# Patient Record
Sex: Male | Born: 1950 | Race: White | Hispanic: No | Marital: Married | Smoking: Current every day smoker
Health system: Southern US, Community
[De-identification: ages and names within clinical notes are randomized; demographics above are authoritative.]

## PROBLEM LIST (undated history)

## (undated) DIAGNOSIS — I509 Heart failure, unspecified: Secondary | ICD-10-CM

## (undated) DIAGNOSIS — I10 Essential (primary) hypertension: Secondary | ICD-10-CM

## (undated) DIAGNOSIS — E7800 Pure hypercholesterolemia, unspecified: Secondary | ICD-10-CM

## (undated) DIAGNOSIS — K219 Gastro-esophageal reflux disease without esophagitis: Secondary | ICD-10-CM

## (undated) DIAGNOSIS — M1990 Unspecified osteoarthritis, unspecified site: Secondary | ICD-10-CM

## (undated) DIAGNOSIS — J45909 Unspecified asthma, uncomplicated: Secondary | ICD-10-CM

## (undated) DIAGNOSIS — E119 Type 2 diabetes mellitus without complications: Secondary | ICD-10-CM

---

## 2014-02-18 DIAGNOSIS — IMO0001 Reserved for inherently not codable concepts without codable children: Secondary | ICD-10-CM | POA: Diagnosis not present

## 2014-02-18 DIAGNOSIS — I10 Essential (primary) hypertension: Secondary | ICD-10-CM | POA: Diagnosis not present

## 2016-04-08 DIAGNOSIS — E1121 Type 2 diabetes mellitus with diabetic nephropathy: Secondary | ICD-10-CM | POA: Diagnosis not present

## 2016-04-08 DIAGNOSIS — I10 Essential (primary) hypertension: Secondary | ICD-10-CM | POA: Diagnosis not present

## 2016-04-08 DIAGNOSIS — J440 Chronic obstructive pulmonary disease with acute lower respiratory infection: Secondary | ICD-10-CM | POA: Diagnosis not present

## 2016-05-10 DIAGNOSIS — R972 Elevated prostate specific antigen [PSA]: Secondary | ICD-10-CM | POA: Diagnosis not present

## 2016-05-13 DIAGNOSIS — M4802 Spinal stenosis, cervical region: Secondary | ICD-10-CM | POA: Diagnosis not present

## 2016-05-13 DIAGNOSIS — M5030 Other cervical disc degeneration, unspecified cervical region: Secondary | ICD-10-CM | POA: Diagnosis not present

## 2016-05-13 DIAGNOSIS — Z79899 Other long term (current) drug therapy: Secondary | ICD-10-CM | POA: Diagnosis not present

## 2016-05-13 DIAGNOSIS — M47812 Spondylosis without myelopathy or radiculopathy, cervical region: Secondary | ICD-10-CM | POA: Diagnosis not present

## 2016-05-13 DIAGNOSIS — J449 Chronic obstructive pulmonary disease, unspecified: Secondary | ICD-10-CM | POA: Diagnosis not present

## 2016-05-20 DIAGNOSIS — E11319 Type 2 diabetes mellitus with unspecified diabetic retinopathy without macular edema: Secondary | ICD-10-CM | POA: Diagnosis not present

## 2016-05-20 DIAGNOSIS — R3915 Urgency of urination: Secondary | ICD-10-CM | POA: Diagnosis not present

## 2016-05-20 DIAGNOSIS — R972 Elevated prostate specific antigen [PSA]: Secondary | ICD-10-CM | POA: Diagnosis not present

## 2016-06-10 DIAGNOSIS — H2513 Age-related nuclear cataract, bilateral: Secondary | ICD-10-CM | POA: Diagnosis not present

## 2016-06-11 DIAGNOSIS — M47812 Spondylosis without myelopathy or radiculopathy, cervical region: Secondary | ICD-10-CM | POA: Diagnosis not present

## 2016-06-11 DIAGNOSIS — M4802 Spinal stenosis, cervical region: Secondary | ICD-10-CM | POA: Diagnosis not present

## 2016-06-11 DIAGNOSIS — Z79899 Other long term (current) drug therapy: Secondary | ICD-10-CM | POA: Diagnosis not present

## 2016-06-11 DIAGNOSIS — J449 Chronic obstructive pulmonary disease, unspecified: Secondary | ICD-10-CM | POA: Diagnosis not present

## 2016-06-11 DIAGNOSIS — E119 Type 2 diabetes mellitus without complications: Secondary | ICD-10-CM | POA: Diagnosis not present

## 2016-06-27 DIAGNOSIS — R972 Elevated prostate specific antigen [PSA]: Secondary | ICD-10-CM | POA: Diagnosis not present

## 2016-07-05 DIAGNOSIS — R972 Elevated prostate specific antigen [PSA]: Secondary | ICD-10-CM | POA: Diagnosis not present

## 2016-07-05 DIAGNOSIS — R3915 Urgency of urination: Secondary | ICD-10-CM | POA: Diagnosis not present

## 2016-07-08 DIAGNOSIS — J45909 Unspecified asthma, uncomplicated: Secondary | ICD-10-CM | POA: Diagnosis not present

## 2016-07-08 DIAGNOSIS — M1990 Unspecified osteoarthritis, unspecified site: Secondary | ICD-10-CM | POA: Diagnosis not present

## 2016-07-08 DIAGNOSIS — Z794 Long term (current) use of insulin: Secondary | ICD-10-CM | POA: Diagnosis not present

## 2016-07-08 DIAGNOSIS — I10 Essential (primary) hypertension: Secondary | ICD-10-CM | POA: Diagnosis not present

## 2016-07-08 DIAGNOSIS — E119 Type 2 diabetes mellitus without complications: Secondary | ICD-10-CM | POA: Diagnosis not present

## 2016-07-08 DIAGNOSIS — Z79899 Other long term (current) drug therapy: Secondary | ICD-10-CM | POA: Diagnosis not present

## 2016-07-08 DIAGNOSIS — M810 Age-related osteoporosis without current pathological fracture: Secondary | ICD-10-CM | POA: Diagnosis not present

## 2016-07-09 ENCOUNTER — Encounter (HOSPITAL_COMMUNITY): Payer: Medicare Other

## 2016-07-12 DIAGNOSIS — I10 Essential (primary) hypertension: Secondary | ICD-10-CM | POA: Diagnosis not present

## 2016-07-12 DIAGNOSIS — E1121 Type 2 diabetes mellitus with diabetic nephropathy: Secondary | ICD-10-CM | POA: Diagnosis not present

## 2016-07-12 DIAGNOSIS — J440 Chronic obstructive pulmonary disease with acute lower respiratory infection: Secondary | ICD-10-CM | POA: Diagnosis not present

## 2016-08-07 NOTE — Patient Instructions (Signed)
Your procedure is scheduled on: 08/16/2016  Report to [facility] at  76  AM.  Call this number if you have problems the morning of surgery: [phone]   Do not eat food or drink liquids :After Midnight.      Take these medicines the morning of surgery with A SIP OF WATER: none   Do not wear jewelry, make-up or nail polish.  Do not wear lotions, powders, or perfumes. You may wear deodorant.  Do not shave 48 hours prior to surgery.  Do not bring valuables to the hospital.  Contacts, dentures or bridgework may not be worn into surgery.  Leave suitcase in the car. After surgery it may be brought to your room.  For patients admitted to the hospital, checkout time is 11:00 AM the day of discharge.   Patients discharged the day of surgery will not be allowed to drive home.  :     Please read over the following fact sheets that you were given: Coughing and Deep Breathing, Surgical Site Infection Prevention, Anesthesia Post-op Instructions and Care and Recovery After Surgery    Cataract A cataract is a clouding of the lens of the eye. When a lens becomes cloudy, vision is reduced based on the degree and nature of the clouding. Many cataracts reduce vision to some degree. Some cataracts make people more near-sighted as they develop. Other cataracts increase glare. Cataracts that are ignored and become worse can sometimes look white. The white color can be seen through the pupil. CAUSES   Aging. However, cataracts may occur at any age, even in newborns.   Certain drugs.   Trauma to the eye.   Certain diseases such as diabetes.   Specific eye diseases such as chronic inflammation inside the eye or a sudden attack of a rare form of glaucoma.   Inherited or acquired medical problems.  SYMPTOMS   Gradual, progressive drop in vision in the affected eye.   Severe, rapid visual loss. This most often happens when trauma is the cause.  DIAGNOSIS  To detect a cataract, an eye doctor examines  the lens. Cataracts are best diagnosed with an exam of the eyes with the pupils enlarged (dilated) by drops.  TREATMENT  For an early cataract, vision may improve by using different eyeglasses or stronger lighting. If that does not help your vision, surgery is the only effective treatment. A cataract needs to be surgically removed when vision loss interferes with your everyday activities, such as driving, reading, or watching TV. A cataract may also have to be removed if it prevents examination or treatment of another eye problem. Surgery removes the cloudy lens and usually replaces it with a substitute lens (intraocular lens, IOL).  At a time when both you and your doctor agree, the cataract will be surgically removed. If you have cataracts in both eyes, only one is usually removed at a time. This allows the operated eye to heal and be out of danger from any possible problems after surgery (such as infection or poor wound healing). In rare cases, a cataract may be doing damage to your eye. In these cases, your caregiver may advise surgical removal right away. The vast majority of people who have cataract surgery have better vision afterward. HOME CARE INSTRUCTIONS  If you are not planning surgery, you may be asked to do the following:  Use different eyeglasses.   Use stronger or brighter lighting.   Ask your eye doctor about reducing your medicine dose or  changing medicines if it is thought that a medicine caused your cataract. Changing medicines does not make the cataract go away on its own.   Become familiar with your surroundings. Poor vision can lead to injury. Avoid bumping into things on the affected side. You are at a higher risk for tripping or falling.   Exercise extreme care when driving or operating machinery.   Wear sunglasses if you are sensitive to bright light or experiencing problems with glare.  SEEK IMMEDIATE MEDICAL CARE IF:   You have a worsening or sudden vision loss.    You notice redness, swelling, or increasing pain in the eye.   You have a fever.  Document Released: 07/15/2005 Document Revised: 07/04/2011 Document Reviewed: 03/08/2011 [facility] Patient Information 2012 [place].PATIENT INSTRUCTIONS POST-ANESTHESIA  IMMEDIATELY FOLLOWING SURGERY:  Do not drive or operate machinery for the first twenty four hours after surgery.  Do not make any important decisions for twenty four hours after surgery or while taking narcotic pain medications or sedatives.  If you develop intractable nausea and vomiting or a severe headache please notify your doctor immediately.  FOLLOW-UP:  Please make an appointment with your surgeon as instructed. You do not need to follow up with anesthesia unless specifically instructed to do so.  WOUND CARE INSTRUCTIONS (if applicable):  Keep a dry clean dressing on the anesthesia/puncture wound site if there is drainage.  Once the wound has quit draining you may leave it open to air.  Generally you should leave the bandage intact for twenty four hours unless there is drainage.  If the epidural site drains for more than 36-48 hours please call the anesthesia department.  QUESTIONS?:  Please feel free to call your physician or the hospital operator if you have any questions, and they will be happy to assist you.

## 2016-08-12 ENCOUNTER — Encounter (HOSPITAL_COMMUNITY): Payer: Self-pay

## 2016-08-12 ENCOUNTER — Encounter (HOSPITAL_COMMUNITY)
Admission: RE | Admit: 2016-08-12 | Discharge: 2016-08-12 | Disposition: A | Discharge status: Home / Self Care | Payer: Medicare Other | Source: Ambulatory Visit | Attending: Ophthalmology | Admitting: Ophthalmology

## 2016-08-12 ENCOUNTER — Other Ambulatory Visit: Payer: Self-pay

## 2016-08-12 DIAGNOSIS — E1121 Type 2 diabetes mellitus with diabetic nephropathy: Secondary | ICD-10-CM | POA: Diagnosis not present

## 2016-08-12 DIAGNOSIS — Z01812 Encounter for preprocedural laboratory examination: Secondary | ICD-10-CM | POA: Diagnosis not present

## 2016-08-12 DIAGNOSIS — Z01810 Encounter for preprocedural cardiovascular examination: Secondary | ICD-10-CM | POA: Diagnosis not present

## 2016-08-12 DIAGNOSIS — J440 Chronic obstructive pulmonary disease with acute lower respiratory infection: Secondary | ICD-10-CM | POA: Diagnosis not present

## 2016-08-12 DIAGNOSIS — I10 Essential (primary) hypertension: Secondary | ICD-10-CM | POA: Diagnosis not present

## 2016-08-12 DIAGNOSIS — I480 Paroxysmal atrial fibrillation: Secondary | ICD-10-CM | POA: Diagnosis not present

## 2016-08-12 HISTORY — DX: Unspecified osteoarthritis, unspecified site: M19.90

## 2016-08-12 HISTORY — DX: Essential (primary) hypertension: I10

## 2016-08-12 HISTORY — DX: Unspecified asthma, uncomplicated: J45.909

## 2016-08-12 HISTORY — DX: Pure hypercholesterolemia, unspecified: E78.00

## 2016-08-12 HISTORY — DX: Gastro-esophageal reflux disease without esophagitis: K21.9

## 2016-08-12 HISTORY — DX: Type 2 diabetes mellitus without complications: E11.9

## 2016-08-12 LAB — BASIC METABOLIC PANEL
ANION GAP: 7 (ref 5–15)
BUN: 21 mg/dL — ABNORMAL HIGH (ref 6–20)
CALCIUM: 9.3 mg/dL (ref 8.9–10.3)
CHLORIDE: 100 mmol/L — AB (ref 101–111)
CO2: 27 mmol/L (ref 22–32)
CREATININE: 1.15 mg/dL (ref 0.61–1.24)
GFR calc non Af Amer: >60 mL/min (ref >60)
GLUCOSE: 141 mg/dL — AB (ref 65–99)
Potassium: 4.1 mmol/L (ref 3.5–5.1)
Sodium: 134 mmol/L — ABNORMAL LOW (ref 135–145)

## 2016-08-12 LAB — CBC
HCT: 39.3 % (ref 39.0–52.0)
HEMOGLOBIN: 12.5 g/dL — AB (ref 13.0–17.0)
MCH: 20.5 pg — ABNORMAL LOW (ref 26.0–34.0)
MCHC: 31.8 g/dL (ref 30.0–36.0)
MCV: 64.3 fL — AB (ref 78.0–100.0)
Platelets: 253 10*3/uL (ref 150–400)
RBC: 6.11 MIL/uL — ABNORMAL HIGH (ref 4.22–5.81)
RDW: 16 % — AB (ref 11.5–15.5)
WBC: 9.9 10*3/uL (ref 4.0–10.5)

## 2016-08-12 NOTE — Pre-Procedure Instructions (Signed)
Patient in for PAT. 98.5-113-118 HR, 20 RR, BP 123/80. Sat 97%. Patient denies SOB or feeling bad or different at all. EKG shows AFib with rapid ventricular response rate of 114. Patient is not aware of this from past visits with PCP and is currently on no meds to treat this. EKG show to Dr Patsey Berthold who states patient needs to be seen and treated prior to surgery. Called PCP, Dr Sherrie Sport who wants patient to come to office right now. Patient  verbalizes understanding of AFib and reasons for needing treatment as well as cancelling surgery for now. Copies of today's EKG given to patient t to take to PCP. He understands to call Dr Bernarda Caffey office back once he has been treated for this and PCP clears him to have surgery. Marylin Crosby at Dr Bernarda Caffey office left message on voicemail about these events and left my name and number for him to call us.

## 2016-08-16 ENCOUNTER — Encounter (HOSPITAL_COMMUNITY): Admission: RE | Payer: Self-pay | Source: Ambulatory Visit

## 2016-08-16 ENCOUNTER — Ambulatory Visit (HOSPITAL_COMMUNITY): Admission: RE | Admit: 2016-08-16 | Payer: Medicare Other | Source: Ambulatory Visit | Admitting: Ophthalmology

## 2016-08-16 SURGERY — PHACOEMULSIFICATION, CATARACT, WITH IOL INSERTION
Anesthesia: Monitor Anesthesia Care | Laterality: Right

## 2016-08-19 DIAGNOSIS — I517 Cardiomegaly: Secondary | ICD-10-CM | POA: Diagnosis not present

## 2016-08-19 DIAGNOSIS — I480 Paroxysmal atrial fibrillation: Secondary | ICD-10-CM | POA: Diagnosis not present

## 2016-08-19 DIAGNOSIS — I340 Nonrheumatic mitral (valve) insufficiency: Secondary | ICD-10-CM | POA: Diagnosis not present

## 2016-08-26 DIAGNOSIS — I480 Paroxysmal atrial fibrillation: Secondary | ICD-10-CM | POA: Diagnosis not present

## 2016-08-27 ENCOUNTER — Other Ambulatory Visit (HOSPITAL_COMMUNITY): Payer: Medicare Other

## 2016-08-30 ENCOUNTER — Ambulatory Visit (HOSPITAL_COMMUNITY): Admission: RE | Admit: 2016-08-30 | Payer: Medicare Other | Source: Ambulatory Visit | Admitting: Ophthalmology

## 2016-08-30 ENCOUNTER — Encounter (HOSPITAL_COMMUNITY): Admission: RE | Payer: Self-pay | Source: Ambulatory Visit

## 2016-08-30 SURGERY — PHACOEMULSIFICATION, CATARACT, WITH IOL INSERTION
Anesthesia: Monitor Anesthesia Care | Laterality: Left

## 2016-09-09 DIAGNOSIS — M4802 Spinal stenosis, cervical region: Secondary | ICD-10-CM | POA: Diagnosis not present

## 2016-09-09 DIAGNOSIS — M47812 Spondylosis without myelopathy or radiculopathy, cervical region: Secondary | ICD-10-CM | POA: Diagnosis not present

## 2016-09-09 DIAGNOSIS — Z79899 Other long term (current) drug therapy: Secondary | ICD-10-CM | POA: Diagnosis not present

## 2016-09-09 DIAGNOSIS — J449 Chronic obstructive pulmonary disease, unspecified: Secondary | ICD-10-CM | POA: Diagnosis not present

## 2016-11-25 DIAGNOSIS — I872 Venous insufficiency (chronic) (peripheral): Secondary | ICD-10-CM | POA: Diagnosis not present

## 2016-11-25 DIAGNOSIS — Z0000 Encounter for general adult medical examination without abnormal findings: Secondary | ICD-10-CM | POA: Diagnosis not present

## 2016-11-25 DIAGNOSIS — Z1389 Encounter for screening for other disorder: Secondary | ICD-10-CM | POA: Diagnosis not present

## 2016-11-25 DIAGNOSIS — I10 Essential (primary) hypertension: Secondary | ICD-10-CM | POA: Diagnosis not present

## 2016-11-25 DIAGNOSIS — I480 Paroxysmal atrial fibrillation: Secondary | ICD-10-CM | POA: Diagnosis not present

## 2016-11-25 DIAGNOSIS — E1121 Type 2 diabetes mellitus with diabetic nephropathy: Secondary | ICD-10-CM | POA: Diagnosis not present

## 2016-11-25 DIAGNOSIS — J440 Chronic obstructive pulmonary disease with acute lower respiratory infection: Secondary | ICD-10-CM | POA: Diagnosis not present

## 2016-12-09 DIAGNOSIS — J449 Chronic obstructive pulmonary disease, unspecified: Secondary | ICD-10-CM | POA: Diagnosis not present

## 2016-12-09 DIAGNOSIS — M4802 Spinal stenosis, cervical region: Secondary | ICD-10-CM | POA: Diagnosis not present

## 2016-12-09 DIAGNOSIS — M47812 Spondylosis without myelopathy or radiculopathy, cervical region: Secondary | ICD-10-CM | POA: Diagnosis not present

## 2016-12-09 DIAGNOSIS — E119 Type 2 diabetes mellitus without complications: Secondary | ICD-10-CM | POA: Diagnosis not present

## 2016-12-09 DIAGNOSIS — Z79899 Other long term (current) drug therapy: Secondary | ICD-10-CM | POA: Diagnosis not present

## 2017-02-25 DIAGNOSIS — I480 Paroxysmal atrial fibrillation: Secondary | ICD-10-CM | POA: Diagnosis not present

## 2017-02-25 DIAGNOSIS — Z0000 Encounter for general adult medical examination without abnormal findings: Secondary | ICD-10-CM | POA: Diagnosis not present

## 2017-02-25 DIAGNOSIS — Z125 Encounter for screening for malignant neoplasm of prostate: Secondary | ICD-10-CM | POA: Diagnosis not present

## 2017-02-25 DIAGNOSIS — E1121 Type 2 diabetes mellitus with diabetic nephropathy: Secondary | ICD-10-CM | POA: Diagnosis not present

## 2017-02-25 DIAGNOSIS — Z79899 Other long term (current) drug therapy: Secondary | ICD-10-CM | POA: Diagnosis not present

## 2017-02-25 DIAGNOSIS — J440 Chronic obstructive pulmonary disease with acute lower respiratory infection: Secondary | ICD-10-CM | POA: Diagnosis not present

## 2017-02-25 DIAGNOSIS — I10 Essential (primary) hypertension: Secondary | ICD-10-CM | POA: Diagnosis not present

## 2017-03-06 DIAGNOSIS — Z79899 Other long term (current) drug therapy: Secondary | ICD-10-CM | POA: Diagnosis not present

## 2017-03-06 DIAGNOSIS — M4802 Spinal stenosis, cervical region: Secondary | ICD-10-CM | POA: Diagnosis not present

## 2017-03-06 DIAGNOSIS — M47812 Spondylosis without myelopathy or radiculopathy, cervical region: Secondary | ICD-10-CM | POA: Diagnosis not present

## 2017-05-27 DIAGNOSIS — J440 Chronic obstructive pulmonary disease with acute lower respiratory infection: Secondary | ICD-10-CM | POA: Diagnosis not present

## 2017-05-27 DIAGNOSIS — I480 Paroxysmal atrial fibrillation: Secondary | ICD-10-CM | POA: Diagnosis not present

## 2017-05-27 DIAGNOSIS — I10 Essential (primary) hypertension: Secondary | ICD-10-CM | POA: Diagnosis not present

## 2017-05-27 DIAGNOSIS — E1121 Type 2 diabetes mellitus with diabetic nephropathy: Secondary | ICD-10-CM | POA: Diagnosis not present

## 2017-06-05 DIAGNOSIS — M47812 Spondylosis without myelopathy or radiculopathy, cervical region: Secondary | ICD-10-CM | POA: Diagnosis not present

## 2017-06-05 DIAGNOSIS — Z79899 Other long term (current) drug therapy: Secondary | ICD-10-CM | POA: Diagnosis not present

## 2017-08-27 DIAGNOSIS — E782 Mixed hyperlipidemia: Secondary | ICD-10-CM | POA: Diagnosis not present

## 2017-08-27 DIAGNOSIS — I480 Paroxysmal atrial fibrillation: Secondary | ICD-10-CM | POA: Diagnosis not present

## 2017-08-27 DIAGNOSIS — J440 Chronic obstructive pulmonary disease with acute lower respiratory infection: Secondary | ICD-10-CM | POA: Diagnosis not present

## 2017-08-27 DIAGNOSIS — E1165 Type 2 diabetes mellitus with hyperglycemia: Secondary | ICD-10-CM | POA: Diagnosis not present

## 2017-08-27 DIAGNOSIS — I10 Essential (primary) hypertension: Secondary | ICD-10-CM | POA: Diagnosis not present

## 2017-08-27 DIAGNOSIS — E1121 Type 2 diabetes mellitus with diabetic nephropathy: Secondary | ICD-10-CM | POA: Diagnosis not present

## 2017-08-27 DIAGNOSIS — J441 Chronic obstructive pulmonary disease with (acute) exacerbation: Secondary | ICD-10-CM | POA: Diagnosis not present

## 2017-08-27 DIAGNOSIS — Z125 Encounter for screening for malignant neoplasm of prostate: Secondary | ICD-10-CM | POA: Diagnosis not present

## 2017-09-01 DIAGNOSIS — M4802 Spinal stenosis, cervical region: Secondary | ICD-10-CM | POA: Diagnosis not present

## 2017-09-01 DIAGNOSIS — Z79899 Other long term (current) drug therapy: Secondary | ICD-10-CM | POA: Diagnosis not present

## 2017-09-01 DIAGNOSIS — M47812 Spondylosis without myelopathy or radiculopathy, cervical region: Secondary | ICD-10-CM | POA: Diagnosis not present

## 2017-09-04 DIAGNOSIS — H2513 Age-related nuclear cataract, bilateral: Secondary | ICD-10-CM | POA: Diagnosis not present

## 2017-10-01 DIAGNOSIS — H25013 Cortical age-related cataract, bilateral: Secondary | ICD-10-CM | POA: Diagnosis not present

## 2017-10-01 DIAGNOSIS — H40033 Anatomical narrow angle, bilateral: Secondary | ICD-10-CM | POA: Diagnosis not present

## 2017-10-01 DIAGNOSIS — H40013 Open angle with borderline findings, low risk, bilateral: Secondary | ICD-10-CM | POA: Diagnosis not present

## 2017-10-01 DIAGNOSIS — H2512 Age-related nuclear cataract, left eye: Secondary | ICD-10-CM | POA: Diagnosis not present

## 2017-10-01 DIAGNOSIS — H2513 Age-related nuclear cataract, bilateral: Secondary | ICD-10-CM | POA: Diagnosis not present

## 2017-10-21 DIAGNOSIS — H2512 Age-related nuclear cataract, left eye: Secondary | ICD-10-CM | POA: Diagnosis not present

## 2017-10-21 DIAGNOSIS — H21562 Pupillary abnormality, left eye: Secondary | ICD-10-CM | POA: Diagnosis not present

## 2017-10-21 DIAGNOSIS — H5703 Miosis: Secondary | ICD-10-CM | POA: Diagnosis not present

## 2017-10-21 DIAGNOSIS — H25812 Combined forms of age-related cataract, left eye: Secondary | ICD-10-CM | POA: Diagnosis not present

## 2017-11-07 DIAGNOSIS — I700 Atherosclerosis of aorta: Secondary | ICD-10-CM | POA: Diagnosis not present

## 2017-11-07 DIAGNOSIS — J441 Chronic obstructive pulmonary disease with (acute) exacerbation: Secondary | ICD-10-CM | POA: Diagnosis not present

## 2017-11-07 DIAGNOSIS — I517 Cardiomegaly: Secondary | ICD-10-CM | POA: Diagnosis not present

## 2017-11-07 DIAGNOSIS — I2510 Atherosclerotic heart disease of native coronary artery without angina pectoris: Secondary | ICD-10-CM | POA: Diagnosis not present

## 2017-11-07 DIAGNOSIS — R0602 Shortness of breath: Secondary | ICD-10-CM | POA: Diagnosis not present

## 2017-11-10 DIAGNOSIS — H2511 Age-related nuclear cataract, right eye: Secondary | ICD-10-CM | POA: Diagnosis not present

## 2017-11-10 DIAGNOSIS — H25011 Cortical age-related cataract, right eye: Secondary | ICD-10-CM | POA: Diagnosis not present

## 2017-11-13 DIAGNOSIS — R0602 Shortness of breath: Secondary | ICD-10-CM | POA: Diagnosis not present

## 2017-11-18 DIAGNOSIS — H25811 Combined forms of age-related cataract, right eye: Secondary | ICD-10-CM | POA: Diagnosis not present

## 2017-11-18 DIAGNOSIS — H2511 Age-related nuclear cataract, right eye: Secondary | ICD-10-CM | POA: Diagnosis not present

## 2017-11-18 DIAGNOSIS — H21561 Pupillary abnormality, right eye: Secondary | ICD-10-CM | POA: Diagnosis not present

## 2017-11-18 DIAGNOSIS — H5703 Miosis: Secondary | ICD-10-CM | POA: Diagnosis not present

## 2017-12-04 DIAGNOSIS — M47812 Spondylosis without myelopathy or radiculopathy, cervical region: Secondary | ICD-10-CM | POA: Diagnosis not present

## 2017-12-04 DIAGNOSIS — Z79899 Other long term (current) drug therapy: Secondary | ICD-10-CM | POA: Diagnosis not present

## 2017-12-04 DIAGNOSIS — M4802 Spinal stenosis, cervical region: Secondary | ICD-10-CM | POA: Diagnosis not present

## 2017-12-04 DIAGNOSIS — M5030 Other cervical disc degeneration, unspecified cervical region: Secondary | ICD-10-CM | POA: Diagnosis not present

## 2017-12-09 DIAGNOSIS — J441 Chronic obstructive pulmonary disease with (acute) exacerbation: Secondary | ICD-10-CM | POA: Diagnosis not present

## 2017-12-09 DIAGNOSIS — Z1389 Encounter for screening for other disorder: Secondary | ICD-10-CM | POA: Diagnosis not present

## 2017-12-09 DIAGNOSIS — I10 Essential (primary) hypertension: Secondary | ICD-10-CM | POA: Diagnosis not present

## 2017-12-09 DIAGNOSIS — Z0000 Encounter for general adult medical examination without abnormal findings: Secondary | ICD-10-CM | POA: Diagnosis not present

## 2017-12-09 DIAGNOSIS — E1121 Type 2 diabetes mellitus with diabetic nephropathy: Secondary | ICD-10-CM | POA: Diagnosis not present

## 2018-01-11 DIAGNOSIS — Z794 Long term (current) use of insulin: Secondary | ICD-10-CM | POA: Diagnosis not present

## 2018-01-11 DIAGNOSIS — J45909 Unspecified asthma, uncomplicated: Secondary | ICD-10-CM | POA: Diagnosis not present

## 2018-01-11 DIAGNOSIS — W57XXXA Bitten or stung by nonvenomous insect and other nonvenomous arthropods, initial encounter: Secondary | ICD-10-CM | POA: Diagnosis not present

## 2018-01-11 DIAGNOSIS — I10 Essential (primary) hypertension: Secondary | ICD-10-CM | POA: Diagnosis not present

## 2018-01-11 DIAGNOSIS — K219 Gastro-esophageal reflux disease without esophagitis: Secondary | ICD-10-CM | POA: Diagnosis not present

## 2018-01-11 DIAGNOSIS — Z79899 Other long term (current) drug therapy: Secondary | ICD-10-CM | POA: Diagnosis not present

## 2018-01-11 DIAGNOSIS — E119 Type 2 diabetes mellitus without complications: Secondary | ICD-10-CM | POA: Diagnosis not present

## 2018-04-02 DIAGNOSIS — M5030 Other cervical disc degeneration, unspecified cervical region: Secondary | ICD-10-CM | POA: Diagnosis not present

## 2018-04-02 DIAGNOSIS — M4802 Spinal stenosis, cervical region: Secondary | ICD-10-CM | POA: Diagnosis not present

## 2018-04-02 DIAGNOSIS — M47812 Spondylosis without myelopathy or radiculopathy, cervical region: Secondary | ICD-10-CM | POA: Diagnosis not present

## 2018-04-08 DIAGNOSIS — E1121 Type 2 diabetes mellitus with diabetic nephropathy: Secondary | ICD-10-CM | POA: Diagnosis not present

## 2018-04-08 DIAGNOSIS — I10 Essential (primary) hypertension: Secondary | ICD-10-CM | POA: Diagnosis not present

## 2018-04-08 DIAGNOSIS — Z0000 Encounter for general adult medical examination without abnormal findings: Secondary | ICD-10-CM | POA: Diagnosis not present

## 2018-04-08 DIAGNOSIS — J449 Chronic obstructive pulmonary disease, unspecified: Secondary | ICD-10-CM | POA: Diagnosis not present

## 2018-06-30 DIAGNOSIS — M4802 Spinal stenosis, cervical region: Secondary | ICD-10-CM | POA: Diagnosis not present

## 2018-06-30 DIAGNOSIS — Z79899 Other long term (current) drug therapy: Secondary | ICD-10-CM | POA: Diagnosis not present

## 2018-06-30 DIAGNOSIS — M47812 Spondylosis without myelopathy or radiculopathy, cervical region: Secondary | ICD-10-CM | POA: Diagnosis not present

## 2018-07-20 DIAGNOSIS — N182 Chronic kidney disease, stage 2 (mild): Secondary | ICD-10-CM | POA: Diagnosis not present

## 2018-07-20 DIAGNOSIS — E1121 Type 2 diabetes mellitus with diabetic nephropathy: Secondary | ICD-10-CM | POA: Diagnosis not present

## 2018-07-20 DIAGNOSIS — I10 Essential (primary) hypertension: Secondary | ICD-10-CM | POA: Diagnosis not present

## 2018-07-20 DIAGNOSIS — J449 Chronic obstructive pulmonary disease, unspecified: Secondary | ICD-10-CM | POA: Diagnosis not present

## 2018-10-12 DIAGNOSIS — M5030 Other cervical disc degeneration, unspecified cervical region: Secondary | ICD-10-CM | POA: Diagnosis not present

## 2018-10-12 DIAGNOSIS — Z79899 Other long term (current) drug therapy: Secondary | ICD-10-CM | POA: Diagnosis not present

## 2018-10-12 DIAGNOSIS — M47812 Spondylosis without myelopathy or radiculopathy, cervical region: Secondary | ICD-10-CM | POA: Diagnosis not present

## 2018-10-12 DIAGNOSIS — M4802 Spinal stenosis, cervical region: Secondary | ICD-10-CM | POA: Diagnosis not present

## 2018-11-16 DIAGNOSIS — E1121 Type 2 diabetes mellitus with diabetic nephropathy: Secondary | ICD-10-CM | POA: Diagnosis not present

## 2018-11-16 DIAGNOSIS — N182 Chronic kidney disease, stage 2 (mild): Secondary | ICD-10-CM | POA: Diagnosis not present

## 2018-11-16 DIAGNOSIS — I10 Essential (primary) hypertension: Secondary | ICD-10-CM | POA: Diagnosis not present

## 2018-11-16 DIAGNOSIS — Z1389 Encounter for screening for other disorder: Secondary | ICD-10-CM | POA: Diagnosis not present

## 2018-11-16 DIAGNOSIS — Z0000 Encounter for general adult medical examination without abnormal findings: Secondary | ICD-10-CM | POA: Diagnosis not present

## 2018-11-16 DIAGNOSIS — J449 Chronic obstructive pulmonary disease, unspecified: Secondary | ICD-10-CM | POA: Diagnosis not present

## 2019-01-07 DIAGNOSIS — J449 Chronic obstructive pulmonary disease, unspecified: Secondary | ICD-10-CM | POA: Diagnosis not present

## 2019-01-07 DIAGNOSIS — M47812 Spondylosis without myelopathy or radiculopathy, cervical region: Secondary | ICD-10-CM | POA: Diagnosis not present

## 2019-01-07 DIAGNOSIS — M4802 Spinal stenosis, cervical region: Secondary | ICD-10-CM | POA: Diagnosis not present

## 2019-01-07 DIAGNOSIS — M5030 Other cervical disc degeneration, unspecified cervical region: Secondary | ICD-10-CM | POA: Diagnosis not present

## 2019-03-09 DIAGNOSIS — I10 Essential (primary) hypertension: Secondary | ICD-10-CM | POA: Diagnosis not present

## 2019-03-09 DIAGNOSIS — N182 Chronic kidney disease, stage 2 (mild): Secondary | ICD-10-CM | POA: Diagnosis not present

## 2019-03-09 DIAGNOSIS — Z0000 Encounter for general adult medical examination without abnormal findings: Secondary | ICD-10-CM | POA: Diagnosis not present

## 2019-03-09 DIAGNOSIS — E1121 Type 2 diabetes mellitus with diabetic nephropathy: Secondary | ICD-10-CM | POA: Diagnosis not present

## 2019-03-09 DIAGNOSIS — J449 Chronic obstructive pulmonary disease, unspecified: Secondary | ICD-10-CM | POA: Diagnosis not present

## 2019-04-06 ENCOUNTER — Other Ambulatory Visit: Payer: Self-pay

## 2019-04-06 NOTE — Patient Outreach (Signed)
[facility]) Care Management  04/06/2019  Perry Hunter 1950/10/05 [id number]   Medication Adherence call to [name] Hippa Identifiers Verify spoke with patient he is past due on Benazepril 40 mg patient said he does not have any left but did not know if he is sopposed to be on this medication call Doctors office they said patient is to continue taking this medication that they will call a refill to [facility] left a massage for patient to pick up the medication from [place]. Mr. Ferrando is showing past due under [place].   [facility] Direct Dial [phone]  Fax [phone] [email or web address]

## 2019-04-08 DIAGNOSIS — M47812 Spondylosis without myelopathy or radiculopathy, cervical region: Secondary | ICD-10-CM | POA: Diagnosis not present

## 2019-04-08 DIAGNOSIS — M4802 Spinal stenosis, cervical region: Secondary | ICD-10-CM | POA: Diagnosis not present

## 2019-04-08 DIAGNOSIS — M5030 Other cervical disc degeneration, unspecified cervical region: Secondary | ICD-10-CM | POA: Diagnosis not present

## 2019-06-09 DIAGNOSIS — I10 Essential (primary) hypertension: Secondary | ICD-10-CM | POA: Diagnosis not present

## 2019-06-09 DIAGNOSIS — J449 Chronic obstructive pulmonary disease, unspecified: Secondary | ICD-10-CM | POA: Diagnosis not present

## 2019-06-09 DIAGNOSIS — N182 Chronic kidney disease, stage 2 (mild): Secondary | ICD-10-CM | POA: Diagnosis not present

## 2019-06-09 DIAGNOSIS — E1121 Type 2 diabetes mellitus with diabetic nephropathy: Secondary | ICD-10-CM | POA: Diagnosis not present

## 2019-06-09 DIAGNOSIS — L0390 Cellulitis, unspecified: Secondary | ICD-10-CM | POA: Diagnosis not present

## 2019-07-05 DIAGNOSIS — M47812 Spondylosis without myelopathy or radiculopathy, cervical region: Secondary | ICD-10-CM | POA: Diagnosis not present

## 2019-07-05 DIAGNOSIS — M4802 Spinal stenosis, cervical region: Secondary | ICD-10-CM | POA: Diagnosis not present

## 2019-07-05 DIAGNOSIS — M5030 Other cervical disc degeneration, unspecified cervical region: Secondary | ICD-10-CM | POA: Diagnosis not present

## 2019-07-05 DIAGNOSIS — Z79899 Other long term (current) drug therapy: Secondary | ICD-10-CM | POA: Diagnosis not present

## 2019-09-13 DIAGNOSIS — N182 Chronic kidney disease, stage 2 (mild): Secondary | ICD-10-CM | POA: Diagnosis not present

## 2019-09-13 DIAGNOSIS — E1121 Type 2 diabetes mellitus with diabetic nephropathy: Secondary | ICD-10-CM | POA: Diagnosis not present

## 2019-09-13 DIAGNOSIS — I10 Essential (primary) hypertension: Secondary | ICD-10-CM | POA: Diagnosis not present

## 2019-09-13 DIAGNOSIS — J449 Chronic obstructive pulmonary disease, unspecified: Secondary | ICD-10-CM | POA: Diagnosis not present

## 2019-09-13 DIAGNOSIS — Z7689 Persons encountering health services in other specified circumstances: Secondary | ICD-10-CM | POA: Diagnosis not present

## 2019-09-13 DIAGNOSIS — Z0000 Encounter for general adult medical examination without abnormal findings: Secondary | ICD-10-CM | POA: Diagnosis not present

## 2019-10-04 DIAGNOSIS — M5030 Other cervical disc degeneration, unspecified cervical region: Secondary | ICD-10-CM | POA: Diagnosis not present

## 2019-10-04 DIAGNOSIS — Z79899 Other long term (current) drug therapy: Secondary | ICD-10-CM | POA: Diagnosis not present

## 2019-10-04 DIAGNOSIS — M47812 Spondylosis without myelopathy or radiculopathy, cervical region: Secondary | ICD-10-CM | POA: Diagnosis not present

## 2019-10-04 DIAGNOSIS — M4802 Spinal stenosis, cervical region: Secondary | ICD-10-CM | POA: Diagnosis not present

## 2019-12-23 DIAGNOSIS — Z0000 Encounter for general adult medical examination without abnormal findings: Secondary | ICD-10-CM | POA: Diagnosis not present

## 2019-12-23 DIAGNOSIS — I10 Essential (primary) hypertension: Secondary | ICD-10-CM | POA: Diagnosis not present

## 2019-12-23 DIAGNOSIS — E1121 Type 2 diabetes mellitus with diabetic nephropathy: Secondary | ICD-10-CM | POA: Diagnosis not present

## 2019-12-23 DIAGNOSIS — I480 Paroxysmal atrial fibrillation: Secondary | ICD-10-CM | POA: Diagnosis not present

## 2019-12-23 DIAGNOSIS — N182 Chronic kidney disease, stage 2 (mild): Secondary | ICD-10-CM | POA: Diagnosis not present

## 2019-12-23 DIAGNOSIS — J449 Chronic obstructive pulmonary disease, unspecified: Secondary | ICD-10-CM | POA: Diagnosis not present

## 2019-12-30 DIAGNOSIS — M5030 Other cervical disc degeneration, unspecified cervical region: Secondary | ICD-10-CM | POA: Diagnosis not present

## 2019-12-30 DIAGNOSIS — M4802 Spinal stenosis, cervical region: Secondary | ICD-10-CM | POA: Diagnosis not present

## 2019-12-30 DIAGNOSIS — M47812 Spondylosis without myelopathy or radiculopathy, cervical region: Secondary | ICD-10-CM | POA: Diagnosis not present

## 2020-03-23 DIAGNOSIS — Z0000 Encounter for general adult medical examination without abnormal findings: Secondary | ICD-10-CM | POA: Diagnosis not present

## 2020-03-23 DIAGNOSIS — I10 Essential (primary) hypertension: Secondary | ICD-10-CM | POA: Diagnosis not present

## 2020-03-23 DIAGNOSIS — I480 Paroxysmal atrial fibrillation: Secondary | ICD-10-CM | POA: Diagnosis not present

## 2020-03-23 DIAGNOSIS — J449 Chronic obstructive pulmonary disease, unspecified: Secondary | ICD-10-CM | POA: Diagnosis not present

## 2020-03-23 DIAGNOSIS — N182 Chronic kidney disease, stage 2 (mild): Secondary | ICD-10-CM | POA: Diagnosis not present

## 2020-03-23 DIAGNOSIS — E1121 Type 2 diabetes mellitus with diabetic nephropathy: Secondary | ICD-10-CM | POA: Diagnosis not present

## 2020-04-06 DIAGNOSIS — Z79899 Other long term (current) drug therapy: Secondary | ICD-10-CM | POA: Diagnosis not present

## 2020-04-06 DIAGNOSIS — M47812 Spondylosis without myelopathy or radiculopathy, cervical region: Secondary | ICD-10-CM | POA: Diagnosis not present

## 2020-04-06 DIAGNOSIS — M5030 Other cervical disc degeneration, unspecified cervical region: Secondary | ICD-10-CM | POA: Diagnosis not present

## 2020-04-06 DIAGNOSIS — M4802 Spinal stenosis, cervical region: Secondary | ICD-10-CM | POA: Diagnosis not present

## 2020-06-20 DIAGNOSIS — M4802 Spinal stenosis, cervical region: Secondary | ICD-10-CM | POA: Diagnosis not present

## 2020-06-20 DIAGNOSIS — Z79899 Other long term (current) drug therapy: Secondary | ICD-10-CM | POA: Diagnosis not present

## 2020-06-20 DIAGNOSIS — M47812 Spondylosis without myelopathy or radiculopathy, cervical region: Secondary | ICD-10-CM | POA: Diagnosis not present

## 2020-06-20 DIAGNOSIS — M5030 Other cervical disc degeneration, unspecified cervical region: Secondary | ICD-10-CM | POA: Diagnosis not present

## 2020-06-28 DIAGNOSIS — E1121 Type 2 diabetes mellitus with diabetic nephropathy: Secondary | ICD-10-CM | POA: Diagnosis not present

## 2020-06-28 DIAGNOSIS — N182 Chronic kidney disease, stage 2 (mild): Secondary | ICD-10-CM | POA: Diagnosis not present

## 2020-06-28 DIAGNOSIS — I10 Essential (primary) hypertension: Secondary | ICD-10-CM | POA: Diagnosis not present

## 2020-06-28 DIAGNOSIS — I480 Paroxysmal atrial fibrillation: Secondary | ICD-10-CM | POA: Diagnosis not present

## 2020-06-28 DIAGNOSIS — J449 Chronic obstructive pulmonary disease, unspecified: Secondary | ICD-10-CM | POA: Diagnosis not present

## 2020-08-03 DIAGNOSIS — R0602 Shortness of breath: Secondary | ICD-10-CM | POA: Diagnosis not present

## 2020-08-03 DIAGNOSIS — J440 Chronic obstructive pulmonary disease with acute lower respiratory infection: Secondary | ICD-10-CM | POA: Diagnosis not present

## 2020-08-03 DIAGNOSIS — I480 Paroxysmal atrial fibrillation: Secondary | ICD-10-CM | POA: Diagnosis not present

## 2020-08-03 DIAGNOSIS — I5031 Acute diastolic (congestive) heart failure: Secondary | ICD-10-CM | POA: Diagnosis not present

## 2020-08-03 DIAGNOSIS — Z0000 Encounter for general adult medical examination without abnormal findings: Secondary | ICD-10-CM | POA: Diagnosis not present

## 2020-08-25 ENCOUNTER — Emergency Department (HOSPITAL_COMMUNITY): Payer: Medicare Other

## 2020-08-25 ENCOUNTER — Other Ambulatory Visit: Payer: Self-pay

## 2020-08-25 ENCOUNTER — Encounter (HOSPITAL_COMMUNITY): Payer: Self-pay | Admitting: *Deleted

## 2020-08-25 ENCOUNTER — Inpatient Hospital Stay (HOSPITAL_COMMUNITY)
Admission: EM | Admit: 2020-08-25 | Discharge: 2020-09-09 | DRG: 177 | Disposition: A | Discharge status: Home Health | Payer: Medicare Other | Attending: Internal Medicine | Admitting: Internal Medicine

## 2020-08-25 DIAGNOSIS — I700 Atherosclerosis of aorta: Secondary | ICD-10-CM | POA: Diagnosis not present

## 2020-08-25 DIAGNOSIS — Z794 Long term (current) use of insulin: Secondary | ICD-10-CM

## 2020-08-25 DIAGNOSIS — I517 Cardiomegaly: Secondary | ICD-10-CM | POA: Diagnosis not present

## 2020-08-25 DIAGNOSIS — I5020 Unspecified systolic (congestive) heart failure: Secondary | ICD-10-CM | POA: Diagnosis not present

## 2020-08-25 DIAGNOSIS — U071 COVID-19: Principal | ICD-10-CM | POA: Diagnosis present

## 2020-08-25 DIAGNOSIS — Z79899 Other long term (current) drug therapy: Secondary | ICD-10-CM

## 2020-08-25 DIAGNOSIS — I2510 Atherosclerotic heart disease of native coronary artery without angina pectoris: Secondary | ICD-10-CM | POA: Diagnosis present

## 2020-08-25 DIAGNOSIS — E119 Type 2 diabetes mellitus without complications: Secondary | ICD-10-CM

## 2020-08-25 DIAGNOSIS — K449 Diaphragmatic hernia without obstruction or gangrene: Secondary | ICD-10-CM | POA: Diagnosis not present

## 2020-08-25 DIAGNOSIS — I959 Hypotension, unspecified: Secondary | ICD-10-CM

## 2020-08-25 DIAGNOSIS — A419 Sepsis, unspecified organism: Secondary | ICD-10-CM | POA: Diagnosis not present

## 2020-08-25 DIAGNOSIS — J9811 Atelectasis: Secondary | ICD-10-CM | POA: Diagnosis not present

## 2020-08-25 DIAGNOSIS — I4819 Other persistent atrial fibrillation: Secondary | ICD-10-CM | POA: Diagnosis not present

## 2020-08-25 DIAGNOSIS — D649 Anemia, unspecified: Secondary | ICD-10-CM | POA: Diagnosis not present

## 2020-08-25 DIAGNOSIS — D500 Iron deficiency anemia secondary to blood loss (chronic): Secondary | ICD-10-CM | POA: Diagnosis not present

## 2020-08-25 DIAGNOSIS — I5043 Acute on chronic combined systolic (congestive) and diastolic (congestive) heart failure: Secondary | ICD-10-CM | POA: Diagnosis not present

## 2020-08-25 DIAGNOSIS — K828 Other specified diseases of gallbladder: Secondary | ICD-10-CM | POA: Diagnosis not present

## 2020-08-25 DIAGNOSIS — T502X5A Adverse effect of carbonic-anhydrase inhibitors, benzothiadiazides and other diuretics, initial encounter: Secondary | ICD-10-CM | POA: Diagnosis not present

## 2020-08-25 DIAGNOSIS — I10 Essential (primary) hypertension: Secondary | ICD-10-CM | POA: Diagnosis not present

## 2020-08-25 DIAGNOSIS — R531 Weakness: Secondary | ICD-10-CM | POA: Diagnosis present

## 2020-08-25 DIAGNOSIS — E785 Hyperlipidemia, unspecified: Secondary | ICD-10-CM

## 2020-08-25 DIAGNOSIS — E1122 Type 2 diabetes mellitus with diabetic chronic kidney disease: Secondary | ICD-10-CM | POA: Diagnosis present

## 2020-08-25 DIAGNOSIS — E669 Obesity, unspecified: Secondary | ICD-10-CM

## 2020-08-25 DIAGNOSIS — K2211 Ulcer of esophagus with bleeding: Secondary | ICD-10-CM | POA: Diagnosis not present

## 2020-08-25 DIAGNOSIS — R570 Cardiogenic shock: Secondary | ICD-10-CM | POA: Diagnosis not present

## 2020-08-25 DIAGNOSIS — E871 Hypo-osmolality and hyponatremia: Secondary | ICD-10-CM

## 2020-08-25 DIAGNOSIS — E7800 Pure hypercholesterolemia, unspecified: Secondary | ICD-10-CM | POA: Diagnosis present

## 2020-08-25 DIAGNOSIS — M7989 Other specified soft tissue disorders: Secondary | ICD-10-CM | POA: Diagnosis not present

## 2020-08-25 DIAGNOSIS — G9341 Metabolic encephalopathy: Secondary | ICD-10-CM | POA: Diagnosis not present

## 2020-08-25 DIAGNOSIS — M1990 Unspecified osteoarthritis, unspecified site: Secondary | ICD-10-CM | POA: Diagnosis not present

## 2020-08-25 DIAGNOSIS — K2951 Unspecified chronic gastritis with bleeding: Secondary | ICD-10-CM | POA: Diagnosis not present

## 2020-08-25 DIAGNOSIS — R140 Abdominal distension (gaseous): Secondary | ICD-10-CM

## 2020-08-25 DIAGNOSIS — J449 Chronic obstructive pulmonary disease, unspecified: Secondary | ICD-10-CM | POA: Diagnosis not present

## 2020-08-25 DIAGNOSIS — R309 Painful micturition, unspecified: Secondary | ICD-10-CM | POA: Diagnosis not present

## 2020-08-25 DIAGNOSIS — J811 Chronic pulmonary edema: Secondary | ICD-10-CM | POA: Diagnosis not present

## 2020-08-25 DIAGNOSIS — K2960 Other gastritis without bleeding: Secondary | ICD-10-CM | POA: Diagnosis present

## 2020-08-25 DIAGNOSIS — K2970 Gastritis, unspecified, without bleeding: Secondary | ICD-10-CM | POA: Diagnosis not present

## 2020-08-25 DIAGNOSIS — I480 Paroxysmal atrial fibrillation: Secondary | ICD-10-CM

## 2020-08-25 DIAGNOSIS — E861 Hypovolemia: Secondary | ICD-10-CM | POA: Diagnosis not present

## 2020-08-25 DIAGNOSIS — J9601 Acute respiratory failure with hypoxia: Secondary | ICD-10-CM | POA: Diagnosis not present

## 2020-08-25 DIAGNOSIS — J440 Chronic obstructive pulmonary disease with acute lower respiratory infection: Secondary | ICD-10-CM | POA: Diagnosis not present

## 2020-08-25 DIAGNOSIS — K922 Gastrointestinal hemorrhage, unspecified: Secondary | ICD-10-CM | POA: Diagnosis not present

## 2020-08-25 DIAGNOSIS — K5730 Diverticulosis of large intestine without perforation or abscess without bleeding: Secondary | ICD-10-CM | POA: Diagnosis not present

## 2020-08-25 DIAGNOSIS — Z6838 Body mass index (BMI) 38.0-38.9, adult: Secondary | ICD-10-CM | POA: Diagnosis not present

## 2020-08-25 DIAGNOSIS — R6520 Severe sepsis without septic shock: Secondary | ICD-10-CM | POA: Diagnosis not present

## 2020-08-25 DIAGNOSIS — N179 Acute kidney failure, unspecified: Secondary | ICD-10-CM | POA: Diagnosis not present

## 2020-08-25 DIAGNOSIS — J90 Pleural effusion, not elsewhere classified: Secondary | ICD-10-CM | POA: Diagnosis not present

## 2020-08-25 DIAGNOSIS — I950 Idiopathic hypotension: Secondary | ICD-10-CM | POA: Diagnosis not present

## 2020-08-25 DIAGNOSIS — K921 Melena: Secondary | ICD-10-CM | POA: Diagnosis not present

## 2020-08-25 DIAGNOSIS — E118 Type 2 diabetes mellitus with unspecified complications: Secondary | ICD-10-CM | POA: Diagnosis not present

## 2020-08-25 DIAGNOSIS — J1282 Pneumonia due to coronavirus disease 2019: Secondary | ICD-10-CM | POA: Diagnosis present

## 2020-08-25 DIAGNOSIS — R5381 Other malaise: Secondary | ICD-10-CM | POA: Diagnosis not present

## 2020-08-25 DIAGNOSIS — I5021 Acute systolic (congestive) heart failure: Secondary | ICD-10-CM | POA: Diagnosis not present

## 2020-08-25 DIAGNOSIS — K219 Gastro-esophageal reflux disease without esophagitis: Secondary | ICD-10-CM | POA: Diagnosis not present

## 2020-08-25 DIAGNOSIS — R579 Shock, unspecified: Secondary | ICD-10-CM

## 2020-08-25 DIAGNOSIS — J441 Chronic obstructive pulmonary disease with (acute) exacerbation: Secondary | ICD-10-CM | POA: Diagnosis present

## 2020-08-25 DIAGNOSIS — I509 Heart failure, unspecified: Secondary | ICD-10-CM | POA: Diagnosis not present

## 2020-08-25 DIAGNOSIS — E1169 Type 2 diabetes mellitus with other specified complication: Secondary | ICD-10-CM | POA: Diagnosis not present

## 2020-08-25 DIAGNOSIS — E782 Mixed hyperlipidemia: Secondary | ICD-10-CM | POA: Diagnosis not present

## 2020-08-25 DIAGNOSIS — N189 Chronic kidney disease, unspecified: Secondary | ICD-10-CM | POA: Diagnosis not present

## 2020-08-25 DIAGNOSIS — R195 Other fecal abnormalities: Secondary | ICD-10-CM | POA: Diagnosis not present

## 2020-08-25 DIAGNOSIS — I5082 Biventricular heart failure: Secondary | ICD-10-CM | POA: Diagnosis present

## 2020-08-25 DIAGNOSIS — K2101 Gastro-esophageal reflux disease with esophagitis, with bleeding: Secondary | ICD-10-CM | POA: Diagnosis not present

## 2020-08-25 DIAGNOSIS — R609 Edema, unspecified: Secondary | ICD-10-CM

## 2020-08-25 DIAGNOSIS — I5023 Acute on chronic systolic (congestive) heart failure: Secondary | ICD-10-CM

## 2020-08-25 DIAGNOSIS — G9340 Encephalopathy, unspecified: Secondary | ICD-10-CM | POA: Diagnosis not present

## 2020-08-25 DIAGNOSIS — F17210 Nicotine dependence, cigarettes, uncomplicated: Secondary | ICD-10-CM | POA: Diagnosis present

## 2020-08-25 DIAGNOSIS — I130 Hypertensive heart and chronic kidney disease with heart failure and stage 1 through stage 4 chronic kidney disease, or unspecified chronic kidney disease: Secondary | ICD-10-CM | POA: Diagnosis present

## 2020-08-25 DIAGNOSIS — R0600 Dyspnea, unspecified: Secondary | ICD-10-CM | POA: Diagnosis not present

## 2020-08-25 DIAGNOSIS — R338 Other retention of urine: Secondary | ICD-10-CM | POA: Diagnosis not present

## 2020-08-25 DIAGNOSIS — J45909 Unspecified asthma, uncomplicated: Secondary | ICD-10-CM | POA: Diagnosis not present

## 2020-08-25 DIAGNOSIS — N184 Chronic kidney disease, stage 4 (severe): Secondary | ICD-10-CM | POA: Diagnosis not present

## 2020-08-25 DIAGNOSIS — I110 Hypertensive heart disease with heart failure: Secondary | ICD-10-CM | POA: Diagnosis not present

## 2020-08-25 DIAGNOSIS — D509 Iron deficiency anemia, unspecified: Secondary | ICD-10-CM

## 2020-08-25 DIAGNOSIS — J42 Unspecified chronic bronchitis: Secondary | ICD-10-CM | POA: Diagnosis not present

## 2020-08-25 DIAGNOSIS — I4891 Unspecified atrial fibrillation: Secondary | ICD-10-CM | POA: Diagnosis not present

## 2020-08-25 DIAGNOSIS — R6521 Severe sepsis with septic shock: Secondary | ICD-10-CM | POA: Diagnosis not present

## 2020-08-25 DIAGNOSIS — R0602 Shortness of breath: Secondary | ICD-10-CM | POA: Diagnosis not present

## 2020-08-25 DIAGNOSIS — N170 Acute kidney failure with tubular necrosis: Secondary | ICD-10-CM | POA: Diagnosis not present

## 2020-08-25 DIAGNOSIS — K2210 Ulcer of esophagus without bleeding: Secondary | ICD-10-CM | POA: Diagnosis not present

## 2020-08-25 DIAGNOSIS — D62 Acute posthemorrhagic anemia: Secondary | ICD-10-CM | POA: Diagnosis present

## 2020-08-25 DIAGNOSIS — I313 Pericardial effusion (noninflammatory): Secondary | ICD-10-CM | POA: Diagnosis not present

## 2020-08-25 DIAGNOSIS — R7989 Other specified abnormal findings of blood chemistry: Secondary | ICD-10-CM

## 2020-08-25 DIAGNOSIS — K3189 Other diseases of stomach and duodenum: Secondary | ICD-10-CM | POA: Diagnosis not present

## 2020-08-25 DIAGNOSIS — K254 Chronic or unspecified gastric ulcer with hemorrhage: Secondary | ICD-10-CM | POA: Diagnosis not present

## 2020-08-25 DIAGNOSIS — R198 Other specified symptoms and signs involving the digestive system and abdomen: Secondary | ICD-10-CM | POA: Diagnosis not present

## 2020-08-25 DIAGNOSIS — K2901 Acute gastritis with bleeding: Secondary | ICD-10-CM | POA: Diagnosis not present

## 2020-08-25 DIAGNOSIS — N401 Enlarged prostate with lower urinary tract symptoms: Secondary | ICD-10-CM | POA: Diagnosis present

## 2020-08-25 HISTORY — DX: Heart failure, unspecified: I50.9

## 2020-08-25 LAB — BASIC METABOLIC PANEL
Anion gap: 7 (ref 5–15)
BUN: 41 mg/dL — ABNORMAL HIGH (ref 8–23)
CO2: 26 mmol/L (ref 22–32)
Calcium: 8 mg/dL — ABNORMAL LOW (ref 8.9–10.3)
Chloride: 98 mmol/L (ref 98–111)
Creatinine, Ser: 2.75 mg/dL — ABNORMAL HIGH (ref 0.61–1.24)
GFR, Estimated: 24 mL/min — ABNORMAL LOW (ref >60)
Glucose, Bld: 94 mg/dL (ref 70–99)
Potassium: 5 mmol/L (ref 3.5–5.1)
Sodium: 131 mmol/L — ABNORMAL LOW (ref 135–145)

## 2020-08-25 LAB — CBC WITH DIFFERENTIAL/PLATELET
Abs Immature Granulocytes: 0.03 10*3/uL (ref 0.00–0.07)
Basophils Absolute: 0 10*3/uL (ref 0.0–0.1)
Basophils Relative: 1 %
Eosinophils Absolute: 0 10*3/uL (ref 0.0–0.5)
Eosinophils Relative: 0 %
HCT: 22.7 % — ABNORMAL LOW (ref 39.0–52.0)
Hemoglobin: 6.1 g/dL — CL (ref 13.0–17.0)
Immature Granulocytes: 1 %
Lymphocytes Relative: 22 %
Lymphs Abs: 1 10*3/uL (ref 0.7–4.0)
MCH: 16.2 pg — ABNORMAL LOW (ref 26.0–34.0)
MCHC: 26.9 g/dL — ABNORMAL LOW (ref 30.0–36.0)
MCV: 60.2 fL — ABNORMAL LOW (ref 80.0–100.0)
Monocytes Absolute: 0.7 10*3/uL (ref 0.1–1.0)
Monocytes Relative: 17 %
Neutro Abs: 2.6 10*3/uL (ref 1.7–7.7)
Neutrophils Relative %: 59 %
Platelets: 206 10*3/uL (ref 150–400)
RBC: 3.77 MIL/uL — ABNORMAL LOW (ref 4.22–5.81)
RDW: 19.4 % — ABNORMAL HIGH (ref 11.5–15.5)
WBC: 4.4 10*3/uL (ref 4.0–10.5)
nRBC: 2.7 % — ABNORMAL HIGH (ref 0.0–0.2)

## 2020-08-25 LAB — FERRITIN: Ferritin: 29 ng/mL (ref 24–336)

## 2020-08-25 LAB — HEPATIC FUNCTION PANEL
ALT: 76 U/L — ABNORMAL HIGH (ref 0–44)
AST: 110 U/L — ABNORMAL HIGH (ref 15–41)
Albumin: 3.3 g/dL — ABNORMAL LOW (ref 3.5–5.0)
Alkaline Phosphatase: 68 U/L (ref 38–126)
Bilirubin, Direct: 0.3 mg/dL — ABNORMAL HIGH (ref 0.0–0.2)
Indirect Bilirubin: 0.4 mg/dL (ref 0.3–0.9)
Total Bilirubin: 0.7 mg/dL (ref 0.3–1.2)
Total Protein: 6.8 g/dL (ref 6.5–8.1)

## 2020-08-25 LAB — PREPARE RBC (CROSSMATCH)

## 2020-08-25 LAB — PROTIME-INR
INR: 1.9 — ABNORMAL HIGH (ref 0.8–1.2)
Prothrombin Time: 20.9 seconds — ABNORMAL HIGH (ref 11.4–15.2)

## 2020-08-25 LAB — POC OCCULT BLOOD, ED: Fecal Occult Bld: POSITIVE — AB

## 2020-08-25 LAB — CBG MONITORING, ED: Glucose-Capillary: 76 mg/dL (ref 70–99)

## 2020-08-25 LAB — IRON AND TIBC
Iron: 13 ug/dL — ABNORMAL LOW (ref 45–182)
Saturation Ratios: 3 % — ABNORMAL LOW (ref 17.9–39.5)
TIBC: 432 ug/dL (ref 250–450)
UIBC: 419 ug/dL

## 2020-08-25 LAB — ABO/RH: ABO/RH(D): O POS

## 2020-08-25 LAB — RETICULOCYTES
Immature Retic Fract: 21.7 % — ABNORMAL HIGH (ref 2.3–15.9)
RBC.: 4 MIL/uL — ABNORMAL LOW (ref 4.22–5.81)
Retic Count, Absolute: 68.4 10*3/uL (ref 19.0–186.0)
Retic Ct Pct: 1.7 % (ref 0.4–3.1)

## 2020-08-25 LAB — TYPE AND SCREEN: Unit division: 0

## 2020-08-25 LAB — VITAMIN B12: Vitamin B-12: 378 pg/mL (ref 180–914)

## 2020-08-25 LAB — FOLATE: Folate: 10.9 ng/mL (ref >5.9)

## 2020-08-25 LAB — BRAIN NATRIURETIC PEPTIDE: B Natriuretic Peptide: 527 pg/mL — ABNORMAL HIGH (ref 0.0–100.0)

## 2020-08-25 LAB — SARS CORONAVIRUS 2 BY RT PCR (HOSPITAL ORDER, PERFORMED IN ~~LOC~~ HOSPITAL LAB): SARS Coronavirus 2: POSITIVE — AB

## 2020-08-25 MED ORDER — SODIUM CHLORIDE 0.9 % IV SOLN
250.0000 mL | INTRAVENOUS | Status: DC
  Administered 2020-08-29 – 2020-09-02 (×2): 250 mL via INTRAVENOUS

## 2020-08-25 MED ORDER — METHYLPREDNISOLONE SODIUM SUCC 125 MG IJ SOLR
0.5000 mg/kg | Freq: Two times a day (BID) | INTRAMUSCULAR | Status: DC
  Administered 2020-08-25 – 2020-08-26 (×2): 56.875 mg via INTRAVENOUS
  Filled 2020-08-25 (×2): qty 2

## 2020-08-25 MED ORDER — ZINC SULFATE 220 (50 ZN) MG PO CAPS
220.0000 mg | ORAL_CAPSULE | Freq: Every day | ORAL | Status: DC
  Administered 2020-08-26 – 2020-09-09 (×15): 220 mg via ORAL
  Filled 2020-08-25 (×15): qty 1

## 2020-08-25 MED ORDER — LINAGLIPTIN 5 MG PO TABS: 5.0000 mg | ORAL_TABLET | Freq: Every day | ORAL | Status: DC

## 2020-08-25 MED ORDER — FUROSEMIDE 10 MG/ML IJ SOLN: 40.0000 mg | Freq: Once | INTRAMUSCULAR | Status: DC

## 2020-08-25 MED ORDER — DM-GUAIFENESIN ER 30-600 MG PO TB12
1.0000 | ORAL_TABLET | Freq: Two times a day (BID) | ORAL | Status: DC
  Administered 2020-08-25 – 2020-09-05 (×22): 1 via ORAL
  Filled 2020-08-25 (×22): qty 1

## 2020-08-25 MED ORDER — FUROSEMIDE 10 MG/ML IJ SOLN
20.0000 mg | Freq: Once | INTRAMUSCULAR | Status: AC
  Administered 2020-08-26: 20 mg via INTRAVENOUS
  Filled 2020-08-25: qty 2

## 2020-08-25 MED ORDER — INSULIN DETEMIR 100 UNIT/ML ~~LOC~~ SOLN
0.0750 [IU]/kg | Freq: Two times a day (BID) | SUBCUTANEOUS | Status: DC
  Administered 2020-08-25 – 2020-08-28 (×5): 9 [IU] via SUBCUTANEOUS
  Filled 2020-08-25 (×12): qty 0.09

## 2020-08-25 MED ORDER — GUAIFENESIN-DM 100-10 MG/5ML PO SYRP
10.0000 mL | ORAL_SOLUTION | ORAL | Status: DC | PRN
  Administered 2020-09-03: 10 mL via ORAL
  Filled 2020-08-25: qty 10

## 2020-08-25 MED ORDER — PANTOPRAZOLE SODIUM 40 MG IV SOLR
40.0000 mg | Freq: Two times a day (BID) | INTRAVENOUS | Status: DC
  Administered 2020-08-26 – 2020-08-28 (×5): 40 mg via INTRAVENOUS
  Filled 2020-08-25 (×5): qty 40

## 2020-08-25 MED ORDER — ASCORBIC ACID 500 MG PO TABS
500.0000 mg | ORAL_TABLET | Freq: Every day | ORAL | Status: DC
  Administered 2020-08-26 – 2020-09-09 (×15): 500 mg via ORAL
  Filled 2020-08-25 (×17): qty 1

## 2020-08-25 MED ORDER — PREDNISONE 20 MG PO TABS: 50.0000 mg | ORAL_TABLET | Freq: Every day | ORAL | Status: DC

## 2020-08-25 MED ORDER — SODIUM CHLORIDE 0.9 % IV SOLN
10.0000 mL/h | Freq: Once | INTRAVENOUS | Status: AC
  Administered 2020-08-25: 10 mL/h via INTRAVENOUS

## 2020-08-25 MED ORDER — ACETAMINOPHEN 325 MG PO TABS: 650.0000 mg | ORAL_TABLET | Freq: Four times a day (QID) | ORAL | Status: DC | PRN

## 2020-08-25 MED ORDER — INSULIN ASPART 100 UNIT/ML ~~LOC~~ SOLN
3.0000 [IU] | Freq: Three times a day (TID) | SUBCUTANEOUS | Status: DC
  Administered 2020-08-26 – 2020-08-28 (×3): 3 [IU] via SUBCUTANEOUS
  Filled 2020-08-25: qty 1

## 2020-08-25 MED ORDER — HYDROCOD POLST-CPM POLST ER 10-8 MG/5ML PO SUER: 5.0000 mL | Freq: Two times a day (BID) | ORAL | Status: DC | PRN

## 2020-08-25 MED ORDER — PANTOPRAZOLE SODIUM 40 MG IV SOLR
40.0000 mg | Freq: Once | INTRAVENOUS | Status: AC
  Administered 2020-08-25: 40 mg via INTRAVENOUS
  Filled 2020-08-25: qty 40

## 2020-08-25 MED ORDER — INSULIN ASPART 100 UNIT/ML ~~LOC~~ SOLN
0.0000 [IU] | Freq: Three times a day (TID) | SUBCUTANEOUS | Status: DC
  Administered 2020-08-26: 2 [IU] via SUBCUTANEOUS
  Administered 2020-08-26 (×2): 1 [IU] via SUBCUTANEOUS
  Administered 2020-08-27: 2 [IU] via SUBCUTANEOUS
  Administered 2020-08-27: 08:00:00 1 [IU] via SUBCUTANEOUS
  Administered 2020-08-28 – 2020-08-29 (×3): 2 [IU] via SUBCUTANEOUS
  Administered 2020-08-31 – 2020-09-01 (×2): 1 [IU] via SUBCUTANEOUS
  Administered 2020-09-01: 12:00:00 2 [IU] via SUBCUTANEOUS
  Administered 2020-09-01: 1 [IU] via SUBCUTANEOUS
  Administered 2020-09-02 (×2): 2 [IU] via SUBCUTANEOUS
  Administered 2020-09-02 – 2020-09-03 (×3): 1 [IU] via SUBCUTANEOUS
  Administered 2020-09-04: 18:00:00 2 [IU] via SUBCUTANEOUS
  Administered 2020-09-04 – 2020-09-06 (×5): 1 [IU] via SUBCUTANEOUS
  Administered 2020-09-07: 3 [IU] via SUBCUTANEOUS
  Administered 2020-09-07: 2 [IU] via SUBCUTANEOUS
  Administered 2020-09-08: 1 [IU] via SUBCUTANEOUS
  Filled 2020-08-25 (×3): qty 1

## 2020-08-25 MED ORDER — INSULIN ASPART 100 UNIT/ML ~~LOC~~ SOLN: 0.0000 [IU] | Freq: Every day | SUBCUTANEOUS | Status: DC

## 2020-08-25 MED ORDER — ALBUTEROL SULFATE HFA 108 (90 BASE) MCG/ACT IN AERS
2.0000 | INHALATION_SPRAY | Freq: Four times a day (QID) | RESPIRATORY_TRACT | Status: DC
  Administered 2020-08-26 – 2020-09-08 (×49): 2 via RESPIRATORY_TRACT
  Filled 2020-08-25 (×2): qty 6.7

## 2020-08-25 NOTE — H&P (Signed)
History and Physical  Perry Hunter [id number] DOB: 24-Sep-1950 DOA: 08/25/2020  Referring physician: Milton Ferguson, MD PCP: Perry Burly, MD  Patient coming from: Home  Chief Complaint: Leg swelling  HPI: Perry Hunter is a 70 y.o. male with medical history significant for hypertension, hyperlipidemia, T2DM, GERD, COPD (not on home oxygen) and obesity who presents to the emergency department due to 2-3 month onset of increasing bilateral leg swelling.  Patient complained of progressive leg swelling including abdominal girth associated with increasing shortness of breath that worsens on exertion as well as cough with occasional duction of greenish phlegm.  He endorsed about 1 month of black stools.  Patient denies chest pain, fever, chills, nausea or vomiting.  Patient states that his daughter, granddaughter and PCP insist that he should go to the ED for evaluation regarding his condition.  ED Course:  In the emergency department, he was hypotensive with BP of 70/51, O2 sat was 92% on room air.  Work-up in the ED showed H/H 6.1/22.7 (most recent labs for comparison was 4 years ago with hemoglobin of 12.5), hyponatremia, BUN to creatinine 41/2.75 (most recent labs for comparison was 4 years ago with creatinine of 1.15).  BNP 527.  Occult blood was positive per ED physician.  SARS coronavirus 2 was positive. Chest x-ray showed mild opacity at the base of the right lower lobe, most likely atelectasis. Possible small right pleural effusion. Pneumonia is not excluded. No other evidence of acute cardiopulmonary disease.  Mild cardiomegaly with no evidence of congestive heart failure. Type and screen was done with plan to transfuse 2 units of PRBC, IV Protonix 40 mg x 1 was indicated.  Hospitalist was asked to admit patient for further evaluation and management.  Review of Systems: Constitutional: Negative for chills and fever.  HENT: Negative for ear pain and sore throat.   Eyes: Negative for  pain and visual disturbance.  Respiratory: Positive for cough and shortness of breath.   Cardiovascular: Negative for chest pain and palpitations.  Gastrointestinal: Positive for increased abdominal girth.  Negative for vomiting.  Endocrine: Negative for polyphagia and polyuria.  Genitourinary: Negative for decreased urine volume, dysuria, enuresis Musculoskeletal: Negative for arthralgias and back pain.  Skin: Negative for color change and rash.  Allergic/Immunologic: Negative for immunocompromised state.  Neurological: Positive for weakness.  Negative for tremors, syncope, speech difficulty Hematological: Does not bruise/bleed easily.  All other systems reviewed and are negative   Past Medical History:  Diagnosis Date  . Arthritis   . Asthma   . CHF (congestive heart failure) ([place])   . Diabetes mellitus without complication ([place])   . GERD (gastroesophageal reflux disease)   . Hypercholesteremia   . Hypertension    History reviewed. No pertinent surgical history.  Social History:  reports that he has been smoking cigarettes. He has never used smokeless tobacco. He reports previous alcohol use. He reports that he does not use drugs.   No Known Allergies  History reviewed. No pertinent family history.   Prior to Admission medications   Medication Sig Start Date End Date Taking? Authorizing Provider  acetaminophen (TYLENOL) 650 MG CR tablet Take 650 mg by mouth every 8 (eight) hours as needed for pain.    [provider]  benazepril (LOTENSIN) 40 MG tablet Take 40 mg by mouth daily.    [provider]  Cholecalciferol (VITAMIN D3) 1000 units CAPS Take 1 capsule by mouth daily.    [provider]  fluticasone (FLOVENT HFA) 220 MCG/ACT  inhaler Inhale 1 puff into the lungs 2 (two) times daily.    [provider]  hydrochlorothiazide (HYDRODIURIL) 25 MG tablet Take 25 mg by mouth daily.    [provider]  HYDROcodone-acetaminophen  (NORCO/VICODIN) 5-325 MG tablet Take 1 tablet by mouth 3 (three) times daily as needed for pain. 06/12/16   [provider]  insulin detemir (LEVEMIR) 100 UNIT/ML injection Inject 40 Units into the skin at bedtime.    [provider]  metFORMIN (GLUCOPHAGE) 500 MG tablet Take 500 mg by mouth 2 (two) times daily with a meal.    [provider]  Omega-3 Fatty Acids (FISH OIL) 1000 MG CAPS Take 1 capsule by mouth daily.    [provider]  omeprazole (PRILOSEC) 40 MG capsule Take 40 mg by mouth daily as needed (acid reflux).    [provider]  PROAIR HFA [phone] Base) MCG/ACT inhaler Inhale 2 puffs into the lungs 4 (four) times daily as needed for shortness of breath. 06/28/16   [provider]  simvastatin (ZOCOR) 10 MG tablet Take 10 mg by mouth daily at 6 PM.    [provider]  sitaGLIPtin (JANUVIA) 100 MG tablet Take 100 mg by mouth daily.    [provider]    Physical Exam: BP (!) 80/54   Pulse 99   Temp 98.1 F (36.7 C) (Oral)   Resp 20   Ht '5\' 8"'$  (1.727 m)   Wt 113.4 kg   SpO2 97%   BMI 38.01 kg/m   . General: 70 y.o. year-old male well developed well nourished in no acute distress.  Alert and oriented x3. Marland Kitchen HEENT: NCAT, EOMI . Neck: Supple, trachea medial . Cardiovascular: Regular rate and rhythm with no rubs or gallops.  No thyromegaly or JVD noted.  2/4 pulses in all 4 extremities. Marland Kitchen Respiratory: Diffuse expiratory wheezing.  No rales. . Abdomen: Soft, nontender but distended with normal bowel sounds x4 quadrants. . Muskuloskeletal: No cyanosis, +2 edema from feet to thighs bilaterally. . Neuro: CN II-XII intact, strength 5/5 x 4, sensation, reflexes intact . Skin: No ulcerative lesions noted or rashes . Psychiatry: Mood is appropriate for condition and setting          Labs on Admission:  Basic Metabolic Panel: Recent Labs  Lab 08/25/20 1452  NA 131*  K 5.0  CL 98  CO2 26  GLUCOSE 94  BUN  41*  CREATININE 2.75*  CALCIUM 8.0*   Liver Function Tests: Recent Labs  Lab 08/25/20 2050  AST 110*  ALT 76*  ALKPHOS 68  BILITOT 0.7  PROT 6.8  ALBUMIN 3.3*   No results for input(s): LIPASE, AMYLASE in the last 168 hours. No results for input(s): AMMONIA in the last 168 hours. CBC: Recent Labs  Lab 08/25/20 1452  WBC 4.4  NEUTROABS 2.6  HGB 6.1*  HCT 22.7*  MCV 60.2*  PLT 206   Cardiac Enzymes: No results for input(s): CKTOTAL, CKMB, CKMBINDEX, TROPONINI in the last 168 hours.  BNP (last 3 results) Recent Labs    08/25/20 1452  BNP 527.0*    ProBNP (last 3 results) No results for input(s): PROBNP in the last 8760 hours.  CBG: No results for input(s): GLUCAP in the last 168 hours.  Radiological Exams on Admission: DG Chest 2 View  Result Date: 08/25/2020 CLINICAL DATA:  Bilateral leg swelling and abdominal swelling for 3 months. History of asthma and CHF. EXAM: CHEST - 2 VIEW COMPARISON:  None.  FINDINGS: Cardiac silhouette is mildly enlarged. No mediastinal or hilar masses or evidence of adenopathy. Dense opacity with volume loss lies in the right middle lobe consistent with atelectasis. Additional opacity noted at the right lung base consistent with atelectasis possibly with a small effusion. Mild linear atelectasis at the lateral left lung base. Mild pleuroparenchymal scarring at the apices. Remainder of the lungs is clear. Elevated right hemidiaphragm. No pneumothorax. Skeletal structures are intact. IMPRESSION: 1. Opacity with volume loss the right middle lobe most consistent with atelectasis. No visualized mass although a central bronchial obstruction is not excluded radiographically. 2. Mild opacity at the base of the right lower lobe, most likely atelectasis. Possible small right pleural effusion. Pneumonia is not excluded. No other evidence of acute cardiopulmonary disease. 3. Mild cardiomegaly.  No evidence of congestive heart failure. Electronically Signed    By: Lajean Manes M.D.   On: 08/25/2020 15:19    EKG: I independently viewed the EKG done and my findings are as followed: Normal sinus rhythm at rate of 97 bpm with prolonged QTc (511 ms)  Assessment/Plan Present on Admission: . GI bleed  Principal Problem:   GI bleed Active Problems:   Symptomatic anemia   Microcytic anemia   Elevated brain natriuretic peptide (BNP) level   Hypotension   Hyponatremia   Chronic kidney disease   Essential hypertension   Hyperlipidemia   GERD (gastroesophageal reflux disease)   COPD (chronic obstructive pulmonary disease) (HCC)   Diabetes mellitus (HCC)   Obesity  Symptomatic anemia possible secondary to GI bleed H/H 6.1/22.7 (most recent labs for comparison was 4 years ago with hemoglobin of 12.5) Patient endorsed 1 month history of black stool Fecal occult blood was positive Type and crossmatch was done in the ED with plan to transfuse 2 units of PRBC   IV Protonix was ordered in the ED, we shall continue with Protonix Gastroenterologist will be consulted in the morning  Hypotension in the setting of above/presumed CHF 2 units of PRBC will be transfused as described above Temporary peripheral Neo-Synephrine was started to maintain a MAP of 65 and above  Acute respiratory failure with hypoxia due to COVID-19 virus infection Continue albuterol q.6h Continue IV Solu-Medrol per pharmacy dosing Patient is out of window for benefit from remdesivir Continue vitamin-C 500 mg p.o. Daily Continue zinc 220 mg p.o. Daily Continue Mucinex, Robitussin and Tussionex Continue Tylenol p.r.n. for fever Continue supplemental oxygen to maintain O2 sat > or = 93% with plan to wean patient off supplemental oxygen as tolerated (of note, patient does not use oxygen at baseline) Continue incentive spirometry and flutter valve q44mn as tolerated Encourage proning, early ambulation, and side laying as tolerated Continue airborne isolation precaution Continue  monitoring daily inflammatory markers Physician PPE:  Surgical mask with face shield, N-95, nonsterile gloves, disposable gown, head and shoe cover s Patient PPE:  Face mask   Elevated BNP R/O CHF BNP 527, this may be unreliable considering elevated creatinine, but due to clinical presentation, including shortness of breath on exertion, bilateral lower extremity edema and increased abdominal girth.  It is reasonable to rule out CHF Continue total input/output, daily weights and fluid restriction Continue Cardiac diet  Echocardiogram in the morning   Hyponatremia possibly secondary to fluid overload Na 131, IV Lasix held at this time due to hypotension  Prolonged QTc (511 ms) Avoid QT prolonging drugs Magnesium level will be checked  COPD Continue  Mucinex, albuterol, Solu-Medrol. Continue Protonix to prevent steroid-induced ulcer Continue incentive  spirometry and flutter valve Continue supplemental oxygen to maintain O2 sat > 93% with plan to wean patient off oxygen as tolerated  ??  Chronic kidney disease stage IV BUN to creatinine 41/2.75 (most recent labs for comparison was 4 years ago with creatinine of 1.15) Renally adjust medications, avoid nephrotoxic agents/dehydration/hypotension  Essential hypertension BP meds will be held at this time due to hypotension  Hyperlipidemia Continue Zocor  GERD Continue Protonix  T2DM Continue Tradjenta, ISS and basal insulin  Obesity (BMI 38.01) Patient will be counseled on diet and lifestyle modification when more stable   DVT prophylaxis: SCDs  Code Status: Full code  Family Communication: None at bedside  Disposition Plan:  Patient is from:                        home Anticipated DC to:                   SNF or family members home Anticipated DC date:               2-3 days Anticipated DC barriers:           Patient is unstable to be discharged at this time due to hypoxia secondary to COVID-19 virus infection, GI bleed,  possible CHF requiring inpatient management and further evaluation  Consults called: Gastroenterology  Admission status: Inpatient   Bernadette Hoit MD [facility]  08/25/2020, 9:55 PM

## 2020-08-25 NOTE — ED Triage Notes (Signed)
Pt with swelling bilateral legs and abd for 3 months, pt states PCP sent here for the swelling.

## 2020-08-25 NOTE — ED Provider Notes (Signed)
[facility] EMERGENCY DEPARTMENT Provider Note   CSN: [id number] Arrival date & time: 08/25/20  1333     History Chief Complaint  Patient presents with  . Edema    Perry Hunter is a 70 y.o. male.  Patient complains of swelling in his legs and weakness for a number of weeks.  No fevers no chills no cough.  Patient history of congestive heart failure  The history is provided by the patient and medical records. No language interpreter was used.  Weakness Severity:  Moderate Onset quality:  Sudden Timing:  Constant Progression:  Worsening Chronicity:  Recurrent Context: not alcohol use   Relieved by:  Nothing Worsened by:  Nothing Ineffective treatments:  None tried Associated symptoms: no abdominal pain, no chest pain, no cough, no diarrhea, no frequency, no headaches and no seizures        Past Medical History:  Diagnosis Date  . Arthritis   . Asthma   . CHF (congestive heart failure) ([place])   . Diabetes mellitus without complication ([place])   . GERD (gastroesophageal reflux disease)   . Hypercholesteremia   . Hypertension     Patient Active Problem List   Diagnosis Date Noted  . GI bleed 08/25/2020    History reviewed. No pertinent surgical history.     History reviewed. No pertinent family history.  Social History   Tobacco Use  . Smoking status: Current Every Day Smoker    Types: Cigarettes  . Smokeless tobacco: Never Used  Substance Use Topics  . Alcohol use: Not Currently  . Drug use: Never    Home Medications Prior to Admission medications   Medication Sig Start Date End Date Taking? Authorizing Provider  acetaminophen (TYLENOL) 650 MG CR tablet Take 650 mg by mouth every 8 (eight) hours as needed for pain.    [provider]  benazepril (LOTENSIN) 40 MG tablet Take 40 mg by mouth daily.    [provider]  Cholecalciferol (VITAMIN D3) 1000 units CAPS Take 1 capsule by mouth daily.    [provider]  fluticasone  (FLOVENT HFA) 220 MCG/ACT inhaler Inhale 1 puff into the lungs 2 (two) times daily.    [provider]  hydrochlorothiazide (HYDRODIURIL) 25 MG tablet Take 25 mg by mouth daily.    [provider]  HYDROcodone-acetaminophen (NORCO/VICODIN) 5-325 MG tablet Take 1 tablet by mouth 3 (three) times daily as needed for pain. 06/12/16   [provider]  insulin detemir (LEVEMIR) 100 UNIT/ML injection Inject 40 Units into the skin at bedtime.    [provider]  metFORMIN (GLUCOPHAGE) 500 MG tablet Take 500 mg by mouth 2 (two) times daily with a meal.    [provider]  Omega-3 Fatty Acids (FISH OIL) 1000 MG CAPS Take 1 capsule by mouth daily.    [provider]  omeprazole (PRILOSEC) 40 MG capsule Take 40 mg by mouth daily as needed (acid reflux).    [provider]  PROAIR HFA 108 [phone] Base) MCG/ACT inhaler Inhale 2 puffs into the lungs 4 (four) times daily as needed for shortness of breath. 06/28/16   [provider]  simvastatin (ZOCOR) 10 MG tablet Take 10 mg by mouth daily at 6 PM.    [provider]  sitaGLIPtin (JANUVIA) 100 MG tablet Take 100 mg by mouth daily.    [provider]    Allergies    Patient has no known allergies.  Review of Systems   Review of Systems  Constitutional: Negative for appetite change and fatigue.  HENT: Negative for congestion, ear discharge and sinus pressure.   Eyes: Negative for discharge.  Respiratory: Negative for cough.   Cardiovascular: Negative for chest pain.  Gastrointestinal: Negative for abdominal pain and diarrhea.  Genitourinary: Negative for frequency and hematuria.  Musculoskeletal: Negative for back pain.       Edema in legs  Skin: Negative for rash.  Neurological: Positive for weakness. Negative for seizures and headaches.  Psychiatric/Behavioral: Negative for hallucinations.    Physical Exam Updated Vital Signs BP (!) 70/51   Pulse 99   Temp 98.1  F (36.7 C)   Resp 20   Ht '5\' 8"'$  (1.727 m)   Wt 113.4 kg   SpO2 100%   BMI 38.01 kg/m   Physical Exam Vitals and nursing note reviewed.  Constitutional:      Appearance: He is well-developed.  HENT:     Head: Normocephalic.     Nose: Nose normal.  Eyes:     General: No scleral icterus.    Extraocular Movements: EOM normal.     Conjunctiva/sclera: Conjunctivae normal.  Neck:     Thyroid: No thyromegaly.  Cardiovascular:     Rate and Rhythm: Normal rate and regular rhythm.     Heart sounds: No murmur heard. No friction rub. No gallop.   Pulmonary:     Breath sounds: No stridor. No wheezing or rales.  Chest:     Chest wall: No tenderness.  Abdominal:     General: There is no distension.     Tenderness: There is no abdominal tenderness. There is no rebound.  Genitourinary:    Comments: Rectal exam brown stool heme positive Musculoskeletal:        General: No edema. Normal range of motion.     Cervical back: Neck supple.     Comments: 2+ edema in legs  Lymphadenopathy:     Cervical: No cervical adenopathy.  Skin:    Findings: No erythema or rash.  Neurological:     Mental Status: He is alert and oriented to person, place, and time.     Motor: No abnormal muscle tone.     Coordination: Coordination normal.  Psychiatric:        Mood and Affect: Mood and affect normal.        Behavior: Behavior normal.     ED Results / Procedures / Treatments   Labs (all labs ordered are listed, but only abnormal results are displayed) Labs Reviewed  BRAIN NATRIURETIC PEPTIDE - Abnormal; Notable for the following components:      Result Value   B Natriuretic Peptide 527.0 (*)    All other components within normal limits  BASIC METABOLIC PANEL - Abnormal; Notable for the following components:   Sodium 131 (*)    BUN 41 (*)    Creatinine, Ser 2.75 (*)    Calcium 8.0 (*)    GFR, Estimated 24 (*)    All other components within normal limits  CBC WITH DIFFERENTIAL/PLATELET -  Abnormal; Notable for the following components:   RBC 3.77 (*)    Hemoglobin 6.1 (*)    HCT 22.7 (*)    MCV 60.2 (*)    MCH 16.2 (*)    MCHC 26.9 (*)    RDW 19.4 (*)    nRBC 2.7 (*)    All other components within normal limits  RETICULOCYTES - Abnormal; Notable for the following components:   RBC. 4.00 (*)    Immature  Retic Fract 21.7 (*)    All other components within normal limits  SARS CORONAVIRUS 2 BY RT PCR (HOSPITAL ORDER, [place] [facility])  PROTIME-INR  VITAMIN B12  FOLATE  IRON AND TIBC  FERRITIN  HEPATIC FUNCTION PANEL  TYPE AND SCREEN  PREPARE RBC (CROSSMATCH)  ABO/RH    EKG None  Radiology DG Chest 2 View  Result Date: 08/25/2020 CLINICAL DATA:  Bilateral leg swelling and abdominal swelling for 3 months. History of asthma and CHF. EXAM: CHEST - 2 VIEW COMPARISON:  None. FINDINGS: Cardiac silhouette is mildly enlarged. No mediastinal or hilar masses or evidence of adenopathy. Dense opacity with volume loss lies in the right middle lobe consistent with atelectasis. Additional opacity noted at the right lung base consistent with atelectasis possibly with a small effusion. Mild linear atelectasis at the lateral left lung base. Mild pleuroparenchymal scarring at the apices. Remainder of the lungs is clear. Elevated right hemidiaphragm. No pneumothorax. Skeletal structures are intact. IMPRESSION: 1. Opacity with volume loss the right middle lobe most consistent with atelectasis. No visualized mass although a central bronchial obstruction is not excluded radiographically. 2. Mild opacity at the base of the right lower lobe, most likely atelectasis. Possible small right pleural effusion. Pneumonia is not excluded. No other evidence of acute cardiopulmonary disease. 3. Mild cardiomegaly.  No evidence of congestive heart failure. Electronically Signed   By: Lajean Manes M.D.   On: 08/25/2020 15:19    Procedures Procedures   Medications Ordered in  ED Medications  pantoprazole (PROTONIX) injection 40 mg (has no administration in time range)  furosemide (LASIX) injection 40 mg (has no administration in time range)  0.9 %  sodium chloride infusion (10 mL/hr Intravenous New Bag/Given 08/25/20 2101)    ED Course  I have reviewed the triage vital signs and the nursing notes.  Pertinent labs & imaging results that were available during my care of the patient were reviewed by me and considered in my medical decision making (see chart for details).    CRITICAL CARE Performed by: Milton Ferguson Total critical care time: 45 minutes Critical care time was exclusive of separately billable procedures and treating other patients. Critical care was necessary to treat or prevent imminent or life-threatening deterioration. Critical care was time spent personally by me on the following activities: development of treatment plan with patient and/or surrogate as well as nursing, discussions with consultants, evaluation of patient's response to treatment, examination of patient, obtaining history from patient or surrogate, ordering and performing treatments and interventions, ordering and review of laboratory studies, ordering and review of radiographic studies, pulse oximetry and re-evaluation of patient's condition.  MDM Rules/Calculators/A&P                         Patient with GI bleed and anemia.  And also congestive heart failure.  He will be transfused and admitted to medicine Final Clinical Impression(s) / ED Diagnoses Final diagnoses:  Systolic congestive heart failure, unspecified HF chronicity ([place])  Iron deficiency anemia due to chronic blood loss    Rx / DC Orders ED Discharge Orders    None       Milton Ferguson, MD 08/25/20 2118

## 2020-08-25 NOTE — ED Notes (Signed)
CRITICAL VALUE ALERT  Critical Value:  Hgb 6.1  Date & Time Notied:  08/25/2020 1539  Provider Notified: D. Roderic Palau.  Orders Received/Actions taken: see chart

## 2020-08-25 NOTE — ED Notes (Signed)
Date and time results received: 08/25/20 2200  Test: COVID Critical Value: POSITIVE  Name of Provider Notified: Josephine Cables, DO

## 2020-08-25 NOTE — ED Notes (Signed)
Pts family updated on Pt status.

## 2020-08-26 ENCOUNTER — Inpatient Hospital Stay: Payer: Self-pay

## 2020-08-26 ENCOUNTER — Inpatient Hospital Stay (HOSPITAL_COMMUNITY): Payer: Medicare Other

## 2020-08-26 DIAGNOSIS — I5021 Acute systolic (congestive) heart failure: Secondary | ICD-10-CM

## 2020-08-26 DIAGNOSIS — K922 Gastrointestinal hemorrhage, unspecified: Secondary | ICD-10-CM

## 2020-08-26 DIAGNOSIS — D500 Iron deficiency anemia secondary to blood loss (chronic): Secondary | ICD-10-CM

## 2020-08-26 DIAGNOSIS — R140 Abdominal distension (gaseous): Secondary | ICD-10-CM

## 2020-08-26 DIAGNOSIS — I5020 Unspecified systolic (congestive) heart failure: Secondary | ICD-10-CM

## 2020-08-26 DIAGNOSIS — R570 Cardiogenic shock: Secondary | ICD-10-CM | POA: Diagnosis present

## 2020-08-26 DIAGNOSIS — R579 Shock, unspecified: Secondary | ICD-10-CM

## 2020-08-26 DIAGNOSIS — R0602 Shortness of breath: Secondary | ICD-10-CM

## 2020-08-26 DIAGNOSIS — R309 Painful micturition, unspecified: Secondary | ICD-10-CM

## 2020-08-26 LAB — MAGNESIUM: Magnesium: 2.1 mg/dL (ref 1.7–2.4)

## 2020-08-26 LAB — COMPREHENSIVE METABOLIC PANEL
ALT: 95 U/L — ABNORMAL HIGH (ref 0–44)
AST: 128 U/L — ABNORMAL HIGH (ref 15–41)
Albumin: 3.3 g/dL — ABNORMAL LOW (ref 3.5–5.0)
Alkaline Phosphatase: 67 U/L (ref 38–126)
Anion gap: 9 (ref 5–15)
BUN: 44 mg/dL — ABNORMAL HIGH (ref 8–23)
CO2: 25 mmol/L (ref 22–32)
Calcium: 8 mg/dL — ABNORMAL LOW (ref 8.9–10.3)
Chloride: 97 mmol/L — ABNORMAL LOW (ref 98–111)
Creatinine, Ser: 2.59 mg/dL — ABNORMAL HIGH (ref 0.61–1.24)
GFR, Estimated: 26 mL/min — ABNORMAL LOW (ref >60)
Glucose, Bld: 131 mg/dL — ABNORMAL HIGH (ref 70–99)
Potassium: 5.5 mmol/L — ABNORMAL HIGH (ref 3.5–5.1)
Sodium: 131 mmol/L — ABNORMAL LOW (ref 135–145)
Total Bilirubin: 1.4 mg/dL — ABNORMAL HIGH (ref 0.3–1.2)
Total Protein: 6.8 g/dL (ref 6.5–8.1)

## 2020-08-26 LAB — CBG MONITORING, ED
Glucose-Capillary: 126 mg/dL — ABNORMAL HIGH (ref 70–99)
Glucose-Capillary: 125 mg/dL — ABNORMAL HIGH (ref 70–99)
Glucose-Capillary: 197 mg/dL — ABNORMAL HIGH (ref 70–99)

## 2020-08-26 LAB — CBC WITH DIFFERENTIAL/PLATELET
Abs Immature Granulocytes: 0.02 10*3/uL (ref 0.00–0.07)
Basophils Absolute: 0 10*3/uL (ref 0.0–0.1)
Basophils Relative: 0 %
Eosinophils Absolute: 0 10*3/uL (ref 0.0–0.5)
Eosinophils Relative: 0 %
HCT: 24.7 % — ABNORMAL LOW (ref 39.0–52.0)
Hemoglobin: 6.9 g/dL — CL (ref 13.0–17.0)
Immature Granulocytes: 1 %
Lymphocytes Relative: 10 %
Lymphs Abs: 0.4 10*3/uL — ABNORMAL LOW (ref 0.7–4.0)
MCH: 18 pg — ABNORMAL LOW (ref 26.0–34.0)
MCHC: 27.9 g/dL — ABNORMAL LOW (ref 30.0–36.0)
MCV: 64.3 fL — ABNORMAL LOW (ref 80.0–100.0)
Monocytes Absolute: 0.1 10*3/uL (ref 0.1–1.0)
Monocytes Relative: 3 %
Neutro Abs: 3.6 10*3/uL (ref 1.7–7.7)
Neutrophils Relative %: 86 %
Platelets: 185 10*3/uL (ref 150–400)
RBC: 3.84 MIL/uL — ABNORMAL LOW (ref 4.22–5.81)
RDW: 23.5 % — ABNORMAL HIGH (ref 11.5–15.5)
WBC: 4.1 10*3/uL (ref 4.0–10.5)
nRBC: 0.7 % — ABNORMAL HIGH (ref 0.0–0.2)

## 2020-08-26 LAB — ECHOCARDIOGRAM LIMITED
Area-P 1/2: 6.37 cm2
Height: 68 in
S' Lateral: 4.72 cm
Weight: 4000 oz

## 2020-08-26 LAB — COOXEMETRY PANEL
Carboxyhemoglobin: 1.5 % (ref 0.5–1.5)
Methemoglobin: 1 % (ref 0.0–1.5)
O2 Saturation: 57 %
Total hemoglobin: 8.6 g/dL — ABNORMAL LOW (ref 12.0–16.0)

## 2020-08-26 LAB — C-REACTIVE PROTEIN: CRP: 0.9 mg/dL (ref <1.0)

## 2020-08-26 LAB — D-DIMER, QUANTITATIVE: D-Dimer, Quant: 1.34 ug/mL-FEU — ABNORMAL HIGH (ref 0.00–0.50)

## 2020-08-26 LAB — PROTIME-INR
INR: 1.8 — ABNORMAL HIGH (ref 0.8–1.2)
Prothrombin Time: 20.3 seconds — ABNORMAL HIGH (ref 11.4–15.2)

## 2020-08-26 LAB — PREPARE RBC (CROSSMATCH)

## 2020-08-26 LAB — TYPE AND SCREEN

## 2020-08-26 LAB — APTT: aPTT: 39 seconds — ABNORMAL HIGH (ref 24–36)

## 2020-08-26 LAB — PHOSPHORUS: Phosphorus: 5.6 mg/dL — ABNORMAL HIGH (ref 2.5–4.6)

## 2020-08-26 LAB — FERRITIN: Ferritin: 27 ng/mL (ref 24–336)

## 2020-08-26 MED ORDER — FENTANYL CITRATE (PF) 100 MCG/2ML IJ SOLN: 25.0000 ug | Freq: Once | INTRAMUSCULAR | Status: AC

## 2020-08-26 MED ORDER — DOPAMINE-DEXTROSE 3.2-5 MG/ML-% IV SOLN
0.0000 ug/kg/min | INTRAVENOUS | Status: DC
  Administered 2020-08-26: 5 ug/kg/min via INTRAVENOUS
  Filled 2020-08-26: qty 250

## 2020-08-26 MED ORDER — SODIUM ZIRCONIUM CYCLOSILICATE 5 G PO PACK: 10.0000 g | PACK | Freq: Once | ORAL | Status: DC

## 2020-08-26 MED ORDER — MIDAZOLAM HCL 2 MG/2ML IJ SOLN
INTRAMUSCULAR | Status: AC
  Administered 2020-08-26: 1 mg via INTRAVENOUS
  Filled 2020-08-26: qty 2

## 2020-08-26 MED ORDER — DOBUTAMINE IN D5W 4-5 MG/ML-% IV SOLN
5.0000 ug/kg/min | INTRAVENOUS | Status: DC
  Filled 2020-08-26: qty 250

## 2020-08-26 MED ORDER — FUROSEMIDE 10 MG/ML IJ SOLN
120.0000 mg | Freq: Once | INTRAVENOUS | Status: AC
  Administered 2020-08-27: 120 mg via INTRAVENOUS
  Filled 2020-08-26: qty 12

## 2020-08-26 MED ORDER — FENTANYL CITRATE (PF) 100 MCG/2ML IJ SOLN
INTRAMUSCULAR | Status: AC
  Administered 2020-08-26: 25 ug via INTRAVENOUS
  Filled 2020-08-26: qty 2

## 2020-08-26 MED ORDER — SODIUM CHLORIDE 0.9 % IV SOLN
200.0000 mg | Freq: Once | INTRAVENOUS | Status: AC
  Administered 2020-08-27: 200 mg via INTRAVENOUS
  Filled 2020-08-26: qty 200

## 2020-08-26 MED ORDER — SODIUM CHLORIDE 0.9 % IV SOLN: 250.0000 mL | INTRAVENOUS | Status: DC

## 2020-08-26 MED ORDER — SODIUM CHLORIDE 0.9 % IV SOLN
100.0000 mg | Freq: Every day | INTRAVENOUS | Status: DC
  Filled 2020-08-26: qty 20

## 2020-08-26 MED ORDER — SIMVASTATIN 20 MG PO TABS
10.0000 mg | ORAL_TABLET | Freq: Every day | ORAL | Status: DC
  Administered 2020-08-26 – 2020-08-30 (×5): 10 mg via ORAL
  Filled 2020-08-26 (×7): qty 1

## 2020-08-26 MED ORDER — SODIUM CHLORIDE 0.9% IV SOLUTION: Freq: Once | INTRAVENOUS | Status: AC

## 2020-08-26 MED ORDER — MIDAZOLAM HCL 2 MG/2ML IJ SOLN: 1.0000 mg | Freq: Once | INTRAMUSCULAR | Status: AC

## 2020-08-26 MED ORDER — CHLORHEXIDINE GLUCONATE CLOTH 2 % EX PADS
6.0000 | MEDICATED_PAD | Freq: Every day | CUTANEOUS | Status: DC
  Administered 2020-08-27 – 2020-09-07 (×12): 6 via TOPICAL

## 2020-08-26 MED ORDER — IPRATROPIUM-ALBUTEROL 0.5-2.5 (3) MG/3ML IN SOLN: 3.0000 mL | RESPIRATORY_TRACT | Status: DC | PRN

## 2020-08-26 MED ORDER — ORAL CARE MOUTH RINSE
15.0000 mL | Freq: Two times a day (BID) | OROMUCOSAL | Status: DC
  Administered 2020-08-27 – 2020-09-07 (×22): 15 mL via OROMUCOSAL

## 2020-08-26 MED ORDER — PHENYLEPHRINE HCL-NACL 10-0.9 MG/250ML-% IV SOLN
25.0000 ug/min | INTRAVENOUS | Status: DC
  Administered 2020-08-26 (×2): 75 ug/min via INTRAVENOUS
  Administered 2020-08-26: 25 ug/min via INTRAVENOUS
  Filled 2020-08-26 (×5): qty 250

## 2020-08-26 MED ORDER — DEXAMETHASONE SODIUM PHOSPHATE 10 MG/ML IJ SOLN
6.0000 mg | INTRAMUSCULAR | Status: DC
  Administered 2020-08-27 – 2020-08-28 (×3): 6 mg via INTRAVENOUS
  Filled 2020-08-26 (×3): qty 0.6

## 2020-08-26 MED ORDER — NOREPINEPHRINE 4 MG/250ML-% IV SOLN
0.0000 ug/min | INTRAVENOUS | Status: DC
  Filled 2020-08-26: qty 250

## 2020-08-26 MED ORDER — FUROSEMIDE 10 MG/ML IJ SOLN
20.0000 mg/h | INTRAVENOUS | Status: DC
  Administered 2020-08-27 – 2020-08-28 (×5): 20 mg/h via INTRAVENOUS
  Filled 2020-08-26 (×5): qty 20

## 2020-08-26 MED ORDER — SODIUM ZIRCONIUM CYCLOSILICATE 5 G PO PACK
10.0000 g | PACK | Freq: Once | ORAL | Status: AC
  Administered 2020-08-26: 10 g via ORAL
  Filled 2020-08-26: qty 2

## 2020-08-26 NOTE — Procedures (Signed)
Arterial Catheter Insertion Procedure Note  Perry Hunter  [id number]  February 11, 1951  Date:08/26/20  Time:11:57 PM    Provider Performing: Tyna Jaksch    Procedure: Insertion of Arterial Line [id number]) with US guidance [id number]   Indication(s) Blood pressure monitoring and/or need for frequent ABGs  Consent Risks of the procedure as well as the alternatives and risks of each were explained to the patient and/or caregiver.  Consent for the procedure was obtained and is signed in the bedside chart  Anesthesia None   Time Out Verified patient identification, verified procedure, site/side was marked, verified correct patient position, special equipment/implants available, medications/allergies/relevant history reviewed, required imaging and test results available.   Sterile Technique Maximal sterile technique including full sterile barrier drape, hand hygiene, sterile gown, sterile gloves, mask, hair covering, sterile ultrasound probe cover (if used).   Procedure Description Area of catheter insertion was cleaned with chlorhexidine and draped in sterile fashion. With real-time ultrasound guidance an arterial catheter was placed into the right radial artery.  Appropriate arterial tracings confirmed on monitor.     Complications/Tolerance None; patient tolerated the procedure well.   EBL Minimal   Specimen(s) None

## 2020-08-26 NOTE — Progress Notes (Signed)
Peripherally Inserted Central Catheter Consent  The IV Nurse has discussed with the patient and/or persons authorized to consent for the patient, the purpose of this procedure and the potential benefits and risks involved with this procedure.  The benefits include less needle sticks, lab draws from the catheter, and the patient may be discharged home with the catheter. Risks include, but not limited to, infection, bleeding, blood clot (thrombus formation), and puncture of an artery; nerve damage and irregular heartbeat and possibility to perform a PICC exchange if needed/ordered by physician.  Alternatives to this procedure were also discussed. Patient consents to PICC line placement which will be tomorrow 08/27/20. Patient signed consent form and form handed to bedside RN to give to [facility] to get to patient's chart on new unit.     Jeanice Lim M 08/26/2020, 7:09 PM

## 2020-08-26 NOTE — Consult Note (Signed)
Referring [place] Physician:  Neale Burly, MD Primary Gastroenterologist:  Dr. Laural Golden  Reason for Consultation:   Melena and anemia  HPI:   Patient is 70 year old Caucasian male with multiple medical problems which include diabetes mellitus GERD hyperlipidemia COPD history of CHF maintained on Eliquis who presented to emergency room with progressive lower extremity edema.  On work-up he was found to have hemoglobin of 6.9 g.  Stool was guaiac positive.  He was also felt to be in CHF and therefore hospitalized.  He is still waiting for bed placement.  He had echocardiogram today which reveals ejection fraction of 20 to 25%.  Prior studies are not available for comparison.  Patient is receiving a unit of PRBCs. In emergency room he was noted to be Covid positive. He has not been vaccinated for Covid  Patient states he began to experience lower extremity edema about 3 months ago and has been getting worse.  He also has noted progressive abdominal distention.  He has noted difficulty breathing on walking few feet.  He has not experienced chest pain or diaphoresis.  He does give history of intermittent tarry stool for the last 1 month.  He says stool is always been formed.  He denies abdominal pain or rectal bleeding.  He feels heartburn is well controlled with omeprazole.  He denies dysphagia.  No history of peptic ulcer disease.  He does not take OTC NSAIDs.  He has gained few pounds since he has been retaining fluid but he is not sure how much.  He has noted cough but has not experience chest pain or shortness of breath.  Patient is a retired Curator.  He is married.  He has been smoking for the last 50 years.  He states he smoke 2 packs a day for several years but now he is down to 5 cigarettes/day.  He used to drink alcohol but not anymore.   Past Medical History:  Diagnosis Date  . Arthritis   . Asthma   . CHF (congestive heart failure) ([place])   . Diabetes  mellitus without complication ([place])   . GERD (gastroesophageal reflux disease)   . Hypercholesteremia   . Hypertension        Normal screening colonoscopy few years ago according to the patient    Prior to Admission medications   Medication Sig Start Date End Date Taking? Authorizing Provider  acetaminophen (TYLENOL) 650 MG CR tablet Take 1,300 mg by mouth daily.   Yes [provider]  amiodarone (PACERONE) 200 MG tablet Take 200 mg by mouth daily. 08/16/20  Yes [provider]  benazepril (LOTENSIN) 40 MG tablet Take 40 mg by mouth daily.   Yes [provider]  Cholecalciferol (VITAMIN D3) 1000 units CAPS Take 1 capsule by mouth daily.   Yes [provider]  ELIQUIS 5 MG TABS tablet Take 5 mg by mouth 2 (two) times daily. 08/16/20  Yes [provider]  fluticasone (FLOVENT HFA) 220 MCG/ACT inhaler Inhale 1 puff into the lungs 2 (two) times daily.   Yes [provider]  furosemide (LASIX) 20 MG tablet Take 20 mg by mouth daily. 08/03/20  Yes [provider]  hydrochlorothiazide (HYDRODIURIL) 25 MG tablet Take 25 mg by mouth daily.   Yes [provider]  HYDROcodone-acetaminophen (NORCO/VICODIN) 5-325 MG tablet Take 1 tablet by mouth 3 (three) times daily as needed for pain. 06/12/16  Yes [provider]  insulin detemir (LEVEMIR) 100 UNIT/ML injection Inject 40 Units  into the skin at bedtime.   Yes [provider]  metFORMIN (GLUCOPHAGE) 500 MG tablet Take 1,000 mg by mouth daily with breakfast.   Yes [provider]  metoprolol succinate (TOPROL-XL) 25 MG 24 hr tablet Take 25 mg by mouth daily. 08/16/20  Yes [provider]  Omega-3 Fatty Acids (FISH OIL) 1000 MG CAPS Take 1 capsule by mouth daily.   Yes [provider]  omeprazole (PRILOSEC) 40 MG capsule Take 40 mg by mouth daily as needed (acid reflux).   Yes [provider]  PROAIR HFA 108 (90 Base) MCG/ACT inhaler  Inhale 2 puffs into the lungs 4 (four) times daily as needed for shortness of breath. 06/28/16  Yes [provider]  simvastatin (ZOCOR) 10 MG tablet Take 10 mg by mouth daily at 6 PM.   Yes [provider]  sitaGLIPtin (JANUVIA) 100 MG tablet Take 100 mg by mouth daily.   Yes [provider]  tamsulosin (FLOMAX) 0.4 MG CAPS capsule Take 0.8 mg by mouth every evening. 08/03/20  Yes [provider]    Current Facility-Administered Medications  Medication Dose Route Frequency Provider Last Rate Last Admin  . 0.9 %  sodium chloride infusion  250 mL Intravenous Continuous Adefeso, Oladapo, DO   Held at 08/25/20 2210  . 0.9 %  sodium chloride infusion  250 mL Intravenous Continuous Adefeso, Oladapo, DO   Held at 08/26/20 0326  . acetaminophen (TYLENOL) tablet 650 mg  650 mg Oral Q6H PRN Adefeso, Oladapo, DO      . albuterol (VENTOLIN HFA) 108 (90 Base) MCG/ACT inhaler 2 puff  2 puff Inhalation Q6H Adefeso, Oladapo, DO   2 puff at 08/26/20 1329  . ascorbic acid (VITAMIN C) tablet 500 mg  500 mg Oral Daily Adefeso, Oladapo, DO   500 mg at 08/26/20 0945  . chlorpheniramine-HYDROcodone (TUSSIONEX) 10-8 MG/5ML suspension 5 mL  5 mL Oral Q12H PRN Adefeso, Oladapo, DO      . dextromethorphan-guaiFENesin (MUCINEX DM) 30-600 MG per 12 hr tablet 1 tablet  1 tablet Oral BID Adefeso, Oladapo, DO   1 tablet at 08/26/20 0945  . furosemide (LASIX) injection 40 mg  40 mg Intravenous Once Adefeso, Oladapo, DO      . guaiFENesin-dextromethorphan (ROBITUSSIN DM) 100-10 MG/5ML syrup 10 mL  10 mL Oral Q4H PRN Adefeso, Oladapo, DO      . insulin aspart (novoLOG) injection 0-5 Units  0-5 Units Subcutaneous QHS Adefeso, Oladapo, DO      . insulin aspart (novoLOG) injection 0-9 Units  0-9 Units Subcutaneous TID WC Adefeso, Oladapo, DO   1 Units at 08/26/20 1259  . insulin aspart (novoLOG) injection 3 Units  3 Units Subcutaneous TID WC Adefeso, Oladapo, DO      . insulin detemir (LEVEMIR)  injection 9 Units  0.075 Units/kg Subcutaneous BID Adefeso, Oladapo, DO   9 Units at 08/25/20 2345  . linagliptin (TRADJENTA) tablet 5 mg  5 mg Oral Daily Adefeso, Oladapo, DO      . methylPREDNISolone sodium succinate (SOLU-MEDROL) 125 mg/2 mL injection 56.875 mg  0.5 mg/kg Intravenous Q12H Adefeso, Oladapo, DO   56.875 mg at 08/26/20 0945   Followed by  . [START ON 08/29/2020] predniSONE (DELTASONE) tablet 50 mg  50 mg Oral Daily Adefeso, Oladapo, DO      . pantoprazole (PROTONIX) injection 40 mg  40 mg Intravenous Q12H Adefeso, Oladapo, DO   40 mg at 08/26/20 0940  . phenylephrine (NEOSYNEPHRINE) 10-0.9 MG/250ML-% infusion  25-200  mcg/min Intravenous Titrated Adefeso, Oladapo, DO 112.5 mL/hr at 08/26/20 1037 75 mcg/min at 08/26/20 1037  . simvastatin (ZOCOR) tablet 10 mg  10 mg Oral q1800 Adefeso, Oladapo, DO      . zinc sulfate capsule 220 mg  220 mg Oral Daily Adefeso, Oladapo, DO   220 mg at 08/26/20 0945   Current Outpatient Medications  Medication Sig Dispense Refill  . acetaminophen (TYLENOL) 650 MG CR tablet Take 1,300 mg by mouth daily.    Marland Kitchen amiodarone (PACERONE) 200 MG tablet Take 200 mg by mouth daily.    . benazepril (LOTENSIN) 40 MG tablet Take 40 mg by mouth daily.    . Cholecalciferol (VITAMIN D3) 1000 units CAPS Take 1 capsule by mouth daily.    Marland Kitchen ELIQUIS 5 MG TABS tablet Take 5 mg by mouth 2 (two) times daily.    . fluticasone (FLOVENT HFA) 220 MCG/ACT inhaler Inhale 1 puff into the lungs 2 (two) times daily.    . furosemide (LASIX) 20 MG tablet Take 20 mg by mouth daily.    . hydrochlorothiazide (HYDRODIURIL) 25 MG tablet Take 25 mg by mouth daily.    Marland Kitchen HYDROcodone-acetaminophen (NORCO/VICODIN) 5-325 MG tablet Take 1 tablet by mouth 3 (three) times daily as needed for pain.    Marland Kitchen insulin detemir (LEVEMIR) 100 UNIT/ML injection Inject 40 Units into the skin at bedtime.    . metFORMIN (GLUCOPHAGE) 500 MG tablet Take 1,000 mg by mouth daily with breakfast.    . metoprolol  succinate (TOPROL-XL) 25 MG 24 hr tablet Take 25 mg by mouth daily.    . Omega-3 Fatty Acids (FISH OIL) 1000 MG CAPS Take 1 capsule by mouth daily.    Marland Kitchen omeprazole (PRILOSEC) 40 MG capsule Take 40 mg by mouth daily as needed (acid reflux).    Marland Kitchen PROAIR HFA 108 (90 Base) MCG/ACT inhaler Inhale 2 puffs into the lungs 4 (four) times daily as needed for shortness of breath.    . simvastatin (ZOCOR) 10 MG tablet Take 10 mg by mouth daily at 6 PM.    . sitaGLIPtin (JANUVIA) 100 MG tablet Take 100 mg by mouth daily.    . tamsulosin (FLOMAX) 0.4 MG CAPS capsule Take 0.8 mg by mouth every evening.      Allergies as of 08/25/2020  . (No Known Allergies)    History reviewed. No pertinent family history.  Social History   Socioeconomic History  . Marital status: Married    Spouse name: Not on file  . Number of children: Not on file  . Years of education: Not on file  . Highest education level: Not on file  Occupational History  . Not on file  Tobacco Use  . Smoking status: Current Every Day Smoker    Types: Cigarettes  . Smokeless tobacco: Never Used  Substance and Sexual Activity  . Alcohol use: Not Currently  . Drug use: Never  . Sexual activity: Not on file  Other Topics Concern  . Not on file  Social History Narrative  . Not on file   Social Determinants of Health   Financial Resource Strain: Not on file  Food Insecurity: Not on file  Transportation Needs: Not on file  Physical Activity: Not on file  Stress: Not on file  Social Connections: Not on file  Intimate Partner Violence: Not on file    Review of Systems: See HPI, otherwise normal ROS  Physical Exam: Temp:  [97.6 F (36.4 C)-98.1 F (36.7 C)] 97.7 F (36.5 C) (  01/29 1330) Pulse Rate:  [92-101] 99 (01/29 1330) Resp:  [13-26] 16 (01/29 1330) BP: (62-133)/(41-96) 104/68 (01/29 1330) SpO2:  [89 %-100 %] 100 % (01/29 1330) Weight:  [113.4 kg] 113.4 kg (01/28 1403)   Patient was seen in emergency room. He is  in isolation because he tested positive for Covid He is on nasal O2. Conjunctiva is pale.  Sclerae nonicteric Oropharyngeal mucosa not examined Neck without masses or thyromegaly. Cardiac exam with regular rhythm normal S1 and S2.  No murmur gallop noted.  Heart sounds are somewhat distant. Auscultation lungs reveal diffuse bilateral expiratory rhonchi.  No rales noted Abdomen is distended and somewhat tense on palpation but no tenderness organomegaly or masses noted. He has massive pitting edema involving both legs up to the level of knees.  He has 1+ edema above the level of knees.   Lab Results: Recent Labs    08/25/20 1452 08/26/20 0417  WBC 4.4 4.1  HGB 6.1* 6.9*  HCT 22.7* 24.7*  PLT 206 185   BMET Recent Labs    08/25/20 1452 08/26/20 0934  NA 131* 131*  K 5.0 5.5*  CL 98 97*  CO2 26 25  GLUCOSE 94 131*  BUN 41* 44*  CREATININE 2.75* 2.59*  CALCIUM 8.0* 8.0*   LFT Recent Labs    08/25/20 2050 08/26/20 0934  PROT 6.8 6.8  ALBUMIN 3.3* 3.3*  AST 110* 128*  ALT 76* 95*  ALKPHOS 68 67  BILITOT 0.7 1.4*  BILIDIR 0.3*  --   IBILI 0.4  --    PT/INR Recent Labs    08/25/20 2050 08/26/20 0417  LABPROT 20.9* 20.3*  INR 1.9* 1.8*   Hepatitis Panel No results for input(s): HEPBSAG, HCVAB, HEPAIGM, HEPBIGM in the last 72 hours.  Studies/Results: DG Chest 2 View  Result Date: 08/25/2020 CLINICAL DATA:  Bilateral leg swelling and abdominal swelling for 3 months. History of asthma and CHF. EXAM: CHEST - 2 VIEW COMPARISON:  None. FINDINGS: Cardiac silhouette is mildly enlarged. No mediastinal or hilar masses or evidence of adenopathy. Dense opacity with volume loss lies in the right middle lobe consistent with atelectasis. Additional opacity noted at the right lung base consistent with atelectasis possibly with a small effusion. Mild linear atelectasis at the lateral left lung base. Mild pleuroparenchymal scarring at the apices. Remainder of the lungs is clear.  Elevated right hemidiaphragm. No pneumothorax. Skeletal structures are intact. IMPRESSION: 1. Opacity with volume loss the right middle lobe most consistent with atelectasis. No visualized mass although a central bronchial obstruction is not excluded radiographically. 2. Mild opacity at the base of the right lower lobe, most likely atelectasis. Possible small right pleural effusion. Pneumonia is not excluded. No other evidence of acute cardiopulmonary disease. 3. Mild cardiomegaly.  No evidence of congestive heart failure. Electronically Signed   By: Lajean Manes M.D.   On: 08/25/2020 15:19   ECHOCARDIOGRAM LIMITED  Result Date: 08/26/2020    ECHOCARDIOGRAM LIMITED REPORT   Patient Name:   Perry Hunter Date of Exam: 08/26/2020 Medical Rec #:  [id number]     Height:       68.0 in Accession #:    [id number]    Weight:       250.0 lb Date of Birth:  11-04-1950     BSA:          2.247 m Patient Age:    8 years      BP:  112/80 mmHg Patient Gender: M             HR:           92 bpm. Exam Location:  [facility] Procedure: Limited Echo, Cardiac Doppler and Color Doppler Indications:    CHF  History:        Patient has no prior history of Echocardiogram examinations.                 CHF, COPD, Signs/Symptoms:LE edema; Risk Factors:Hypertension,                 Diabetes, Dyslipidemia, Former Smoker and Obesity.  Sonographer:    Dustin Flock RDCS Referring Phys: [id number] OLADAPO ADEFESO  Sonographer Comments: Patient is morbidly obese. Image acquisition challenging due to COPD and Image acquisition challenging due to patient body habitus. COVID+ IMPRESSIONS  1. Left ventricular ejection fraction, by estimation, is 20 to 25%. The left ventricle has severely decreased function. The left ventricle demonstrates global hypokinesis. The left ventricular internal cavity size was mildly dilated. Left ventricular diastolic parameters are consistent with Grade III diastolic dysfunction (restrictive). Elevated left  atrial pressure.  2. Right ventricular systolic function is severely reduced. The right ventricular size is normal. There is mildly elevated pulmonary artery systolic pressure. The estimated right ventricular systolic pressure is [id number] mmHg.  3. Left atrial size was moderately dilated.  4. Right atrial size was moderately dilated.  5. A small pericardial effusion is present. The pericardial effusion is circumferential.  6. The mitral valve is normal in structure. No evidence of mitral valve regurgitation.  7. The aortic valve is normal in structure. Aortic valve regurgitation is not visualized. Mild aortic valve sclerosis is present, with no evidence of aortic valve stenosis.  8. The inferior vena cava is dilated in size with <50% respiratory variability, suggesting right atrial pressure of 15 mmHg. FINDINGS  Left Ventricle: Left ventricular ejection fraction, by estimation, is 20 to 25%. The left ventricle has severely decreased function. The left ventricle demonstrates global hypokinesis. The left ventricular internal cavity size was mildly dilated. There is no left ventricular hypertrophy. Left ventricular diastolic parameters are consistent with Grade III diastolic dysfunction (restrictive). Elevated left atrial pressure. Right Ventricle: The right ventricular size is normal. No increase in right ventricular wall thickness. Right ventricular systolic function is severely reduced. There is mildly elevated pulmonary artery systolic pressure. The tricuspid regurgitant velocity is 2.71 m/s, and with an assumed right atrial pressure of 15 mmHg, the estimated right ventricular systolic pressure is [id number] mmHg. Left Atrium: Left atrial size was moderately dilated. Right Atrium: Right atrial size was moderately dilated. Pericardium: A small pericardial effusion is present. The pericardial effusion is circumferential. Mitral Valve: The mitral valve is normal in structure. Tricuspid Valve: The tricuspid valve is normal in  structure. Tricuspid valve regurgitation is trivial. Aortic Valve: The aortic valve is normal in structure. Aortic valve regurgitation is not visualized. Mild aortic valve sclerosis is present, with no evidence of aortic valve stenosis. Pulmonic Valve: The pulmonic valve was not well visualized. Aorta: The aortic root is normal in size and structure. Venous: The inferior vena cava is dilated in size with less than 50% respiratory variability, suggesting right atrial pressure of 15 mmHg. IAS/Shunts: No atrial level shunt detected by color flow Doppler. LEFT VENTRICLE PLAX 2D LVIDd:         5.61 cm  Diastology LVIDs:         4.72 cm  LV e' medial:  4.79 cm/s LV PW:         1.19 cm  LV E/e' medial:  25.3 LV IVS:        1.11 cm  LV e' lateral:   12.70 cm/s LVOT diam:     2.30 cm  LV E/e' lateral: 9.5 LVOT Area:     4.15 cm  LEFT ATRIUM         Index LA diam:    4.80 cm 2.14 cm/m   AORTA Ao Root diam: 3.20 cm MITRAL VALVE                TRICUSPID VALVE MV Area (PHT): 6.37 cm     TR Peak grad:   29.4 mmHg MV Decel Time: 119 msec     TR Vmax:        271.00 cm/s MV E velocity: 121.00 cm/s MV A velocity: 63.70 cm/s   SHUNTS MV E/A ratio:  1.90         Systemic Diam: 2.30 cm Dani Gobble Croitoru MD Electronically signed by Sanda Klein MD Signature Date/Time: 08/26/2020/1:56:00 PM    Final    [place] EKG SITE RITE  Result Date: 08/26/2020 If Site Rite image not attached, placement could not be confirmed due to current cardiac rhythm.  Serum iron 13, TIBC 432 and saturation 3% Serum ferritin 29 B12 378 and folate 1.9  Assessment;  Patient is 70 year old Caucasian male with history of hypertension, diabetes mellitus, chronic GERD, COPD, chronic kidney disease and obesity on an anticoagulant who presents with progressive lower extremity edema and shortness of breath.  Patient was hypotensive on admission.  He was noted to have hemoglobin of 6.1 on admission.  Patient gives 1 month history of tarry stools off and on.  His  stools have always been formed.  Patient noted to be Covid positive.  Chest film reveals cardiomegaly and atelectasis but no definite pneumonia.  ECHO done this afternoon reviewed LV ejection fraction of 20 to 25% long with other abnormalities as above. His problems can be summarized as follows.  #1.  Melena.  Patient has experienced melena for 1 month.  Patient does not take NSAIDs but he is at risk for peptic ulcer disease given his age and multiple comorbidities.  He appears to be up-to-date on screening for CRC.  His last exam reportedly was 3 to 4 years ago.  Patient will benefit from diagnostic esophagogastroduodenoscopy when he is deemed to be medically stable.  #2.  Anemia.  He has normal B12 and folate levels.  Serum ferritin is low normal but serum iron and saturation are very low confirming iron deficiency anemia.  It remains to be seen if he also has an element of chronic disease anemia.  #3.  COPD.  Patient has diffuse bilateral rhonchi.  #4.  CHF.  I suspect this is chronic problem.  We do not have access to prior records.  Current echo reveals severely diminished LV and RV function and elevated right-sided pressures.  He has 4+ edema involving his legs.  #5.  Abdominal distention.  Clinical exam not convincing for ascites but ultrasound will be obtained to rule this out.  #6.  Patient is Covid positive.  He does not have unequivocal infiltrate on his chest film.  He has cough which may be due to CHF and COPD.  Recommendations;  Abdominal ultrasound in a.m. PRBC transfusion as it is being done. We will consider esophagogastroduodenoscopy when he is medically deemed to be stable.  LOS: 1 day   Marty Sadlowski  08/26/2020, 1:59 PM

## 2020-08-26 NOTE — Consult Note (Signed)
NAME:  Perry Hunter, MRN:  [id number], DOB:  05/13/1951, LOS: 1 ADMISSION DATE:  08/25/2020, CONSULTATION DATE:  08/26/20 REFERRING MD:  Dr Manuella Ghazi, CHIEF COMPLAINT:  Chf, gib  Brief History   70 yo male with pmh dm2, GERD, tobacco abuse, copd presented to [facility] with worsening sob/doe and edema as well as black stool x1 month.   History of present illness   70 yo male with pmh HTN, Hyperlipidemia, T2DM, GERD, tobacco abuse, copd, paf on eliquis who presented to osh at the insistence of his family for weeks of increasing abdominal girth/le edema, sob/doe and black tarry stools for 1 month.   Upon presentation pt was found to have hgb of 6, tachycardic, infiltrate on cxr (noted to be covid + as well). He was grossly volume up with hypotension and hemoccult positive.   Pt was started on neo and uptitrated to >175mg, subsequently transitioned to dopamine after conversation with cardiology by Dr SManuella Ghazi Echo was done showing LVEF 20-25% with grade 3 diastolic dysfunction and severe RH dysfunction with RVSP 44.468mg  GI was consulted for his +hemoccult and recommended endoscopy when pt stabilized. At this time he has received 3u prbc.   Pt arrived to our ICU was alert and oriented x2.  On 5 mcg of dopamine.  Satting in 100s on 4 L.  Denied any overt shortness of breath.  Denied any GI symptoms did endorse melena previously consent was obtained from patient and wife for central line and A-line.    Past Medical History   Past Medical History:  Diagnosis Date  . Arthritis   . Asthma   . [place]) ([place]  . [place] mellitus without complication ([place]  . GERD (gastroesophageal reflux disease)   . Hypercholesteremia   . Hypertension    Also with history [facility] A. fib.  [facility]   [facility]   Procedures:  Central line A-line  Significant Diagnostic Tests:  1/29 echo: LVEF 20-25%  grade III diastolic dysfunction, rh severely dysfunctional.   Micro Data:  1/28 sars2: positive  Antimicrobials:  N/A  Interim history/subjective:  [facility]   Objective   Blood pressure (!) 129/99, pulse (!) 124, temperature 97.8 F (36.6 C), resp. rate 18, height '5\' 8"'$  (1.727 m), weight 113.4 kg, SpO2 (!) 88 %.        Intake/Output Summary (Last 24 hours) at 08/26/2020 1738 Last data filed at 08/26/2020 1554 Gross per 24 hour  Intake 1817.99 ml  Output 300 ml  Net 1517.99 ml   Filed Weights   08/25/20 1403  Weight: 113.4 kg    Examination: General: no acute distress HEENT: NCAT, EOMI, PERRLA, MMMP Lungs: CTA, no wheezes, rhonchi, rales Cardiovascular: RRR, no m/g/r.  Bedside echo with dilated RV and LV.  Dilated bilateral atria as well.  IVC approximately 2.5 cm and noncollapsible Abdomen: soft, NT,ND, BS+ Extremities: + edema Skin: no rashes, warm and dry Neuro: moves all 4 extremities.  Knows person and place not oriented to time GU: Foley in place  Resolved Hospital Problem list   N/A  Assessment & Plan:  Acute HFrEF:  Cardiogenic shock:  -Central line placed and A-line placed -Dobutamins and norepi for now, however will defer to cardiology if any further concerns -Agree with Lasix and Lasix drip per cardiology  GIB:  -GI consult in am.  -ppi bid -  lft c/w ongoing etoh use but pt denies -? Varices -trend H&H  covid +:  -Decadron '6mg'$  and remdesivir  Best practice:  Diet: NPO Pain/Anxiety/Delirium protocol (if indicated): not indicated VAP protocol (if indicated): Head of bed at 30 degrees DVT prophylaxis: with GIB, SCD GI prophylaxis: PPI BID Glucose control: TBD Mobility: Bed rest Code Status: Full Family Communication: Spoke with patient's wife she wishes him to remain full code and patient agrees with this Disposition: ICU  Labs   CBC: Recent Labs  Lab 08/25/20 1452 08/26/20 0417  WBC 4.4  4.1  NEUTROABS 2.6 3.6  HGB 6.1* 6.9*  HCT 22.7* 24.7*  MCV 60.2* 64.3*  PLT 206 [id number]    Basic Metabolic Panel: Recent Labs  Lab 08/25/20 1452 08/26/20 0934  NA 131* 131*  K 5.0 5.5*  CL 98 97*  CO2 26 25  GLUCOSE 94 131*  BUN 41* 44*  CREATININE 2.75* 2.59*  CALCIUM 8.0* 8.0*  MG  --  2.1  PHOS  --  5.6*   GFR: Estimated Creatinine Clearance: 32 mL/min (A) (by C-G formula based on SCr of 2.59 mg/dL (H)). Recent Labs  Lab 08/25/20 1452 08/26/20 0417  WBC 4.4 4.1    Liver Function Tests: Recent Labs  Lab 08/25/20 2050 08/26/20 0934  AST 110* 128*  ALT 76* 95*  ALKPHOS 68 67  BILITOT 0.7 1.4*  PROT 6.8 6.8  ALBUMIN 3.3* 3.3*   No results for input(s): LIPASE, AMYLASE in the last 168 hours. No results for input(s): AMMONIA in the last 168 hours.  ABG No results found for: PHART, PCO2ART, PO2ART, HCO3, TCO2, ACIDBASEDEF, O2SAT   Coagulation Profile: Recent Labs  Lab 08/25/20 2050 08/26/20 0417  INR 1.9* 1.8*    Cardiac Enzymes: No results for input(s): CKTOTAL, CKMB, CKMBINDEX, TROPONINI in the last 168 hours.  HbA1C: No results found for: HGBA1C  CBG: Recent Labs  Lab 08/25/20 2243 08/26/20 0832 08/26/20 1203 08/26/20 1700  GLUCAP 76 126* 125* 197*    Review of Systems:   14 point review of systems performed pertinent positives and negatives per HPI  Past Medical History  He,  has a past medical history of Arthritis, Asthma, CHF (congestive heart failure) ([place]), Diabetes mellitus without complication ([place]), GERD (gastroesophageal reflux disease), Hypercholesteremia, and Hypertension.   Surgical History   History reviewed. No pertinent surgical history.   Social History   reports that he has been smoking cigarettes. He has never used smokeless tobacco. He reports previous alcohol use. He reports that he does not use drugs.   Family History   His family history is not on file.   Allergies No Known Allergies   Home Medications   Prior to Admission medications   Medication Sig Start Date End Date Taking? Authorizing Provider  acetaminophen (TYLENOL) 650 MG CR tablet Take 1,300 mg by mouth daily.   Yes [provider]  amiodarone (PACERONE) 200 MG tablet Take 200 mg by mouth daily. 08/16/20  Yes [provider]  benazepril (LOTENSIN) 40 MG tablet Take 40 mg by mouth daily.   Yes [provider]  Cholecalciferol (VITAMIN D3) 1000 units CAPS Take 1 capsule by mouth daily.   Yes [provider]  ELIQUIS 5 MG TABS tablet Take 5 mg by mouth 2 (two) times daily. 08/16/20  Yes [provider]  fluticasone (FLOVENT HFA) 220 MCG/ACT inhaler Inhale 1 puff into the lungs 2 (two) times daily.   Yes [provider]  furosemide (LASIX) 20  MG tablet Take 20 mg by mouth daily. 08/03/20  Yes [provider]  hydrochlorothiazide (HYDRODIURIL) 25 MG tablet Take 25 mg by mouth daily.   Yes [provider]  HYDROcodone-acetaminophen (NORCO/VICODIN) 5-325 MG tablet Take 1 tablet by mouth 3 (three) times daily as needed for pain. 06/12/16  Yes [provider]  insulin detemir (LEVEMIR) 100 UNIT/ML injection Inject 40 Units into the skin at bedtime.   Yes [provider]  metFORMIN (GLUCOPHAGE) 500 MG tablet Take 1,000 mg by mouth daily with breakfast.   Yes [provider]  metoprolol succinate (TOPROL-XL) 25 MG 24 hr tablet Take 25 mg by mouth daily. 08/16/20  Yes [provider]  Omega-3 Fatty Acids (FISH OIL) 1000 MG CAPS Take 1 capsule by mouth daily.   Yes [provider]  omeprazole (PRILOSEC) 40 MG capsule Take 40 mg by mouth daily as needed (acid reflux).   Yes [provider]  PROAIR HFA 108 (90 Base) MCG/ACT inhaler Inhale 2 puffs into the lungs 4 (four) times daily as needed for shortness of breath. 06/28/16  Yes [provider]  simvastatin (ZOCOR) 10 MG tablet Take 10 mg by mouth daily at 6 PM.   Yes  [provider]  sitaGLIPtin (JANUVIA) 100 MG tablet Take 100 mg by mouth daily.   Yes [provider]  tamsulosin (FLOMAX) 0.4 MG CAPS capsule Take 0.8 mg by mouth every evening. 08/03/20  Yes [provider]     Critical care time: The patient is critically ill with multiple organ systems failure and requires high complexity decision making for assessment and support, frequent evaluation and titration of therapies, application of advanced monitoring technologies and extensive interpretation of multiple databases.  Critical care time 65 mins. This represents my time independent of the NP's/PA's/med students/residents time taking care of the pt. This is excluding procedures.     Newell Coral DO Internal Medicine/Pediatrics Pulmonary and Critical Care Fellow PGY-7

## 2020-08-26 NOTE — Progress Notes (Signed)
  Echocardiogram 2D Echocardiogram has been performed.  Matilde Bash 08/26/2020, 9:05 AM

## 2020-08-26 NOTE — Progress Notes (Signed)
Spoke with patient regarding PICC line order. Explained need for PICC and risk/benefits of having PICC line. Patient currently has 2 IVs peripherally, and currently vasoactive infusion has been paused. Blood pressures are currently sufficient without the pressors. Patient in agreement with holding off on PICC line and will address again if pressors are again needed. Will continue to monitor patient for vascular needs.

## 2020-08-26 NOTE — H&P (Incomplete)
Advanced Heart Failure Team Consult Note   Primary Physician: Neale Burly, MD PCP-Cardiologist:  No primary care provider on file.  Reason for Consultation: Heart failure  HPI:    Perry Hunter is a 70 y.o. M PMH: T2DM, GERD, COPD, afib on eliquis presented from [facility] for edema, and shortness of breath.  He was found to have hemoglobin of 6.1,Plt 206, Bun 41, Cr 2.75, K 5.0, FOBT positive. EKG shows afib w/ ST depression in V5-V6.  Echo EF 20-25%, grade 3DD, severe RH dysfunction, RVSP 44.4, IVC dilated.  CXR opacity RML and RLL with cardiomegaly.  BNP 527.  He was started on neo at 150 for hypotension at [facility] but changed to dopamine.  He received 3 units of PRBCs and lasix 20 mg IV. In addition,was found to be COVID positive.  He was transferred to [facility] 2H for further management.    Perry Hunter reports having swelling and shortness of breath for several months, he reports he does not sleep laying flat but on his side.  He also notes green productive cough.  Denies chest pain.  He denies fever/chills.  Denies abd pain, N/V/D.  Reports dark stools.  Denies hematuria but reports increase urination.   Perry Hunter is seen today for evaluation of heart failure at the request of PCCM.   Echo 20-25%, grade III DD, global hypokinesis, RVSF severely reduced, RVSP 44, small pericardial effusion, LA/RA dilated, IVC dilated.   Review of Systems: [y] = yes, '[ ]'$  = no   . General: Weight gain '[ ]'$ ; Weight loss '[ ]'$ ; Anorexia '[ ]'$ ; Fatigue '[ ]'$ ; Fever '[ ]'$ ; Chills '[ ]'$ ; Weakness '[ ]'$   . Cardiac: Chest pain/pressure '[ ]'$ ; Resting SOB '[ ]'$ ; Exertional SOB '[ ]'$ ; Orthopnea '[ ]'$ ; Pedal Edema '[ ]'$ ; Palpitations '[ ]'$ ; Syncope '[ ]'$ ; Presyncope '[ ]'$ ; Paroxysmal nocturnal dyspnea'[ ]'$   . Pulmonary: Cough '[ ]'$ ; Wheezing'[ ]'$ ; Hemoptysis'[ ]'$ ; Sputum '[ ]'$ ; Snoring '[ ]'$   . GI: Vomiting'[ ]'$ ; Dysphagia'[ ]'$ ; Melena'[ ]'$ ; Hematochezia '[ ]'$ ; Heartburn'[ ]'$ ; Abdominal pain '[ ]'$ ; Constipation '[ ]'$ ; Diarrhea '[ ]'$ ; BRBPR '[ ]'$   . GU:  Hematuria'[ ]'$ ; Dysuria '[ ]'$ ; Nocturia'[ ]'$   . Vascular: Pain in legs with walking '[ ]'$ ; Pain in feet with lying flat '[ ]'$ ; Non-healing sores '[ ]'$ ; Stroke '[ ]'$ ; TIA '[ ]'$ ; Slurred speech '[ ]'$ ;  . Neuro: Headaches'[ ]'$ ; Vertigo'[ ]'$ ; Seizures'[ ]'$ ; Paresthesias'[ ]'$ ;Blurred vision '[ ]'$ ; Diplopia '[ ]'$ ; Vision changes '[ ]'$   . Ortho/Skin: Arthritis '[ ]'$ ; Joint pain '[ ]'$ ; Muscle pain '[ ]'$ ; Joint swelling '[ ]'$ ; Back Pain '[ ]'$ ; Rash '[ ]'$   . Psych: Depression'[ ]'$ ; Anxiety'[ ]'$   . Heme: Bleeding problems '[ ]'$ ; Clotting disorders '[ ]'$ ; Anemia '[ ]'$   . Endocrine: Diabetes '[ ]'$ ; Thyroid dysfunction'[ ]'$   Home Medications Prior to Admission medications   Medication Sig Start Date End Date Taking? Authorizing Provider  acetaminophen (TYLENOL) 650 MG CR tablet Take 1,300 mg by mouth daily.   Yes [provider]  amiodarone (PACERONE) 200 MG tablet Take 200 mg by mouth daily. 08/16/20  Yes [provider]  benazepril (LOTENSIN) 40 MG tablet Take 40 mg by mouth daily.   Yes [provider]  Cholecalciferol (VITAMIN D3) 1000 units CAPS Take 1 capsule by mouth daily.   Yes [provider]  ELIQUIS 5 MG TABS tablet Take 5 mg by mouth 2 (two) times daily. 08/16/20  Yes [provider]  fluticasone (FLOVENT HFA) 220 MCG/ACT inhaler Inhale 1 puff into the lungs 2 (two) times daily.   Yes [provider]  furosemide (LASIX) 20 MG tablet Take 20 mg by mouth daily. 08/03/20  Yes [provider]  hydrochlorothiazide (HYDRODIURIL) 25 MG tablet Take 25 mg by mouth daily.   Yes [provider]  HYDROcodone-acetaminophen (NORCO/VICODIN) 5-325 MG tablet Take 1 tablet by mouth 3 (three) times daily as needed for pain. 06/12/16  Yes [provider]  insulin detemir (LEVEMIR) 100 UNIT/ML injection Inject 40 Units into the skin at bedtime.   Yes [provider]  metFORMIN (GLUCOPHAGE) 500 MG tablet Take 1,000 mg by mouth daily with breakfast.   Yes [provider]   metoprolol succinate (TOPROL-XL) 25 MG 24 hr tablet Take 25 mg by mouth daily. 08/16/20  Yes [provider]  Omega-3 Fatty Acids (FISH OIL) 1000 MG CAPS Take 1 capsule by mouth daily.   Yes [provider]  omeprazole (PRILOSEC) 40 MG capsule Take 40 mg by mouth daily as needed (acid reflux).   Yes [provider]  PROAIR HFA 108 (90 Base) MCG/ACT inhaler Inhale 2 puffs into the lungs 4 (four) times daily as needed for shortness of breath. 06/28/16  Yes [provider]  simvastatin (ZOCOR) 10 MG tablet Take 10 mg by mouth daily at 6 PM.   Yes [provider]  sitaGLIPtin (JANUVIA) 100 MG tablet Take 100 mg by mouth daily.   Yes [provider]  tamsulosin (FLOMAX) 0.4 MG CAPS capsule Take 0.8 mg by mouth every evening. 08/03/20  Yes [provider]    Past Medical History: Past Medical History:  Diagnosis Date  . Arthritis   . Asthma   . CHF (congestive heart failure) ([place])   . Diabetes mellitus without complication ([place])   . GERD (gastroesophageal reflux disease)   . Hypercholesteremia   . Hypertension     Past Surgical History: History reviewed. No pertinent surgical history.  Family History: History reviewed. No pertinent family history.  Social History: Social History   Socioeconomic History  . Marital status: Married    Spouse name: Not on file  . Number of children: Not on file  . Years of education: Not on file  . Highest education level: Not on file  Occupational History  . Not on file  Tobacco Use  . Smoking status: Current Every Day Smoker    Types: Cigarettes  . Smokeless tobacco: Never Used  Substance and Sexual Activity  . Alcohol use: Not Currently  . Drug use: Never  . Sexual activity: Not on file  Other Topics Concern  . Not on file  Social History Narrative  . Not on file   Social Determinants of Health   Financial Resource Strain: Not on file  Food Insecurity: Not on file   Transportation Needs: Not on file  Physical Activity: Not on file  Stress: Not on file  Social Connections: Not on file    Allergies:  No Known Allergies  Objective:    Vital Signs:   Temp:  [97.6 F (36.4 C)-98.4 F (36.9 C)] 98.4 F (36.9 C) (01/29 2132) Pulse Rate:  [92-130] 118 (01/29 2300) Resp:  [13-26] 18 (01/29 2300) BP: (68-129)/(41-99) 111/66 (01/29 2300) SpO2:  [86 %-100 %] 92 % (01/29 2300)    Weight change: Filed Weights   08/25/20 1403  Weight: 113.4 kg    Intake/Output:   Intake/Output Summary (Last 24 hours) at 08/26/2020 2321 Last  data filed at 08/26/2020 2230 Gross per 24 hour  Intake 1797.99 ml  Output 650 ml  Net 1147.99 ml      Physical Exam    General:  Well appearing. No resp difficulty HEENT: normal Neck: supple. JVP . Carotids 2+ bilat; no bruits. No lymphadenopathy or thyromegaly appreciated. Cor: PMI nondisplaced. Regular rate & rhythm. No rubs, gallops or murmurs. Lungs: clear Abdomen: soft, nontender, nondistended. No hepatosplenomegaly. No bruits or masses. Good bowel sounds. Extremities: no cyanosis, clubbing, rash, edema Neuro: alert & orientedx3, cranial nerves grossly intact. moves all 4 extremities w/o difficulty. Affect pleasant   Telemetry   ***  EKG    ***  Labs   Basic Metabolic Panel: Recent Labs  Lab 08/25/20 1452 08/26/20 0934  NA 131* 131*  K 5.0 5.5*  CL 98 97*  CO2 26 25  GLUCOSE 94 131*  BUN 41* 44*  CREATININE 2.75* 2.59*  CALCIUM 8.0* 8.0*  MG  --  2.1  PHOS  --  5.6*    Liver Function Tests: Recent Labs  Lab 08/25/20 2050 08/26/20 0934  AST 110* 128*  ALT 76* 95*  ALKPHOS 68 67  BILITOT 0.7 1.4*  PROT 6.8 6.8  ALBUMIN 3.3* 3.3*   No results for input(s): LIPASE, AMYLASE in the last 168 hours. No results for input(s): AMMONIA in the last 168 hours.  CBC: Recent Labs  Lab 08/25/20 1452 08/26/20 0417  WBC 4.4 4.1  NEUTROABS 2.6 3.6  HGB 6.1* 6.9*  HCT 22.7* 24.7*  MCV  60.2* 64.3*  PLT 206 185    Cardiac Enzymes: No results for input(s): CKTOTAL, CKMB, CKMBINDEX, TROPONINI in the last 168 hours.  BNP: BNP (last 3 results) Recent Labs    08/25/20 1452  BNP 527.0*    ProBNP (last 3 results) No results for input(s): PROBNP in the last 8760 hours.   CBG: Recent Labs  Lab 08/25/20 2243 08/26/20 0832 08/26/20 1203 08/26/20 1700  GLUCAP 76 126* 125* 197*    Coagulation Studies: Recent Labs    08/25/20 2050 08/26/20 0417  LABPROT 20.9* 20.3*  INR 1.9* 1.8*     Imaging   ECHOCARDIOGRAM LIMITED  Result Date: 08/26/2020    ECHOCARDIOGRAM LIMITED REPORT   Patient Name:   Perry Hunter Date of Exam: 08/26/2020 Medical Rec #:  [id number]     Height:       68.0 in Accession #:    [id number]    Weight:       250.0 lb Date of Birth:  04-18-1951     BSA:          2.247 m Patient Age:    40 years      BP:           112/80 mmHg Patient Gender: M             HR:           92 bpm. Exam Location:  [facility] Procedure: Limited Echo, Cardiac Doppler and Color Doppler Indications:    CHF  History:        Patient has no prior history of Echocardiogram examinations.                 CHF, COPD, Signs/Symptoms:LE edema; Risk Factors:Hypertension,                 Diabetes, Dyslipidemia, Former Smoker and Obesity.  Sonographer:    Dustin Flock RDCS Referring Phys: [id number] OLADAPO ADEFESO  Sonographer Comments:  Patient is morbidly obese. Image acquisition challenging due to COPD and Image acquisition challenging due to patient body habitus. COVID+ IMPRESSIONS  1. Left ventricular ejection fraction, by estimation, is 20 to 25%. The left ventricle has severely decreased function. The left ventricle demonstrates global hypokinesis. The left ventricular internal cavity size was mildly dilated. Left ventricular diastolic parameters are consistent with Grade III diastolic dysfunction (restrictive). Elevated left atrial pressure.  2. Right ventricular systolic function is  severely reduced. The right ventricular size is normal. There is mildly elevated pulmonary artery systolic pressure. The estimated right ventricular systolic pressure is [id number] mmHg.  3. Left atrial size was moderately dilated.  4. Right atrial size was moderately dilated.  5. A small pericardial effusion is present. The pericardial effusion is circumferential.  6. The mitral valve is normal in structure. No evidence of mitral valve regurgitation.  7. The aortic valve is normal in structure. Aortic valve regurgitation is not visualized. Mild aortic valve sclerosis is present, with no evidence of aortic valve stenosis.  8. The inferior vena cava is dilated in size with <50% respiratory variability, suggesting right atrial pressure of 15 mmHg. FINDINGS  Left Ventricle: Left ventricular ejection fraction, by estimation, is 20 to 25%. The left ventricle has severely decreased function. The left ventricle demonstrates global hypokinesis. The left ventricular internal cavity size was mildly dilated. There is no left ventricular hypertrophy. Left ventricular diastolic parameters are consistent with Grade III diastolic dysfunction (restrictive). Elevated left atrial pressure. Right Ventricle: The right ventricular size is normal. No increase in right ventricular wall thickness. Right ventricular systolic function is severely reduced. There is mildly elevated pulmonary artery systolic pressure. The tricuspid regurgitant velocity is 2.71 m/s, and with an assumed right atrial pressure of 15 mmHg, the estimated right ventricular systolic pressure is [id number] mmHg. Left Atrium: Left atrial size was moderately dilated. Right Atrium: Right atrial size was moderately dilated. Pericardium: A small pericardial effusion is present. The pericardial effusion is circumferential. Mitral Valve: The mitral valve is normal in structure. Tricuspid Valve: The tricuspid valve is normal in structure. Tricuspid valve regurgitation is trivial. Aortic  Valve: The aortic valve is normal in structure. Aortic valve regurgitation is not visualized. Mild aortic valve sclerosis is present, with no evidence of aortic valve stenosis. Pulmonic Valve: The pulmonic valve was not well visualized. Aorta: The aortic root is normal in size and structure. Venous: The inferior vena cava is dilated in size with less than 50% respiratory variability, suggesting right atrial pressure of 15 mmHg. IAS/Shunts: No atrial level shunt detected by color flow Doppler. LEFT VENTRICLE PLAX 2D LVIDd:         5.61 cm  Diastology LVIDs:         4.72 cm  LV e' medial:    4.79 cm/s LV PW:         1.19 cm  LV E/e' medial:  25.3 LV IVS:        1.11 cm  LV e' lateral:   12.70 cm/s LVOT diam:     2.30 cm  LV E/e' lateral: 9.5 LVOT Area:     4.15 cm  LEFT ATRIUM         Index LA diam:    4.80 cm 2.14 cm/m   AORTA Ao Root diam: 3.20 cm MITRAL VALVE                TRICUSPID VALVE MV Area (PHT): 6.37 cm     TR Peak grad:   29.4  mmHg MV Decel Time: 119 msec     TR Vmax:        271.00 cm/s MV E velocity: 121.00 cm/s MV A velocity: 63.70 cm/s   SHUNTS MV E/A ratio:  1.90         Systemic Diam: 2.30 cm Dani Gobble Croitoru MD Electronically signed by Sanda Klein MD Signature Date/Time: 08/26/2020/1:56:00 PM    Final    [place] EKG SITE RITE  Result Date: 08/26/2020 If Site Rite image not attached, placement could not be confirmed due to current cardiac rhythm.     Medications:     Current Medications: . albuterol  2 puff Inhalation Q6H  . vitamin C  500 mg Oral Daily  . [START ON 08/27/2020] Chlorhexidine Gluconate Cloth  6 each Topical Daily  . [START ON 08/27/2020] dexamethasone (DECADRON) injection  6 mg Intravenous Q24H  . dextromethorphan-guaiFENesin  1 tablet Oral BID  . furosemide  40 mg Intravenous Once  . insulin aspart  0-5 Units Subcutaneous QHS  . insulin aspart  0-9 Units Subcutaneous TID WC  . insulin aspart  3 Units Subcutaneous TID WC  . insulin detemir  0.075 Units/kg  Subcutaneous BID  . linagliptin  5 mg Oral Daily  . mouth rinse  15 mL Mouth Rinse BID  . pantoprazole (PROTONIX) IV  40 mg Intravenous Q12H  . simvastatin  10 mg Oral q1800  . zinc sulfate  220 mg Oral Daily     Infusions: . sodium chloride Stopped (08/25/20 2210)  . sodium chloride Stopped (08/26/20 0326)  . DOPamine 5 mcg/kg/min (08/26/20 1628)  . norepinephrine (LEVOPHED) Adult infusion         Patient Profile   ***  Assessment/Plan    Medication concerns reviewed with patient and pharmacy team. Barriers identified: ***  Length of Stay: [place], NP  08/26/2020, 11:21 PM  Advanced Heart Failure Team Pager [phone] (M-F; 7a - 4p)  Please contact [facility] for night-coverage after hours (4p -7a ) and weekends on [email or web address]

## 2020-08-26 NOTE — Procedures (Signed)
Central Venous Catheter Insertion Procedure Note  Hosam Cabrero  [id number]  1950/08/31  Date:08/26/20  Time:11:57 PM   Provider Performing:Elizeo Rodriques E Orpah Melter   Procedure: Insertion of Non-tunneled Central Venous [phone]) with US guidance [id number])   Indication(s) Medication administration  Consent Risks of the procedure as well as the alternatives and risks of each were explained to the patient and/or caregiver.  Consent for the procedure was obtained and is signed in the bedside chart  Anesthesia Topical only with 1% lidocaine   Timeout Verified patient identification, verified procedure, site/side was marked, verified correct patient position, special equipment/implants available, medications/allergies/relevant history reviewed, required imaging and test results available.  Sterile Technique Maximal sterile technique including full sterile barrier drape, hand hygiene, sterile gown, sterile gloves, mask, hair covering, sterile ultrasound probe cover (if used).  Procedure Description Area of catheter insertion was cleaned with chlorhexidine and draped in sterile fashion.  With real-time ultrasound guidance a central venous catheter was placed into the right subclavian vein. Nonpulsatile blood flow and easy flushing noted in all ports.  The catheter was sutured in place and sterile dressing applied.  Complications/Tolerance None; patient tolerated the procedure well. Chest X-ray is ordered to verify placement for internal jugular or subclavian cannulation.   Chest x-ray is not ordered for femoral cannulation.  EBL Minimal  Specimen(s) None

## 2020-08-26 NOTE — Progress Notes (Addendum)
Spoke with Nurse, adult at [facility], both are aware of the PICC order.

## 2020-08-26 NOTE — Progress Notes (Signed)
PROGRESS NOTE    Perry Hunter  [id number] DOB: [birth year] DOA: 08/25/2020 PCP: Neale Burly, MD   Brief Narrative:  Per HPI: Perry Hunter is a 70 y.o. male with medical history significant for hypertension, hyperlipidemia, T2DM, GERD, COPD (not on home oxygen) and obesity who presents to the emergency department due to 2-3 month onset of increasing bilateral leg swelling.  Patient complained of progressive leg swelling including abdominal girth associated with increasing shortness of breath that worsens on exertion as well as cough with occasional duction of greenish phlegm.  He endorsed about 1 month of black stools.  Patient denies chest pain, fever, chills, nausea or vomiting.  Patient states that his daughter, granddaughter and PCP insist that he should go to the ED for evaluation regarding his condition.  -Patient was admitted with volume overload with suspected new onset CHF that has now been confirmed and 2D echocardiogram with severe LV dysfunction.  He additionally has cardiogenic shock related to this and has been started on pressors.  He is also noted to have an acute GI bleed and has required 3 units of PRBC transfusion thus far.  He requires further evaluation and management with heart failure team.  He is in stable condition for transfer to PCCM service at [facility].  Assessment & Plan:   Principal Problem:   GI bleed Active Problems:   Symptomatic anemia   Microcytic anemia   Elevated brain natriuretic peptide (BNP) level   Hypotension   Hyponatremia   Chronic kidney disease   Essential hypertension   Hyperlipidemia   GERD (gastroesophageal reflux disease)   COPD (chronic obstructive pulmonary disease) (HCC)   Diabetes mellitus ([place])   Obesity   Cardiogenic shock (HCC)   Cardiogenic shock In setting of new-onset CHF with LV 20-25% Change phenylephrine to dopamine per Cardiology recommendations Will require central venous access if ongoing pressors are  needed (PICC line could not be placed given creatinine) Transfer to [place] service-Heart failure team available Holding diuresis for now   Symptomatic anemia possible secondary to GI bleed Total 3 units transfused now with repeat H/H pending Will need further GI evaluation with EGD prior to DC, once stabilized Keep PPI IV BID Ok for heart healthy diet  Acute respiratory failure with hypoxia due to COVID-19 virus infection Continue albuterol q.6h Continue IV Solu-Medrol per pharmacy dosing Patient is out of window for benefit from remdesivir Continue vitamin-C 500 mg p.o. Daily Continue zinc 220 mg p.o. Daily Continue Mucinex, Robitussin and Tussionex Continue Tylenol p.r.n. for fever Continue supplemental oxygen to maintain O2 sat > or = 93% with plan to wean patient off supplemental oxygen as tolerated (of note, patient does not use oxygen at baseline) Continue incentive spirometry and flutter valve q57mn as tolerated Encourage proning, early ambulation, and side laying as tolerated Continue airborne isolation precaution Continue monitoring daily inflammatory markers Physician PPE:  Surgical mask with face shield, N-95, nonsterile gloves, disposable gown, head and shoe cover s Patient PPE:  Face mask   Hyponatremia possibly secondary to fluid overload Na 131, IV Lasix held at this time due to hypotension [place]dom[place] per GI ordered to assess for ascites Repeat am labs  Prolonged QTc (511 ms) Avoid QT prolonging drugs Magnesium level will be checked  COPD Continue  Mucinex, albuterol, Solu-Medrol. Continue Protonix to prevent steroid-induced ulcer Continue incentive spirometry and flutter valve Continue supplemental oxygen to maintain O2 sat > 93% with plan to wean patient off oxygen as tolerated  Chronic kidney  disease stage IV vs AKI BUN to creatinine 41/2.75 (most recent labs for comparison was 4 years ago with creatinine of 1.15) Renally adjust medications, avoid  nephrotoxic agents/dehydration/hypotension Continue to monitor   Essential hypertension BP meds will be held at this time due to hypotension Diuresis also held due to hypotension  Hyperlipidemia Continue Zocor  GERD Continue Protonix  T2DM Continue Tradjenta, ISS and basal insulin  Obesity (BMI 38.01) Patient will be counseled on diet and lifestyle modification when more stable   DVT prophylaxis:SCDs Code Status: Full Family Communication: Discussed with daughter on phone 1/29 Disposition Plan:  Status is: Inpatient  Remains inpatient appropriate because:IV treatments appropriate due to intensity of illness or inability to take PO and Inpatient level of care appropriate due to severity of illness   Dispo: The patient is from: Home              Anticipated d/c is to: Home              Anticipated d/c date is: > 3 days              Patient currently is not medically stable to d/c.   Difficult to place patient No  Consultants:   GI  Discussed with cardiology Dr. Curt Bears  PCCM  Procedures:   See below  Antimicrobials:  Anti-infectives (From admission, onward)   None        Subjective: Patient seen and evaluated today with no new acute complaints or concerns. No acute concerns or events noted overnight.  He currently denies any chest pain or shortness of breath.  No overt bleeding noted.  Objective: Vitals:   08/26/20 1555 08/26/20 1615 08/26/20 1630 08/26/20 1645  BP: 94/76 113/73 101/70 93/63  Pulse: 99 98 98 (!) 116  Resp: (!) '23 19 18 '$ (!) 21  Temp: 97.8 F (36.6 C)     TempSrc:      SpO2: 100% 100% 100% 91%  Weight:      Height:        Intake/Output Summary (Last 24 hours) at 08/26/2020 1655 Last data filed at 08/26/2020 1554 Gross per 24 hour  Intake 1817.99 ml  Output 300 ml  Net 1517.99 ml   Filed Weights   08/25/20 1403  Weight: 113.4 kg    Examination:  General exam: Appears calm and comfortable, obese Respiratory system:  Clear to auscultation. Respiratory effort normal.  Currently on 3 L nasal cannula oxygen. Cardiovascular system: S1 & S2 heard, RRR.  Gastrointestinal system: Abdomen is moderately tense with ascites Central nervous system: Alert and awake Extremities: 2+ pitting edema bilaterally Skin: No significant lesions noted Psychiatry: Flat affect.    Data Reviewed: I have personally reviewed following labs and imaging studies  CBC: Recent Labs  Lab 08/25/20 1452 08/26/20 0417  WBC 4.4 4.1  NEUTROABS 2.6 3.6  HGB 6.1* 6.9*  HCT 22.7* 24.7*  MCV 60.2* 64.3*  PLT 206 [id number]   Basic Metabolic Panel: Recent Labs  Lab 08/25/20 1452 08/26/20 0934  NA 131* 131*  K 5.0 5.5*  CL 98 97*  CO2 26 25  GLUCOSE 94 131*  BUN 41* 44*  CREATININE 2.75* 2.59*  CALCIUM 8.0* 8.0*  MG  --  2.1  PHOS  --  5.6*   GFR: Estimated Creatinine Clearance: 32 mL/min (A) (by C-G formula based on SCr of 2.59 mg/dL (H)). Liver Function Tests: Recent Labs  Lab 08/25/20 2050 08/26/20 0934  AST 110* 128*  ALT 76* 95*  ALKPHOS 68 67  BILITOT 0.7 1.4*  PROT 6.8 6.8  ALBUMIN 3.3* 3.3*   No results for input(s): LIPASE, AMYLASE in the last 168 hours. No results for input(s): AMMONIA in the last 168 hours. Coagulation Profile: Recent Labs  Lab 08/25/20 2050 08/26/20 0417  INR 1.9* 1.8*   Cardiac Enzymes: No results for input(s): CKTOTAL, CKMB, CKMBINDEX, TROPONINI in the last 168 hours. BNP (last 3 results) No results for input(s): PROBNP in the last 8760 hours. HbA1C: No results for input(s): HGBA1C in the last 72 hours. CBG: Recent Labs  Lab 08/25/20 2243 08/26/20 0832 08/26/20 1203  GLUCAP 76 126* 125*   Lipid Profile: No results for input(s): CHOL, HDL, LDLCALC, TRIG, CHOLHDL, LDLDIRECT in the last 72 hours. Thyroid Function Tests: No results for input(s): TSH, T4TOTAL, FREET4, T3FREE, THYROIDAB in the last 72 hours. Anemia Panel: Recent Labs    08/25/20 2050 08/26/20 0417   VITAMINB12 378  --   FOLATE 10.9  --   FERRITIN 29 27  TIBC 432  --   IRON 13*  --   RETICCTPCT 1.7  --    Sepsis Labs: No results for input(s): PROCALCITON, LATICACIDVEN in the last 168 hours.  Recent Results (from the past 240 hour(s))  SARS Coronavirus 2 by RT PCR (hospital order, performed in [facility] hospital lab) Nasopharyngeal Nasopharyngeal Swab     Status: Abnormal   Collection Time: 08/25/20  8:50 PM   Specimen: Nasopharyngeal Swab  Result Value Ref Range Status   SARS Coronavirus 2 POSITIVE (A) NEGATIVE Final    Comment: RESULT CALLED TO, READ BACK BY AND VERIFIED WITH: SAPPELT,J @ 2200 ON 08/25/20 BY JUW (NOTE) SARS-CoV-2 target nucleic acids are DETECTED  SARS-CoV-2 RNA is generally detectable in upper respiratory specimens  during the acute phase of infection.  Positive results are indicative  of the presence of the identified virus, but do not rule out bacterial infection or co-infection with other pathogens not detected by the test.  Clinical correlation with patient history and  other diagnostic information is necessary to determine patient infection status.  The expected result is negative.  Fact Sheet for Patients:   StrictlyIdeas.no   Fact Sheet for Healthcare Providers:   BankingDealers.co.za    This test is not yet approved or cleared by the [place] FDA and  has been authorized for detection and/or diagnosis of SARS-CoV-2 by FDA under an Emergency Use Authorization (EUA).  This EUA will remain in effect (meaning this te st can be used) for the duration of  the COVID-19 declaration under Section 564(b)(1) of the Act, 21 U.S.C. section 360-bbb-3(b)(1), unless the authorization is terminated or revoked sooner.  Performed at [facility], [address] [place], [place] [address]          Radiology Studies: DG Chest 2 View  Result Date: 08/25/2020 CLINICAL DATA:  Bilateral leg swelling and  abdominal swelling for 3 months. History of asthma and CHF. EXAM: CHEST - 2 VIEW COMPARISON:  None. FINDINGS: Cardiac silhouette is mildly enlarged. No mediastinal or hilar masses or evidence of adenopathy. Dense opacity with volume loss lies in the right middle lobe consistent with atelectasis. Additional opacity noted at the right lung base consistent with atelectasis possibly with a small effusion. Mild linear atelectasis at the lateral left lung base. Mild pleuroparenchymal scarring at the apices. Remainder of the lungs is clear. Elevated right hemidiaphragm. No pneumothorax. Skeletal structures are intact. IMPRESSION: 1. Opacity with volume loss the right middle lobe  most consistent with atelectasis. No visualized mass although a central bronchial obstruction is not excluded radiographically. 2. Mild opacity at the base of the right lower lobe, most likely atelectasis. Possible small right pleural effusion. Pneumonia is not excluded. No other evidence of acute cardiopulmonary disease. 3. Mild cardiomegaly.  No evidence of congestive heart failure. Electronically Signed   By: Lajean Manes M.D.   On: 08/25/2020 15:19   ECHOCARDIOGRAM LIMITED  Result Date: 08/26/2020    ECHOCARDIOGRAM LIMITED REPORT   Patient Name:   Perry Hunter Date of Exam: 08/26/2020 Medical Rec #:  [id number]     Height:       68.0 in Accession #:    [id number]    Weight:       250.0 lb Date of Birth:  [birth year]     BSA:          2.247 m Patient Age:    1 years      BP:           112/80 mmHg Patient Gender: M             HR:           92 bpm. Exam Location:  [facility] Procedure: Limited Echo, Cardiac Doppler and Color Doppler Indications:    CHF  History:        Patient has no prior history of Echocardiogram examinations.                 CHF, COPD, Signs/Symptoms:LE edema; Risk Factors:Hypertension,                 Diabetes, Dyslipidemia, Former Smoker and Obesity.  Sonographer:    Dustin Flock RDCS Referring Phys: [id number] OLADAPO  ADEFESO  Sonographer Comments: Patient is morbidly obese. Image acquisition challenging due to COPD and Image acquisition challenging due to patient body habitus. COVID+ IMPRESSIONS  1. Left ventricular ejection fraction, by estimation, is 20 to 25%. The left ventricle has severely decreased function. The left ventricle demonstrates global hypokinesis. The left ventricular internal cavity size was mildly dilated. Left ventricular diastolic parameters are consistent with Grade III diastolic dysfunction (restrictive). Elevated left atrial pressure.  2. Right ventricular systolic function is severely reduced. The right ventricular size is normal. There is mildly elevated pulmonary artery systolic pressure. The estimated right ventricular systolic pressure is [id number] mmHg.  3. Left atrial size was moderately dilated.  4. Right atrial size was moderately dilated.  5. A small pericardial effusion is present. The pericardial effusion is circumferential.  6. The mitral valve is normal in structure. No evidence of mitral valve regurgitation.  7. The aortic valve is normal in structure. Aortic valve regurgitation is not visualized. Mild aortic valve sclerosis is present, with no evidence of aortic valve stenosis.  8. The inferior vena cava is dilated in size with <50% respiratory variability, suggesting right atrial pressure of 15 mmHg. FINDINGS  Left Ventricle: Left ventricular ejection fraction, by estimation, is 20 to 25%. The left ventricle has severely decreased function. The left ventricle demonstrates global hypokinesis. The left ventricular internal cavity size was mildly dilated. There is no left ventricular hypertrophy. Left ventricular diastolic parameters are consistent with Grade III diastolic dysfunction (restrictive). Elevated left atrial pressure. Right Ventricle: The right ventricular size is normal. No increase in right ventricular wall thickness. Right ventricular systolic function is severely reduced. There  is mildly elevated pulmonary artery systolic pressure. The tricuspid regurgitant velocity is 2.71 m/s, and with an assumed right atrial  pressure of 15 mmHg, the estimated right ventricular systolic pressure is [id number] mmHg. Left Atrium: Left atrial size was moderately dilated. Right Atrium: Right atrial size was moderately dilated. Pericardium: A small pericardial effusion is present. The pericardial effusion is circumferential. Mitral Valve: The mitral valve is normal in structure. Tricuspid Valve: The tricuspid valve is normal in structure. Tricuspid valve regurgitation is trivial. Aortic Valve: The aortic valve is normal in structure. Aortic valve regurgitation is not visualized. Mild aortic valve sclerosis is present, with no evidence of aortic valve stenosis. Pulmonic Valve: The pulmonic valve was not well visualized. Aorta: The aortic root is normal in size and structure. Venous: The inferior vena cava is dilated in size with less than 50% respiratory variability, suggesting right atrial pressure of 15 mmHg. IAS/Shunts: No atrial level shunt detected by color flow Doppler. LEFT VENTRICLE PLAX 2D LVIDd:         5.61 cm  Diastology LVIDs:         4.72 cm  LV e' medial:    4.79 cm/s LV PW:         1.19 cm  LV E/e' medial:  25.3 LV IVS:        1.11 cm  LV e' lateral:   12.70 cm/s LVOT diam:     2.30 cm  LV E/e' lateral: 9.5 LVOT Area:     4.15 cm  LEFT ATRIUM         Index LA diam:    4.80 cm 2.14 cm/m   AORTA Ao Root diam: 3.20 cm MITRAL VALVE                TRICUSPID VALVE MV Area (PHT): 6.37 cm     TR Peak grad:   29.4 mmHg MV Decel Time: 119 msec     TR Vmax:        271.00 cm/s MV E velocity: 121.00 cm/s MV A velocity: 63.70 cm/s   SHUNTS MV E/A ratio:  1.90         Systemic Diam: 2.30 cm Dani Gobble Croitoru MD Electronically signed by Sanda Klein MD Signature Date/Time: 08/26/2020/1:56:00 PM    Final    [place] EKG SITE RITE  Result Date: 08/26/2020 If Site Rite image not attached, placement could not be  confirmed due to current cardiac rhythm.       Scheduled Meds: . albuterol  2 puff Inhalation Q6H  . vitamin C  500 mg Oral Daily  . dextromethorphan-guaiFENesin  1 tablet Oral BID  . furosemide  40 mg Intravenous Once  . insulin aspart  0-5 Units Subcutaneous QHS  . insulin aspart  0-9 Units Subcutaneous TID WC  . insulin aspart  3 Units Subcutaneous TID WC  . insulin detemir  0.075 Units/kg Subcutaneous BID  . linagliptin  5 mg Oral Daily  . methylPREDNISolone (SOLU-MEDROL) injection  0.5 mg/kg Intravenous Q12H   Followed by  . [START ON 08/29/2020] predniSONE  50 mg Oral Daily  . pantoprazole (PROTONIX) IV  40 mg Intravenous Q12H  . simvastatin  10 mg Oral q1800  . zinc sulfate  220 mg Oral Daily   Continuous Infusions: . sodium chloride Stopped (08/25/20 2210)  . sodium chloride Stopped (08/26/20 0326)  . DOPamine 5 mcg/kg/min (08/26/20 1628)     LOS: 1 day    Time spent: 35 minutes    Quinisha Mould D Manuella Ghazi, DO [facility]  If 7PM-7AM, please contact night-coverage [email or web address] 08/26/2020, 4:55 PM

## 2020-08-27 ENCOUNTER — Inpatient Hospital Stay (HOSPITAL_COMMUNITY): Payer: Medicare Other

## 2020-08-27 DIAGNOSIS — N189 Chronic kidney disease, unspecified: Secondary | ICD-10-CM

## 2020-08-27 DIAGNOSIS — I10 Essential (primary) hypertension: Secondary | ICD-10-CM

## 2020-08-27 DIAGNOSIS — R570 Cardiogenic shock: Secondary | ICD-10-CM

## 2020-08-27 DIAGNOSIS — U071 COVID-19: Secondary | ICD-10-CM | POA: Diagnosis not present

## 2020-08-27 DIAGNOSIS — N179 Acute kidney failure, unspecified: Secondary | ICD-10-CM

## 2020-08-27 DIAGNOSIS — K922 Gastrointestinal hemorrhage, unspecified: Secondary | ICD-10-CM | POA: Diagnosis not present

## 2020-08-27 DIAGNOSIS — I509 Heart failure, unspecified: Secondary | ICD-10-CM | POA: Diagnosis not present

## 2020-08-27 DIAGNOSIS — I5020 Unspecified systolic (congestive) heart failure: Secondary | ICD-10-CM

## 2020-08-27 LAB — CBC WITH DIFFERENTIAL/PLATELET
Abs Immature Granulocytes: 0.02 10*3/uL (ref 0.00–0.07)
Abs Immature Granulocytes: 0 10*3/uL (ref 0.00–0.07)
Basophils Absolute: 0 10*3/uL (ref 0.0–0.1)
Basophils Absolute: 0 10*3/uL (ref 0.0–0.1)
Basophils Relative: 0 %
Basophils Relative: 0 %
Eosinophils Absolute: 0 10*3/uL (ref 0.0–0.5)
Eosinophils Absolute: 0 10*3/uL (ref 0.0–0.5)
Eosinophils Relative: 0 %
Eosinophils Relative: 0 %
HCT: 30.8 % — ABNORMAL LOW (ref 39.0–52.0)
HCT: 28.8 % — ABNORMAL LOW (ref 39.0–52.0)
Hemoglobin: 8.7 g/dL — ABNORMAL LOW (ref 13.0–17.0)
Hemoglobin: 8.6 g/dL — ABNORMAL LOW (ref 13.0–17.0)
Immature Granulocytes: 1 %
Lymphocytes Relative: 10 %
Lymphocytes Relative: 4 %
Lymphs Abs: 0.4 10*3/uL — ABNORMAL LOW (ref 0.7–4.0)
Lymphs Abs: 0.3 10*3/uL — ABNORMAL LOW (ref 0.7–4.0)
MCH: 17.9 pg — ABNORMAL LOW (ref 26.0–34.0)
MCH: 18.8 pg — ABNORMAL LOW (ref 26.0–34.0)
MCHC: 28.2 g/dL — ABNORMAL LOW (ref 30.0–36.0)
MCHC: 29.9 g/dL — ABNORMAL LOW (ref 30.0–36.0)
MCV: 63.4 fL — ABNORMAL LOW (ref 80.0–100.0)
MCV: 63 fL — ABNORMAL LOW (ref 80.0–100.0)
Monocytes Absolute: 0.4 10*3/uL (ref 0.1–1.0)
Monocytes Absolute: 0.4 10*3/uL (ref 0.1–1.0)
Monocytes Relative: 10 %
Monocytes Relative: 7 %
Neutro Abs: 3.2 10*3/uL (ref 1.7–7.7)
Neutro Abs: 5.7 10*3/uL (ref 1.7–7.7)
Neutrophils Relative %: 79 %
Neutrophils Relative %: 89 %
Platelets: 191 10*3/uL (ref 150–400)
Platelets: 186 10*3/uL (ref 150–400)
RBC: 4.86 MIL/uL (ref 4.22–5.81)
RBC: 4.57 MIL/uL (ref 4.22–5.81)
RDW: 24.3 % — ABNORMAL HIGH (ref 11.5–15.5)
RDW: 24.4 % — ABNORMAL HIGH (ref 11.5–15.5)
WBC: 4 10*3/uL (ref 4.0–10.5)
WBC: 6.4 10*3/uL (ref 4.0–10.5)
nRBC: 5.3 % — ABNORMAL HIGH (ref 0.0–0.2)
nRBC: 11 /100 WBC — ABNORMAL HIGH
nRBC: 3.6 % — ABNORMAL HIGH (ref 0.0–0.2)

## 2020-08-27 LAB — COMPREHENSIVE METABOLIC PANEL
ALT: 84 U/L — ABNORMAL HIGH (ref 0–44)
ALT: 77 U/L — ABNORMAL HIGH (ref 0–44)
AST: 95 U/L — ABNORMAL HIGH (ref 15–41)
AST: 85 U/L — ABNORMAL HIGH (ref 15–41)
Albumin: 3.2 g/dL — ABNORMAL LOW (ref 3.5–5.0)
Albumin: 3.2 g/dL — ABNORMAL LOW (ref 3.5–5.0)
Alkaline Phosphatase: 71 U/L (ref 38–126)
Alkaline Phosphatase: 67 U/L (ref 38–126)
Anion gap: 12 (ref 5–15)
Anion gap: 11 (ref 5–15)
BUN: 43 mg/dL — ABNORMAL HIGH (ref 8–23)
BUN: 43 mg/dL — ABNORMAL HIGH (ref 8–23)
CO2: 24 mmol/L (ref 22–32)
CO2: 25 mmol/L (ref 22–32)
Calcium: 8.2 mg/dL — ABNORMAL LOW (ref 8.9–10.3)
Calcium: 8.2 mg/dL — ABNORMAL LOW (ref 8.9–10.3)
Chloride: 97 mmol/L — ABNORMAL LOW (ref 98–111)
Chloride: 96 mmol/L — ABNORMAL LOW (ref 98–111)
Creatinine, Ser: 2.1 mg/dL — ABNORMAL HIGH (ref 0.61–1.24)
Creatinine, Ser: 2.01 mg/dL — ABNORMAL HIGH (ref 0.61–1.24)
GFR, Estimated: 33 mL/min — ABNORMAL LOW (ref >60)
GFR, Estimated: 35 mL/min — ABNORMAL LOW (ref >60)
Glucose, Bld: 153 mg/dL — ABNORMAL HIGH (ref 70–99)
Glucose, Bld: 135 mg/dL — ABNORMAL HIGH (ref 70–99)
Potassium: 5.3 mmol/L — ABNORMAL HIGH (ref 3.5–5.1)
Potassium: 5.4 mmol/L — ABNORMAL HIGH (ref 3.5–5.1)
Sodium: 133 mmol/L — ABNORMAL LOW (ref 135–145)
Sodium: 132 mmol/L — ABNORMAL LOW (ref 135–145)
Total Bilirubin: 1 mg/dL (ref 0.3–1.2)
Total Bilirubin: 1.2 mg/dL (ref 0.3–1.2)
Total Protein: 6.6 g/dL (ref 6.5–8.1)
Total Protein: 6.6 g/dL (ref 6.5–8.1)

## 2020-08-27 LAB — TYPE AND SCREEN
ABO/RH(D): O POS
ABO/RH(D): O POS
Antibody Screen: NEGATIVE
Antibody Screen: NEGATIVE
Unit division: 0
Unit division: 0

## 2020-08-27 LAB — DIRECT ANTIGLOBULIN TEST (NOT AT ARMC)
DAT, IgG: NEGATIVE
DAT, complement: NEGATIVE

## 2020-08-27 LAB — BASIC METABOLIC PANEL
Anion gap: 13 (ref 5–15)
BUN: 42 mg/dL — ABNORMAL HIGH (ref 8–23)
CO2: 28 mmol/L (ref 22–32)
Calcium: 8.3 mg/dL — ABNORMAL LOW (ref 8.9–10.3)
Chloride: 92 mmol/L — ABNORMAL LOW (ref 98–111)
Creatinine, Ser: 1.9 mg/dL — ABNORMAL HIGH (ref 0.61–1.24)
GFR, Estimated: 37 mL/min — ABNORMAL LOW (ref >60)
Glucose, Bld: 120 mg/dL — ABNORMAL HIGH (ref 70–99)
Potassium: 4.7 mmol/L (ref 3.5–5.1)
Sodium: 133 mmol/L — ABNORMAL LOW (ref 135–145)

## 2020-08-27 LAB — COOXEMETRY PANEL
Carboxyhemoglobin: 1.4 % (ref 0.5–1.5)
Carboxyhemoglobin: 1.4 % (ref 0.5–1.5)
Carboxyhemoglobin: 1.8 % — ABNORMAL HIGH (ref 0.5–1.5)
Carboxyhemoglobin: 1.5 % (ref 0.5–1.5)
Methemoglobin: 0.9 % (ref 0.0–1.5)
Methemoglobin: 0.8 % (ref 0.0–1.5)
Methemoglobin: 1.1 % (ref 0.0–1.5)
Methemoglobin: 1.3 % (ref 0.0–1.5)
O2 Saturation: 71.3 %
O2 Saturation: 67.7 %
O2 Saturation: 97.6 %
O2 Saturation: 63.9 %
Total hemoglobin: 8.4 g/dL — ABNORMAL LOW (ref 12.0–16.0)
Total hemoglobin: 8.7 g/dL — ABNORMAL LOW (ref 12.0–16.0)
Total hemoglobin: 8.2 g/dL — ABNORMAL LOW (ref 12.0–16.0)
Total hemoglobin: 8.7 g/dL — ABNORMAL LOW (ref 12.0–16.0)

## 2020-08-27 LAB — C-REACTIVE PROTEIN
CRP: 0.8 mg/dL (ref <1.0)
CRP: 0.8 mg/dL (ref <1.0)

## 2020-08-27 LAB — BPAM RBC
Blood Product Expiration Date: 202202032359
Blood Product Expiration Date: 202202192359
Blood Product Expiration Date: 202202212359
ISSUE DATE / TIME: 202201282108
ISSUE DATE / TIME: 202201282354
ISSUE DATE / TIME: 202201291309
Unit Type and Rh: 5100
Unit Type and Rh: 5100
Unit Type and Rh: 5100

## 2020-08-27 LAB — CBC
HCT: 28.2 % — ABNORMAL LOW (ref 39.0–52.0)
Hemoglobin: 8.5 g/dL — ABNORMAL LOW (ref 13.0–17.0)
MCH: 18.7 pg — ABNORMAL LOW (ref 26.0–34.0)
MCHC: 30.1 g/dL (ref 30.0–36.0)
MCV: 62.1 fL — ABNORMAL LOW (ref 80.0–100.0)
Platelets: 182 10*3/uL (ref 150–400)
RBC: 4.54 MIL/uL (ref 4.22–5.81)
RDW: 24.5 % — ABNORMAL HIGH (ref 11.5–15.5)
WBC: 6.9 10*3/uL (ref 4.0–10.5)
nRBC: 2.8 % — ABNORMAL HIGH (ref 0.0–0.2)

## 2020-08-27 LAB — HEPATITIS PANEL, ACUTE
HCV Ab: NONREACTIVE
Hep A IgM: NONREACTIVE
Hep B C IgM: NONREACTIVE
Hepatitis B Surface Ag: NONREACTIVE

## 2020-08-27 LAB — MRSA PCR SCREENING: MRSA by PCR: NEGATIVE

## 2020-08-27 LAB — FERRITIN: Ferritin: 53 ng/mL (ref 24–336)

## 2020-08-27 LAB — PROTIME-INR
INR: 1.4 — ABNORMAL HIGH (ref 0.8–1.2)
Prothrombin Time: 16.8 seconds — ABNORMAL HIGH (ref 11.4–15.2)

## 2020-08-27 LAB — D-DIMER, QUANTITATIVE
D-Dimer, Quant: 1.93 ug/mL-FEU — ABNORMAL HIGH (ref 0.00–0.50)
D-Dimer, Quant: 1.9 ug/mL-FEU — ABNORMAL HIGH (ref 0.00–0.50)

## 2020-08-27 LAB — LACTIC ACID, PLASMA
Lactic Acid, Venous: 1.3 mmol/L (ref 0.5–1.9)
Lactic Acid, Venous: 0.9 mmol/L (ref 0.5–1.9)
Lactic Acid, Venous: 1 mmol/L (ref 0.5–1.9)

## 2020-08-27 LAB — MAGNESIUM
Magnesium: 2 mg/dL (ref 1.7–2.4)
Magnesium: 2 mg/dL (ref 1.7–2.4)

## 2020-08-27 LAB — APTT: aPTT: 33 seconds (ref 24–36)

## 2020-08-27 LAB — LACTATE DEHYDROGENASE: LDH: 140 U/L (ref 98–192)

## 2020-08-27 LAB — PHOSPHORUS: Phosphorus: 5 mg/dL — ABNORMAL HIGH (ref 2.5–4.6)

## 2020-08-27 LAB — URIC ACID: Uric Acid, Serum: 8.6 mg/dL (ref 3.7–8.6)

## 2020-08-27 LAB — GLUCOSE, CAPILLARY: Glucose-Capillary: 151 mg/dL — ABNORMAL HIGH (ref 70–99)

## 2020-08-27 LAB — TROPONIN I (HIGH SENSITIVITY): Troponin I (High Sensitivity): 23 ng/L — ABNORMAL HIGH (ref <18)

## 2020-08-27 MED ORDER — NOREPINEPHRINE 4 MG/250ML-% IV SOLN
0.0000 ug/min | INTRAVENOUS | Status: DC
  Administered 2020-08-27: 2 ug/min via INTRAVENOUS
  Administered 2020-08-28 – 2020-08-29 (×3): 5 ug/min via INTRAVENOUS
  Administered 2020-08-29: 8 ug/min via INTRAVENOUS
  Filled 2020-08-27 (×5): qty 250

## 2020-08-27 MED ORDER — THIAMINE HCL 100 MG PO TABS
100.0000 mg | ORAL_TABLET | Freq: Every day | ORAL | Status: DC
  Administered 2020-08-27 – 2020-09-09 (×14): 100 mg via ORAL
  Filled 2020-08-27 (×14): qty 1

## 2020-08-27 MED ORDER — DOPAMINE-DEXTROSE 3.2-5 MG/ML-% IV SOLN
2.5000 ug/kg/min | INTRAVENOUS | Status: DC
  Administered 2020-08-27: 2.5 ug/kg/min via INTRAVENOUS
  Filled 2020-08-27: qty 250

## 2020-08-27 MED ORDER — AMIODARONE HCL IN DEXTROSE 360-4.14 MG/200ML-% IV SOLN
30.0000 mg/h | INTRAVENOUS | Status: DC
  Administered 2020-08-27 – 2020-08-30 (×6): 30 mg/h via INTRAVENOUS
  Administered 2020-08-31 (×2): 60 mg/h via INTRAVENOUS
  Administered 2020-09-01 (×2): 30 mg/h via INTRAVENOUS
  Administered 2020-09-02: 04:00:00 60 mg/h via INTRAVENOUS
  Administered 2020-09-02 – 2020-09-03 (×3): 30 mg/h via INTRAVENOUS
  Administered 2020-09-03: 21:00:00 60 mg/h via INTRAVENOUS
  Administered 2020-09-04: 20:00:00 30 mg/h via INTRAVENOUS
  Administered 2020-09-04 (×2): 60 mg/h via INTRAVENOUS
  Administered 2020-09-05 – 2020-09-08 (×7): 30 mg/h via INTRAVENOUS
  Filled 2020-08-27 (×29): qty 200

## 2020-08-27 MED ORDER — FOLIC ACID 1 MG PO TABS
1.0000 mg | ORAL_TABLET | Freq: Every day | ORAL | Status: DC
  Administered 2020-08-27 – 2020-09-09 (×14): 1 mg via ORAL
  Filled 2020-08-27 (×14): qty 1

## 2020-08-27 MED ORDER — AMIODARONE HCL IN DEXTROSE 360-4.14 MG/200ML-% IV SOLN
60.0000 mg/h | INTRAVENOUS | Status: AC
  Administered 2020-08-27: 60 mg/h via INTRAVENOUS
  Filled 2020-08-27: qty 200

## 2020-08-27 MED ORDER — SODIUM CHLORIDE 0.9 % IV SOLN
510.0000 mg | Freq: Once | INTRAVENOUS | Status: AC
  Administered 2020-08-27: 510 mg via INTRAVENOUS
  Filled 2020-08-27: qty 17

## 2020-08-27 NOTE — H&P (Addendum)
Cardiology Admission History and Physical:   Patient ID: Perry Hunter MRN: [id number]; DOB: [birth year]   Admission date: 08/25/2020  Primary Care Provider: Neale Burly, MD [facility] Cardiologist: No primary care provider on file.  [facility] Electrophysiologist:  None   Chief Complaint:  Shortness of breath and edema  Patient Profile:   Perry Hunter is a 70 y.o. male with cardiogenic shock.  History of Present Illness:   Perry Hunter  is a 70 y.o. M PMH: T2DM, GERD, COPD, afib on eliquis presented from [facility] for edema, and shortness of breath.  He was found to have hemoglobin of 6.1,Plt 206, Bun 41, Cr 2.75, K 5.0, FOBT positive. EKG shows afib w/ ST depression in V5-V6.  Echo EF 20-25%, grade 3DD, severe RH dysfunction, RVSP 44.4, IVC dilated.  CXR opacity RML and RLL with cardiomegaly.  BNP 527.  He was started on neo at 150 for hypotension at [facility] but changed to dopamine.  He received 3 units of PRBCs and lasix 20 mg IV. In addition,was found to be COVID positive.  He was transferred to [facility] for further management.    Perry Hunter reports having swelling and shortness of breath for several months, he reports he does not sleep laying flat but on his side.  He also notes green productive cough.  Denies chest pain.  He denies fever/chills.  Denies abd pain, N/V/D.  Reports dark stools.  Denies hematuria but reports increase urination.  Finally, Perry Hunter notes some lightheadedness and dizziness these past two mornings at home.  He denies using oxygen at home.  Continues to smoke daily.    Perry Hunter is seen today for evaluation of cardiogenic shock at the request of [facility].  Echo 20-25%, grade III DD, global hypokinesis, RVSF severely reduced, RVSP 44, small pericardial effusion, LA/RA dilated, IVC dilated.    Past Medical History:  Diagnosis Date  . Arthritis   . Asthma   . CHF (congestive heart failure) ([place])   . Diabetes mellitus without  complication ([place])   . GERD (gastroesophageal reflux disease)   . Hypercholesteremia   . Hypertension     History reviewed. No pertinent surgical history.   Medications Prior to Admission: Prior to Admission medications   Medication Sig Start Date End Date Taking? Authorizing Provider  acetaminophen (TYLENOL) 650 MG CR tablet Take 1,300 mg by mouth daily.   Yes [provider]  amiodarone (PACERONE) 200 MG tablet Take 200 mg by mouth daily. 08/16/20  Yes [provider]  benazepril (LOTENSIN) 40 MG tablet Take 40 mg by mouth daily.   Yes [provider]  Cholecalciferol (VITAMIN D3) 1000 units CAPS Take 1 capsule by mouth daily.   Yes [provider]  ELIQUIS 5 MG TABS tablet Take 5 mg by mouth 2 (two) times daily. 08/16/20  Yes [provider]  fluticasone (FLOVENT HFA) 220 MCG/ACT inhaler Inhale 1 puff into the lungs 2 (two) times daily.   Yes [provider]  furosemide (LASIX) 20 MG tablet Take 20 mg by mouth daily. 08/03/20  Yes [provider]  hydrochlorothiazide (HYDRODIURIL) 25 MG tablet Take 25 mg by mouth daily.   Yes [provider]  HYDROcodone-acetaminophen (NORCO/VICODIN) 5-325 MG tablet Take 1 tablet by mouth 3 (three) times daily as needed for pain. 06/12/16  Yes [provider]  insulin detemir (LEVEMIR) 100 UNIT/ML injection Inject 40 Units into the skin at bedtime.   Yes [provider]  metFORMIN (GLUCOPHAGE) 500 MG tablet Take 1,000 mg by mouth daily with breakfast.   Yes [provider]  metoprolol succinate (TOPROL-XL) 25 MG 24 hr tablet Take 25 mg by mouth daily. 08/16/20  Yes [provider]  Omega-3 Fatty Acids (FISH OIL) 1000 MG CAPS Take 1 capsule by mouth daily.   Yes [provider]  omeprazole (PRILOSEC) 40 MG capsule Take 40 mg by mouth daily as needed (acid reflux).   Yes [provider]  PROAIR HFA 108 (90 Base) MCG/ACT inhaler Inhale 2  puffs into the lungs 4 (four) times daily as needed for shortness of breath. 06/28/16  Yes [provider]  simvastatin (ZOCOR) 10 MG tablet Take 10 mg by mouth daily at 6 PM.   Yes [provider]  sitaGLIPtin (JANUVIA) 100 MG tablet Take 100 mg by mouth daily.   Yes [provider]  tamsulosin (FLOMAX) 0.4 MG CAPS capsule Take 0.8 mg by mouth every evening. 08/03/20  Yes [provider]     Allergies:   No Known Allergies  Social History:   Social History   Socioeconomic History  . Marital status: Married    Spouse name: Not on file  . Number of children: Not on file  . Years of education: Not on file  . Highest education level: Not on file  Occupational History  . Not on file  Tobacco Use  . Smoking status: Current Every Day Smoker    Types: Cigarettes  . Smokeless tobacco: Never Used  Substance and Sexual Activity  . Alcohol use: Not Currently  . Drug use: Never  . Sexual activity: Not on file  Other Topics Concern  . Not on file  Social History Narrative  . Not on file   Social Determinants of Health   Financial Resource Strain: Not on file  Food Insecurity: Not on file  Transportation Needs: Not on file  Physical Activity: Not on file  Stress: Not on file  Social Connections: Not on file  Intimate Partner Violence: Not on file    Family History:  Denies family history of cardiac disease.  The patient's family history is not on file.    ROS:  Please see the history of present illness.  All other ROS reviewed and negative.     Physical Exam/Data:   Vitals:   08/26/20 2215 08/26/20 2230 08/26/20 2245 08/26/20 2300  BP: (!) 107/94 107/67 114/68 111/66  Pulse: (!) 122 (!) 128 (!) 123 (!) 118  Resp: (!) '25 18 18 18  '$ Temp:      TempSrc:      SpO2: 98% 90% 91% 92%  Weight:      Height:        Intake/Output Summary (Last 24 hours) at 08/27/2020 0112 Last data filed at 08/26/2020 2230 Gross per 24 hour  Intake 1482.99 ml   Output 650 ml  Net 832.99 ml   Last 3 Weights 08/25/2020  Weight (lbs) 250 lb  Weight (kg) 113.399 kg     Body mass index is 38.01 kg/m.  CVP: 27 General:  Ill appearing, alert and oriented person, place, year and events.  HEENT: Normal.  Lymph: no adenopathy Neck: Supple, + JVD Endocrine:  No thryomegaly Vascular: No carotid bruits; FA pulses 2+ bilaterally without bruits  Cardiac:  normal S1, S2; irregular, rates 100s; no murmur.  Lungs:  Bilateral  + wheezing with rales.  Abd: Soft, nontender, distended, hypoactive BS.  Ext: +3 pitting edema.  Musculoskeletal:  No deformities, BUE and BLE strength normal and equal Skin: Cool lower extremities, no mottling.  Neuro:  No focal abnormalities noted Psych:  Normal affect   EKG:  The ECG that was done 1/28 showing afib with slight ST depression in V5-V6 was personally reviewed.   Relevant CV Studies: Echo 1/29: EF 20-25%, global hypokinesis, GIIIDD, elevated L atrial pressure with dilated L atrial.  Severely reduced right ventricular systolic function with dilated right atrial size.  Small circumferential pericardial effusion. Dilated IVC.    Laboratory Data:  High Sensitivity Troponin:   Recent Labs  Lab 08/26/20 2323  TROPONINIHS 23*      Chemistry Recent Labs  Lab 08/26/20 0934 08/26/20 2323  NA 131* 133*  K 5.5* 5.3*  CL 97* 97*  CO2 25 24  GLUCOSE 131* 153*  BUN 44* 43*  CREATININE 2.59* 2.10*  CALCIUM 8.0* 8.2*  GFRNONAA 26* 33*  ANIONGAP 9 12    Recent Labs  Lab 08/26/20 0934 08/26/20 2323  PROT 6.8 6.6  ALBUMIN 3.3* 3.2*  AST 128* 95*  ALT 95* 84*  ALKPHOS 67 71  BILITOT 1.4* 1.0   Hematology Recent Labs  Lab 08/26/20 0417 08/26/20 2323  WBC 4.1 4.0  RBC 3.84* 4.86  HGB 6.9* 8.7*  HCT 24.7* 30.8*  MCV 64.3* 63.4*  MCH 18.0* 17.9*  MCHC 27.9* 28.2*  RDW 23.5* 24.3*  PLT 185 191   BNP Recent Labs  Lab 08/25/20 1452  BNP 527.0*    DDimer  Recent Labs  Lab 08/26/20 2323   DDIMER 1.93*     Radiology/Studies:  DG CHEST PORT 1 VIEW  Result Date: 08/27/2020 CLINICAL DATA:  Central line placement EXAM: PORTABLE CHEST 1 VIEW COMPARISON:  Cxr 08/25/20 FINDINGS: Interval placement of a left subclavian central venous catheter with tip overlying the expected region of the confluence of the left brachiocephalic vein and superior vena cava. Similar-appearing marked cardiomegaly. Cardiomediastinal silhouette is unchanged. Aortic arch calcification. Redemonstration of right middle lobe opacity. Increased interstitial markings. Suggestion of interval development of an at least trace to small volume right pleural effusion. No definite left pleural effusion. No pneumothorax. No acute osseous abnormality. IMPRESSION: 1. Interval placement of left subclavian central venous catheter in grossly appropriate position. 2. Marked cardiomegaly with pulmonary edema and interval development of an least trace to small volume right pleural effusion. Cannot exclude a pericardial effusion. 3. Bilateral vague patchy airspace opacities could represent atelectasis versus superimposed infection/inflammation. Electronically Signed   By: Iven Finn M.D.   On: 08/27/2020 00:24   ECHOCARDIOGRAM LIMITED  Result Date: 08/26/2020    ECHOCARDIOGRAM LIMITED REPORT   Patient Name:   Perry Hunter Date of Exam: 08/26/2020 Medical Rec #:  [id number]     Height:       68.0 in Accession #:    [id number]    Weight:       250.0 lb Date of Birth:  1950-09-20     BSA:          2.247 m Patient Age:    67 years      BP:           112/80 mmHg Patient Gender: M             HR:           92 bpm. Exam Location:  [facility] Procedure: Limited Echo, Cardiac Doppler and Color Doppler Indications:    CHF  History:        Patient  has no prior history of Echocardiogram examinations.                 CHF, COPD, Signs/Symptoms:LE edema; Risk Factors:Hypertension,                 Diabetes, Dyslipidemia, Former Smoker and Obesity.   Sonographer:    Dustin Flock RDCS Referring Phys: [id number] OLADAPO ADEFESO  Sonographer Comments: Patient is morbidly obese. Image acquisition challenging due to COPD and Image acquisition challenging due to patient body habitus. COVID+ IMPRESSIONS  1. Left ventricular ejection fraction, by estimation, is 20 to 25%. The left ventricle has severely decreased function. The left ventricle demonstrates global hypokinesis. The left ventricular internal cavity size was mildly dilated. Left ventricular diastolic parameters are consistent with Grade III diastolic dysfunction (restrictive). Elevated left atrial pressure.  2. Right ventricular systolic function is severely reduced. The right ventricular size is normal. There is mildly elevated pulmonary artery systolic pressure. The estimated right ventricular systolic pressure is [id number] mmHg.  3. Left atrial size was moderately dilated.  4. Right atrial size was moderately dilated.  5. A small pericardial effusion is present. The pericardial effusion is circumferential.  6. The mitral valve is normal in structure. No evidence of mitral valve regurgitation.  7. The aortic valve is normal in structure. Aortic valve regurgitation is not visualized. Mild aortic valve sclerosis is present, with no evidence of aortic valve stenosis.  8. The inferior vena cava is dilated in size with <50% respiratory variability, suggesting right atrial pressure of 15 mmHg. FINDINGS  Left Ventricle: Left ventricular ejection fraction, by estimation, is 20 to 25%. The left ventricle has severely decreased function. The left ventricle demonstrates global hypokinesis. The left ventricular internal cavity size was mildly dilated. There is no left ventricular hypertrophy. Left ventricular diastolic parameters are consistent with Grade III diastolic dysfunction (restrictive). Elevated left atrial pressure. Right Ventricle: The right ventricular size is normal. No increase in right ventricular wall  thickness. Right ventricular systolic function is severely reduced. There is mildly elevated pulmonary artery systolic pressure. The tricuspid regurgitant velocity is 2.71 m/s, and with an assumed right atrial pressure of 15 mmHg, the estimated right ventricular systolic pressure is [id number] mmHg. Left Atrium: Left atrial size was moderately dilated. Right Atrium: Right atrial size was moderately dilated. Pericardium: A small pericardial effusion is present. The pericardial effusion is circumferential. Mitral Valve: The mitral valve is normal in structure. Tricuspid Valve: The tricuspid valve is normal in structure. Tricuspid valve regurgitation is trivial. Aortic Valve: The aortic valve is normal in structure. Aortic valve regurgitation is not visualized. Mild aortic valve sclerosis is present, with no evidence of aortic valve stenosis. Pulmonic Valve: The pulmonic valve was not well visualized. Aorta: The aortic root is normal in size and structure. Venous: The inferior vena cava is dilated in size with less than 50% respiratory variability, suggesting right atrial pressure of 15 mmHg. IAS/Shunts: No atrial level shunt detected by color flow Doppler. LEFT VENTRICLE PLAX 2D LVIDd:         5.61 cm  Diastology LVIDs:         4.72 cm  LV e' medial:    4.79 cm/s LV PW:         1.19 cm  LV E/e' medial:  25.3 LV IVS:        1.11 cm  LV e' lateral:   12.70 cm/s LVOT diam:     2.30 cm  LV E/e' lateral: 9.5 LVOT Area:  4.15 cm  LEFT ATRIUM         Index LA diam:    4.80 cm 2.14 cm/m   AORTA Ao Root diam: 3.20 cm MITRAL VALVE                TRICUSPID VALVE MV Area (PHT): 6.37 cm     TR Peak grad:   29.4 mmHg MV Decel Time: 119 msec     TR Vmax:        271.00 cm/s MV E velocity: 121.00 cm/s MV A velocity: 63.70 cm/s   SHUNTS MV E/A ratio:  1.90         Systemic Diam: 2.30 cm Dani Gobble Croitoru MD Electronically signed by Sanda Klein MD Signature Date/Time: 08/26/2020/1:56:00 PM    Final    [place] EKG SITE RITE  Result Date:  08/26/2020 If Site Rite image not attached, placement could not be confirmed due to current cardiac rhythm.    Assessment and Plan:   Perry Hunter  is a 70 y.o. M PMH: T2DM, GERD, COPD, afib on eliquis presented from [facility] for edema, and shortness of breath found to be anemic and in cardiogenic shock s/p 3 units of PRBCs.   1. New onset heart failure with cardiogenic shock - Echo 1/29: EF 20-25%, global hypokinesis, GIIIDD, severely reduced right ventricular systolic function with dilated right atrial size. Dilated IVC.  BNP 527. Co-ox 57%. Lactic acid 1.3. BLE +3. CVP 26.  Unsure the etiology for HF could be 2/2 afib but will likely need LHC/RHC once stable.  Home meds: lasix 20 daily, toprol XL 25 Plan:  -Keep dopamine at 2.5, give lasix 120 IVP followed by gtt at 20. Repeat co-ox/lactic 4 hours.  -Daily weights, I&Os -Hold Bblockers in setting of low cardiac output -Hold ACEi/ARNI/Spiro in setting of AKI and hyperkalemia 2. COVID -Reports productive green cough, denies fever or chills; denies having COVID vaccine. CXR shows RML/RLL opacity.  Requiring 4L of oxygen via [place].  Plan: -Remdesivr -Dexamethesone 6 IV  3. Afib w/ RVR -Hx of afib on eliquis and amiodarone at home Plan: -Continue to monitor for now; hold eliquis, can resume home amiodarone 4. GIB/Anemia -On eliquis for afib, presentation hgb 6.1, +FOBT; Ferritin 27, iron 13, folate 10.9.  S/p transfused 3 units of PRBCs -HH: 8.7/30.8 -On dexamethasone Plan: -Continue w/ PPI BID -Hold anticoagulation for now -Consult GI 5. Acute on CKD  - Likely cardiorenal however could have had component of post renal, takes flomax at home and foley place with immediate output of 310m.  -Baseline Cr 1.15, peak Cr 2.59 Plan:  -Monitor I&Os, BMP daily 6. COPD - Diffuse expiratory wheeze on exam w/ green productive cough; hx of smoking Plan:  -Currently getting treatment for COVID with steroids and neb ordered.   7. HTN Hypotensive started on neo then changed to dopamine.  Home meds: HCTZ, benazepril,  Plan: -Hold home antiHTN. Monitor for now.  8. T2DM -Home meds: levemir 40 units HS, metformin 1000, januvia 10 Plan:  -SSI, Levemir titrate as needed in setting of AKI 9. Hyperkalemia -Presented with K+ 5.5, repeat 5.3. No peak T waves on EKG Plan: Continue to monitor 10. Hypovolemic Hyponatremic -Na2+ 131 -> 133 Plan: -Diuresed and fluid restrictions   Risk Assessment/Risk Scores:    :[id number] York Heart Association (NYHA) Functional Class NYHA Class III  CHA2DS2-VASc Score = 3  This indicates a 3.2% annual risk of stroke. The patient's score is based upon: CHF History: No HTN  History: Yes Diabetes History: Yes Stroke History: No Vascular Disease History: No Age Score: 1 Gender Score: 0      Severity of Illness: The appropriate patient status for this patient is INPATIENT. Inpatient status is judged to be reasonable and necessary in order to provide the required intensity of service to ensure the patient's safety. The patient's presenting symptoms, physical exam findings, and initial radiographic and laboratory data in the context of their chronic comorbidities is felt to place them at high risk for further clinical deterioration. Furthermore, it is not anticipated that the patient will be medically stable for discharge from the hospital within 2 midnights of admission. The following factors support the patient status of inpatient.   " The patient's presenting symptoms include cardiogenic shock. " The worrisome physical exam findings include CVP 26, BLE +3 edema. " The initial radiographic and laboratory data are worrisome because of COVID positive, CXR RML/RLL opacity and pulmonary edema.  Hgb 6.1. " The chronic co-morbidities include afib, HTN, T2DM, CKD.   * I certify that at the point of admission it is my clinical judgment that the patient will require inpatient  hospital care spanning beyond 2 midnights from the point of admission due to high intensity of service, high risk for further deterioration and high frequency of surveillance required.*    For questions or updates, please contact [place] Please consult [email or web address] for contact info under     Signed, Carlene Coria, NP  08/27/2020 1:12 AM

## 2020-08-27 NOTE — Plan of Care (Signed)
  Problem: Education: Goal: Knowledge of General Education information will improve Description: Including pain rating scale, medication(s)/side effects and non-pharmacologic comfort measures Outcome: Progressing   Problem: Health Behavior/Discharge Planning: Goal: Ability to manage health-related needs will improve Outcome: Progressing   Problem: Clinical Measurements: Goal: Ability to maintain clinical measurements within normal limits will improve Outcome: Progressing Goal: Will remain free from infection Outcome: Progressing Goal: Respiratory complications will improve Outcome: Progressing Goal: Cardiovascular complication will be avoided Outcome: Progressing   Problem: Activity: Goal: Risk for activity intolerance will decrease Outcome: Progressing   Problem: Coping: Goal: Level of anxiety will decrease Outcome: Progressing   Problem: Elimination: Goal: Will not experience complications related to bowel motility Outcome: Progressing Goal: Will not experience complications related to urinary retention Outcome: Progressing   Problem: Pain Managment: Goal: General experience of comfort will improve Outcome: Progressing   Problem: Safety: Goal: Ability to remain free from injury will improve Outcome: Progressing   Problem: Skin Integrity: Goal: Risk for impaired skin integrity will decrease Outcome: Progressing   Problem: Coping: Goal: Psychosocial and spiritual needs will be supported Outcome: Progressing   Problem: Respiratory: Goal: Will maintain a patent airway Outcome: Progressing Goal: Complications related to the disease process, condition or treatment will be avoided or minimized Outcome: Progressing   Problem: Clinical Measurements: Goal: Diagnostic test results will improve Outcome: Not Progressing   Problem: Nutrition: Goal: Adequate nutrition will be maintained Outcome: Not Progressing   Problem: Education: Goal: Knowledge of risk factors  and measures for prevention of condition will improve Outcome: Not Progressing

## 2020-08-27 NOTE — Progress Notes (Addendum)
NAME:  Perry Hunter, MRN:  [id number], DOB:  1950/11/02, LOS: 2 ADMISSION DATE:  08/25/2020, CONSULTATION DATE:  08/26/20 REFERRING MD:  Dr Manuella Ghazi, CHIEF COMPLAINT:  Chf, gib  Brief History   70 yo male with pmh dm2, GERD, tobacco abuse, copd presented to osh with worsening sob/doe and edema as well as black stool x1 month.   History of present illness   70 yo male with pmh HTN, Hyperlipidemia, T2DM, GERD, tobacco abuse, copd, paf on eliquis who presented to osh at the insistence of his family for weeks of increasing abdominal girth/le edema, sob/doe and black tarry stools for 1 month.   Upon presentation pt was found to have hgb of 6, tachycardic, infiltrate on cxr (noted to be covid + as well). He was grossly volume up with hypotension and hemoccult positive.   Pt was started on neo and uptitrated to >125mg, subsequently transitioned to dopamine after conversation with cardiology by Dr SManuella Ghazi Echo was done showing LVEF 20-25% with grade 3 diastolic dysfunction and severe RH dysfunction with RVSP 44.461mg  GI was consulted for his +hemoccult and recommended endoscopy when pt stabilized. At this time he has received 3u prbc.   Pt arrived to our ICU was alert and oriented x2.  On 5 mcg of dopamine.  Satting in 100s on 4 L.  Denied any overt shortness of breath.  Denied any GI symptoms did endorse melena previously consent was obtained from patient and wife for central line and A-line.   Past Medical History   Past Medical History:  Diagnosis Date  . Arthritis   . Asthma   . CHF (congestive heart failure) ([facility]  . Diabetes mellitus without complication ([facility]  . GERD (gastroesophageal reflux disease)   . Hypercholesteremia   . Hypertension    Also with history of A. fib.  [facility]   [facility]   Procedures:  Central line A-line  Significant Diagnostic Tests:  1/29 echo: LVEF 20-25% grade  III diastolic dysfunction, rh severely dysfunctional.  1/30 abdominal Us>>  Micro Data:  1/28 sars2: positive  Antimicrobials:  N/A  Interim history/subjective:  [facility]  1/30 stopping dopa. Getting Abdominal US[place]ive   Blood pressure (!) 79/57, pulse 94, temperature 97.8 F (36.6 C), temperature source Axillary, resp. rate 13, height '5\' 8"'$  (1.727 m), weight 121.5 kg, SpO2 96 %. CVP:  [24 mmHg-28 mmHg] 27 mmHg      Intake/Output Summary (Last 24 hours) at 08/27/2020 0751 Last data filed at 08/27/2020 0630 Gross per 24 hour  Intake 1735.25 ml  Output 2720 ml  Net -984.75 ml   Filed Weights   08/25/20 1403 08/27/20 0408  Weight: 113.4 kg 121.5 kg    Examination: General: chronically ill older adult M reclined in bed NAD getting abdominal US[place]oreaEENT: NCAT pink mm. Trachea midline Lungs: Symmetrical chest expansion, even and unlabored on Terry. Slightly coarse but no rhonchi or crackles Cardiovascular: irir s1s2 no rgm Abdomen: soft round non-tender, mild distension. + bowel sounds Extremities: BLE 3+ edema  No obvious joint deformity no cyanosis or clubbing Skin: c/d/w. Scattered ecchymosis Neuro: AAOx4 following commands PERRL.  GU: defer Psych: calm, pleasant mood, cooperative   Resolved Hospital Problem list   N/A  Assessment & Plan:   Acute hypoxic respiratory failure due to COVID-19 infection COPD  Tobacco use disorder  P -Supplemental  O2 as needed  -IS -Decadron '6mg'$  and remdesivir -Duonebs, albuterol    Acute HFrEF Cardiogenic Shock EF 20-25%m Grade III diastolic dysfunction P -trend coox -d/w Adv HF -- will dc Dopa 1/30 and monitor. If resuming support needed, probably milrinone vs levo   Afib on eliquis P -ICU monitoring -hold eliquis in setting of below -amio -Mag > 2 K> 4  AKI on CKD Hyperkalemia, mild  Hyponatremia, hypervolemic  P -trend renal indices and electrolytes -lasix gtt per  cards   Melena /  blood loss anemia -pt denies EtOH use in past several years -sounds like has had tarry stools x 3 weeks Iron deficiency anemia P -GI saw at [facility] -- consider consult here when stable  -trend H/H replace as needed  -BID PPI   Hematologic abnormalities: +polychromasia  + tear drop cells  + schistocytes  +target cells  +spherocytes + ovalocytes   I am not sure of how these unify. Has Fe deficiency anemia, but don't think that would explain these.  Seems like several disorders like hemolytic anemia, thalassemia, malignancy, hepatic dysfunction bridge some of these abnormalities but not all, and other lab findings would make some of these unlikely.  Direct bili very mildly elevated (0.03 on 1/29) Total Bili normal. Ferritin low end of normal (29) Fe low (13) TIBC upper normal, B12  Normal, folate normal. Macrocytosis Not thrombocytopenic.  High immature retic  No known family hx of heme disorder, clotting disorder, bleeding disorder No known hx of cancers (specifically asked about several non solid tumor)  P -awaiting path review -check haptoglobin, check LDH. Will send coombs -Consult hematology 1/31 -- sooner if pt decompensates/ suspicions grow for worrisome heme process. Schistocytes concerning, not sure where it fits. -abd [place] for distension is pending -- look for splenomegaly, hepatic abnormalities  DM2 -SSI  Best practice:  Diet: NPO Pain/Anxiety/Delirium protocol (if indicated): not indicated VAP protocol (if indicated): Head of bed at 30 degrees DVT prophylaxis: with GIB, SCD GI prophylaxis: PPI BID Glucose control: TBD Mobility: Bed rest Code Status: Full Family Communication: pending 1/30 Disposition: ICU  Labs   CBC: Recent Labs  Lab 08/25/20 1452 08/26/20 0417 08/26/20 2323 08/27/20 0344  WBC 4.4 4.1 4.0 6.4  NEUTROABS 2.6 3.6 3.2 5.7  HGB 6.1* 6.9* 8.7* 8.6*  HCT 22.7* 24.7* 30.8* 28.8*  MCV 60.2* 64.3* 63.4* 63.0*  PLT 206 185  191 [id number]    Basic Metabolic Panel: Recent Labs  Lab 08/25/20 1452 08/26/20 0934 08/26/20 2323 08/27/20 0344  NA 131* 131* 133* 132*  K 5.0 5.5* 5.3* 5.4*  CL 98 97* 97* 96*  CO2 '26 25 24 25  '$ GLUCOSE 94 131* 153* 135*  BUN 41* 44* 43* 43*  CREATININE 2.75* 2.59* 2.10* 2.01*  CALCIUM 8.0* 8.0* 8.2* 8.2*  MG  --  2.1  --  2.0  PHOS  --  5.6*  --  5.0*   GFR: Estimated Creatinine Clearance: 42.7 mL/min (A) (by C-G formula based on SCr of 2.01 mg/dL (H)). Recent Labs  Lab 08/25/20 1452 08/26/20 0417 08/26/20 2323 08/27/20 0344  WBC 4.4 4.1 4.0 6.4  LATICACIDVEN  --   --  1.3 0.9    Liver Function Tests: Recent Labs  Lab 08/25/20 2050 08/26/20 0934 08/26/20 2323 08/27/20 0344  AST 110* 128* 95* 85*  ALT 76* 95* 84* 77*  ALKPHOS 68 67 71 67  BILITOT 0.7 1.4* 1.0 1.2  PROT 6.8 6.8 6.6 6.6  ALBUMIN 3.3* 3.3* 3.2* 3.2*  No results for input(s): LIPASE, AMYLASE in the last 168 hours. No results for input(s): AMMONIA in the last 168 hours.  ABG    Component Value Date/Time   O2SAT 67.7 08/27/2020 0445     Coagulation Profile: Recent Labs  Lab 08/25/20 2050 08/26/20 0417 08/26/20 2323  INR 1.9* 1.8* 1.4*    Cardiac Enzymes: No results for input(s): CKTOTAL, CKMB, CKMBINDEX, TROPONINI in the last 168 hours.  HbA1C: No results found for: HGBA1C  CBG: Recent Labs  Lab 08/25/20 2243 08/26/20 0832 08/26/20 1203 08/26/20 1700  GLUCAP 76 126* 125* 197*    CRITICAL CARE Performed by: Cristal Generous   Total critical care time: 65  minutes  Critical care time was exclusive of separately billable procedures and treating other patients.  Critical care was necessary to treat or prevent imminent or life-threatening deterioration.  Critical care was time spent personally by me on the following activities: development of treatment plan with patient and/or surrogate as well as nursing, discussions with consultants, evaluation of patient's response to  treatment, examination of patient, obtaining history from patient or surrogate, ordering and performing treatments and interventions, ordering and review of laboratory studies, ordering and review of radiographic studies, pulse oximetry and re-evaluation of patient's condition.  Eliseo Gum MSN, AGACNP-BC [place] [id number] If no answer, [id number] 08/27/2020, 7:52 AM

## 2020-08-27 NOTE — Progress Notes (Signed)
Per Cardiology (Dr. Renella Cunas), maintain dopamine at 2.33mg/kg unless MAP falls below 65, then titrate dopamine to 522m/kg.

## 2020-08-27 NOTE — Progress Notes (Signed)
Advanced Heart Failure Rounding Note   Subjective:    Feels better this am. Breathing better. Denies CP or SOB.   On DA 2.5 with co-ox 68% CVP 21. Lactate 0.9   Remains in AF 100-110  hgb 8.6   Objective:   Weight Range:  Vital Signs:   Temp:  [97.6 F (36.4 C)-98.6 F (37 C)] 97.8 F (36.6 C) (01/30 0800) Pulse Rate:  [54-136] 54 (01/30 0900) Resp:  [12-25] 21 (01/30 0900) BP: (79-135)/(46-99) 102/62 (01/30 0900) SpO2:  [86 %-100 %] 97 % (01/30 0900) Arterial Line BP: (82-142)/(52-74) 113/67 (01/30 0900) Weight:  [121.5 kg] 121.5 kg (01/30 0408) Last BM Date: 08/25/20  Weight change: Filed Weights   08/25/20 1403 08/27/20 0408  Weight: 113.4 kg 121.5 kg    Intake/Output:   Intake/Output Summary (Last 24 hours) at 08/27/2020 0949 Last data filed at 08/27/2020 0900 Gross per 24 hour  Intake 1781.19 ml  Output 3945 ml  Net -2163.81 ml     Physical Exam: General:  Obese male lying in bed NAD HEENT: normal Neck: supple. JVP to ear . Carotids 2+ bilat; no bruits. No lymphadenopathy or thryomegaly appreciated. Cor: PMI nondisplaced. Irregular tachy. No rubs, gallops or murmurs. Lungs: coarse Abdomen: obese soft, nontender, +distended. No hepatosplenomegaly. No bruits or masses. Good bowel sounds. Extremities: no cyanosis, clubbing, rash, 3+ edema Neuro: alert & orientedx3, cranial nerves grossly intact. moves all 4 extremities w/o difficulty. Affect pleasant  Telemetry: AF 100-110 Personally reviewed   Labs: Basic Metabolic Panel: Recent Labs  Lab 08/25/20 1452 08/26/20 0934 08/26/20 2323 08/27/20 0344  NA 131* 131* 133* 132*  K 5.0 5.5* 5.3* 5.4*  CL 98 97* 97* 96*  CO2 '26 25 24 25  '$ GLUCOSE 94 131* 153* 135*  BUN 41* 44* 43* 43*  CREATININE 2.75* 2.59* 2.10* 2.01*  CALCIUM 8.0* 8.0* 8.2* 8.2*  MG  --  2.1  --  2.0  PHOS  --  5.6*  --  5.0*    Liver Function Tests: Recent Labs  Lab 08/25/20 2050 08/26/20 0934 08/26/20 2323 08/27/20 0344   AST 110* 128* 95* 85*  ALT 76* 95* 84* 77*  ALKPHOS 68 67 71 67  BILITOT 0.7 1.4* 1.0 1.2  PROT 6.8 6.8 6.6 6.6  ALBUMIN 3.3* 3.3* 3.2* 3.2*   No results for input(s): LIPASE, AMYLASE in the last 168 hours. No results for input(s): AMMONIA in the last 168 hours.  CBC: Recent Labs  Lab 08/25/20 1452 08/26/20 0417 08/26/20 2323 08/27/20 0344  WBC 4.4 4.1 4.0 6.4  NEUTROABS 2.6 3.6 3.2 5.7  HGB 6.1* 6.9* 8.7* 8.6*  HCT 22.7* 24.7* 30.8* 28.8*  MCV 60.2* 64.3* 63.4* 63.0*  PLT 206 185 191 186    Cardiac Enzymes: No results for input(s): CKTOTAL, CKMB, CKMBINDEX, TROPONINI in the last 168 hours.  BNP: BNP (last 3 results) Recent Labs    08/25/20 1452  BNP 527.0*    ProBNP (last 3 results) No results for input(s): PROBNP in the last 8760 hours.    Other results:  Imaging: DG Chest 2 View  Result Date: 08/25/2020 CLINICAL DATA:  Bilateral leg swelling and abdominal swelling for 3 months. History of asthma and CHF. EXAM: CHEST - 2 VIEW COMPARISON:  None. FINDINGS: Cardiac silhouette is mildly enlarged. No mediastinal or hilar masses or evidence of adenopathy. Dense opacity with volume loss lies in the right middle lobe consistent with atelectasis. Additional opacity noted at the right lung base consistent  with atelectasis possibly with a small effusion. Mild linear atelectasis at the lateral left lung base. Mild pleuroparenchymal scarring at the apices. Remainder of the lungs is clear. Elevated right hemidiaphragm. No pneumothorax. Skeletal structures are intact. IMPRESSION: 1. Opacity with volume loss the right middle lobe most consistent with atelectasis. No visualized mass although a central bronchial obstruction is not excluded radiographically. 2. Mild opacity at the base of the right lower lobe, most likely atelectasis. Possible small right pleural effusion. Pneumonia is not excluded. No other evidence of acute cardiopulmonary disease. 3. Mild cardiomegaly.  No evidence  of congestive heart failure. Electronically Signed   By: Lajean Manes M.D.   On: 08/25/2020 15:19   DG CHEST PORT 1 VIEW  Result Date: 08/27/2020 CLINICAL DATA:  Central line placement EXAM: PORTABLE CHEST 1 VIEW COMPARISON:  Cxr 08/25/20 FINDINGS: Interval placement of a left subclavian central venous catheter with tip overlying the expected region of the confluence of the left brachiocephalic vein and superior vena cava. Similar-appearing marked cardiomegaly. Cardiomediastinal silhouette is unchanged. Aortic arch calcification. Redemonstration of right middle lobe opacity. Increased interstitial markings. Suggestion of interval development of an at least trace to small volume right pleural effusion. No definite left pleural effusion. No pneumothorax. No acute osseous abnormality. IMPRESSION: 1. Interval placement of left subclavian central venous catheter in grossly appropriate position. 2. Marked cardiomegaly with pulmonary edema and interval development of an least trace to small volume right pleural effusion. Cannot exclude a pericardial effusion. 3. Bilateral vague patchy airspace opacities could represent atelectasis versus superimposed infection/inflammation. Electronically Signed   By: Iven Finn M.D.   On: 08/27/2020 00:24   ECHOCARDIOGRAM LIMITED  Result Date: 08/26/2020    ECHOCARDIOGRAM LIMITED REPORT   Patient Name:   KHYRE SHETH Date of Exam: 08/26/2020 Medical Rec #:  [id number]     Height:       68.0 in Accession #:    [id number]    Weight:       250.0 lb Date of Birth:  27-Mar-1951     BSA:          2.247 m Patient Age:    70 years      BP:           112/80 mmHg Patient Gender: M             HR:           92 bpm. Exam Location:  [facility] Procedure: Limited Echo, Cardiac Doppler and Color Doppler Indications:    CHF  History:        Patient has no prior history of Echocardiogram examinations.                 CHF, COPD, Signs/Symptoms:LE edema; Risk Factors:Hypertension,                  Diabetes, Dyslipidemia, Former Smoker and Obesity.  Sonographer:    Dustin Flock RDCS Referring Phys: [id number] OLADAPO ADEFESO  Sonographer Comments: Patient is morbidly obese. Image acquisition challenging due to COPD and Image acquisition challenging due to patient body habitus. COVID+ IMPRESSIONS  1. Left ventricular ejection fraction, by estimation, is 20 to 25%. The left ventricle has severely decreased function. The left ventricle demonstrates global hypokinesis. The left ventricular internal cavity size was mildly dilated. Left ventricular diastolic parameters are consistent with Grade III diastolic dysfunction (restrictive). Elevated left atrial pressure.  2. Right ventricular systolic function is severely reduced. The right ventricular size is normal. There  is mildly elevated pulmonary artery systolic pressure. The estimated right ventricular systolic pressure is [id number] mmHg.  3. Left atrial size was moderately dilated.  4. Right atrial size was moderately dilated.  5. A small pericardial effusion is present. The pericardial effusion is circumferential.  6. The mitral valve is normal in structure. No evidence of mitral valve regurgitation.  7. The aortic valve is normal in structure. Aortic valve regurgitation is not visualized. Mild aortic valve sclerosis is present, with no evidence of aortic valve stenosis.  8. The inferior vena cava is dilated in size with <50% respiratory variability, suggesting right atrial pressure of 15 mmHg. FINDINGS  Left Ventricle: Left ventricular ejection fraction, by estimation, is 20 to 25%. The left ventricle has severely decreased function. The left ventricle demonstrates global hypokinesis. The left ventricular internal cavity size was mildly dilated. There is no left ventricular hypertrophy. Left ventricular diastolic parameters are consistent with Grade III diastolic dysfunction (restrictive). Elevated left atrial pressure. Right Ventricle: The right ventricular size  is normal. No increase in right ventricular wall thickness. Right ventricular systolic function is severely reduced. There is mildly elevated pulmonary artery systolic pressure. The tricuspid regurgitant velocity is 2.71 m/s, and with an assumed right atrial pressure of 15 mmHg, the estimated right ventricular systolic pressure is [id number] mmHg. Left Atrium: Left atrial size was moderately dilated. Right Atrium: Right atrial size was moderately dilated. Pericardium: A small pericardial effusion is present. The pericardial effusion is circumferential. Mitral Valve: The mitral valve is normal in structure. Tricuspid Valve: The tricuspid valve is normal in structure. Tricuspid valve regurgitation is trivial. Aortic Valve: The aortic valve is normal in structure. Aortic valve regurgitation is not visualized. Mild aortic valve sclerosis is present, with no evidence of aortic valve stenosis. Pulmonic Valve: The pulmonic valve was not well visualized. Aorta: The aortic root is normal in size and structure. Venous: The inferior vena cava is dilated in size with less than 50% respiratory variability, suggesting right atrial pressure of 15 mmHg. IAS/Shunts: No atrial level shunt detected by color flow Doppler. LEFT VENTRICLE PLAX 2D LVIDd:         5.61 cm  Diastology LVIDs:         4.72 cm  LV e' medial:    4.79 cm/s LV PW:         1.19 cm  LV E/e' medial:  25.3 LV IVS:        1.11 cm  LV e' lateral:   12.70 cm/s LVOT diam:     2.30 cm  LV E/e' lateral: 9.5 LVOT Area:     4.15 cm  LEFT ATRIUM         Index LA diam:    4.80 cm 2.14 cm/m   AORTA Ao Root diam: 3.20 cm MITRAL VALVE                TRICUSPID VALVE MV Area (PHT): 6.37 cm     TR Peak grad:   29.4 mmHg MV Decel Time: 119 msec     TR Vmax:        271.00 cm/s MV E velocity: 121.00 cm/s MV A velocity: 63.70 cm/s   SHUNTS MV E/A ratio:  1.90         Systemic Diam: 2.30 cm Dani Gobble Croitoru MD Electronically signed by Sanda Klein MD Signature Date/Time: 08/26/2020/1:56:00 PM     Final    [place] EKG SITE RITE  Result Date: 08/26/2020 If Site Rite image not attached, placement could  not be confirmed due to current cardiac rhythm.     Medications:     Scheduled Medications: . albuterol  2 puff Inhalation Q6H  . vitamin C  500 mg Oral Daily  . Chlorhexidine Gluconate Cloth  6 each Topical Daily  . dexamethasone (DECADRON) injection  6 mg Intravenous Q24H  . dextromethorphan-guaiFENesin  1 tablet Oral BID  . folic acid  1 mg Oral Daily  . insulin aspart  0-5 Units Subcutaneous QHS  . insulin aspart  0-9 Units Subcutaneous TID WC  . insulin aspart  3 Units Subcutaneous TID WC  . insulin detemir  0.075 Units/kg Subcutaneous BID  . mouth rinse  15 mL Mouth Rinse BID  . pantoprazole (PROTONIX) IV  40 mg Intravenous Q12H  . simvastatin  10 mg Oral q1800  . thiamine  100 mg Oral Daily  . zinc sulfate  220 mg Oral Daily     Infusions: . sodium chloride Stopped (08/25/20 2210)  . DOPamine 2.5 mcg/kg/min (08/27/20 0900)  . furosemide (LASIX) 200 mg in dextrose 5% 100 mL ('2mg'$ /mL) infusion 20 mg/hr (08/27/20 0936)  . [START ON 08/28/2020] remdesivir 100 mg in NS 100 mL       PRN Medications:  acetaminophen, chlorpheniramine-HYDROcodone, guaiFENesin-dextromethorphan, ipratropium-albuterol   Assessment/Plan:    1. New onset biventricular systolic HF with cardiogenic shock - Echo 08/26/20: EF 20-25%, global hypokinesis, GIIIDD, severely RV HK  - suspect may be related to AF (vs ETOH or ischemic) - co-ox 68% on DA 2.5.  - massively volume overloaded with CVP 22 - Will stop DA and follow co-ox. Switch to milrinone or NE as needed.  - Continue to diurese with lasix gtt - Will need R/L cath when more stable and bleeding issues sorted out 2. Acute hypoxic respiratory failure in setting of COVID PNA - Unvaccianted. CXR shows RML/RLL opacity.  Requiring 4L of oxygen via [place].  - treatment per CCM  -Remdesivr -Dexamethesone 6 IV  3. Persistent AF with RVR - has  h/o AF at home. Unclear if this is chronic or not. Given that he is on amio at home suspect not chronic  - start IV amio  - off AC due to GIB 4. GIB/Anemia -On eliquis for afib, presentation hgb 6.1, +FOBT; Ferritin 27, iron 13, folate 10.9.  S/p transfused 3 units of PRBCs -> hgb 8.7 - MCV 63. Will need IV iron  -Hold anticoagulation for now -Consult GI - continue PPI 5. AKI - Likely due to ATN/cardiorenal - Baseline Cr 1.15, peak Cr 2.59 - improving today  6. DM2 - per CCM  7. Hypovolemic Hyponatremic -Na2+ 131 -> 133 -Diuresed and fluid restrict  CRITICAL CARE Performed by: Glori Bickers  Total critical care time: 35 minutes  Critical care time was exclusive of separately billable procedures and treating other patients.  Critical care was necessary to treat or prevent imminent or life-threatening deterioration.  Critical care was time spent personally by me (independent of midlevel providers or residents) on the following activities: development of treatment plan with patient and/or surrogate as well as nursing, discussions with consultants, evaluation of patient's response to treatment, examination of patient, obtaining history from patient or surrogate, ordering and performing treatments and interventions, ordering and review of laboratory studies, ordering and review of radiographic studies, pulse oximetry and re-evaluation of patient's condition.   Length of Stay: 2   Glori Bickers MD 08/27/2020, 9:50 AM  Advanced Heart Failure Team Pager [phone] (M-F; 7a - 4p)  Please contact [facility] for night-coverage after hours (4p -7a ) and weekends on [email or web address]

## 2020-08-28 DIAGNOSIS — K921 Melena: Secondary | ICD-10-CM | POA: Diagnosis not present

## 2020-08-28 DIAGNOSIS — R570 Cardiogenic shock: Secondary | ICD-10-CM | POA: Diagnosis not present

## 2020-08-28 DIAGNOSIS — U071 COVID-19: Secondary | ICD-10-CM | POA: Diagnosis not present

## 2020-08-28 DIAGNOSIS — R195 Other fecal abnormalities: Secondary | ICD-10-CM | POA: Diagnosis not present

## 2020-08-28 DIAGNOSIS — K254 Chronic or unspecified gastric ulcer with hemorrhage: Secondary | ICD-10-CM | POA: Diagnosis not present

## 2020-08-28 LAB — COMPREHENSIVE METABOLIC PANEL
ALT: 60 U/L — ABNORMAL HIGH (ref 0–44)
AST: 54 U/L — ABNORMAL HIGH (ref 15–41)
Albumin: 3.2 g/dL — ABNORMAL LOW (ref 3.5–5.0)
Alkaline Phosphatase: 69 U/L (ref 38–126)
Anion gap: 15 (ref 5–15)
BUN: 38 mg/dL — ABNORMAL HIGH (ref 8–23)
CO2: 33 mmol/L — ABNORMAL HIGH (ref 22–32)
Calcium: 8.5 mg/dL — ABNORMAL LOW (ref 8.9–10.3)
Chloride: 87 mmol/L — ABNORMAL LOW (ref 98–111)
Creatinine, Ser: 1.61 mg/dL — ABNORMAL HIGH (ref 0.61–1.24)
GFR, Estimated: 45 mL/min — ABNORMAL LOW (ref >60)
Glucose, Bld: 140 mg/dL — ABNORMAL HIGH (ref 70–99)
Potassium: 4.2 mmol/L (ref 3.5–5.1)
Sodium: 135 mmol/L (ref 135–145)
Total Bilirubin: 1.4 mg/dL — ABNORMAL HIGH (ref 0.3–1.2)
Total Protein: 6.9 g/dL (ref 6.5–8.1)

## 2020-08-28 LAB — CBC WITH DIFFERENTIAL/PLATELET
Abs Immature Granulocytes: 0.07 10*3/uL (ref 0.00–0.07)
Basophils Absolute: 0 10*3/uL (ref 0.0–0.1)
Basophils Relative: 0 %
Eosinophils Absolute: 0 10*3/uL (ref 0.0–0.5)
Eosinophils Relative: 0 %
HCT: 31.3 % — ABNORMAL LOW (ref 39.0–52.0)
Hemoglobin: 8.9 g/dL — ABNORMAL LOW (ref 13.0–17.0)
Immature Granulocytes: 1 %
Lymphocytes Relative: 4 %
Lymphs Abs: 0.4 10*3/uL — ABNORMAL LOW (ref 0.7–4.0)
MCH: 18 pg — ABNORMAL LOW (ref 26.0–34.0)
MCHC: 28.4 g/dL — ABNORMAL LOW (ref 30.0–36.0)
MCV: 63.4 fL — ABNORMAL LOW (ref 80.0–100.0)
Monocytes Absolute: 0.5 10*3/uL (ref 0.1–1.0)
Monocytes Relative: 5 %
Neutro Abs: 9.4 10*3/uL — ABNORMAL HIGH (ref 1.7–7.7)
Neutrophils Relative %: 90 %
Platelets: 203 10*3/uL (ref 150–400)
RBC: 4.94 MIL/uL (ref 4.22–5.81)
RDW: 25.4 % — ABNORMAL HIGH (ref 11.5–15.5)
WBC: 10.3 10*3/uL (ref 4.0–10.5)
nRBC: 2.6 % — ABNORMAL HIGH (ref 0.0–0.2)

## 2020-08-28 LAB — COOXEMETRY PANEL
Carboxyhemoglobin: 1.5 % (ref 0.5–1.5)
Methemoglobin: 1.1 % (ref 0.0–1.5)
O2 Saturation: 62 %
Total hemoglobin: 9.1 g/dL — ABNORMAL LOW (ref 12.0–16.0)

## 2020-08-28 LAB — D-DIMER, QUANTITATIVE: D-Dimer, Quant: 1.55 ug/mL-FEU — ABNORMAL HIGH (ref 0.00–0.50)

## 2020-08-28 LAB — LACTIC ACID, PLASMA: Lactic Acid, Venous: 1 mmol/L (ref 0.5–1.9)

## 2020-08-28 LAB — FERRITIN: Ferritin: 74 ng/mL (ref 24–336)

## 2020-08-28 LAB — PHOSPHORUS: Phosphorus: 4.1 mg/dL (ref 2.5–4.6)

## 2020-08-28 LAB — MAGNESIUM: Magnesium: 1.7 mg/dL (ref 1.7–2.4)

## 2020-08-28 LAB — C-REACTIVE PROTEIN: CRP: <0.5 mg/dL (ref <1.0)

## 2020-08-28 MED ORDER — DOXYCYCLINE HYCLATE 100 MG PO TABS
100.0000 mg | ORAL_TABLET | Freq: Two times a day (BID) | ORAL | Status: DC
  Administered 2020-08-28 – 2020-08-29 (×3): 100 mg via ORAL
  Filled 2020-08-28 (×4): qty 1

## 2020-08-28 MED ORDER — SODIUM CHLORIDE 0.9 % IV SOLN
100.0000 mg | Freq: Every day | INTRAVENOUS | Status: AC
  Administered 2020-08-28 – 2020-08-29 (×2): 100 mg via INTRAVENOUS
  Filled 2020-08-28 (×2): qty 20

## 2020-08-28 MED ORDER — PANTOPRAZOLE SODIUM 40 MG PO TBEC
40.0000 mg | DELAYED_RELEASE_TABLET | Freq: Two times a day (BID) | ORAL | Status: DC
  Administered 2020-08-28 – 2020-08-29 (×2): 40 mg via ORAL
  Filled 2020-08-28 (×3): qty 1

## 2020-08-28 MED ORDER — MAGNESIUM SULFATE 2 GM/50ML IV SOLN
2.0000 g | Freq: Once | INTRAVENOUS | Status: AC
  Administered 2020-08-28: 2 g via INTRAVENOUS
  Filled 2020-08-28: qty 50

## 2020-08-28 NOTE — Progress Notes (Signed)
eLink Physician-Brief Progress Note Patient Name: Perry Hunter DOB: [birth year] MRN: [id number]   Date of Service  08/28/2020  HPI/Events of Note  COOX = 97.6% --> 63.9% --> 62.0%. BP = 118/79. Hgb = 8.9. CVP = 19.   eICU Interventions  Plan: 1. Lactic Acid level now to assess adequate tissue perfusion.      Intervention Category Major Interventions: Other:  Lysle Dingwall 08/28/2020, 6:24 AM

## 2020-08-28 NOTE — Progress Notes (Signed)
Pharmacist Heart Failure Core Measure Documentation  Assessment: Perry Hunter has an EF documented as 20-25% on 08/26/20 by Echo.  Rationale: Heart failure patients with left ventricular systolic dysfunction (LVSD) and an EF < 40% should be prescribed an angiotensin converting enzyme inhibitor (ACEI) or angiotensin receptor blocker (ARB) at discharge unless a contraindication is documented in the medical record.  This patient is not currently on an ACEI or ARB for HF.  This note is being placed in the record in order to provide documentation that a contraindication to the use of these agents is present for this encounter.  ACE Inhibitor or Angiotensin Receptor Blocker is contraindicated (specify all that apply)  '[]'$   ACEI allergy AND ARB allergy '[]'$   Angioedema '[]'$   Moderate or severe aortic stenosis '[]'$   Hyperkalemia '[x]'$   Hypotension '[]'$   Renal artery stenosis '[]'$   Worsening renal function, preexisting renal disease or dysfunction  Hildred Laser, PharmD Clinical Pharmacist **Pharmacist phone directory can now be found on [email or web address] ([email or web address]).  Listed under [place].

## 2020-08-28 NOTE — Progress Notes (Signed)
NAME:  Perry Hunter, MRN:  [id number], DOB:  29-Jan-1951, LOS: 3 ADMISSION DATE:  08/25/2020, CONSULTATION DATE:  08/26/20 REFERRING MD:  Dr Manuella Ghazi, CHIEF COMPLAINT:  Chf, gib  Brief History   70 yo male with pmh dm2, GERD, tobacco abuse, copd presented to osh with worsening sob/doe and edema as well as black stool x1 month.   History of present illness   70 yo male with pmh HTN, Hyperlipidemia, T2DM, GERD, tobacco abuse, copd, paf on eliquis who presented to osh at the insistence of his family for weeks of increasing abdominal girth/le edema, sob/doe and black tarry stools for 1 month.   Upon presentation pt was found to have hgb of 6, tachycardic, infiltrate on cxr (noted to be covid + as well). He was grossly volume up with hypotension and hemoccult positive.   Pt was started on neo and uptitrated to >162mg, subsequently transitioned to dopamine after conversation with cardiology by Dr SManuella Ghazi Echo was done showing LVEF 20-25% with grade 3 diastolic dysfunction and severe RH dysfunction with RVSP 44.460mg  GI was consulted for his +hemoccult and recommended endoscopy when pt stabilized. At this time he has received 3u prbc.   Pt arrived to our ICU was alert and oriented x2.  On 5 mcg of dopamine.  Satting in 100s on 4 L.  Denied any overt shortness of breath.  Denied any GI symptoms did endorse melena previously consent was obtained from patient and wife for central line and A-line.   Past Medical History   Past Medical History:  Diagnosis Date  . Arthritis   . Asthma   . CHF (congestive heart failure) ([place]  . Diabetes mellitus without complication ([place]  . GERD (gastroesophageal reflux disease)   . Hypercholesteremia   . Hypertension    Also with history of A. fib.  [facility]   [facility]   Procedures:  Central line A-line  Significant Diagnostic Tests:  1/29 echo: LVEF 20-25% grade  III diastolic dysfunction, rh severely dysfunctional.  1/30 abdominal Us>>  Micro Data:  1/28 sars2: positive  Antimicrobials:  N/A  Interim history/subjective:  [facility]  1/30 stopping dopa. Getting Abdominal US[place]ive   Blood pressure 120/74, pulse (!) 101, temperature 97.9 F (36.6 C), temperature source Oral, resp. rate 17, height '5\' 8"'$  (1.727 m), weight 111.3 kg, SpO2 92 %. CVP:  [2 mmHg-37 mmHg] 10 mmHg      Intake/Output Summary (Last 24 hours) at 08/28/2020 0755 Last data filed at 08/28/2020 0630 [place] per 24 hour  Intake 843.74 ml  Output 11575 ml  Net -10731.26 ml   Filed Weights   08/25/20 1403 08/27/20 0408 08/28/20 0330  Weight: 113.4 kg 121.5 kg 111.3 kg    Examination: Constitutional: chronically ill appearing man in NAD  Eyes: EOMI, pupils equal Ears, nose, mouth, and throat: MM dry, trachea midline Cardiovascular: slightly tachycardic, ext warm Respiratory: severe rhonci R>L, bedside US[place]oreaith small effusion unlikely to benefit from tapping Gastrointestinal: Soft, +BS Skin: No rashes, normal turgor Neurologic: moves all 4 ext to command Psychiatric: RASS 0  Cr improved   Resolved Hospital Problem list   N/A  Assessment & Plan:   Acute hypoxic respiratory failure due to COVID-19 infection- incidental, pulmonary edema primary driver here, would do the 3 day ppx dose; usual airborne isolation period.  COPD in flare Tobacco  use disorder  P -Supplemental O2 as needed  -IS -Decadron '6mg'$  -Duonebs, albuterol   -Add doxycycline (long QT) for AECOPD  Acute HFrEF Cardiogenic Shock EF 20-25%m Grade III diastolic dysfunction P -continue levophed titrated to MAP 65 -CHF team following, eventual R/L heart cath once euvolemic  Afib on eliquis P - Continue amiodartone - Maintain normal electrolytes - AC on hold in setting of GIB - Eventual TEE and cardioversion  Cardiorenal syndrome- improved with  lasix gtt - Defer lasix dosing to CHF team  Subacute GIB P -Continue PPI - EGD once off pressors and euvolemic  Hematologic abnormalities: would not worry about in setting of COVID, acute cardiorenal syndrome.  Can recheck diff as OP.  DM2 -SSI  Abnormal chest radiograph- small R effusion exagerrated on film  Best practice:  Diet: NPO Pain/Anxiety/Delirium protocol (if indicated): not indicated VAP protocol (if indicated): Head of bed at 30 degrees DVT prophylaxis: with GIB, SCD GI prophylaxis: PPI BID Glucose control: TBD Mobility: Bed rest Code Status: Full Family Communication: updated patient Disposition: ICU  Next steps: wean pressors, get euvolemic, get EGD, get TEE, LHC/RHC after EGD  Patient critically ill due to shock Interventions to address this today pressor titration Risk of deterioration without these interventions is high  I personally spent 35 minutes providing critical care not including any separately billable procedures  Erskine Emery MD [facility] 08/28/2020 7:23 AM Prefer epic messenger for cross cover needs If after hours, please call E-link

## 2020-08-28 NOTE — H&P (View-Only) (Signed)
[facility] Consult: 1:59 PM 08/28/2020  LOS: 3 days    Referring Provider: CCM NP Georgann Housekeeper. Primary Care Physician:  Neale Burly, MD in [facility] Primary Gastroenterologist:  Dr. Melony Overly.    Reason for Consultation: Melena, cardiology requesting EGD.   HPI: Perry Hunter is a 70 y.o. male.  PMH IDDM.  COPD.  CHF.  Htn.  Obesity.  Atrial fibrillation, chronic Eliquis. Colonoscopy " a few years ago".  Prior charting mentions a date of 01/1999.  Patient has no recall of the details but is pretty sure it is performed in [place].  Presented to the hospital in [place] with 3 months of progressive lower extremity edema, abdominal distention, weight gain.  Progressive DOE even just walking a few feet.  1 month history formed, tarry, black stool without abdominal pain or gross rectal bleeding.  Heartburn controlled with daily omeprazole.  No dysphagia.  No hx PUD.  No NSAIDs.  Hypotensive on arrival, O2 sats 92% on room air. Hgb 6.1, 4 years prior 12.5.  MCV 60.  FOBT positive.  Hyponatremic.  AKI.  FOBT positive.  COVID-19 positive (unvaccinated).   RML/RLL opacification. Discovered to have LVEF of 25 to 74%, grade 3 diastolic dysfunction.  Aggressive diuresis, remdesivir, Levophed, Decadron, IV Protonix, IV amiodarone initiated.  Eliquis on hold due to GI bleed  08/26/2020 inpatient GI consult by Dr. Laural Golden regarding melena, anemia. Dr. Laural Golden planned EGD once the patient was stable. Transfused 2 PRBCs in [place]  Transferred to [place] yesterday.  On CCM service but cardiology/heart failure team following the patient Urine output of almost 10 L.  A. fib present with rates 90-110. Hgb 8.9.  Feeling better. Heart failure team planning right and left heart catheterization once  bleeding issues sorted out and patient more stable. Today the stool has turned brown, soft.  Patient denies nausea, vomiting, abdominal pain,.  At home his appetite was good.  Here in the hospital appetite diminished.  Retired Estate manager/land agent.  Separated from his wife for many years.  His daughter lives with him.  Previous 2 pack/day smoker, now 5 cigarettes/day.  Previous alcohol use but none currently.  He says he had not had a drink for decades and he never was a heavy drinker. Family Hx: No recall of colorectal cancer, peptic ulcer disease, GI bleeds, anemia.  Past Medical History:  Diagnosis Date  . Arthritis   . Asthma   . CHF (congestive heart failure) ([place])   . Diabetes mellitus without complication ([place])   . GERD (gastroesophageal reflux disease)   . Hypercholesteremia   . Hypertension     History reviewed. No pertinent surgical history.  Prior to Admission medications   Medication Sig Start Date End Date Taking? Authorizing Provider  acetaminophen (TYLENOL) 650 MG CR tablet Take 1,300 mg by mouth daily.   Yes [provider]  amiodarone (PACERONE) 200 MG tablet Take 200 mg by mouth daily. 08/16/20  Yes [provider]  benazepril (LOTENSIN) 40 MG tablet Take 40 mg by mouth daily.   Yes [provider]  Cholecalciferol (VITAMIN D3) 1000 units CAPS Take 1 capsule by mouth daily.   Yes [provider]  ELIQUIS 5 MG TABS tablet Take 5 mg by mouth 2 (two) times daily. 08/16/20  Yes [provider]  fluticasone (FLOVENT HFA) 220 MCG/ACT inhaler Inhale 1 puff into the lungs 2 (two) times daily.   Yes [provider]  furosemide (LASIX) 20 MG tablet Take 20 mg by mouth daily. 08/03/20  Yes [provider]  hydrochlorothiazide (HYDRODIURIL) 25 MG tablet Take 25 mg by mouth daily.   Yes [provider]  HYDROcodone-acetaminophen (NORCO/VICODIN) 5-325 MG tablet Take 1 tablet by mouth 3 (three) times daily as needed for pain.  06/12/16  Yes [provider]  insulin detemir (LEVEMIR) 100 UNIT/ML injection Inject 40 Units into the skin at bedtime.   Yes [provider]  metFORMIN (GLUCOPHAGE) 500 MG tablet Take 1,000 mg by mouth daily with breakfast.   Yes [provider]  metoprolol succinate (TOPROL-XL) 25 MG 24 hr tablet Take 25 mg by mouth daily. 08/16/20  Yes [provider]  Omega-3 Fatty Acids (FISH OIL) 1000 MG CAPS Take 1 capsule by mouth daily.   Yes [provider]  omeprazole (PRILOSEC) 40 MG capsule Take 40 mg by mouth daily as needed (acid reflux).   Yes [provider]  PROAIR HFA 108 (90 Base) MCG/ACT inhaler Inhale 2 puffs into the lungs 4 (four) times daily as needed for shortness of breath. 06/28/16  Yes [provider]  simvastatin (ZOCOR) 10 MG tablet Take 10 mg by mouth daily at 6 PM.   Yes [provider]  sitaGLIPtin (JANUVIA) 100 MG tablet Take 100 mg by mouth daily.   Yes [provider]  tamsulosin (FLOMAX) 0.4 MG CAPS capsule Take 0.8 mg by mouth every evening. 08/03/20  Yes [provider]    Scheduled Meds: . albuterol  2 puff Inhalation Q6H  . vitamin C  500 mg Oral Daily  . Chlorhexidine Gluconate Cloth  6 each Topical Daily  . dexamethasone (DECADRON) injection  6 mg Intravenous Q24H  . dextromethorphan-guaiFENesin  1 tablet Oral BID  . doxycycline  100 mg Oral Q12H  . folic acid  1 mg Oral Daily  . insulin aspart  0-5 Units Subcutaneous QHS  . insulin aspart  0-9 Units Subcutaneous TID WC  . insulin aspart  3 Units Subcutaneous TID WC  . insulin detemir  0.075 Units/kg Subcutaneous BID  . mouth rinse  15 mL Mouth Rinse BID  . pantoprazole (PROTONIX) IV  40 mg Intravenous Q12H  . simvastatin  10 mg Oral q1800  . thiamine  100 mg Oral Daily  . zinc sulfate  220 mg Oral Daily   Infusions: . sodium chloride Stopped (08/25/20 2210)  . amiodarone 30 mg/hr (08/28/20 1300)  . furosemide (LASIX) 200  mg in dextrose 5% 100 mL ($Remov'2mg'kRtoYb$ /mL) infusion Stopped (08/28/20 1201)  . norepinephrine (LEVOPHED) Adult infusion 5 mcg/min (08/28/20 1300)  . remdesivir 100 mg in NS 100 mL Stopped (08/28/20 1105)   PRN Meds: acetaminophen, chlorpheniramine-HYDROcodone, guaiFENesin-dextromethorphan, ipratropium-albuterol   Allergies as of 08/25/2020  . (No Known Allergies)    History reviewed. No pertinent family history.  Social History   Socioeconomic History  . Marital status: Married    Spouse name: Not on file  . Number of children: Not on file  . Years of education: Not on file  . Highest education level: Not on file  Occupational History  .  Not on file  Tobacco Use  . Smoking status: Current Every Day Smoker    Types: Cigarettes  . Smokeless tobacco: Never Used  Substance and Sexual Activity  . Alcohol use: Not Currently  . Drug use: Never  . Sexual activity: Not on file  Other Topics Concern  . Not on file  Social History Narrative  . Not on file   Social Determinants of Health   Financial Resource Strain: Not on file  Food Insecurity: Not on file  Transportation Needs: Not on file  Physical Activity: Not on file  Stress: Not on file  Social Connections: Not on file  Intimate Partner Violence: Not on file    REVIEW OF SYSTEMS: Constitutional: Fatigue, weakness.  Hard to say if it is better since he has been in bed this entire admission. ENT:  No nose bleeds Pulm: Breathing is better. CV:  No palpitations, no LE edema.  No angina GU:  No hematuria, no frequency GI: See HPI. Heme: Since admitted, has developed purpura on his arms.  Denies previous history of unusual bleeding or bruising. Transfusions: See HPI. Neuro:  No headaches, no peripheral tingling or numbness Derm:  No itching, no rash or sores.  Endocrine:  No sweats or chills.  No polyuria or dysuria Immunization: See HPI. Travel:  None beyond local counties in last few months.    PHYSICAL EXAM: Vital  signs in last 24 hours: Vitals:   08/28/20 1315 08/28/20 1345  BP: 103/76 115/80  Pulse: 96 (!) 102  Resp: 16 17  Temp:    SpO2: 99% 100%   Wt Readings from Last 3 Encounters:  08/28/20 111.3 kg    General: Obese, comfortable, chronically ill looking Head: No facial asymmetry or swelling.  No signs of head trauma. Eyes: No conjunctival pallor.  No scleral icterus.  EOMI Ears: moderately hard of hearing Nose: No congestion or discharge Mouth: Oropharynx moist, pink, clear.  Tongue midline. Neck: No JVD, no masses, no thyromegaly. Lungs: No labored breathing.  No cough.  Scattered wheezing. Heart: Irregularly irregular rate, rhythm.  No MRG. Abdomen: Obese.  Soft.  Minor tenderness to deep pressure in the right upper quadrant.  No masses, HSM, bruits, hernias..   Rectal: Moderate amount of soft, brown, mushy stool on the bed pad and perianal area.  This is not melenic or obviously bloody.  This was not hemocculted.  No DRE performed. Musc/Skeltl: No joint redness, swelling or gross deformities. Extremities: Lower extremity edema bilaterally.  No lower extremity erythema or cellulitis. Neurologic:   Alert.  Oriented x3.  Appropriate.  Slightly hard of hearing.  Upper extremity tremor more pronounced on the left Skin: No telangiectasia, rash, sores. Nodes: No cervical adenopathy. Psych: Does not affect, calm, in good humor.  Intake/Output from previous day: 01/30 0701 - 01/31 0700 In: 882.1 [I.V.:765.1; IV Piggyback:117] Out: 92010 [OFHQR:97588] Intake/Output this shift: Total I/O In: 412.8 [I.V.:262.9; IV Piggyback:149.9] Out: 1525 [Urine:1525]  LAB RESULTS: Recent Labs    08/27/20 0344 08/27/20 1315 08/28/20 0313  WBC 6.4 6.9 10.3  HGB 8.6* 8.5* 8.9*  HCT 28.8* 28.2* 31.3*  PLT 186 182 203   BMET Lab Results  Component Value Date   NA 135 08/28/2020   NA 133 (L) 08/27/2020   NA 132 (L) 08/27/2020   K 4.2 08/28/2020   K 4.7 08/27/2020   K 5.4 (H) 08/27/2020    CL 87 (L) 08/28/2020   CL 92 (L) 08/27/2020   CL 96 (L) 08/27/2020  CO2 33 (H) 08/28/2020   CO2 28 08/27/2020   CO2 25 08/27/2020   GLUCOSE 140 (H) 08/28/2020   GLUCOSE 120 (H) 08/27/2020   GLUCOSE 135 (H) 08/27/2020   BUN 38 (H) 08/28/2020   BUN 42 (H) 08/27/2020   BUN 43 (H) 08/27/2020   CREATININE 1.61 (H) 08/28/2020   CREATININE 1.90 (H) 08/27/2020   CREATININE 2.01 (H) 08/27/2020   CALCIUM 8.5 (L) 08/28/2020   CALCIUM 8.3 (L) 08/27/2020   CALCIUM 8.2 (L) 08/27/2020   LFT Recent Labs    08/25/20 2050 08/26/20 0934 08/26/20 2323 08/27/20 0344 08/28/20 0313  PROT 6.8   < > 6.6 6.6 6.9  ALBUMIN 3.3*   < > 3.2* 3.2* 3.2*  AST 110*   < > 95* 85* 54*  ALT 76*   < > 84* 77* 60*  ALKPHOS 68   < > 71 67 69  BILITOT 0.7   < > 1.0 1.2 1.4*  BILIDIR 0.3*  --   --   --   --   IBILI 0.4  --   --   --   --    < > = values in this interval not displayed.   PT/INR Lab Results  Component Value Date   INR 1.4 (H) 08/26/2020   INR 1.8 (H) 08/26/2020   INR 1.9 (H) 08/25/2020   Hepatitis Panel Recent Labs    08/27/20 1527  HEPBSAG NON REACTIVE  HCVAB NON REACTIVE  HEPAIGM NON REACTIVE  HEPBIGM NON REACTIVE   C-Diff No components found for: CDIFF Lipase  No results found for: LIPASE  Drugs of Abuse  No results found for: LABOPIA, COCAINSCRNUR, LABBENZ, AMPHETMU, THCU, LABBARB   RADIOLOGY STUDIES: US Abdomen Complete  Result Date: 08/27/2020 CLINICAL DATA:  Abdominal distension EXAM: ABDOMEN ULTRASOUND COMPLETE COMPARISON:  None FINDINGS: Gallbladder: Normally distended without calculi. Mild gallbladder wall thickening present. No pericholecystic fluid or sonographic Murphy sign. Common bile duct: Diameter: 4 mm, normal Liver: Normal echogenicity without mass or nodularity. Portal vein is patent on color Doppler imaging with normal direction of blood flow towards the liver. IVC: Normal appearance Pancreas: Fatty replacement of pancreas. Small portion of pancreatic body  and proximal tail normal appearance, remainder obscured by bowel gas. Spleen: Poorly visualized, due to gas at lung bases and bowel, 8.2 cm length. Right Kidney: Length: 11.8 cm. Normal cortical echogenicity. Diffuse cortical thinning. No mass or hydronephrosis. Left Kidney: Length: 12.2 cm. Cortical thinning. Normal cortical echogenicity. No mass or hydronephrosis. Abdominal aorta: Visualized portions normal caliber, additional portions obscured by bowel gas Other findings: No free fluid IMPRESSION: Mild nonspecific gallbladder wall thickening without calculi, sludge, or sonographic Murphy sign. Incomplete pancreatic and aortic visualization. Remainder of exam unremarkable. Electronically Signed   By: Lavonia Dana M.D.   On: 08/27/2020 10:27   DG CHEST PORT 1 VIEW  Result Date: 08/27/2020 CLINICAL DATA:  Central line placement EXAM: PORTABLE CHEST 1 VIEW COMPARISON:  Cxr 08/25/20 FINDINGS: Interval placement of a left subclavian central venous catheter with tip overlying the expected region of the confluence of the left brachiocephalic vein and superior vena cava. Similar-appearing marked cardiomegaly. Cardiomediastinal silhouette is unchanged. Aortic arch calcification. Redemonstration of right middle lobe opacity. Increased interstitial markings. Suggestion of interval development of an at least trace to small volume right pleural effusion. No definite left pleural effusion. No pneumothorax. No acute osseous abnormality. IMPRESSION: 1. Interval placement of left subclavian central venous catheter in grossly appropriate position. 2. Marked cardiomegaly with pulmonary edema  and interval development of an least trace to small volume right pleural effusion. Cannot exclude a pericardial effusion. 3. Bilateral vague patchy airspace opacities could represent atelectasis versus superimposed infection/inflammation. Electronically Signed   By: Iven Finn M.D.   On: 08/27/2020 00:24      IMPRESSION:   *    Blood loss anemia.  3 PRBCs given at [facility] prior to transfer. Hgb 6.1 >> 8.9.  *   Black, FOBT positive stools for 1 month.  No associated GI symptoms.  Stool today is brown.  No prior EGD.  No NSAIDs.  *    COVID-19 pneumonia.  On remdesivir but no steroids.  *    Abdominal distention.  08/27/2020 abdominal ultrasound with nonspecific GB wall thickening.  No GB stones, sludge.  Incomplete pancreatic and aortic visualization.  However there is some fatty replacement of the pancreas.  Normal liver.  PV patent with normal directional flow.  *    Biventricular heart failure.  Cardiac cath planned once patient stable and has undergone upper endoscopy. Remains on Levophed.  *    Chronic A. fib.  On Eliquis at home, currently on hold due to GI bleed. Remains on IV amiodarone.  *    AKI, improved, not resolved.  *     IDDM.  *   Elevated transaminases, minor elevation T bili, normal alk phos.  Improving Suspect this is from right heart failure.  No evidence of cirrhosis on abdominal ultrasound.   PLAN:     *    EGD, timing per Dr. Rush Landmark.    *   Switch to Protonix 40 mg po bid.     Azucena Freed  08/28/2020, 1:59 PM Phone [phone]

## 2020-08-28 NOTE — Progress Notes (Signed)
[facility] ADULT ICU REPLACEMENT PROTOCOL   The patient does apply for the [facility] Adult ICU Electrolyte Replacment Protocol based on the criteria listed below:   1. Is GFR >/= 30 ml/min? Yes.    Patient's GFR today is 45 2. Is SCr </= 2? Yes.   Patient's SCr is 1.61 ml/kg/hr 3. Did SCr increase >/= 0.5 in 24 hours? No. 4. Abnormal electrolyte(s): Mag 1.7 5. Ordered repletion with: protocol 6. If a panic level lab has been reported, has the CCM MD in charge been notified? Yes.  .   Physician:  Dr Radene Knee, [place] Grumbling 08/28/2020 6:40 AM

## 2020-08-28 NOTE — Progress Notes (Addendum)
Advanced Heart Failure Rounding Note   Subjective:    On NE 5 with co-ox 62% CVP 8. UOP almost 10L.  Remains in AF 90-110  hgb 8.9.   Alert, reports feeling better, did not sleep much last night.  Denies CP or shortness of breath.    Objective:   Weight Range:  Vital Signs:   Temp:  [97.8 F (36.6 C)-98.2 F (36.8 C)] 97.9 F (36.6 C) (01/31 0000) Pulse Rate:  [54-112] 98 (01/31 0300) Resp:  [12-27] 16 (01/31 0300) BP: (55-156)/(40-141) 106/70 (01/31 0245) SpO2:  [89 %-100 %] 95 % (01/31 0300) Arterial Line BP: (72-135)/(44-89) 109/55 (01/31 0300) Weight:  [111.3 kg] 111.3 kg (01/31 0330) Last BM Date: 08/25/20  Weight change: Filed Weights   08/25/20 1403 08/27/20 0408 08/28/20 0330  Weight: 113.4 kg 121.5 kg 111.3 kg    Intake/Output:   Intake/Output Summary (Last 24 hours) at 08/28/2020 0527 Last data filed at 08/28/2020 0300 Gross per 24 hour  Intake 740.76 ml  Output 11450 ml  Net -10709.24 ml     Physical Exam: CVP 8 General:  Well appearing. Ecchymosis to L ACW.  HEENT: normal Neck: supple. No JVD.  Cor: Iregular rate & rhythm. No rubs, gallops or murmurs. Lungs: Wheezing. Abdomen: soft, nontender, nondistended. No hepatosplenomegaly. No bruits or masses. Good bowel sounds. Extremities: no cyanosis, clubbing, rash. + edema Neuro: alert & oriented x 3, moves all 4 extremities w/o difficulty. Affect pleasant   Telemetry: Afib 90-110s. Personally reviewed.    Labs: Basic Metabolic Panel: Recent Labs  Lab 08/26/20 0934 08/26/20 2323 08/27/20 0344 08/27/20 1315 08/28/20 0313  NA 131* 133* 132* 133* 135  K 5.5* 5.3* 5.4* 4.7 4.2  CL 97* 97* 96* 92* 87*  CO2 '25 24 25 28 '$ 33*  GLUCOSE 131* 153* 135* 120* 140*  BUN 44* 43* 43* 42* 38*  CREATININE 2.59* 2.10* 2.01* 1.90* 1.61*  CALCIUM 8.0* 8.2* 8.2* 8.3* 8.5*  MG 2.1  --  2.0 2.0 1.7  PHOS 5.6*  --  5.0*  --  4.1    Liver Function Tests: Recent Labs  Lab 08/25/20 2050 08/26/20 0934  08/26/20 2323 08/27/20 0344 08/28/20 0313  AST 110* 128* 95* 85* 54*  ALT 76* 95* 84* 77* 60*  ALKPHOS 68 67 71 67 69  BILITOT 0.7 1.4* 1.0 1.2 1.4*  PROT 6.8 6.8 6.6 6.6 6.9  ALBUMIN 3.3* 3.3* 3.2* 3.2* 3.2*   No results for input(s): LIPASE, AMYLASE in the last 168 hours. No results for input(s): AMMONIA in the last 168 hours.  CBC: Recent Labs  Lab 08/25/20 1452 08/26/20 0417 08/26/20 2323 08/27/20 0344 08/27/20 1315 08/28/20 0313  WBC 4.4 4.1 4.0 6.4 6.9 10.3  NEUTROABS 2.6 3.6 3.2 5.7  --  9.4*  HGB 6.1* 6.9* 8.7* 8.6* 8.5* 8.9*  HCT 22.7* 24.7* 30.8* 28.8* 28.2* 31.3*  MCV 60.2* 64.3* 63.4* 63.0* 62.1* 63.4*  PLT 206 185 191 186 182 203    Cardiac Enzymes: No results for input(s): CKTOTAL, CKMB, CKMBINDEX, TROPONINI in the last 168 hours.  BNP: BNP (last 3 results) Recent Labs    08/25/20 1452  BNP 527.0*    ProBNP (last 3 results) No results for input(s): PROBNP in the last 8760 hours.    Other results:  Imaging: US Abdomen Complete  Result Date: 08/27/2020 CLINICAL DATA:  Abdominal distension EXAM: ABDOMEN ULTRASOUND COMPLETE COMPARISON:  None FINDINGS: Gallbladder: Normally distended without calculi. Mild gallbladder wall thickening present. No pericholecystic  fluid or sonographic Murphy sign. Common bile duct: Diameter: 4 mm, normal Liver: Normal echogenicity without mass or nodularity. Portal vein is patent on color Doppler imaging with normal direction of blood flow towards the liver. IVC: Normal appearance Pancreas: Fatty replacement of pancreas. Small portion of pancreatic body and proximal tail normal appearance, remainder obscured by bowel gas. Spleen: Poorly visualized, due to gas at lung bases and bowel, 8.2 cm length. Right Kidney: Length: 11.8 cm. Normal cortical echogenicity. Diffuse cortical thinning. No mass or hydronephrosis. Left Kidney: Length: 12.2 cm. Cortical thinning. Normal cortical echogenicity. No mass or hydronephrosis. Abdominal  aorta: Visualized portions normal caliber, additional portions obscured by bowel gas Other findings: No free fluid IMPRESSION: Mild nonspecific gallbladder wall thickening without calculi, sludge, or sonographic Murphy sign. Incomplete pancreatic and aortic visualization. Remainder of exam unremarkable. Electronically Signed   By: Lavonia Dana M.D.   On: 08/27/2020 10:27   DG CHEST PORT 1 VIEW  Result Date: 08/27/2020 CLINICAL DATA:  Central line placement EXAM: PORTABLE CHEST 1 VIEW COMPARISON:  Cxr 08/25/20 FINDINGS: Interval placement of a left subclavian central venous catheter with tip overlying the expected region of the confluence of the left brachiocephalic vein and superior vena cava. Similar-appearing marked cardiomegaly. Cardiomediastinal silhouette is unchanged. Aortic arch calcification. Redemonstration of right middle lobe opacity. Increased interstitial markings. Suggestion of interval development of an at least trace to small volume right pleural effusion. No definite left pleural effusion. No pneumothorax. No acute osseous abnormality. IMPRESSION: 1. Interval placement of left subclavian central venous catheter in grossly appropriate position. 2. Marked cardiomegaly with pulmonary edema and interval development of an least trace to small volume right pleural effusion. Cannot exclude a pericardial effusion. 3. Bilateral vague patchy airspace opacities could represent atelectasis versus superimposed infection/inflammation. Electronically Signed   By: Iven Finn M.D.   On: 08/27/2020 00:24   ECHOCARDIOGRAM LIMITED  Result Date: 08/26/2020    ECHOCARDIOGRAM LIMITED REPORT   Patient Name:   JAKERYAN BURNET Date of Exam: 08/26/2020 Medical Rec #:  [id number]     Height:       68.0 in Accession #:    [id number]    Weight:       250.0 lb Date of Birth:  01-23-1951     BSA:          2.247 m Patient Age:    70 years      BP:           112/80 mmHg Patient Gender: M             HR:           92 bpm. Exam  Location:  [facility] Procedure: Limited Echo, Cardiac Doppler and Color Doppler Indications:    CHF  History:        Patient has no prior history of Echocardiogram examinations.                 CHF, COPD, Signs/Symptoms:LE edema; Risk Factors:Hypertension,                 Diabetes, Dyslipidemia, Former Smoker and Obesity.  Sonographer:    Dustin Flock RDCS Referring Phys: [id number] OLADAPO ADEFESO  Sonographer Comments: Patient is morbidly obese. Image acquisition challenging due to COPD and Image acquisition challenging due to patient body habitus. COVID+ IMPRESSIONS  1. Left ventricular ejection fraction, by estimation, is 20 to 25%. The left ventricle has severely decreased function. The left ventricle demonstrates global hypokinesis. The left ventricular  internal cavity size was mildly dilated. Left ventricular diastolic parameters are consistent with Grade III diastolic dysfunction (restrictive). Elevated left atrial pressure.  2. Right ventricular systolic function is severely reduced. The right ventricular size is normal. There is mildly elevated pulmonary artery systolic pressure. The estimated right ventricular systolic pressure is [id number] mmHg.  3. Left atrial size was moderately dilated.  4. Right atrial size was moderately dilated.  5. A small pericardial effusion is present. The pericardial effusion is circumferential.  6. The mitral valve is normal in structure. No evidence of mitral valve regurgitation.  7. The aortic valve is normal in structure. Aortic valve regurgitation is not visualized. Mild aortic valve sclerosis is present, with no evidence of aortic valve stenosis.  8. The inferior vena cava is dilated in size with <50% respiratory variability, suggesting right atrial pressure of 15 mmHg. FINDINGS  Left Ventricle: Left ventricular ejection fraction, by estimation, is 20 to 25%. The left ventricle has severely decreased function. The left ventricle demonstrates global hypokinesis. The left  ventricular internal cavity size was mildly dilated. There is no left ventricular hypertrophy. Left ventricular diastolic parameters are consistent with Grade III diastolic dysfunction (restrictive). Elevated left atrial pressure. Right Ventricle: The right ventricular size is normal. No increase in right ventricular wall thickness. Right ventricular systolic function is severely reduced. There is mildly elevated pulmonary artery systolic pressure. The tricuspid regurgitant velocity is 2.71 m/s, and with an assumed right atrial pressure of 15 mmHg, the estimated right ventricular systolic pressure is [id number] mmHg. Left Atrium: Left atrial size was moderately dilated. Right Atrium: Right atrial size was moderately dilated. Pericardium: A small pericardial effusion is present. The pericardial effusion is circumferential. Mitral Valve: The mitral valve is normal in structure. Tricuspid Valve: The tricuspid valve is normal in structure. Tricuspid valve regurgitation is trivial. Aortic Valve: The aortic valve is normal in structure. Aortic valve regurgitation is not visualized. Mild aortic valve sclerosis is present, with no evidence of aortic valve stenosis. Pulmonic Valve: The pulmonic valve was not well visualized. Aorta: The aortic root is normal in size and structure. Venous: The inferior vena cava is dilated in size with less than 50% respiratory variability, suggesting right atrial pressure of 15 mmHg. IAS/Shunts: No atrial level shunt detected by color flow Doppler. LEFT VENTRICLE PLAX 2D LVIDd:         5.61 cm  Diastology LVIDs:         4.72 cm  LV e' medial:    4.79 cm/s LV PW:         1.19 cm  LV E/e' medial:  25.3 LV IVS:        1.11 cm  LV e' lateral:   12.70 cm/s LVOT diam:     2.30 cm  LV E/e' lateral: 9.5 LVOT Area:     4.15 cm  LEFT ATRIUM         Index LA diam:    4.80 cm 2.14 cm/m   AORTA Ao Root diam: 3.20 cm MITRAL VALVE                TRICUSPID VALVE MV Area (PHT): 6.37 cm     TR Peak grad:   29.4  mmHg MV Decel Time: 119 msec     TR Vmax:        271.00 cm/s MV E velocity: 121.00 cm/s MV A velocity: 63.70 cm/s   SHUNTS MV E/A ratio:  1.90         Systemic Diam:  2.30 cm Sanda Klein MD Electronically signed by Sanda Klein MD Signature Date/Time: 08/26/2020/1:56:00 PM    Final    [place] EKG SITE RITE  Result Date: 08/26/2020 If Site Rite image not attached, placement could not be confirmed due to current cardiac rhythm.    Medications:     Scheduled Medications: . albuterol  2 puff Inhalation Q6H  . vitamin C  500 mg Oral Daily  . Chlorhexidine Gluconate Cloth  6 each Topical Daily  . dexamethasone (DECADRON) injection  6 mg Intravenous Q24H  . dextromethorphan-guaiFENesin  1 tablet Oral BID  . folic acid  1 mg Oral Daily  . insulin aspart  0-5 Units Subcutaneous QHS  . insulin aspart  0-9 Units Subcutaneous TID WC  . insulin aspart  3 Units Subcutaneous TID WC  . insulin detemir  0.075 Units/kg Subcutaneous BID  . mouth rinse  15 mL Mouth Rinse BID  . pantoprazole (PROTONIX) IV  40 mg Intravenous Q12H  . simvastatin  10 mg Oral q1800  . thiamine  100 mg Oral Daily  . zinc sulfate  220 mg Oral Daily    Infusions: . sodium chloride Stopped (08/25/20 2210)  . amiodarone 30 mg/hr (08/28/20 0300)  . furosemide (LASIX) 200 mg in dextrose 5% 100 mL ('2mg'$ /mL) infusion 20 mg/hr (08/28/20 0300)  . norepinephrine (LEVOPHED) Adult infusion 5 mcg/min (08/28/20 0505)  . remdesivir 100 mg in NS 100 mL      PRN Medications: acetaminophen, chlorpheniramine-HYDROcodone, guaiFENesin-dextromethorphan, ipratropium-albuterol   Assessment/Plan:    1. 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   - Continues to diuresis, UOP 10L over last 24H.  CVP 8.  - Continue to diurese with lasix. - Will need R/L cath when more stable and  bleeding issues sorted out 2. Acute hypoxic respiratory failure in setting of COVID PNA - Unvaccianted. CXR shows RML/RLL opacity.  Requiring 4L of oxygen via [place].  - treatment per CCM  -Remdesivr -Dexamethesone 6 IV  3. Persistent AF with RVR - has h/o AF at home. Unclear if this is chronic or not. Given that he is on amio at home suspect not chronic  - c/w IV amio  - off AC due to GIB - Will need TEE/DC-CV once ok for AC 4. GIB/Anemia -On eliquis for afib, presentation hgb 6.1, +FOBT; Ferritin 27, iron 13, folate 10.9.  S/p transfused 3 units of PRBCs -> hgb 8.7.  MCV 63. - Hgb stable today 8.9 -Hold anticoagulation for now -Consult GI - continue PPI 5. AKI - Likely due to ATN/cardiorenal - Baseline Cr 1.15, peak Cr 2.59 - improving today Cr 1.61 6. DM2 - per CCM  7. Hypovolemic Hyponatremic -Na2+ 131 -> 133 ->135 -Diuresed and fluid restrict  Length of Stay: [place] NP 08/28/2020, 5:27 AM  Advanced Heart Failure Team Pager [phone] (M-F; [place])  Please contact [facility] for night-coverage after hours (4p -7a ) and weekends on [email or web address]   Agree with above. Diuresing very well on lasix gtt at 20. Weight down 22 pounds. CVP 8. Co-ox 62% on NE 5. Remains in AF. Rate improving on IV amio. Not on AC due to GIB. Sodium improved.   General:  Lying in bed. NAD HEENT: normal Neck: supple. JVP 8Carotids 2+ bilat; no bruits. No lymphadenopathy or thryomegaly  appreciated. Cor: PMI nondisplaced. Irregular rate & rhythm. No rubs, gallops or murmurs. Lungs: mild wheezing Abdomen: obese soft, nontender, nondistended. No hepatosplenomegaly. No bruits or masses. Good bowel sounds. Extremities: no cyanosis, clubbing, rash, 1+ edema Neuro: alert & orientedx3, cranial nerves grossly intact. moves all 4 extremities w/o difficulty. Affect pleasant  Will continue lasix gtt until 12 noon then stop. Can then switch to po lasix (vs bolus IV lasix). Add spiro. Wean NE as  tolerated. Will need GI work-up. Once cleared for anti-coagulation will need R/L cath followed by TEE/DC-CV. Management of COVID per CCM.   CRITICAL CARE Performed by: Glori Bickers  Total critical care time: 35 minutes  Critical care time was exclusive of separately billable procedures and treating other patients.  Critical care was necessary to treat or prevent imminent or life-threatening deterioration.  Critical care was time spent personally by me (independent of midlevel providers or residents) on the following activities: development of treatment plan with patient and/or surrogate as well as nursing, discussions with consultants, evaluation of patient's response to treatment, examination of patient, obtaining history from patient or surrogate, ordering and performing treatments and interventions, ordering and review of laboratory studies, ordering and review of radiographic studies, pulse oximetry and re-evaluation of patient's condition.  Glori Bickers, MD  9:05 AM

## 2020-08-28 NOTE — Consult Note (Signed)
[facility] Consult: 1:59 PM 08/28/2020  LOS: 3 days    Referring Provider: CCM NP Georgann Housekeeper. Primary Care Physician:  Neale Burly, MD in [facility]. Primary Gastroenterologist:  Dr. Melony Overly.    Reason for Consultation: Melena, cardiology requesting EGD.   HPI: Perry Hunter is a 70 y.o. male.  PMH IDDM.  COPD.  CHF.  Htn.  Obesity.  Atrial fibrillation, chronic Eliquis. Colonoscopy " a few years ago".  Prior charting mentions a date of 01/1999.  Patient has no recall of the details but is pretty sure it is performed in [place].  Presented to the hospital in [place] with 3 months of progressive lower extremity edema, abdominal distention, weight gain.  Progressive DOE even just walking a few feet.  1 month history formed, tarry, black stool without abdominal pain or gross rectal bleeding.  Heartburn controlled with daily omeprazole.  No dysphagia.  No hx PUD.  No NSAIDs.  Hypotensive on arrival, O2 sats 92% on room air. Hgb 6.1, 4 years prior 12.5.  MCV 60.  FOBT positive.  Hyponatremic.  AKI.  FOBT positive.  COVID-19 positive (unvaccinated).   RML/RLL opacification. Discovered to have LVEF of 25 to 49%, grade 3 diastolic dysfunction.  Aggressive diuresis, remdesivir, Levophed, Decadron, IV Protonix, IV amiodarone initiated.  Eliquis on hold due to GI bleed  08/26/2020 inpatient GI consult by Dr. Laural Golden regarding melena, anemia. Dr. Laural Golden planned EGD once the patient was stable. Transfused 2 PRBCs in [place]  Transferred to [place] yesterday.  On CCM service but cardiology/heart failure team following the patient Urine output of almost 10 L.  A. fib present with rates 90-110. Hgb 8.9.  Feeling better. Heart failure team planning right and left heart catheterization once  bleeding issues sorted out and patient more stable. Today the stool has turned brown, soft.  Patient denies nausea, vomiting, abdominal pain,.  At home his appetite was good.  Here in the hospital appetite diminished.  Retired Estate manager/land agent.  Separated from his wife for many years.  His daughter lives with him.  Previous 2 pack/day smoker, now 5 cigarettes/day.  Previous alcohol use but none currently.  He says he had not had a drink for decades and he never was a heavy drinker. Family Hx: No recall of colorectal cancer, peptic ulcer disease, GI bleeds, anemia.  Past Medical History:  Diagnosis Date  . Arthritis   . Asthma   . CHF (congestive heart failure) ([place])   . Diabetes mellitus without complication ([place])   . GERD (gastroesophageal reflux disease)   . Hypercholesteremia   . Hypertension     History reviewed. No pertinent surgical history.  Prior to Admission medications   Medication Sig Start Date End Date Taking? Authorizing Provider  acetaminophen (TYLENOL) 650 MG CR tablet Take 1,300 mg by mouth daily.   Yes [provider]  amiodarone (PACERONE) 200 MG tablet Take 200 mg by mouth daily. 08/16/20  Yes [provider]  benazepril (LOTENSIN) 40 MG tablet Take 40 mg by mouth daily.   Yes [provider]  Cholecalciferol (VITAMIN D3) 1000 units CAPS Take 1 capsule by mouth daily.   Yes [provider]  ELIQUIS 5 MG TABS tablet Take 5 mg by mouth 2 (two) times daily. 08/16/20  Yes [provider]  fluticasone (FLOVENT HFA) 220 MCG/ACT inhaler Inhale 1 puff into the lungs 2 (two) times daily.   Yes [provider]  furosemide (LASIX) 20 MG tablet Take 20 mg by mouth daily. 08/03/20  Yes [provider]  hydrochlorothiazide (HYDRODIURIL) 25 MG tablet Take 25 mg by mouth daily.   Yes [provider]  HYDROcodone-acetaminophen (NORCO/VICODIN) 5-325 MG tablet Take 1 tablet by mouth 3 (three) times daily as needed for pain.  06/12/16  Yes [provider]  insulin detemir (LEVEMIR) 100 UNIT/ML injection Inject 40 Units into the skin at bedtime.   Yes [provider]  metFORMIN (GLUCOPHAGE) 500 MG tablet Take 1,000 mg by mouth daily with breakfast.   Yes [provider]  metoprolol succinate (TOPROL-XL) 25 MG 24 hr tablet Take 25 mg by mouth daily. 08/16/20  Yes [provider]  Omega-3 Fatty Acids (FISH OIL) 1000 MG CAPS Take 1 capsule by mouth daily.   Yes [provider]  omeprazole (PRILOSEC) 40 MG capsule Take 40 mg by mouth daily as needed (acid reflux).   Yes [provider]  PROAIR HFA 108 (90 Base) MCG/ACT inhaler Inhale 2 puffs into the lungs 4 (four) times daily as needed for shortness of breath. 06/28/16  Yes [provider]  simvastatin (ZOCOR) 10 MG tablet Take 10 mg by mouth daily at 6 PM.   Yes [provider]  sitaGLIPtin (JANUVIA) 100 MG tablet Take 100 mg by mouth daily.   Yes [provider]  tamsulosin (FLOMAX) 0.4 MG CAPS capsule Take 0.8 mg by mouth every evening. 08/03/20  Yes [provider]    Scheduled Meds: . albuterol  2 puff Inhalation Q6H  . vitamin C  500 mg Oral Daily  . Chlorhexidine Gluconate Cloth  6 each Topical Daily  . dexamethasone (DECADRON) injection  6 mg Intravenous Q24H  . dextromethorphan-guaiFENesin  1 tablet Oral BID  . doxycycline  100 mg Oral Q12H  . folic acid  1 mg Oral Daily  . insulin aspart  0-5 Units Subcutaneous QHS  . insulin aspart  0-9 Units Subcutaneous TID WC  . insulin aspart  3 Units Subcutaneous TID WC  . insulin detemir  0.075 Units/kg Subcutaneous BID  . mouth rinse  15 mL Mouth Rinse BID  . pantoprazole (PROTONIX) IV  40 mg Intravenous Q12H  . simvastatin  10 mg Oral q1800  . thiamine  100 mg Oral Daily  . zinc sulfate  220 mg Oral Daily   Infusions: . sodium chloride Stopped (08/25/20 2210)  . amiodarone 30 mg/hr (08/28/20 1300)  . furosemide (LASIX) 200  mg in dextrose 5% 100 mL ($Remov'2mg'dFlvMN$ /mL) infusion Stopped (08/28/20 1201)  . norepinephrine (LEVOPHED) Adult infusion 5 mcg/min (08/28/20 1300)  . remdesivir 100 mg in NS 100 mL Stopped (08/28/20 1105)   PRN Meds: acetaminophen, chlorpheniramine-HYDROcodone, guaiFENesin-dextromethorphan, ipratropium-albuterol   Allergies as of 08/25/2020  . (No Known Allergies)    History reviewed. No pertinent family history.  Social History   Socioeconomic History  . Marital status: Married    Spouse name: Not on file  . Number of children: Not on file  . Years of education: Not on file  . Highest education level: Not on file  Occupational History  .  Not on file  Tobacco Use  . Smoking status: Current Every Day Smoker    Types: Cigarettes  . Smokeless tobacco: Never Used  Substance and Sexual Activity  . Alcohol use: Not Currently  . Drug use: Never  . Sexual activity: Not on file  Other Topics Concern  . Not on file  Social History Narrative  . Not on file   Social Determinants of Health   Financial Resource Strain: Not on file  Food Insecurity: Not on file  Transportation Needs: Not on file  Physical Activity: Not on file  Stress: Not on file  Social Connections: Not on file  Intimate Partner Violence: Not on file    REVIEW OF SYSTEMS: Constitutional: Fatigue, weakness.  Hard to say if it is better since he has been in bed this entire admission. ENT:  No nose bleeds Pulm: Breathing is better. CV:  No palpitations, no LE edema.  No angina GU:  No hematuria, no frequency GI: See HPI. Heme: Since admitted, has developed purpura on his arms.  Denies previous history of unusual bleeding or bruising. Transfusions: See HPI. Neuro:  No headaches, no peripheral tingling or numbness Derm:  No itching, no rash or sores.  Endocrine:  No sweats or chills.  No polyuria or dysuria Immunization: See HPI. Travel:  None beyond local counties in last few months.    PHYSICAL EXAM: Vital  signs in last 24 hours: Vitals:   08/28/20 1315 08/28/20 1345  BP: 103/76 115/80  Pulse: 96 (!) 102  Resp: 16 17  Temp:    SpO2: 99% 100%   Wt Readings from Last 3 Encounters:  08/28/20 111.3 kg    General: Obese, comfortable, chronically ill looking Head: No facial asymmetry or swelling.  No signs of head trauma. Eyes: No conjunctival pallor.  No scleral icterus.  EOMI Ears: moderately hard of hearing Nose: No congestion or discharge Mouth: Oropharynx moist, pink, clear.  Tongue midline. Neck: No JVD, no masses, no thyromegaly. Lungs: No labored breathing.  No cough.  Scattered wheezing. Heart: Irregularly irregular rate, rhythm.  No MRG. Abdomen: Obese.  Soft.  Minor tenderness to deep pressure in the right upper quadrant.  No masses, HSM, bruits, hernias..   Rectal: Moderate amount of soft, brown, mushy stool on the bed pad and perianal area.  This is not melenic or obviously bloody.  This was not hemocculted.  No DRE performed. Musc/Skeltl: No joint redness, swelling or gross deformities. Extremities: Lower extremity edema bilaterally.  No lower extremity erythema or cellulitis. Neurologic:   Alert.  Oriented x3.  Appropriate.  Slightly hard of hearing.  Upper extremity tremor more pronounced on the left Skin: No telangiectasia, rash, sores. Nodes: No cervical adenopathy. Psych: Does not affect, calm, in good humor.  Intake/Output from previous day: 01/30 0701 - 01/31 0700 In: 882.1 [I.V.:765.1; IV Piggyback:117] Out: 00889 [Urine:11875] Intake/Output this shift: Total I/O In: 412.8 [I.V.:262.9; IV Piggyback:149.9] Out: 1525 [Urine:1525]  LAB RESULTS: Recent Labs    08/27/20 0344 08/27/20 1315 08/28/20 0313  WBC 6.4 6.9 10.3  HGB 8.6* 8.5* 8.9*  HCT 28.8* 28.2* 31.3*  PLT 186 182 203   BMET Lab Results  Component Value Date   NA 135 08/28/2020   NA 133 (L) 08/27/2020   NA 132 (L) 08/27/2020   K 4.2 08/28/2020   K 4.7 08/27/2020   K 5.4 (H) 08/27/2020    CL 87 (L) 08/28/2020   CL 92 (L) 08/27/2020   CL 96 (L) 08/27/2020  CO2 33 (H) 08/28/2020   CO2 28 08/27/2020   CO2 25 08/27/2020   GLUCOSE 140 (H) 08/28/2020   GLUCOSE 120 (H) 08/27/2020   GLUCOSE 135 (H) 08/27/2020   BUN 38 (H) 08/28/2020   BUN 42 (H) 08/27/2020   BUN 43 (H) 08/27/2020   CREATININE 1.61 (H) 08/28/2020   CREATININE 1.90 (H) 08/27/2020   CREATININE 2.01 (H) 08/27/2020   CALCIUM 8.5 (L) 08/28/2020   CALCIUM 8.3 (L) 08/27/2020   CALCIUM 8.2 (L) 08/27/2020   LFT Recent Labs    08/25/20 2050 08/26/20 0934 08/26/20 2323 08/27/20 0344 08/28/20 0313  PROT 6.8   < > 6.6 6.6 6.9  ALBUMIN 3.3*   < > 3.2* 3.2* 3.2*  AST 110*   < > 95* 85* 54*  ALT 76*   < > 84* 77* 60*  ALKPHOS 68   < > 71 67 69  BILITOT 0.7   < > 1.0 1.2 1.4*  BILIDIR 0.3*  --   --   --   --   IBILI 0.4  --   --   --   --    < > = values in this interval not displayed.   PT/INR Lab Results  Component Value Date   INR 1.4 (H) 08/26/2020   INR 1.8 (H) 08/26/2020   INR 1.9 (H) 08/25/2020   Hepatitis Panel Recent Labs    08/27/20 1527  HEPBSAG NON REACTIVE  HCVAB NON REACTIVE  HEPAIGM NON REACTIVE  HEPBIGM NON REACTIVE   C-Diff No components found for: CDIFF Lipase  No results found for: LIPASE  Drugs of Abuse  No results found for: LABOPIA, COCAINSCRNUR, LABBENZ, AMPHETMU, THCU, LABBARB   RADIOLOGY STUDIES: US Abdomen Complete  Result Date: 08/27/2020 CLINICAL DATA:  Abdominal distension EXAM: ABDOMEN ULTRASOUND COMPLETE COMPARISON:  None FINDINGS: Gallbladder: Normally distended without calculi. Mild gallbladder wall thickening present. No pericholecystic fluid or sonographic Murphy sign. Common bile duct: Diameter: 4 mm, normal Liver: Normal echogenicity without mass or nodularity. Portal vein is patent on color Doppler imaging with normal direction of blood flow towards the liver. IVC: Normal appearance Pancreas: Fatty replacement of pancreas. Small portion of pancreatic body  and proximal tail normal appearance, remainder obscured by bowel gas. Spleen: Poorly visualized, due to gas at lung bases and bowel, 8.2 cm length. Right Kidney: Length: 11.8 cm. Normal cortical echogenicity. Diffuse cortical thinning. No mass or hydronephrosis. Left Kidney: Length: 12.2 cm. Cortical thinning. Normal cortical echogenicity. No mass or hydronephrosis. Abdominal aorta: Visualized portions normal caliber, additional portions obscured by bowel gas Other findings: No free fluid IMPRESSION: Mild nonspecific gallbladder wall thickening without calculi, sludge, or sonographic Murphy sign. Incomplete pancreatic and aortic visualization. Remainder of exam unremarkable. Electronically Signed   By: Lavonia Dana M.D.   On: 08/27/2020 10:27   DG CHEST PORT 1 VIEW  Result Date: 08/27/2020 CLINICAL DATA:  Central line placement EXAM: PORTABLE CHEST 1 VIEW COMPARISON:  Cxr 08/25/20 FINDINGS: Interval placement of a left subclavian central venous catheter with tip overlying the expected region of the confluence of the left brachiocephalic vein and superior vena cava. Similar-appearing marked cardiomegaly. Cardiomediastinal silhouette is unchanged. Aortic arch calcification. Redemonstration of right middle lobe opacity. Increased interstitial markings. Suggestion of interval development of an at least trace to small volume right pleural effusion. No definite left pleural effusion. No pneumothorax. No acute osseous abnormality. IMPRESSION: 1. Interval placement of left subclavian central venous catheter in grossly appropriate position. 2. Marked cardiomegaly with pulmonary edema  and interval development of an least trace to small volume right pleural effusion. Cannot exclude a pericardial effusion. 3. Bilateral vague patchy airspace opacities could represent atelectasis versus superimposed infection/inflammation. Electronically Signed   By: Iven Finn M.D.   On: 08/27/2020 00:24      IMPRESSION:   *    Blood loss anemia.  3 PRBCs given at [facility] prior to transfer. Hgb 6.1 >> 8.9.  *   Black, FOBT positive stools for 1 month.  No associated GI symptoms.  Stool today is brown.  No prior EGD.  No NSAIDs.  *    COVID-19 pneumonia.  On remdesivir but no steroids.  *    Abdominal distention.  08/27/2020 abdominal ultrasound with nonspecific GB wall thickening.  No GB stones, sludge.  Incomplete pancreatic and aortic visualization.  However there is some fatty replacement of the pancreas.  Normal liver.  PV patent with normal directional flow.  *    Biventricular heart failure.  Cardiac cath planned once patient stable and has undergone upper endoscopy. Remains on Levophed.  *    Chronic A. fib.  On Eliquis at home, currently on hold due to GI bleed. Remains on IV amiodarone.  *    AKI, improved, not resolved.  *     IDDM.  *   Elevated transaminases, minor elevation T bili, normal alk phos.  Improving Suspect this is from right heart failure.  No evidence of cirrhosis on abdominal ultrasound.   PLAN:     *    EGD, timing per Dr. Rush Landmark.    *   Switch to Protonix 40 mg po bid.     Azucena Freed  08/28/2020, 1:59 PM Phone [phone]

## 2020-08-28 NOTE — Plan of Care (Signed)
  Problem: Education: Goal: Knowledge of General Education information will improve Description: Including pain rating scale, medication(s)/side effects and non-pharmacologic comfort measures Outcome: Progressing   Problem: Clinical Measurements: Goal: Ability to maintain clinical measurements within normal limits will improve Outcome: Progressing Goal: Will remain free from infection Outcome: Progressing Goal: Diagnostic test results will improve Outcome: Progressing Goal: Respiratory complications will improve Outcome: Progressing Goal: Cardiovascular complication will be avoided Outcome: Progressing   Problem: Activity: Goal: Risk for activity intolerance will decrease Outcome: Progressing   Problem: Nutrition: Goal: Adequate nutrition will be maintained Outcome: Progressing   Problem: Coping: Goal: Level of anxiety will decrease Outcome: Progressing   Problem: Elimination: Goal: Will not experience complications related to bowel motility Outcome: Progressing Goal: Will not experience complications related to urinary retention Outcome: Progressing   Problem: Pain Managment: Goal: General experience of comfort will improve Outcome: Progressing   Problem: Safety: Goal: Ability to remain free from injury will improve Outcome: Progressing   Problem: Skin Integrity: Goal: Risk for impaired skin integrity will decrease Outcome: Progressing   Problem: Coping: Goal: Psychosocial and spiritual needs will be supported Outcome: Progressing   Problem: Respiratory: Goal: Will maintain a patent airway Outcome: Progressing Goal: Complications related to the disease process, condition or treatment will be avoided or minimized Outcome: Progressing   Problem: Health Behavior/Discharge Planning: Goal: Ability to manage health-related needs will improve Outcome: Not Progressing   Problem: Education: Goal: Knowledge of risk factors and measures for prevention of condition  will improve Outcome: Not Progressing

## 2020-08-29 ENCOUNTER — Encounter (HOSPITAL_COMMUNITY)
Admission: EM | Disposition: A | Discharge status: Home Health | Payer: Self-pay | Source: Home / Self Care | Attending: Internal Medicine

## 2020-08-29 ENCOUNTER — Encounter (HOSPITAL_COMMUNITY): Payer: Self-pay | Admitting: Cardiology

## 2020-08-29 DIAGNOSIS — D509 Iron deficiency anemia, unspecified: Secondary | ICD-10-CM | POA: Diagnosis not present

## 2020-08-29 DIAGNOSIS — K2970 Gastritis, unspecified, without bleeding: Secondary | ICD-10-CM

## 2020-08-29 DIAGNOSIS — K2210 Ulcer of esophagus without bleeding: Secondary | ICD-10-CM

## 2020-08-29 DIAGNOSIS — K921 Melena: Secondary | ICD-10-CM | POA: Diagnosis not present

## 2020-08-29 DIAGNOSIS — K254 Chronic or unspecified gastric ulcer with hemorrhage: Secondary | ICD-10-CM | POA: Diagnosis not present

## 2020-08-29 DIAGNOSIS — R570 Cardiogenic shock: Secondary | ICD-10-CM | POA: Diagnosis not present

## 2020-08-29 HISTORY — PX: ESOPHAGOGASTRODUODENOSCOPY (EGD) WITH PROPOFOL: SHX5813

## 2020-08-29 HISTORY — PX: BIOPSY: SHX5522

## 2020-08-29 LAB — CBC WITH DIFFERENTIAL/PLATELET
Abs Immature Granulocytes: 0.14 10*3/uL — ABNORMAL HIGH (ref 0.00–0.07)
Basophils Absolute: 0 10*3/uL (ref 0.0–0.1)
Basophils Relative: 0 %
Eosinophils Absolute: 0 10*3/uL (ref 0.0–0.5)
Eosinophils Relative: 0 %
HCT: 31 % — ABNORMAL LOW (ref 39.0–52.0)
Hemoglobin: 9.1 g/dL — ABNORMAL LOW (ref 13.0–17.0)
Immature Granulocytes: 1 %
Lymphocytes Relative: 3 %
Lymphs Abs: 0.4 10*3/uL — ABNORMAL LOW (ref 0.7–4.0)
MCH: 18.5 pg — ABNORMAL LOW (ref 26.0–34.0)
MCHC: 29.4 g/dL — ABNORMAL LOW (ref 30.0–36.0)
MCV: 62.9 fL — ABNORMAL LOW (ref 80.0–100.0)
Monocytes Absolute: 0.6 10*3/uL (ref 0.1–1.0)
Monocytes Relative: 5 %
Neutro Abs: 11.1 10*3/uL — ABNORMAL HIGH (ref 1.7–7.7)
Neutrophils Relative %: 91 %
Platelets: 200 10*3/uL (ref 150–400)
RBC: 4.93 MIL/uL (ref 4.22–5.81)
RDW: 26.2 % — ABNORMAL HIGH (ref 11.5–15.5)
WBC: 12.2 10*3/uL — ABNORMAL HIGH (ref 4.0–10.5)
nRBC: 3.4 % — ABNORMAL HIGH (ref 0.0–0.2)

## 2020-08-29 LAB — COMPREHENSIVE METABOLIC PANEL
ALT: 51 U/L — ABNORMAL HIGH (ref 0–44)
AST: 40 U/L (ref 15–41)
Albumin: 3.1 g/dL — ABNORMAL LOW (ref 3.5–5.0)
Alkaline Phosphatase: 64 U/L (ref 38–126)
Anion gap: 14 (ref 5–15)
BUN: 30 mg/dL — ABNORMAL HIGH (ref 8–23)
CO2: 37 mmol/L — ABNORMAL HIGH (ref 22–32)
Calcium: 8.2 mg/dL — ABNORMAL LOW (ref 8.9–10.3)
Chloride: 84 mmol/L — ABNORMAL LOW (ref 98–111)
Creatinine, Ser: 1.31 mg/dL — ABNORMAL HIGH (ref 0.61–1.24)
GFR, Estimated: 58 mL/min — ABNORMAL LOW (ref >60)
Glucose, Bld: 152 mg/dL — ABNORMAL HIGH (ref 70–99)
Potassium: 3.9 mmol/L (ref 3.5–5.1)
Sodium: 135 mmol/L (ref 135–145)
Total Bilirubin: 1.8 mg/dL — ABNORMAL HIGH (ref 0.3–1.2)
Total Protein: 6.5 g/dL (ref 6.5–8.1)

## 2020-08-29 LAB — COOXEMETRY PANEL
Carboxyhemoglobin: 1.4 % (ref 0.5–1.5)
Carboxyhemoglobin: 1.7 % — ABNORMAL HIGH (ref 0.5–1.5)
Methemoglobin: 1.2 % (ref 0.0–1.5)
Methemoglobin: 0.8 % (ref 0.0–1.5)
O2 Saturation: 42.1 %
O2 Saturation: 67.8 %
Total hemoglobin: 9.3 g/dL — ABNORMAL LOW (ref 12.0–16.0)
Total hemoglobin: 8.7 g/dL — ABNORMAL LOW (ref 12.0–16.0)

## 2020-08-29 LAB — GLUCOSE, CAPILLARY
Glucose-Capillary: 182 mg/dL — ABNORMAL HIGH (ref 70–99)
Glucose-Capillary: 159 mg/dL — ABNORMAL HIGH (ref 70–99)
Glucose-Capillary: 168 mg/dL — ABNORMAL HIGH (ref 70–99)
Glucose-Capillary: 121 mg/dL — ABNORMAL HIGH (ref 70–99)
Glucose-Capillary: 122 mg/dL — ABNORMAL HIGH (ref 70–99)
Glucose-Capillary: 173 mg/dL — ABNORMAL HIGH (ref 70–99)
Glucose-Capillary: 192 mg/dL — ABNORMAL HIGH (ref 70–99)
Glucose-Capillary: 125 mg/dL — ABNORMAL HIGH (ref 70–99)
Glucose-Capillary: 175 mg/dL — ABNORMAL HIGH (ref 70–99)
Glucose-Capillary: 198 mg/dL — ABNORMAL HIGH (ref 70–99)
Glucose-Capillary: 102 mg/dL — ABNORMAL HIGH (ref 70–99)
Glucose-Capillary: 153 mg/dL — ABNORMAL HIGH (ref 70–99)
Glucose-Capillary: 137 mg/dL — ABNORMAL HIGH (ref 70–99)

## 2020-08-29 LAB — FERRITIN: Ferritin: 149 ng/mL (ref 24–336)

## 2020-08-29 LAB — MAGNESIUM: Magnesium: 1.9 mg/dL (ref 1.7–2.4)

## 2020-08-29 LAB — PATHOLOGIST SMEAR REVIEW

## 2020-08-29 LAB — C-REACTIVE PROTEIN: CRP: <0.5 mg/dL (ref <1.0)

## 2020-08-29 LAB — D-DIMER, QUANTITATIVE: D-Dimer, Quant: 1.59 ug/mL-FEU — ABNORMAL HIGH (ref 0.00–0.50)

## 2020-08-29 LAB — PHOSPHORUS: Phosphorus: 2.7 mg/dL (ref 2.5–4.6)

## 2020-08-29 LAB — HAPTOGLOBIN: Haptoglobin: 161 mg/dL (ref 34–355)

## 2020-08-29 SURGERY — ESOPHAGOGASTRODUODENOSCOPY (EGD) WITH PROPOFOL
Anesthesia: Moderate Sedation

## 2020-08-29 MED ORDER — MIDAZOLAM HCL (PF) 10 MG/2ML IJ SOLN
INTRAMUSCULAR | Status: DC | PRN
  Administered 2020-08-29 (×4): 1 mg via INTRAVENOUS

## 2020-08-29 MED ORDER — INSULIN DETEMIR 100 UNIT/ML ~~LOC~~ SOLN
9.0000 [IU] | Freq: Two times a day (BID) | SUBCUTANEOUS | Status: DC
  Filled 2020-08-29 (×2): qty 0.09

## 2020-08-29 MED ORDER — FENTANYL CITRATE (PF) 100 MCG/2ML IJ SOLN
INTRAMUSCULAR | Status: AC
  Filled 2020-08-29: qty 4

## 2020-08-29 MED ORDER — MIDAZOLAM HCL (PF) 5 MG/ML IJ SOLN
INTRAMUSCULAR | Status: AC
  Filled 2020-08-29: qty 2

## 2020-08-29 MED ORDER — RAMELTEON 8 MG PO TABS
8.0000 mg | ORAL_TABLET | Freq: Every day | ORAL | Status: DC
  Administered 2020-08-30 – 2020-09-08 (×9): 8 mg via ORAL
  Filled 2020-08-29 (×14): qty 1

## 2020-08-29 MED ORDER — MILRINONE LACTATE IN DEXTROSE 20-5 MG/100ML-% IV SOLN
0.2500 ug/kg/min | INTRAVENOUS | Status: DC
  Administered 2020-08-29: 0.25 ug/kg/min via INTRAVENOUS
  Filled 2020-08-29: qty 100

## 2020-08-29 MED ORDER — SODIUM CHLORIDE 0.9 % IV SOLN
100.0000 mg | Freq: Two times a day (BID) | INTRAVENOUS | Status: AC
  Administered 2020-08-29 – 2020-09-01 (×7): 100 mg via INTRAVENOUS
  Filled 2020-08-29 (×8): qty 100

## 2020-08-29 MED ORDER — SPIRONOLACTONE 12.5 MG HALF TABLET
12.5000 mg | ORAL_TABLET | Freq: Every day | ORAL | Status: DC
  Administered 2020-08-29: 12.5 mg via ORAL
  Filled 2020-08-29: qty 1

## 2020-08-29 MED ORDER — MILRINONE LACTATE IN DEXTROSE 20-5 MG/100ML-% IV SOLN
0.1250 ug/kg/min | INTRAVENOUS | Status: DC
  Administered 2020-08-29 – 2020-09-01 (×5): 0.125 ug/kg/min via INTRAVENOUS
  Filled 2020-08-29 (×5): qty 100

## 2020-08-29 MED ORDER — PANTOPRAZOLE SODIUM 40 MG IV SOLR
40.0000 mg | Freq: Two times a day (BID) | INTRAVENOUS | Status: DC
  Administered 2020-08-29 – 2020-09-08 (×20): 40 mg via INTRAVENOUS
  Filled 2020-08-29 (×20): qty 40

## 2020-08-29 MED ORDER — DIPHENHYDRAMINE HCL 50 MG/ML IJ SOLN
INTRAMUSCULAR | Status: AC
  Filled 2020-08-29: qty 1

## 2020-08-29 MED ORDER — FUROSEMIDE 10 MG/ML IJ SOLN
40.0000 mg | Freq: Two times a day (BID) | INTRAMUSCULAR | Status: AC
  Administered 2020-08-29 (×2): 40 mg via INTRAVENOUS
  Filled 2020-08-29 (×2): qty 4

## 2020-08-29 MED ORDER — MAGNESIUM SULFATE 2 GM/50ML IV SOLN
2.0000 g | Freq: Once | INTRAVENOUS | Status: AC
  Administered 2020-08-29: 2 g via INTRAVENOUS
  Filled 2020-08-29: qty 50

## 2020-08-29 MED ORDER — FENTANYL CITRATE (PF) 100 MCG/2ML IJ SOLN
INTRAMUSCULAR | Status: DC | PRN
  Administered 2020-08-29 (×4): 25 ug via INTRAVENOUS

## 2020-08-29 SURGICAL SUPPLY — 15 items

## 2020-08-29 NOTE — Progress Notes (Signed)
eLink Physician-Brief Progress Note Patient Name: Fawkes Pivarnik DOB: [birth year] MRN: [id number]   Date of Service  08/29/2020  HPI/Events of Note  S/P EGD, notes seen.  RN asking for IV route for meds.   eICU Interventions  - changed doxy and protonix to IV for now. Would avoid doxy oral due to esophagitis.     Intervention Category Intermediate Interventions: Other:;Bleeding - evaluation and treatment with blood products  Elmer Sow 08/29/2020, 10:03 PM

## 2020-08-29 NOTE — Progress Notes (Signed)
[facility] ADULT ICU REPLACEMENT PROTOCOL   The patient does apply for the [facility] Adult ICU Electrolyte Replacment Protocol based on the criteria listed below:   1. Is GFR >/= 30 ml/min? Yes.    Patient's GFR today is 58 2. Is SCr </= 2? Yes.   Patient's SCr is 1.31 ml/kg/hr 3. Did SCr increase >/= 0.5 in 24 hours? No. 4. Abnormal electrolyte(s): Mag 1.9. Ordered repletion with: protocol 6. If a panic level lab has been reported, has the CCM MD in charge been notified? Yes.  .   Physician:  Dr. Radene Knee, [place] Grumbling 08/29/2020 5:49 AM

## 2020-08-29 NOTE — Progress Notes (Signed)
  Milrinone 0.125 mcg added this morning.   I/O not accurate. CVP 10-11. CO- OX now up to 68% . Continue milrinone.   Teliah Buffalo NP-C  2:41 PM

## 2020-08-29 NOTE — Progress Notes (Addendum)
Advanced Heart Failure Rounding Note   Subjective:    On NE 7 with co-ox 42% CVP 12. UOP -3L.  Remains in AF 100-110s.   Perry Hunter laying in bed, alert and oriented to self, place, year and situation.  At times he can get ornery at times, wanting to leave but has no other complaints.  Denies chest pain, shortness of breath or abd pain.  He did remove his O2 n/c and pulse ox sat dropped to 78%.     Objective:   Weight Range:  Vital Signs:   Temp:  [97.9 F (36.6 C)-98.3 F (36.8 C)] 98.3 F (36.8 C) (02/01 0000) Pulse Rate:  [25-132] 104 (02/01 0000) Resp:  [12-32] 18 (02/01 0000) BP: (75-147)/(32-88) 102/72 (02/01 0000) SpO2:  [81 %-100 %] 98 % (02/01 0000) Arterial Line BP: (80-127)/(45-71) 113/59 (02/01 0000) Weight:  [111.3 kg] 111.3 kg (01/31 0330) Last BM Date: 08/25/20  Weight change: Filed Weights   08/25/20 1403 08/27/20 0408 08/28/20 0330  Weight: 113.4 kg 121.5 kg 111.3 kg    Intake/Output:   Intake/Output Summary (Last 24 hours) at 08/29/2020 0143 Last data filed at 08/29/2020 0000 Gross per 24 hour  Intake 1128.81 ml  Output 6126 ml  Net -4997.19 ml     Physical Exam: CVP 12 General:  Chronic ill appearing.  HEENT: Bruising to left anterior chest wall.  Neck: supple. + JVD.  Cor: Iregular rate & rhythm. No rubs, gallops or murmurs. Lungs: Expiratory wheezing.  Abdomen: soft, nontender, nondistended. Good bowel sounds. Extremities: no cyanosis, clubbing, rash, + BLE edema Neuro: alert & oriented x 3, moves all 4 extremities w/o difficulty. Ornery at times.   Telemetry: Afib with rates 100-110s.  Personally reviewed.    Labs: Basic Metabolic Panel: Recent Labs  Lab 08/26/20 0934 08/26/20 2323 08/27/20 0344 08/27/20 1315 08/28/20 0313  NA 131* 133* 132* 133* 135  K 5.5* 5.3* 5.4* 4.7 4.2  CL 97* 97* 96* 92* 87*  CO2 '25 24 25 28 '$ 33*  GLUCOSE 131* 153* 135* 120* 140*  BUN 44* 43* 43* 42* 38*  CREATININE 2.59* 2.10* 2.01* 1.90* 1.61*   CALCIUM 8.0* 8.2* 8.2* 8.3* 8.5*  MG 2.1  --  2.0 2.0 1.7  PHOS 5.6*  --  5.0*  --  4.1    Liver Function Tests: Recent Labs  Lab 08/25/20 2050 08/26/20 0934 08/26/20 2323 08/27/20 0344 08/28/20 0313  AST 110* 128* 95* 85* 54*  ALT 76* 95* 84* 77* 60*  ALKPHOS 68 67 71 67 69  BILITOT 0.7 1.4* 1.0 1.2 1.4*  PROT 6.8 6.8 6.6 6.6 6.9  ALBUMIN 3.3* 3.3* 3.2* 3.2* 3.2*   No results for input(s): LIPASE, AMYLASE in the last 168 hours. No results for input(s): AMMONIA in the last 168 hours.  CBC: Recent Labs  Lab 08/25/20 1452 08/26/20 0417 08/26/20 2323 08/27/20 0344 08/27/20 1315 08/28/20 0313  WBC 4.4 4.1 4.0 6.4 6.9 10.3  NEUTROABS 2.6 3.6 3.2 5.7  --  9.4*  HGB 6.1* 6.9* 8.7* 8.6* 8.5* 8.9*  HCT 22.7* 24.7* 30.8* 28.8* 28.2* 31.3*  MCV 60.2* 64.3* 63.4* 63.0* 62.1* 63.4*  PLT 206 185 191 186 182 203    Cardiac Enzymes: No results for input(s): CKTOTAL, CKMB, CKMBINDEX, TROPONINI in the last 168 hours.  BNP: BNP (last 3 results) Recent Labs    08/25/20 1452  BNP 527.0*    ProBNP (last 3 results) No results for input(s): PROBNP in the last 8760 hours.  Other results:  Imaging: US Abdomen Complete  Result Date: 08/27/2020 CLINICAL DATA:  Abdominal distension EXAM: ABDOMEN ULTRASOUND COMPLETE COMPARISON:  None FINDINGS: Gallbladder: Normally distended without calculi. Mild gallbladder wall thickening present. No pericholecystic fluid or sonographic Murphy sign. Common bile duct: Diameter: 4 mm, normal Liver: Normal echogenicity without mass or nodularity. Portal vein is patent on color Doppler imaging with normal direction of blood flow towards the liver. IVC: Normal appearance Pancreas: Fatty replacement of pancreas. Small portion of pancreatic body and proximal tail normal appearance, remainder obscured by bowel gas. Spleen: Poorly visualized, due to gas at lung bases and bowel, 8.2 cm length. Right Kidney: Length: 11.8 cm. Normal cortical echogenicity.  Diffuse cortical thinning. No mass or hydronephrosis. Left Kidney: Length: 12.2 cm. Cortical thinning. Normal cortical echogenicity. No mass or hydronephrosis. Abdominal aorta: Visualized portions normal caliber, additional portions obscured by bowel gas Other findings: No free fluid IMPRESSION: Mild nonspecific gallbladder wall thickening without calculi, sludge, or sonographic Murphy sign. Incomplete pancreatic and aortic visualization. Remainder of exam unremarkable. Electronically Signed   By: Lavonia Dana M.D.   On: 08/27/2020 10:27     Medications:     Scheduled Medications: . albuterol  2 puff Inhalation Q6H  . vitamin C  500 mg Oral Daily  . Chlorhexidine Gluconate Cloth  6 each Topical Daily  . dexamethasone (DECADRON) injection  6 mg Intravenous Q24H  . dextromethorphan-guaiFENesin  1 tablet Oral BID  . doxycycline  100 mg Oral Q12H  . folic acid  1 mg Oral Daily  . insulin aspart  0-5 Units Subcutaneous QHS  . insulin aspart  0-9 Units Subcutaneous TID WC  . insulin aspart  3 Units Subcutaneous TID WC  . insulin detemir  0.075 Units/kg Subcutaneous BID  . mouth rinse  15 mL Mouth Rinse BID  . pantoprazole  40 mg Oral BID  . simvastatin  10 mg Oral q1800  . thiamine  100 mg Oral Daily  . zinc sulfate  220 mg Oral Daily    Infusions: . sodium chloride Stopped (08/25/20 2210)  . amiodarone 30 mg/hr (08/29/20 0000)  . furosemide (LASIX) 200 mg in dextrose 5% 100 mL ('2mg'$ /mL) infusion Stopped (08/28/20 1201)  . norepinephrine (LEVOPHED) Adult infusion 9 mcg/min (08/29/20 0000)  . remdesivir 100 mg in NS 100 mL Stopped (08/28/20 1105)    PRN Medications: acetaminophen, chlorpheniramine-HYDROcodone, guaiFENesin-dextromethorphan, ipratropium-albuterol   Assessment/Plan:    1. 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  CVP 12.  - Add spiro 12.5 and lasix 40 mg IV BID x 2 doses.  - Start milrinone at 0.25, repeat co-ox at noon.   - Will need R/L cath when more stable and bleeding issues sorted out Acute hypoxic respiratory failure in setting of COVID PNA /AECOPD - Unvaccianted. CXR shows RML/RLL opacity.  Requiring 3L of oxygen via [place].  - Treatment per CCM: Remdesivr, Dexamethesone 6 IV, neb and doxycycline.   3. Persistent AF with RVR - has h/o AF at home. Unclear if this is chronic or not. Given that he is on amio at home suspect not chronic  - c/w IV amio  - off AC due to GIB - Will need TEE/DC-CV once okay from GI perspective and after L/RHC 4. GIB/Anemia -On eliquis for afib, presentation hgb 6.1, +FOBT; Ferritin 27,  iron 13, folate 10.9.  S/p transfused 3 units of PRBCs -> hgb 8.7.  MCV 63. -Hgb pending -Hold anticoagulation for now - Consult GI - continue PPI 5. AKI - Likely due to ATN/cardiorenal - Baseline Cr 1.15, peak Cr 2.59 - improving today Cr 1.31 6. DM2 - per CCM  7. Hypovolemic Hyponatremic -Na2+ 131 -> 133 ->135 ->135 -Diuresed and fluid restrict  Length of Stay: [place] NP 08/29/2020, 1:43 AM  Advanced Heart Failure Team Pager [phone] (M-F; 7a - 4p)  Please contact [facility] for night-coverage after hours (4p -7a ) and weekends on [email or web address]  Agree with above.   Remains very tenuous. Sats drop easily. Co-ox low on NE at 9. Add milrinone 0.125. CVP climbing back yp. Remains in AF with RVR despite IV amio. No further GI bleeding. Pending scope. Confused at times.   General: Ill appearing + wheezing HEENT: normal Neck: supple. JVP up. Carotids 2+ bilat; no bruits. No lymphadenopathy or thryomegaly appreciated. Cor: PMI nondisplaced. Irregular tachy. No rubs, gallops or murmurs. Lungs: + wheeze  Abdomen: obese soft, nontender, nondistended. No hepatosplenomegaly. No bruits or masses. Good bowel sounds. Extremities: no cyanosis, clubbing,  rash, trace-1+ edema Neuro: alert & orientedx3, cranial nerves grossly intact. moves all 4 extremities w/o difficulty. Affect pleasant  Will add milrinone. Restart IV diuretics today. Continue amio. Will need R/L cath followed by TEE/DCCV when cleared by GI to start anticoagulation. Treatment of COVID per CCM.  CRITICAL CARE Performed by: Glori Bickers  Total critical care time: 35 minutes  Critical care time was exclusive of separately billable procedures and treating other patients.  Critical care was necessary to treat or prevent imminent or life-threatening deterioration.  Critical care was time spent personally by me (independent of midlevel providers or residents) on the following activities: development of treatment plan with patient and/or surrogate as well as nursing, discussions with consultants, evaluation of patient's response to treatment, examination of patient, obtaining history from patient or surrogate, ordering and performing treatments and interventions, ordering and review of laboratory studies, ordering and review of radiographic studies, pulse oximetry and re-evaluation of patient's condition.  Glori Bickers, MD  4:54 AM

## 2020-08-29 NOTE — Plan of Care (Signed)

## 2020-08-29 NOTE — Interval H&P Note (Signed)
History and Physical Interval Note:  08/29/2020 3:37 PM  Perry Hunter  has presented today for surgery, with the diagnosis of Anemia.  GI bleed..  The various methods of treatment have been discussed with the patient and family. After consideration of risks, benefits and other options for treatment, the patient has consented to  Procedure(s): ESOPHAGOGASTRODUODENOSCOPY (EGD) WITH PROPOFOL (N/A) as a surgical intervention.  The patient's history has been reviewed, patient examined, no change in status, stable for surgery.  I have reviewed the patient's chart and labs.  Questions were answered to the patient's satisfaction.     [employer]

## 2020-08-29 NOTE — Op Note (Signed)
[name] Procedure Date : 08/29/2020 MRN: [id number] Attending MD: Justice Britain , MD Date of Birth: [birth year] CSN: [id number] Age: 70 Admit Type: Inpatient Procedure:                Upper GI endoscopy Indications:              Acute post hemorrhagic anemia, Unexplained iron                            deficiency anemia, Melena Providers:                Justice Britain, MD, Josie Dixon, RN,                            Laverda Sorenson, Technician, Tyrone Apple,                            Technician Referring MD:             Hildred Laser, MD, ICU Team, Shaune Pascal. Bensimhom, MD Medicines:                Fentanyl 100 micrograms IV, Midazolam 4 mg IV Complications:            No immediate complications. Estimated Blood Loss:     Estimated blood loss was minimal. Procedure:                Pre-Anesthesia Assessment:                           - Prior to the procedure, a History and Physical                            was performed, and patient medications and                            allergies were reviewed. The patient's tolerance of                            previous anesthesia was also reviewed. The risks                            and benefits of the procedure and the sedation                            options and risks were discussed with the patient.                            All questions were answered, and informed consent                            was obtained. Prior Anticoagulants: The patient has                            taken Eliquis (apixaban), last dose was 5 days  prior to procedure. ASA Grade Assessment: IV - A                            patient with severe systemic disease that is a                            constant threat to life. After reviewing the risks                            and benefits, the patient was deemed in                            satisfactory condition to undergo  the procedure.                           After obtaining informed consent, the endoscope was                            passed under direct vision. Throughout the                            procedure, the patient's blood pressure, pulse, and                            oxygen saturations were monitored continuously. The                            GIF-H190 ([id number]) Olympus gastroscope was                            introduced through the mouth, and advanced to the                            second part of duodenum. The upper GI endoscopy was                            accomplished without difficulty. The patient                            tolerated the procedure. Scope In: Scope Out: Findings:      No gross lesions were noted in the proximal esophagus.      White nummular lesions were noted in the mid esophagus - possible       esophageal candidiasis.      LA Grade D (one or more mucosal breaks involving at least 75% of       esophageal circumference) esophagitis (nearly black esophagus) with mild       oozing/significant friability was found 32 to 42 cm from the incisors       (distal esophagus).      Biopsies were taken with a cold forceps for histology from the entirety       of the esophagus with main focus being in the distal esophagus.      The Z-line was irregular and was found 42 cm from the incisors.  A 2 cm hiatal hernia was present.      Segmental mild inflammation characterized by congestion (edema),       erosions, erythema and granularity was found in the entire examined       stomach.      No other gross lesions were noted in the entire examined stomach.       Biopsies were taken with a cold forceps for histology and Helicobacter       pylori testing.      No gross lesions were noted in the duodenal bulb, in the first portion       of the duodenum and in the second portion of the duodenum. Biopsies for       histology were taken with a cold forceps for evaluation of  celiac       disease. Impression:               - No gross lesions in esophagus proximally. White                            nummular lesions in esophageal mucosa in middle                            esophagus. LA Grade D erosive esophagitis with                            bleeding. Biopsied entire esophagus.                           - Z-line irregular, 42 cm from the incisors.                           - 2 cm hiatal hernia.                           - Gastritis. No other gross lesions in the stomach.                            Biopsied.                           - No gross lesions in the duodenal bulb, in the                            first portion of the duodenum and in the second                            portion of the duodenum. Biopsied. Moderate Sedation:      Moderate (conscious) sedation was administered by the endoscopy nurse       and supervised by the endoscopist. The following parameters were       monitored: oxygen saturation, heart rate, blood pressure, and response       to care. Total physician intraservice time was 28 minutes. Recommendation:           - The patient will be observed post-procedure,  until all discharge criteria are met.                           - Return patient to care of the ICU.                           - IV Protonix 40 mg BID should be continued.                           - Recommend initiation of Carafate 1 g QID.                           - Clear liquid diet is OK if patient can tolerate.                           - I do not see a contraindication to NGT or Cortrak                            placement if optimal nutrition is necessary,                            preference Cortrak however.                           - Observe patient's clinical course.                           - Await pathology results.                           - The patient is not a candidate for Colonoscopy at                            this time.  If further evaluation to ensure that a                            large mass or lesion is not present such that                            further evaluation would be required before                            consideration of RHC/LHC and anticoagulation needs,                            then would recommend a CT-AP with IV/PO contrast                            when felt to be safe to administer for best attempt                            at further characterization (maybe this can be  obtained in the next 24-36 hours to ensure as much                            clinical safetly prior to potential need for larger                            doses of anticoagulation.                           - Patient will be at risk of bleeding from this                            particular region for awhile, but with acid                            stabilization and carafate hopefully we can heal                            this area more quickly but ultimately if able to                            hold anticoagulation longer up to a week then may                            be most beneficial for bleeding, but not clear this                            will be possible with his current Atrial                            fibrillation needs. Thus risk/benefit of                            starting/stopping will be deferred to the ICU and                            HF teams, but I believe, if high enough                            risk/concern is present, then if anticoagulation is                            necessary consider heparin drip without bolus in                            6-12 hours and monitor very closely.                           - The findings and recommendations were discussed                            with the patient.                           -  The findings and recommendations were discussed                            with the referring physician. Procedure  Code(s):        --- Professional ---                           [phone], Esophagogastroduodenoscopy, flexible,                            transoral; with biopsy, single or multiple                           99153, Moderate sedation; each additional 15                            minutes intraservice time                           G0500, Moderate sedation services provided by the                            same physician or other qualified health care                            professional performing a gastrointestinal                            endoscopic service that sedation supports,                            requiring the presence of an independent trained                            observer to assist in the monitoring of the                            patient's level of consciousness and physiological                            status; initial 15 minutes of intra-service time;                            patient age 72 years or older (additional time may                            be reported with [phone], as appropriate) Diagnosis Code(s):        --- Professional ---                           K22.8, Other specified diseases of esophagus                           K20.81, Other esophagitis with bleeding  K44.9, Diaphragmatic hernia without obstruction or                            gangrene                           K29.70, Gastritis, unspecified, without bleeding                           D62, Acute posthemorrhagic anemia                           D50.9, Iron deficiency anemia, unspecified                           K92.1, Melena (includes Hematochezia) CPT copyright 2019 [employer]. All rights reserved. The codes documented in this report are preliminary and upon coder review may  be revised to meet current compliance requirements. Justice Britain, MD 08/29/2020 6:20:28 PM Number of Addenda: 0

## 2020-08-29 NOTE — Progress Notes (Signed)
NAME:  Perry Hunter, MRN:  [id number], DOB:  02/15/1951, LOS: 4 ADMISSION DATE:  08/25/2020, CONSULTATION DATE:  08/26/20 REFERRING MD:  Dr Manuella Ghazi, CHIEF COMPLAINT:  Chf, gib  Brief History   70 yo male with pmh dm2, GERD, tobacco abuse, copd presented to osh with worsening sob/doe and edema as well as black stool x1 month.   History of present illness   70 yo male with pmh HTN, Hyperlipidemia, T2DM, GERD, tobacco abuse, copd, paf on eliquis who presented to osh at the insistence of his family for weeks of increasing abdominal girth/le edema, sob/doe and black tarry stools for 1 month.   Upon presentation pt was found to have hgb of 6, tachycardic, infiltrate on cxr (noted to be covid + as well). He was grossly volume up with hypotension and hemoccult positive.   Pt was started on neo and uptitrated to >176mg, subsequently transitioned to dopamine after conversation with cardiology by Dr SManuella Ghazi Echo was done showing LVEF 20-25% with grade 3 diastolic dysfunction and severe RH dysfunction with RVSP 44.49mg  GI was consulted for his +hemoccult and recommended endoscopy when pt stabilized. At this time he has received 3u prbc.   Pt arrived to our ICU was alert and oriented x2.  On 5 mcg of dopamine.  Satting in 100s on 4 L.  Denied any overt shortness of breath.  Denied any GI symptoms did endorse melena previously consent was obtained from patient and wife for central line and A-line.   Past Medical History   Past Medical History:  Diagnosis Date  . Arthritis   . Asthma   . CHF ([place]estive heart failure) ([place]  . Diabetes mellitus without complication ([place]  . GERD (gastroesophageal reflux disease)   . Hypercholesteremia   . Hypertension    Also with history [facility] fib.  [facility]   [facility] [place]   Procedures:  Central line A-line  Significant Diagnostic Tests:  1/29 echo: LVEF 20-25% grade  III diastolic dysfunction, rh severely dysfunctional.  1/30 abdominal Us>>  Micro Data:  1/28 sars2: positive  Antimicrobials:  N/A  Interim history/subjective:  Slept poorly, delirious Good Uop SvO2 low, started on milrinone Tentatively for EGD today  Objective   Blood pressure 108/79, pulse 100, temperature 98 F (36.7 C), temperature source Oral, resp. rate 12, height '5\' 8"'$  (1.727 m), weight 109.8 kg, SpO2 98 %. CVP:  [9 mmHg-18 mmHg] 12 mmHg      Intake/Output Summary (Last 24 hours) at 08/29/2020 0750 Last data filed at 08/29/2020 0600 Gross per 24 hour  Intake 1158.3 ml  Output 3576 ml  Net -2417.7 ml   Filed Weights   08/27/20 0408 08/28/20 0330 08/29/20 0500  Weight: 121.5 kg 111.3 kg 109.8 kg    Examination: Constitutional: chronically ill appearing man sleeping in bed  Eyes: sleeping Ears, nose, mouth, and throat: MMM, trachea midline Cardiovascular: irregular, ext warm Respiratory: Crackles bases, otherwise clear (wheezing gone from yesterday), no accessory muscle use Gastrointestinal: soft, +BS Skin: No rashes, normal turgor Neurologic: sleeping, will re eval at noon Psychiatric: defer to noon, was AOx3 early this AM with CHF eval  Sodium improved, Cr improved, transaminitis improved Lactate and CRP benign Ferritin okay  Resolved Hospital Problem list   N/A  Assessment & Plan:   Acute hypoxic respiratory failure due to COVID-19 infection- incidental, pulmonary edema primary driver here, would do the 3 day  ppx dose; usual airborne isolation period.  COPD in flare Tobacco use disorder  P -Supplemental O2 as needed  -IS -DC steroids due to deliriogenic effect and negative inflammatory markers, improved wheezing -Duonebs, albuterol   -Continue doxycycline x 7 days  Acute HFrEF Cardiogenic Shock Cardiorenal syndrome EF 20-25%m Grade III diastolic dysfunction Low SvO2 P -continue levophed titrated to MAP 65 -milrinone and diuresis per CHF  team -CHF team following, eventual R/L heart cath once euvolemic  Afib on eliquis-  stable - Continue amiodartone - Maintain normal electrolytes - AC on hold in setting of GIB - Eventual TEE and cardioversion  Subacute GIB- needs addressed prior to L/R HC -Continue PPI -Potentially EGD today  Hematologic abnormalities: would not worry about in setting of COVID, acute cardiorenal syndrome.  Can recheck diff as OP.  DM2- on lower side, NPO and DC steroids -hold levemir today, SSI, resume lower dose levemir in AM  Abnormal chest radiograph- small R effusion exagerrated on film with bedside US 1/31  ICU delirium- start ramelteon, check QT, consider seroquel PRN  Best practice:  Diet: NPO Pain/Anxiety/Delirium protocol (if indicated): not indicated VAP protocol (if indicated): Head of bed at 30 degrees DVT prophylaxis: with GIB, SCD GI prophylaxis: PPI BID Glucose control: as above Mobility: Bed rest Code Status: Full Family Communication: pending Disposition: ICU  Next steps: get EGD, achieve euvolemia, get TEE, LHC/RHC after EGD  Patient critically ill due to shock, delirium Interventions to address this today pressor titration Risk of deterioration without these interventions is high  I personally spent 33 minutes providing critical care not including any separately billable procedures  Erskine Emery MD [facility] 08/28/2020 7:23 AM Prefer epic messenger for cross cover needs If after hours, please call E-link

## 2020-08-30 ENCOUNTER — Encounter (HOSPITAL_COMMUNITY): Payer: Self-pay | Admitting: Cardiology

## 2020-08-30 ENCOUNTER — Inpatient Hospital Stay (HOSPITAL_COMMUNITY): Payer: Medicare Other

## 2020-08-30 DIAGNOSIS — R570 Cardiogenic shock: Secondary | ICD-10-CM | POA: Diagnosis not present

## 2020-08-30 DIAGNOSIS — R198 Other specified symptoms and signs involving the digestive system and abdomen: Secondary | ICD-10-CM

## 2020-08-30 DIAGNOSIS — K254 Chronic or unspecified gastric ulcer with hemorrhage: Secondary | ICD-10-CM | POA: Diagnosis not present

## 2020-08-30 DIAGNOSIS — R195 Other fecal abnormalities: Secondary | ICD-10-CM | POA: Diagnosis not present

## 2020-08-30 DIAGNOSIS — D509 Iron deficiency anemia, unspecified: Secondary | ICD-10-CM | POA: Diagnosis not present

## 2020-08-30 DIAGNOSIS — K2901 Acute gastritis with bleeding: Secondary | ICD-10-CM | POA: Diagnosis not present

## 2020-08-30 DIAGNOSIS — K2210 Ulcer of esophagus without bleeding: Secondary | ICD-10-CM | POA: Diagnosis not present

## 2020-08-30 LAB — MULTIPLE MYELOMA PANEL, SERUM
Albumin SerPl Elph-Mcnc: 3.4 g/dL (ref 2.9–4.4)
Albumin/Glob SerPl: 1.1 (ref 0.7–1.7)
Alpha 1: 0.2 g/dL (ref 0.0–0.4)
Alpha2 Glob SerPl Elph-Mcnc: 0.7 g/dL (ref 0.4–1.0)
B-Globulin SerPl Elph-Mcnc: 0.9 g/dL (ref 0.7–1.3)
Gamma Glob SerPl Elph-Mcnc: 1.5 g/dL (ref 0.4–1.8)
Globulin, Total: 3.3 g/dL (ref 2.2–3.9)
IgA: 300 mg/dL (ref 61–437)
IgG (Immunoglobin G), Serum: 1422 mg/dL (ref 603–1613)
IgM (Immunoglobulin M), Srm: 97 mg/dL (ref 15–143)
Total Protein ELP: 6.7 g/dL (ref 6.0–8.5)

## 2020-08-30 LAB — CBC WITH DIFFERENTIAL/PLATELET
Abs Immature Granulocytes: 0.1 10*3/uL — ABNORMAL HIGH (ref 0.00–0.07)
Basophils Absolute: 0 10*3/uL (ref 0.0–0.1)
Basophils Relative: 0 %
Eosinophils Absolute: 0 10*3/uL (ref 0.0–0.5)
Eosinophils Relative: 0 %
HCT: 33.9 % — ABNORMAL LOW (ref 39.0–52.0)
Hemoglobin: 9 g/dL — ABNORMAL LOW (ref 13.0–17.0)
Immature Granulocytes: 1 %
Lymphocytes Relative: 13 %
Lymphs Abs: 1.4 10*3/uL (ref 0.7–4.0)
MCH: 17.6 pg — ABNORMAL LOW (ref 26.0–34.0)
MCHC: 26.5 g/dL — ABNORMAL LOW (ref 30.0–36.0)
MCV: 66.5 fL — ABNORMAL LOW (ref 80.0–100.0)
Monocytes Absolute: 1.1 10*3/uL — ABNORMAL HIGH (ref 0.1–1.0)
Monocytes Relative: 10 %
Neutro Abs: 8.5 10*3/uL — ABNORMAL HIGH (ref 1.7–7.7)
Neutrophils Relative %: 76 %
Platelets: 189 10*3/uL (ref 150–400)
RBC: 5.1 MIL/uL (ref 4.22–5.81)
RDW: 26.7 % — ABNORMAL HIGH (ref 11.5–15.5)
WBC: 11.2 10*3/uL — ABNORMAL HIGH (ref 4.0–10.5)
nRBC: 3.8 % — ABNORMAL HIGH (ref 0.0–0.2)

## 2020-08-30 LAB — URINALYSIS, ROUTINE W REFLEX MICROSCOPIC
Bilirubin Urine: NEGATIVE
Glucose, UA: NEGATIVE mg/dL
Ketones, ur: NEGATIVE mg/dL
Leukocytes,Ua: NEGATIVE
Nitrite: NEGATIVE
Protein, ur: NEGATIVE mg/dL
Specific Gravity, Urine: 1.038 — ABNORMAL HIGH (ref 1.005–1.030)
pH: 6 (ref 5.0–8.0)

## 2020-08-30 LAB — COMPREHENSIVE METABOLIC PANEL
ALT: 44 U/L (ref 0–44)
AST: 35 U/L (ref 15–41)
Albumin: 3.1 g/dL — ABNORMAL LOW (ref 3.5–5.0)
Alkaline Phosphatase: 72 U/L (ref 38–126)
Anion gap: 10 (ref 5–15)
BUN: 24 mg/dL — ABNORMAL HIGH (ref 8–23)
CO2: 44 mmol/L — ABNORMAL HIGH (ref 22–32)
Calcium: 8.4 mg/dL — ABNORMAL LOW (ref 8.9–10.3)
Chloride: 82 mmol/L — ABNORMAL LOW (ref 98–111)
Creatinine, Ser: 1.14 mg/dL (ref 0.61–1.24)
GFR, Estimated: >60 mL/min (ref >60)
Glucose, Bld: 122 mg/dL — ABNORMAL HIGH (ref 70–99)
Potassium: 3.5 mmol/L (ref 3.5–5.1)
Sodium: 136 mmol/L (ref 135–145)
Total Bilirubin: 1.8 mg/dL — ABNORMAL HIGH (ref 0.3–1.2)
Total Protein: 6.7 g/dL (ref 6.5–8.1)

## 2020-08-30 LAB — GLUCOSE, CAPILLARY
Glucose-Capillary: 76 mg/dL (ref 70–99)
Glucose-Capillary: 112 mg/dL — ABNORMAL HIGH (ref 70–99)
Glucose-Capillary: 104 mg/dL — ABNORMAL HIGH (ref 70–99)

## 2020-08-30 LAB — COOXEMETRY PANEL
Carboxyhemoglobin: 1.7 % — ABNORMAL HIGH (ref 0.5–1.5)
Methemoglobin: 0.7 % (ref 0.0–1.5)
O2 Saturation: 59.7 %
Total hemoglobin: 9 g/dL — ABNORMAL LOW (ref 12.0–16.0)

## 2020-08-30 LAB — D-DIMER, QUANTITATIVE: D-Dimer, Quant: 1.82 ug/mL-FEU — ABNORMAL HIGH (ref 0.00–0.50)

## 2020-08-30 LAB — FERRITIN: Ferritin: 212 ng/mL (ref 24–336)

## 2020-08-30 LAB — PHOSPHORUS: Phosphorus: 3 mg/dL (ref 2.5–4.6)

## 2020-08-30 LAB — MAGNESIUM: Magnesium: 2.1 mg/dL (ref 1.7–2.4)

## 2020-08-30 LAB — C-REACTIVE PROTEIN: CRP: 0.6 mg/dL (ref <1.0)

## 2020-08-30 MED ORDER — SODIUM CHLORIDE 0.9 % IV SOLN: INTRAVENOUS | Status: DC

## 2020-08-30 MED ORDER — POTASSIUM CHLORIDE CRYS ER 20 MEQ PO TBCR
40.0000 meq | EXTENDED_RELEASE_TABLET | Freq: Two times a day (BID) | ORAL | Status: AC
  Administered 2020-08-30 (×2): 40 meq via ORAL
  Filled 2020-08-30 (×2): qty 2

## 2020-08-30 MED ORDER — SUCRALFATE 1 GM/10ML PO SUSP
1.0000 g | Freq: Four times a day (QID) | ORAL | Status: DC
  Administered 2020-08-30 – 2020-09-09 (×38): 1 g via ORAL
  Filled 2020-08-30 (×38): qty 10

## 2020-08-30 MED ORDER — INSULIN DETEMIR 100 UNIT/ML ~~LOC~~ SOLN
5.0000 [IU] | Freq: Two times a day (BID) | SUBCUTANEOUS | Status: DC
  Administered 2020-08-30 (×2): 5 [IU] via SUBCUTANEOUS
  Filled 2020-08-30 (×4): qty 0.05

## 2020-08-30 MED ORDER — ASPIRIN 81 MG PO CHEW: 81.0000 mg | CHEWABLE_TABLET | ORAL | Status: DC

## 2020-08-30 MED ORDER — FUROSEMIDE 10 MG/ML IJ SOLN
80.0000 mg | Freq: Two times a day (BID) | INTRAMUSCULAR | Status: AC
  Administered 2020-08-30 (×2): 80 mg via INTRAVENOUS
  Filled 2020-08-30: qty 8

## 2020-08-30 MED ORDER — SODIUM CHLORIDE 0.9 % IV SOLN: 250.0000 mL | INTRAVENOUS | Status: DC | PRN

## 2020-08-30 MED ORDER — SODIUM CHLORIDE 0.9% FLUSH
10.0000 mL | INTRAVENOUS | Status: DC | PRN
Start: 2020-08-30 — End: 2020-09-09

## 2020-08-30 MED ORDER — SODIUM CHLORIDE 0.9% FLUSH
10.0000 mL | Freq: Two times a day (BID) | INTRAVENOUS | Status: DC
  Administered 2020-08-30 – 2020-09-01 (×4): 10 mL
  Administered 2020-09-01: 22:00:00 20 mL
  Administered 2020-09-02 – 2020-09-09 (×11): 10 mL

## 2020-08-30 MED ORDER — SODIUM CHLORIDE 0.9% FLUSH
3.0000 mL | INTRAVENOUS | Status: DC | PRN
  Administered 2020-08-30: 3 mL via INTRAVENOUS

## 2020-08-30 MED ORDER — SPIRONOLACTONE 25 MG PO TABS
25.0000 mg | ORAL_TABLET | Freq: Every day | ORAL | Status: DC
  Administered 2020-08-30 – 2020-09-03 (×5): 25 mg via ORAL
  Filled 2020-08-30 (×6): qty 1

## 2020-08-30 MED ORDER — SODIUM CHLORIDE 0.9% FLUSH
3.0000 mL | Freq: Two times a day (BID) | INTRAVENOUS | Status: DC
Start: 2020-08-30 — End: 2020-08-31
  Administered 2020-08-30 – 2020-08-31 (×3): 3 mL via INTRAVENOUS

## 2020-08-30 MED ORDER — IOHEXOL 300 MG/ML  SOLN
100.0000 mL | Freq: Once | INTRAMUSCULAR | Status: AC | PRN
  Administered 2020-08-30: 100 mL via INTRAVENOUS

## 2020-08-30 NOTE — Plan of Care (Signed)

## 2020-08-30 NOTE — Progress Notes (Addendum)
Advanced Heart Failure Rounding Note   Subjective:   08/29/20 S/P EGD - gastritis. Recommendations for carafate +IV protonix.   Diuresed with IV lasix. Negative 2 liters.    Off NE. On milrinone 0.125 mcg. CO-OX 60%   Remains in A fib on amio drip.   Denies SOB.   Objective:   Weight Range:  Vital Signs:   Temp:  [97.3 F (36.3 C)-98.4 F (36.9 C)] 98.4 F (36.9 C) (02/02 0400) Pulse Rate:  [64-121] 109 (02/02 0700) Resp:  [10-28] 17 (02/02 0700) BP: (90-123)/(55-95) 95/64 (02/02 0700) SpO2:  [83 %-100 %] 95 % (02/02 0700) Arterial Line BP: (91-135)/(51-70) 110/60 (02/02 0700) Weight:  [106.1 kg] 106.1 kg (02/02 0500) Last BM Date: 08/28/20  Weight change: Filed Weights   08/28/20 0330 08/29/20 0500 08/30/20 0500  Weight: 111.3 kg 109.8 kg 106.1 kg    Intake/Output:   Intake/Output Summary (Last 24 hours) at 08/30/2020 0741 Last data filed at 08/30/2020 0700 Gross per 24 hour  Intake 1425.42 ml  Output 3470 ml  Net -2044.58 ml    CVP 11-12  Physical Exam:  General:   No resp difficulty HEENT: normal Neck: supple. JVP 11-12 . Carotids 2+ bilat; no bruits. No lymphadenopathy or thryomegaly appreciated. Cor: PMI nondisplaced.Irregular rate & rhythm. No rubs, gallops or murmurs. Lungs: EW on 4 liters Abdomen: soft, nontender, nondistended. No hepatosplenomegaly. No bruits or masses. Good bowel sounds. Extremities: no cyanosis, clubbing, rash, edema Neuro: alert & orientedx3, cranial nerves grossly intact. moves all 4 extremities w/o difficulty. Affect pleasant    Telemetry:  Afib 100-110s    Labs: Basic Metabolic Panel: Recent Labs  Lab 08/26/20 0934 08/26/20 2323 08/27/20 0344 08/27/20 1315 08/28/20 0313 08/29/20 0329 08/30/20 0453  NA 131*   < > 132* 133* 135 135 136  K 5.5*   < > 5.4* 4.7 4.2 3.9 3.5  CL 97*   < > 96* 92* 87* 84* 82*  CO2 25   < > 25 28 33* 37* 44*  GLUCOSE 131*   < > 135* 120* 140* 152* 122*  BUN 44*   < > 43* 42* 38* 30*  24*  CREATININE 2.59*   < > 2.01* 1.90* 1.61* 1.31* 1.14  CALCIUM 8.0*   < > 8.2* 8.3* 8.5* 8.2* 8.4*  MG 2.1  --  2.0 2.0 1.7 1.9 2.1  PHOS 5.6*  --  5.0*  --  4.1 2.7 3.0   < > = values in this interval not displayed.    Liver Function Tests: Recent Labs  Lab 08/26/20 2323 08/27/20 0344 08/28/20 0313 08/29/20 0329 08/30/20 0453  AST 95* 85* 54* 40 35  ALT 84* 77* 60* 51* 44  ALKPHOS 71 67 69 64 72  BILITOT 1.0 1.2 1.4* 1.8* 1.8*  PROT 6.6 6.6 6.9 6.5 6.7  ALBUMIN 3.2* 3.2* 3.2* 3.1* 3.1*   No results for input(s): LIPASE, AMYLASE in the last 168 hours. No results for input(s): AMMONIA in the last 168 hours.  CBC: Recent Labs  Lab 08/26/20 2323 08/27/20 0344 08/27/20 1315 08/28/20 0313 08/29/20 0329 08/30/20 0453  WBC 4.0 6.4 6.9 10.3 12.2* 11.2*  NEUTROABS 3.2 5.7  --  9.4* 11.1* 8.5*  HGB 8.7* 8.6* 8.5* 8.9* 9.1* 9.0*  HCT 30.8* 28.8* 28.2* 31.3* 31.0* 33.9*  MCV 63.4* 63.0* 62.1* 63.4* 62.9* 66.5*  PLT 191 186 182 203 200 189    Cardiac Enzymes: No results for input(s): CKTOTAL, CKMB, CKMBINDEX, TROPONINI in the  last 168 hours.  BNP: BNP (last 3 results) Recent Labs    08/25/20 1452  BNP 527.0*    ProBNP (last 3 results) No results for input(s): PROBNP in the last 8760 hours.    Other results:  Imaging: No results found.   Medications:     Scheduled Medications: . albuterol  2 puff Inhalation Q6H  . vitamin C  500 mg Oral Daily  . Chlorhexidine Gluconate Cloth  6 each Topical Daily  . dextromethorphan-guaiFENesin  1 tablet Oral BID  . folic acid  1 mg Oral Daily  . insulin aspart  0-5 Units Subcutaneous QHS  . insulin aspart  0-9 Units Subcutaneous TID WC  . insulin detemir  5 Units Subcutaneous BID  . mouth rinse  15 mL Mouth Rinse BID  . pantoprazole (PROTONIX) IV  40 mg Intravenous Q12H  . potassium chloride  40 mEq Oral BID  . ramelteon  8 mg Oral QHS  . simvastatin  10 mg Oral q1800  . sodium chloride flush  10-40 mL  Intracatheter Q12H  . spironolactone  12.5 mg Oral Daily  . sucralfate  1 g Oral QID  . thiamine  100 mg Oral Daily  . zinc sulfate  220 mg Oral Daily    Infusions: . sodium chloride 250 mL (08/29/20 1900)  . amiodarone 30 mg/hr (08/30/20 0700)  . doxycycline (VIBRAMYCIN) IV Stopped (08/30/20 0230)  . milrinone 0.125 mcg/kg/min (08/30/20 0700)  . norepinephrine (LEVOPHED) Adult infusion Stopped (08/30/20 0317)    PRN Medications: acetaminophen, chlorpheniramine-HYDROcodone, guaiFENesin-dextromethorphan, ipratropium-albuterol, sodium chloride flush   Assessment/Plan:    1. 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  Off norepi . On milrinone 0.125 mcg.  - CVP 12-13. Increase lasix to 80 mg twice a day. Renal function stable.   - Increase spiro to 25 mg daily.  - Will need R/L cath tomorrow.  Acute hypoxic respiratory failure in setting of COVID PNA /AECOPD - Unvacinated CXR shows RML/RLL opacity.  Requiring 3L of oxygen via [place].  - Treatment per CCM: Remdesivr, Dexamethesone 6 IV, neb and doxycycline.   3. Persistent AF with RVR - has h/o AF at home. Unclear if this is chronic or not. Given that he is on amio at home suspect not chronic  - Continue amio drip  - off AC due to GIB - Will need TEE/DC-CV once okay from GI perspective and after L/RHC 4. GIB/Anemia -On eliquis for afib, presentation hgb 6.1, +FOBT; Ferritin 27, iron 13, folate 10.9.  S/p transfused 3 units of PRBCs -> hgb 8.7.  MCV 63. -S/P EGD 2/1 with gastritis. On protonix + carafate.  -Per GI--> not a candidate for Colonoscopy at this time. If further evaluation to ensure that a large mass or lesion is not present such that further evaluation would be required before consideration of RHC/LHC and anticoagulation needs, then would recommend a CT-AP with IV/PO  contrast when felt to be safe to administer for best attempt at further characterization (maybe this can be obtained in the next 24-36 hours to ensure as much clinical safetly prior to potential need for larger doses of anticoagulation. -Hgb 9 -Hold anticoagulation for now - continue PPI 5. AKI - Likely due to ATN/cardiorenal - Baseline Cr 1.15, peak Cr 2.59 - Stable 1.14 6. DM2 - per CCM  7. Hypovolemic Hyponatremic -  Na2+ 131 -> 133 ->135 ->135->136 -BMET in am.   Will set up RHC/LHC tomorrow.   CT abd/pelvis with contrast when ok with team.   Length of Stay: [place] NP 08/30/2020, 7:41 AM  Advanced Heart Failure Team Pager [phone] (M-F; 7a - 4p)  Please contact [facility] for night-coverage after hours (4p -7a ) and weekends on [email or web address]  Agree with above.  Remains on milrinone and IV amio. Breathing better but still some wheezing on exam. CVP back up. Results of EGD noted.   General:  Lying in bed. No resp difficulty HEENT: normal Neck: supple. JVP to jaw. Carotids 2+ bilat; no bruits. No lymphadenopathy or thryomegaly appreciated. Cor: PMI nondisplaced. Irreg tachy . No rubs, gallops or murmurs. Lungs: Mild wheeze Abdomen: obesesoft, nontender, nondistended. No hepatosplenomegaly. No bruits or masses. Good bowel sounds. Extremities: no cyanosis, clubbing, rash, tr-1+ edema Neuro: alert & orientedx3, cranial nerves grossly intact. moves all 4 extremities w/o difficulty. Affect pleasant  He remains volume overloaded. Will continue to diurese. Given results of EGD, I think we should hold off with [facility] for a few more days before challenging him. Will plan R/L cath tomorrow to evaluate the severity of his CAD and understand the totality of his cardiac condition. Hopefully can start [facility] over the weekend and then follow with TEE/DC-CV next week.   CRITICAL CARE Performed by: Glori Bickers  Total critical care time: 35 minutes  Critical care time was exclusive  of separately billable procedures and treating other patients.  Critical care was necessary to treat or prevent imminent or life-threatening deterioration.  Critical care was time spent personally by me (independent of midlevel providers or residents) on the following activities: development of treatment plan with patient and/or surrogate as well as nursing, discussions with consultants, evaluation of patient's response to treatment, examination of patient, obtaining history from patient or surrogate, ordering and performing treatments and interventions, ordering and review of laboratory studies, ordering and review of radiographic studies, pulse oximetry and re-evaluation of patient's condition.   Glori Bickers, MD  10:22 PM

## 2020-08-30 NOTE — Progress Notes (Signed)
NAME:  Perry Hunter, MRN:  [id number], DOB:  1950-09-09, LOS: 5 ADMISSION DATE:  08/25/2020, CONSULTATION DATE:  08/26/20 REFERRING MD:  Dr Manuella Ghazi, CHIEF COMPLAINT:  Chf, gib  Brief History   70 yo male with pmh dm2, GERD, tobacco abuse, copd presented to osh with worsening sob/doe and edema as well as black stool x1 month.   History of present illness   70 yo male with pmh HTN, Hyperlipidemia, T2DM, GERD, tobacco abuse, copd, paf on eliquis who presented to osh at the insistence of his family for weeks of increasing abdominal girth/le edema, sob/doe and black tarry stools for 1 month.   Upon presentation pt was found to have hgb of 6, tachycardic, infiltrate on cxr (noted to be covid + as well). He was grossly volume up with hypotension and hemoccult positive.   Pt was started on neo and uptitrated to >172mg, subsequently transitioned to dopamine after conversation with cardiology by Dr SManuella Ghazi Echo was done showing LVEF 20-25% with grade 3 diastolic dysfunction and severe RH dysfunction with RVSP 44.436mg  GI was consulted for his +hemoccult and recommended endoscopy when pt stabilized. At this time he has received 3u prbc.   Pt arrived to our ICU was alert and oriented x2.  On 5 mcg of dopamine.  Satting in 100s on 4 L.  Denied any overt shortness of breath.  Denied any GI symptoms did endorse melena previously consent was obtained from patient and wife for central line and A-line.   Past Medical History   Past Medical History:  Diagnosis Date  . Arthritis   . Asthma   . CHF (congestive heart failure) ([place]  . Diabetes mellitus without complication ([place]  . GERD (gastroesophageal reflux disease)   . Hypercholesteremia   . Hypertension    Also with history of A. fib.  [facility]   [facility] [place]   Procedures:  Central line A-line  Significant Diagnostic Tests:  1/29 echo: LVEF 20-25% grade  III diastolic dysfunction, rh severely dysfunctional.  1/30 abdominal Us>>  Micro Data:  1/28 sars2: positive  Antimicrobials:  N/A  Interim history/subjective:  Looks/feels better this AM. Wheezing again but he states this is baseline. Diuresing well.  Objective   Blood pressure 90/71, pulse (!) 103, temperature 98.2 F (36.8 C), temperature source Oral, resp. rate 14, height '5\' 8"'$  (1.727 m), weight 106.1 kg, SpO2 95 %. CVP:  [7 mmHg-13 mmHg] 13 mmHg      Intake/Output Summary (Last 24 hours) at 08/30/2020 [id number] data filed at 08/30/2020 0600 Gross per 24 hour  Intake 882.58 ml  Output 3470 ml  Net -2587.42 ml   Filed Weights   08/28/20 0330 08/29/20 0500 08/30/20 0500  Weight: 111.3 kg 109.8 kg 106.1 kg    Examination: Constitutional: no acute distress laying in bed  Eyes: EOMI, pupils equal Ears, nose, mouth, and throat: MMM, trachea midline Cardiovascular: Slightly tachy, ext warm Respiratory: wheezing at apices, almost seems to arise from vocal cords Gastrointestinal: Soft, +BS Skin: No rashes, normal turgor Neurologic: moves all 4 ext to command Psychiatric: more oriented this am  SvO2 59% Weaned off levophed Cr continues to improve  Resolved Hospital Problem list   N/A  Assessment & Plan:   Acute hypoxic respiratory failure due to COVID-19 infection- incidental, pulmonary edema primary driver here, would do the 3 day ppx dose; usual airborne isolation period.  COPD- thought  was in flare but probably just fluid and now near baseline resp status Tobacco use disorder  P -Supplemental O2 as needed  -IS -Duonebs, albuterol   -IV doxycycline is fine (holding on oral due to concern for worsening esophagitis)  Acute HFrEF Cardiogenic Shock Cardiorenal syndrome EF 20-25%m Grade III diastolic dysfunction Low SvO2 P -milrinone and diuresis per CHF team -CHF team following, eventual R/L heart cath once euvolemic and can assure he will tolerate  AC/DAPT  Afib on eliquis-  stable - Continue amiodartone - Maintain normal electrolytes - Eventual TEE and cardioversion when we can be sure he can tolerate AC  Subacute GIB- looks to be due to severe esophagitis, do not think colonoscopy needs to be pursued unless CHF feels otherwise - Continue PPI - Will discuss AC/DAPT challenge with CHF team  Hematologic abnormalities: would not worry about in setting of COVID, acute cardiorenal syndrome.  Can recheck diff as OP.  DM2- on lower side, PO status variable -levemir 5 units BID, starting back diet, watch closely and titrate up PRN  Abnormal chest radiograph- small R effusion exagerrated on film with bedside US 1/31  ICU delirium- improved off steroids, continue ramelteon, encourage day/night cycles  Best practice:  Diet: advance as tolerated Pain/Anxiety/Delirium protocol (if indicated): not indicated VAP protocol (if indicated): Head of bed at 30 degrees DVT prophylaxis: with GIB, SCD GI prophylaxis: PPI BID Glucose control: as above Mobility: Bed rest Code Status: Full Family Communication: pending Disposition: ICU  Next steps: achieve euvolemia, figure out AC/DAPT challenge, get TEE, LHC/RHC after EGD  Patient critically ill due to shock, delirium Interventions to address this today pressor titration Risk of deterioration without these interventions is high  I personally spent 31 minutes providing critical care not including any separately billable procedures  Erskine Emery MD [facility] 08/28/2020 7:23 AM Prefer epic messenger for cross cover needs If after hours, please call E-link

## 2020-08-30 NOTE — Progress Notes (Signed)
Daily Rounding Note  08/30/2020, 10:51 AM  LOS: 5 days   SUBJECTIVE:   Chief complaint:  Gib, anemia    No problems with swallowing.  On clear liquids.  No complaint of abdominal pain.  No nausea. Cardiac cath planned for tomorrow.  OBJECTIVE:         Vital signs in last 24 hours:    Temp:  [97.3 F (36.3 C)-98.4 F (36.9 C)] 98.4 F (36.9 C) (02/02 0400) Pulse Rate:  [64-125] 110 (02/02 1030) Resp:  [10-28] 17 (02/02 1030) BP: (90-123)/(55-102) 112/102 (02/02 0900) SpO2:  [83 %-100 %] 95 % (02/02 1030) Arterial Line BP: (76-131)/(48-69) 100/54 (02/02 1030) Weight:  [106.1 kg] 106.1 kg (02/02 0500) Last BM Date: 08/28/20 Filed Weights   08/28/20 0330 08/29/20 0500 08/30/20 0500  Weight: 111.3 kg 109.8 kg 106.1 kg   Patient was not reexamined.  Intake/Output from previous day: 02/01 0701 - 02/02 0700 In: 1425.4 [I.V.:870.4; IV Piggyback:555.1] Out: 5056 [Urine:3470]  Intake/Output this shift: Total I/O In: 312.5 [P.O.:240; I.V.:72.5] Out: 355 [Urine:355]  Lab Results: Recent Labs    08/28/20 0313 08/29/20 0329 08/30/20 0453  WBC 10.3 12.2* 11.2*  HGB 8.9* 9.1* 9.0*  HCT 31.3* 31.0* 33.9*  PLT 203 200 189   BMET Recent Labs    08/28/20 0313 08/29/20 0329 08/30/20 0453  NA 135 135 136  K 4.2 3.9 3.5  CL 87* 84* 82*  CO2 33* 37* 44*  GLUCOSE 140* 152* 122*  BUN 38* 30* 24*  CREATININE 1.61* 1.31* 1.14  CALCIUM 8.5* 8.2* 8.4*   LFT Recent Labs    08/28/20 0313 08/29/20 0329 08/30/20 0453  PROT 6.9 6.5 6.7  ALBUMIN 3.2* 3.1* 3.1*  AST 54* 40 35  ALT 60* 51* 44  ALKPHOS 69 64 72  BILITOT 1.4* 1.8* 1.8*   PT/INR No results for input(s): LABPROT, INR in the last 72 hours. Hepatitis Panel Recent Labs    08/27/20 1527  HEPBSAG NON REACTIVE  HCVAB NON REACTIVE  HEPAIGM NON REACTIVE  HEPBIGM NON REACTIVE    Studies/Results: No results found.   Scheduled Meds: . albuterol  2  puff Inhalation Q6H  . vitamin C  500 mg Oral Daily  . Chlorhexidine Gluconate Cloth  6 each Topical Daily  . dextromethorphan-guaiFENesin  1 tablet Oral BID  . folic acid  1 mg Oral Daily  . furosemide  80 mg Intravenous BID  . insulin aspart  0-5 Units Subcutaneous QHS  . insulin aspart  0-9 Units Subcutaneous TID WC  . insulin detemir  5 Units Subcutaneous BID  . mouth rinse  15 mL Mouth Rinse BID  . pantoprazole (PROTONIX) IV  40 mg Intravenous Q12H  . potassium chloride  40 mEq Oral BID  . ramelteon  8 mg Oral QHS  . simvastatin  10 mg Oral q1800  . sodium chloride flush  10-40 mL Intracatheter Q12H  . sodium chloride flush  3 mL Intravenous Q12H  . spironolactone  25 mg Oral Daily  . sucralfate  1 g Oral QID  . thiamine  100 mg Oral Daily  . zinc sulfate  220 mg Oral Daily   Continuous Infusions: . sodium chloride 250 mL (08/29/20 1900)  . amiodarone 30 mg/hr (08/30/20 1000)  . doxycycline (VIBRAMYCIN) IV 100 mg (08/30/20 1008)  . milrinone 0.125 mcg/kg/min (08/30/20 1000)  . norepinephrine (LEVOPHED) Adult infusion Stopped (08/30/20 0317)   PRN Meds:.acetaminophen, chlorpheniramine-HYDROcodone, guaiFENesin-dextromethorphan, ipratropium-albuterol, sodium  chloride flush   ASSESMENT:   *    IDA, anemia, melena. 08/29/2020 EGD with severe, grade D, erosive esophagitis with bleeding.  Irregular Z-line.  2 cm HH.  Mild erosive gastritis. Pending biopsies of esophagus and stomach. Carafate 1g qid, Protonix 40 mg IV bid ongoing. 2 PRBCs in [place] Hgb stable.     *    COVID-19 pneumonia.  *    Biventricular heart failure.  Cardiac cath planned for tomorrow morning 2/3.  *    Chronic A. fib.  Eliquis at home.  *    AKI.  Resolved.  *   Elevated transaminases, minor elevation T bili, normal alk phos.  Improving Suspect this is from right heart failure.  No evidence of cirrhosis on abdominal ultrasound.     PLAN   *   Patient is not a candidate for colonoscopy.   Ordering a CTAP with contrast to rule out any mass lesions in the colon.  *   Following completion of the CT scan he could have his diet advanced to full liquids and then soft diet as tolerated.    Azucena Freed  08/30/2020, 10:51 AM Phone [phone]

## 2020-08-31 ENCOUNTER — Encounter (HOSPITAL_COMMUNITY): Payer: Self-pay | Admitting: Gastroenterology

## 2020-08-31 ENCOUNTER — Inpatient Hospital Stay (HOSPITAL_COMMUNITY): Payer: Medicare Other

## 2020-08-31 ENCOUNTER — Encounter (HOSPITAL_COMMUNITY)
Admission: EM | Disposition: A | Discharge status: Home Health | Payer: Self-pay | Source: Home / Self Care | Attending: Internal Medicine

## 2020-08-31 ENCOUNTER — Other Ambulatory Visit: Payer: Self-pay | Admitting: Physician Assistant

## 2020-08-31 DIAGNOSIS — K2951 Unspecified chronic gastritis with bleeding: Secondary | ICD-10-CM

## 2020-08-31 DIAGNOSIS — I2510 Atherosclerotic heart disease of native coronary artery without angina pectoris: Secondary | ICD-10-CM

## 2020-08-31 DIAGNOSIS — I4819 Other persistent atrial fibrillation: Secondary | ICD-10-CM

## 2020-08-31 DIAGNOSIS — R570 Cardiogenic shock: Secondary | ICD-10-CM | POA: Diagnosis not present

## 2020-08-31 HISTORY — PX: RIGHT/LEFT HEART CATH AND CORONARY ANGIOGRAPHY: CATH118266

## 2020-08-31 LAB — POCT I-STAT EG7
Acid-Base Excess: 16 mmol/L — ABNORMAL HIGH (ref 0.0–2.0)
Acid-Base Excess: 20 mmol/L — ABNORMAL HIGH (ref 0.0–2.0)
Bicarbonate: 44.3 mmol/L — ABNORMAL HIGH (ref 20.0–28.0)
Bicarbonate: 48.4 mmol/L — ABNORMAL HIGH (ref 20.0–28.0)
Calcium, Ion: 0.93 mmol/L — ABNORMAL LOW (ref 1.15–1.40)
Calcium, Ion: 1.01 mmol/L — ABNORMAL LOW (ref 1.15–1.40)
HCT: 36 % — ABNORMAL LOW (ref 39.0–52.0)
HCT: 38 % — ABNORMAL LOW (ref 39.0–52.0)
Hemoglobin: 12.2 g/dL — ABNORMAL LOW (ref 13.0–17.0)
Hemoglobin: 12.9 g/dL — ABNORMAL LOW (ref 13.0–17.0)
O2 Saturation: 52 %
O2 Saturation: 53 %
Potassium: 4.1 mmol/L (ref 3.5–5.1)
Potassium: 4.3 mmol/L (ref 3.5–5.1)
Sodium: 132 mmol/L — ABNORMAL LOW (ref 135–145)
Sodium: 134 mmol/L — ABNORMAL LOW (ref 135–145)
TCO2: 46 mmol/L — ABNORMAL HIGH (ref 22–32)
TCO2: >50 mmol/L — ABNORMAL HIGH (ref 22–32)
pCO2, Ven: 69.7 mmHg — ABNORMAL HIGH (ref 44.0–60.0)
pCO2, Ven: 73.9 mmHg (ref 44.0–60.0)
pH, Ven: 7.411 (ref 7.250–7.430)
pH, Ven: 7.424 (ref 7.250–7.430)
pO2, Ven: 29 mmHg — CL (ref 32.0–45.0)
pO2, Ven: 29 mmHg — CL (ref 32.0–45.0)

## 2020-08-31 LAB — CBC
HCT: 33.3 % — ABNORMAL LOW (ref 39.0–52.0)
HCT: 33.7 % — ABNORMAL LOW (ref 39.0–52.0)
Hemoglobin: 8.9 g/dL — ABNORMAL LOW (ref 13.0–17.0)
Hemoglobin: 9.1 g/dL — ABNORMAL LOW (ref 13.0–17.0)
MCH: 17.9 pg — ABNORMAL LOW (ref 26.0–34.0)
MCH: 17.9 pg — ABNORMAL LOW (ref 26.0–34.0)
MCHC: 26.7 g/dL — ABNORMAL LOW (ref 30.0–36.0)
MCHC: 27 g/dL — ABNORMAL LOW (ref 30.0–36.0)
MCV: 67.1 fL — ABNORMAL LOW (ref 80.0–100.0)
MCV: 66.3 fL — ABNORMAL LOW (ref 80.0–100.0)
Platelets: 207 10*3/uL (ref 150–400)
Platelets: 208 10*3/uL (ref 150–400)
RBC: 4.96 MIL/uL (ref 4.22–5.81)
RBC: 5.08 MIL/uL (ref 4.22–5.81)
RDW: 27.5 % — ABNORMAL HIGH (ref 11.5–15.5)
RDW: 27.9 % — ABNORMAL HIGH (ref 11.5–15.5)
WBC: 9.5 10*3/uL (ref 4.0–10.5)
WBC: 9.9 10*3/uL (ref 4.0–10.5)
nRBC: 3 % — ABNORMAL HIGH (ref 0.0–0.2)
nRBC: 2.4 % — ABNORMAL HIGH (ref 0.0–0.2)

## 2020-08-31 LAB — POCT I-STAT 7, (LYTES, BLD GAS, ICA,H+H)
Acid-Base Excess: 12 mmol/L — ABNORMAL HIGH (ref 0.0–2.0)
Bicarbonate: 37.9 mmol/L — ABNORMAL HIGH (ref 20.0–28.0)
Calcium, Ion: 0.73 mmol/L — CL (ref 1.15–1.40)
HCT: 31 % — ABNORMAL LOW (ref 39.0–52.0)
Hemoglobin: 10.5 g/dL — ABNORMAL LOW (ref 13.0–17.0)
O2 Saturation: 93 %
Potassium: 3.4 mmol/L — ABNORMAL LOW (ref 3.5–5.1)
Sodium: 141 mmol/L (ref 135–145)
TCO2: 40 mmol/L — ABNORMAL HIGH (ref 22–32)
pCO2 arterial: 57.9 mmHg — ABNORMAL HIGH (ref 32.0–48.0)
pH, Arterial: 7.424 (ref 7.350–7.450)
pO2, Arterial: 67 mmHg — ABNORMAL LOW (ref 83.0–108.0)

## 2020-08-31 LAB — BASIC METABOLIC PANEL
Anion gap: 10 (ref 5–15)
BUN: 22 mg/dL (ref 8–23)
CO2: 43 mmol/L — ABNORMAL HIGH (ref 22–32)
Calcium: 8 mg/dL — ABNORMAL LOW (ref 8.9–10.3)
Chloride: 80 mmol/L — ABNORMAL LOW (ref 98–111)
Creatinine, Ser: 1.01 mg/dL (ref 0.61–1.24)
GFR, Estimated: >60 mL/min (ref >60)
Glucose, Bld: 106 mg/dL — ABNORMAL HIGH (ref 70–99)
Potassium: 3.4 mmol/L — ABNORMAL LOW (ref 3.5–5.1)
Sodium: 133 mmol/L — ABNORMAL LOW (ref 135–145)

## 2020-08-31 LAB — MAGNESIUM: Magnesium: 1.7 mg/dL (ref 1.7–2.4)

## 2020-08-31 LAB — URINE CULTURE: Culture: NO GROWTH

## 2020-08-31 LAB — CREATININE, SERUM
Creatinine, Ser: 1.04 mg/dL (ref 0.61–1.24)
GFR, Estimated: >60 mL/min (ref >60)

## 2020-08-31 LAB — GLUCOSE, CAPILLARY: Glucose-Capillary: 123 mg/dL — ABNORMAL HIGH (ref 70–99)

## 2020-08-31 LAB — COOXEMETRY PANEL
Carboxyhemoglobin: 1.7 % — ABNORMAL HIGH (ref 0.5–1.5)
Methemoglobin: 0.8 % (ref 0.0–1.5)
O2 Saturation: 65.1 %
Total hemoglobin: 9.1 g/dL — ABNORMAL LOW (ref 12.0–16.0)

## 2020-08-31 SURGERY — RIGHT/LEFT HEART CATH AND CORONARY ANGIOGRAPHY
Anesthesia: LOCAL

## 2020-08-31 MED ORDER — VERAPAMIL HCL 2.5 MG/ML IV SOLN
INTRAVENOUS | Status: DC | PRN
  Administered 2020-08-31: 10 mL via INTRA_ARTERIAL

## 2020-08-31 MED ORDER — POTASSIUM CHLORIDE CRYS ER 20 MEQ PO TBCR
40.0000 meq | EXTENDED_RELEASE_TABLET | Freq: Once | ORAL | Status: AC
  Administered 2020-08-31: 40 meq via ORAL
  Filled 2020-08-31: qty 2

## 2020-08-31 MED ORDER — AMIODARONE LOAD VIA INFUSION
150.0000 mg | Freq: Once | INTRAVENOUS | Status: AC
  Administered 2020-08-31: 150 mg via INTRAVENOUS
  Filled 2020-08-31: qty 83.34

## 2020-08-31 MED ORDER — IOHEXOL 350 MG/ML SOLN
INTRAVENOUS | Status: DC | PRN
  Administered 2020-08-31: 77 mL via INTRA_ARTERIAL

## 2020-08-31 MED ORDER — HEPARIN (PORCINE) IN NACL 1000-0.9 UT/500ML-% IV SOLN
INTRAVENOUS | Status: DC | PRN
  Administered 2020-08-31 (×2): 500 mL

## 2020-08-31 MED ORDER — UMECLIDINIUM BROMIDE 62.5 MCG/INH IN AEPB
1.0000 | INHALATION_SPRAY | Freq: Every day | RESPIRATORY_TRACT | Status: DC
  Administered 2020-09-01 – 2020-09-09 (×8): 1 via RESPIRATORY_TRACT
  Filled 2020-08-31 (×2): qty 7

## 2020-08-31 MED ORDER — ENOXAPARIN SODIUM 40 MG/0.4ML ~~LOC~~ SOLN: 40.0000 mg | SUBCUTANEOUS | Status: DC

## 2020-08-31 MED ORDER — ACETAMINOPHEN 325 MG PO TABS: 650.0000 mg | ORAL_TABLET | ORAL | Status: DC | PRN

## 2020-08-31 MED ORDER — LIDOCAINE HCL (PF) 1 % IJ SOLN
INTRAMUSCULAR | Status: DC | PRN
  Administered 2020-08-31 (×2): 2 mL

## 2020-08-31 MED ORDER — FUROSEMIDE 10 MG/ML IJ SOLN
80.0000 mg | Freq: Two times a day (BID) | INTRAMUSCULAR | Status: DC
  Administered 2020-08-31 – 2020-09-01 (×4): 80 mg via INTRAVENOUS
  Filled 2020-08-31 (×4): qty 8

## 2020-08-31 MED ORDER — SODIUM CHLORIDE 0.9% FLUSH: 3.0000 mL | INTRAVENOUS | Status: DC | PRN

## 2020-08-31 MED ORDER — SODIUM CHLORIDE 0.9% FLUSH
3.0000 mL | Freq: Two times a day (BID) | INTRAVENOUS | Status: DC
  Administered 2020-09-01 – 2020-09-06 (×9): 3 mL via INTRAVENOUS

## 2020-08-31 MED ORDER — MAGNESIUM SULFATE 2 GM/50ML IV SOLN
2.0000 g | Freq: Once | INTRAVENOUS | Status: AC
  Administered 2020-08-31: 2 g via INTRAVENOUS
  Filled 2020-08-31: qty 50

## 2020-08-31 MED ORDER — BUDESONIDE 0.25 MG/2ML IN SUSP: 0.2500 mg | Freq: Two times a day (BID) | RESPIRATORY_TRACT | Status: DC

## 2020-08-31 MED ORDER — REVEFENACIN 175 MCG/3ML IN SOLN: 175.0000 ug | Freq: Every day | RESPIRATORY_TRACT | Status: DC

## 2020-08-31 MED ORDER — HYDRALAZINE HCL 20 MG/ML IJ SOLN: 10.0000 mg | INTRAMUSCULAR | Status: AC | PRN

## 2020-08-31 MED ORDER — FLUTICASONE FUROATE-VILANTEROL 200-25 MCG/INH IN AEPB
1.0000 | INHALATION_SPRAY | Freq: Every day | RESPIRATORY_TRACT | Status: DC
  Administered 2020-09-01 – 2020-09-09 (×8): 1 via RESPIRATORY_TRACT
  Filled 2020-08-31: qty 28

## 2020-08-31 MED ORDER — POTASSIUM CHLORIDE CRYS ER 20 MEQ PO TBCR
40.0000 meq | EXTENDED_RELEASE_TABLET | Freq: Two times a day (BID) | ORAL | Status: DC
  Administered 2020-08-31 – 2020-09-05 (×10): 40 meq via ORAL
  Filled 2020-08-31 (×11): qty 2

## 2020-08-31 MED ORDER — SODIUM CHLORIDE 0.9 % IV SOLN: 250.0000 mL | INTRAVENOUS | Status: DC | PRN

## 2020-08-31 MED ORDER — ONDANSETRON HCL 4 MG/2ML IJ SOLN
4.0000 mg | Freq: Four times a day (QID) | INTRAMUSCULAR | Status: DC | PRN
  Administered 2020-09-09: 4 mg via INTRAVENOUS
  Filled 2020-08-31: qty 2

## 2020-08-31 MED ORDER — HEPARIN SODIUM (PORCINE) 1000 UNIT/ML IJ SOLN
INTRAMUSCULAR | Status: DC | PRN
  Administered 2020-08-31: 5000 [IU] via INTRAVENOUS

## 2020-08-31 MED ORDER — LABETALOL HCL 5 MG/ML IV SOLN: 10.0000 mg | INTRAVENOUS | Status: AC | PRN

## 2020-08-31 MED ORDER — ARFORMOTEROL TARTRATE 15 MCG/2ML IN NEBU: 15.0000 ug | INHALATION_SOLUTION | Freq: Two times a day (BID) | RESPIRATORY_TRACT | Status: DC

## 2020-08-31 SURGICAL SUPPLY — 16 items
CATH 5FR JL3.5 JR4 ANG PIG MP (CATHETERS) ×1 IMPLANT
CATH BALLN WEDGE 5F 110CM (CATHETERS) ×1 IMPLANT
CATH INFINITI 5 FR 3DRC (CATHETERS) ×1 IMPLANT
CATH VISTA GUIDE 6FR XB3.5 (CATHETERS) ×1 IMPLANT
DEVICE RAD TR BAND REGULAR (VASCULAR PRODUCTS) ×1 IMPLANT
ELECT DEFIB PAD ADLT CADENCE (PAD) ×1 IMPLANT
GLIDESHEATH SLEND SS 6F .021 (SHEATH) ×1 IMPLANT
GUIDEWIRE INQWIRE 1.5J.035X260 (WIRE) IMPLANT
INQWIRE 1.5J .035X260CM (WIRE) ×4
KIT HEART LEFT (KITS) ×1 IMPLANT
KIT MICROPUNCTURE NIT STIFF (SHEATH) ×1 IMPLANT
PACK CARDIAC CATHETERIZATION (CUSTOM PROCEDURE TRAY) ×2 IMPLANT
SHEATH GLIDE SLENDER 4/5FR (SHEATH) ×1 IMPLANT
SHEATH PROBE COVER 6X72 (BAG) ×1 IMPLANT
TRANSDUCER W/STOPCOCK (MISCELLANEOUS) ×2 IMPLANT
WIRE HI TORQ VERSACORE-J 145CM (WIRE) ×1 IMPLANT

## 2020-08-31 NOTE — Progress Notes (Addendum)
Advanced Heart Failure Rounding Note   Subjective:   08/29/20 S/P EGD - gastritis. Recommendations for carafate +IV protonix.    Yesterday norepi stopped. On milrinone 0.125 mcg. CO-OX 65%   Complaining of cough. Asking when he is getting out of here.     Objective:   Weight Range:  Vital Signs:   Temp:  [98.1 F (36.7 C)-98.8 F (37.1 C)] 98.2 F (36.8 C) (02/03 0000) Pulse Rate:  [33-125] 99 (02/03 0800) Resp:  [13-22] 15 (02/03 0800) BP: (86-117)/(59-81) 98/62 (02/03 0800) SpO2:  [90 %-100 %] 91 % (02/03 0800) Arterial Line BP: (88-128)/(41-69) 104/52 (02/03 0800) Weight:  [105.8 kg] 105.8 kg (02/02 1220) Last BM Date: 08/30/20  Weight change: Filed Weights   08/29/20 0500 08/30/20 0500 08/30/20 1220  Weight: 109.8 kg 106.1 kg 105.8 kg    Intake/Output:   Intake/Output Summary (Last 24 hours) at 08/31/2020 0947 Last data filed at 08/31/2020 0900 Gross per 24 hour  Intake 1127.75 ml  Output 3405 ml  Net -2277.25 ml    CVP 12 Physical Exam:  General: appears chronically ill.   No resp difficulty HEENT: normal Neck: supple. JVP 11-12 . Carotids 2+ bilat; no bruits. No lymphadenopathy or thryomegaly appreciated. Cor: PMI nondisplaced. Irregular rate & rhythm. No rubs, gallops or murmurs. Left subclavian  Lungs: EW throughout 8 liters Abdomen: soft, nontender, nondistended. No hepatosplenomegaly. No bruits or masses. Good bowel sounds. Extremities: no cyanosis, clubbing, rash, edema Neuro: alert & orientedx3, cranial nerves grossly intact. moves all 4 extremities w/o difficulty. Affect pleasant   Telemetry:  A fib 110-120s    Labs: Basic Metabolic Panel: Recent Labs  Lab 08/26/20 0934 08/26/20 2323 08/27/20 0344 08/27/20 1315 08/28/20 0313 08/29/20 0329 08/30/20 0453 08/31/20 0531  NA 131*   < > 132* 133* 135 135 136 133*  K 5.5*   < > 5.4* 4.7 4.2 3.9 3.5 3.4*  CL 97*   < > 96* 92* 87* 84* 82* 80*  CO2 25   < > 25 28 33* 37* 44* 43*  GLUCOSE  131*   < > 135* 120* 140* 152* 122* 106*  BUN 44*   < > 43* 42* 38* 30* 24* 22  CREATININE 2.59*   < > 2.01* 1.90* 1.61* 1.31* 1.14 1.01  CALCIUM 8.0*   < > 8.2* 8.3* 8.5* 8.2* 8.4* 8.0*  MG 2.1  --  2.0 2.0 1.7 1.9 2.1 1.7  PHOS 5.6*  --  5.0*  --  4.1 2.7 3.0  --    < > = values in this interval not displayed.    Liver Function Tests: Recent Labs  Lab 08/26/20 2323 08/27/20 0344 08/28/20 0313 08/29/20 0329 08/30/20 0453  AST 95* 85* 54* 40 35  ALT 84* 77* 60* 51* 44  ALKPHOS 71 67 69 64 72  BILITOT 1.0 1.2 1.4* 1.8* 1.8*  PROT 6.6 6.6 6.9 6.5 6.7  ALBUMIN 3.2* 3.2* 3.2* 3.1* 3.1*   No results for input(s): LIPASE, AMYLASE in the last 168 hours. No results for input(s): AMMONIA in the last 168 hours.  CBC: Recent Labs  Lab 08/26/20 2323 08/27/20 0344 08/27/20 1315 08/28/20 0313 08/29/20 0329 08/30/20 0453 08/31/20 0531  WBC 4.0 6.4 6.9 10.3 12.2* 11.2* 9.5  NEUTROABS 3.2 5.7  --  9.4* 11.1* 8.5*  --   HGB 8.7* 8.6* 8.5* 8.9* 9.1* 9.0* 8.9*  HCT 30.8* 28.8* 28.2* 31.3* 31.0* 33.9* 33.3*  MCV 63.4* 63.0* 62.1* 63.4* 62.9* 66.5* 67.1*  PLT 191 186 182 203 200 189 207    Cardiac Enzymes: No results for input(s): CKTOTAL, CKMB, CKMBINDEX, TROPONINI in the last 168 hours.  BNP: BNP (last 3 results) Recent Labs    08/25/20 1452  BNP 527.0*    ProBNP (last 3 results) No results for input(s): PROBNP in the last 8760 hours.    Other results:  Imaging: DG Chest 1 View  Result Date: 08/31/2020 CLINICAL DATA:  Dyspnea, pulmonary edema EXAM: CHEST  1 VIEW COMPARISON:  08/27/2020 FINDINGS: Left subclavian central venous catheter tip is again seen within the superior vena cava. Pulmonary insufflation is normal and symmetric. Perihilar pulmonary edema has improved in the interval since prior examination, now mild in severity. No pneumothorax or pleural effusion. Mild cardiomegaly is stable. No acute bone abnormality. IMPRESSION: Stable cardiomegaly. Improving  pulmonary edema. Resolved right pleural effusion. Electronically Signed   By: Fidela Salisbury MD   On: 08/31/2020 08:20   CT ABDOMEN PELVIS W CONTRAST  Result Date: 08/30/2020 CLINICAL DATA:  Anemia, patient is not a colonoscopy candidate, assess for intestinal malignancy or other abnormality. EXAM: CT ABDOMEN AND PELVIS WITH CONTRAST TECHNIQUE: Multidetector CT imaging of the abdomen and pelvis was performed using the standard protocol following bolus administration of intravenous contrast. CONTRAST:  132m OMNIPAQUE IOHEXOL 300 MG/ML  SOLN COMPARISON:  None. FINDINGS: Lower chest: Moderate right, small left pleural effusions and associated atelectasis or consolidation. Cardiomegaly. Coronary artery calcifications. Hepatobiliary: No solid liver abnormality is seen. No gallstones, gallbladder wall thickening, or biliary dilatation. Pancreas: Unremarkable. No pancreatic ductal dilatation or surrounding inflammatory changes. Spleen: Normal in size without significant abnormality. Adrenals/Urinary Tract: Adrenal glands are unremarkable. Kidneys are normal, without renal calculi, solid lesion, or hydronephrosis. Foley catheter within the urinary bladder. Stomach/Bowel: Stomach is within normal limits. Appendix appears normal. No evidence of bowel wall thickening, distention, or inflammatory changes. Occasional sigmoid diverticula Vascular/Lymphatic: Aortic atherosclerosis. No enlarged abdominal or pelvic lymph nodes. Reproductive: Prostatomegaly. Other: No abdominal wall hernia or abnormality. No abdominopelvic ascites. Musculoskeletal: No acute or significant osseous findings. IMPRESSION: 1. No CT findings of the abdomen or pelvis to explain anemia. No mass or other abnormality of the bowel identified. 2. Occasional sigmoid diverticula without evidence of acute diverticulitis. 3. Prostatomegaly. 4. Moderate right, small left pleural effusions and associated atelectasis or consolidation. 5. Coronary artery disease.  Aortic Atherosclerosis (ICD10-I70.0). Electronically Signed   By: AEddie CandleM.D.   On: 08/30/2020 14:24     Medications:     Scheduled Medications: . albuterol  2 puff Inhalation Q6H  . vitamin C  500 mg Oral Daily  . Chlorhexidine Gluconate Cloth  6 each Topical Daily  . dextromethorphan-guaiFENesin  1 tablet Oral BID  . fluticasone furoate-vilanterol  1 puff Inhalation Daily  . folic acid  1 mg Oral Daily  . insulin aspart  0-5 Units Subcutaneous QHS  . insulin aspart  0-9 Units Subcutaneous TID WC  . mouth rinse  15 mL Mouth Rinse BID  . pantoprazole (PROTONIX) IV  40 mg Intravenous Q12H  . ramelteon  8 mg Oral QHS  . simvastatin  10 mg Oral q1800  . sodium chloride flush  10-40 mL Intracatheter Q12H  . sodium chloride flush  3 mL Intravenous Q12H  . spironolactone  25 mg Oral Daily  . sucralfate  1 g Oral QID  . thiamine  100 mg Oral Daily  . umeclidinium bromide  1 puff Inhalation Daily  . zinc sulfate  220 mg Oral Daily  Infusions: . sodium chloride 250 mL (08/29/20 1900)  . sodium chloride    . sodium chloride 10 mL/hr at 08/31/20 0458  . amiodarone 30 mg/hr (08/31/20 0900)  . doxycycline (VIBRAMYCIN) IV 125 mL/hr at 08/31/20 0900  . magnesium sulfate bolus IVPB 2 g (08/31/20 0915)  . milrinone 0.125 mcg/kg/min (08/31/20 0900)  . norepinephrine (LEVOPHED) Adult infusion Stopped (08/30/20 1119)    PRN Medications: sodium chloride, acetaminophen, chlorpheniramine-HYDROcodone, guaiFENesin-dextromethorphan, sodium chloride flush, sodium chloride flush   Assessment/Plan:    1. 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  - CVP 12-13. Increase lasix to 80 mg twice a day. Renal function stable.   -Continue spiro to 25 mg daily.  - Will need R/L cath today.  Acute hypoxic respiratory  failure in setting of COVID PNA /AECOPD - Unvacinated CXR shows RML/RLL opacity.  Requiring 3L of oxygen via [place].  - Treatment per CCM: Remdesivr, Dexamethesone 6 IV, neb and doxycycline.   3. Persistent AF with RVR - has h/o AF at home. Unclear if this is chronic or not. Given that he is on amio at home suspect not chronic  - Give 150  Bolus and increase amio to 60 mg per hour. Place SCDs  - off AC due to GIB - Will need TEE/DC-CV once okay from GI perspective and after L/RHC 4. GIB/Anemia -On eliquis for afib, presentation hgb 6.1, +FOBT; Ferritin 27, iron 13, folate 10.9.  S/p transfused 3 units of PRBCs -> hgb 8.7.  MCV 63. -S/P EGD 2/1 with gastritis. On protonix + carafate.  -Per GI--> not a candidate for Colonoscopy at this time. If further evaluation to ensure that a large mass or lesion is not present such that further evaluation would be required before consideration of RHC/LHC and anticoagulation needs, then would recommend a CT-AP with IV/PO contrast when felt to be safe to administer for best attempt at further characterization (maybe this can be obtained in the next 24-36 hours to ensure as much clinical safetly prior to potential need for larger doses of anticoagulation. -Hgb 9.5  -Hold anticoagulation for now - continue PPI 5. AKI - Likely due to ATN/cardiorenal - Baseline Cr 1.15, peak Cr 2.59 - Stable 1 6. DM2 - per CCM  7. Hypovolemic Hyponatremic -Na2+ 131 -> 133 ->135 ->135->136-> 133  -BMET in am.   Will set up RHC/LHC today.    Length of Stay: [place] NP 08/31/2020, 9:47 AM  Advanced Heart Failure Team Pager [phone] (M-F; 7a - 4p)  Please contact [facility] for night-coverage after hours (4p -7a ) and weekends on [email or web address]   NE off. On milrinone 0.125. Co-ox ok. CVP still up. Remains in AF with RVR. Continue to cough and have mild SOB.O2 requirement up to 8L. CXR much improved.   General:  Obese male lying in bed. On 8L O2 HEENT:  normal Neck: supple. JVP up Carotids 2+ bilat; no bruits. No lymphadenopathy or thryomegaly appreciated. Cor: PMI nondisplaced.Irreg tachy . No rubs, gallops or murmurs. Lungs: coarse Abdomen: obese soft, nontender, nondistended. No hepatosplenomegaly. No bruits or masses. Good bowel sounds. Extremities: no cyanosis, clubbing, rash, 1+ edema Neuro: alert & orientedx3, cranial nerves grossly intact. moves all 4 extremities w/o difficulty. Affect pleasant  O2 requirement up today but CXR improved. CVP remains elevated. Co-ox  ok. Continue IV diuresis and IV amio. Plan R/L cath today exclude high-grade surgical disease. Will 1-2 more days for GI tract to heal and then start AC and if tolerates plan TEE/DC-CV next week.   Glori Bickers, MD  11:28 AM

## 2020-08-31 NOTE — Progress Notes (Signed)
TR band  Removed, site covered with gauze and medipore tape. No active bleeding noted. Patient made aware not to put a lot of pressure on left wrist. Left brachial site intact, no bleeding noted.

## 2020-08-31 NOTE — H&P (View-Only) (Signed)
Advanced Heart Failure Rounding Note   Subjective:   08/29/20 S/P EGD - gastritis. Recommendations for carafate +IV protonix.    Yesterday norepi stopped. On milrinone 0.125 mcg. CO-OX 65%   Complaining of cough. Asking when he is getting out of here.     Objective:   Weight Range:  Vital Signs:   Temp:  [98.1 F (36.7 C)-98.8 F (37.1 C)] 98.2 F (36.8 C) (02/03 0000) Pulse Rate:  [33-125] 99 (02/03 0800) Resp:  [13-22] 15 (02/03 0800) BP: (86-117)/(59-81) 98/62 (02/03 0800) SpO2:  [90 %-100 %] 91 % (02/03 0800) Arterial Line BP: (88-128)/(41-69) 104/52 (02/03 0800) Weight:  [105.8 kg] 105.8 kg (02/02 1220) Last BM Date: 08/30/20  Weight change: Filed Weights   08/29/20 0500 08/30/20 0500 08/30/20 1220  Weight: 109.8 kg 106.1 kg 105.8 kg    Intake/Output:   Intake/Output Summary (Last 24 hours) at 08/31/2020 0947 Last data filed at 08/31/2020 0900 Gross per 24 hour  Intake 1127.75 ml  Output 3405 ml  Net -2277.25 ml    CVP 12 Physical Exam:  General: appears chronically ill.   No resp difficulty HEENT: normal Neck: supple. JVP 11-12 . Carotids 2+ bilat; no bruits. No lymphadenopathy or thryomegaly appreciated. Cor: PMI nondisplaced. Irregular rate & rhythm. No rubs, gallops or murmurs. Left subclavian  Lungs: EW throughout 8 liters Abdomen: soft, nontender, nondistended. No hepatosplenomegaly. No bruits or masses. Good bowel sounds. Extremities: no cyanosis, clubbing, rash, edema Neuro: alert & orientedx3, cranial nerves grossly intact. moves all 4 extremities w/o difficulty. Affect pleasant   Telemetry:  A fib 110-120s    Labs: Basic Metabolic Panel: Recent Labs  Lab 08/26/20 0934 08/26/20 2323 08/27/20 0344 08/27/20 1315 08/28/20 0313 08/29/20 0329 08/30/20 0453 08/31/20 0531  NA 131*   < > 132* 133* 135 135 136 133*  K 5.5*   < > 5.4* 4.7 4.2 3.9 3.5 3.4*  CL 97*   < > 96* 92* 87* 84* 82* 80*  CO2 25   < > 25 28 33* 37* 44* 43*  GLUCOSE  131*   < > 135* 120* 140* 152* 122* 106*  BUN 44*   < > 43* 42* 38* 30* 24* 22  CREATININE 2.59*   < > 2.01* 1.90* 1.61* 1.31* 1.14 1.01  CALCIUM 8.0*   < > 8.2* 8.3* 8.5* 8.2* 8.4* 8.0*  MG 2.1  --  2.0 2.0 1.7 1.9 2.1 1.7  PHOS 5.6*  --  5.0*  --  4.1 2.7 3.0  --    < > = values in this interval not displayed.    Liver Function Tests: Recent Labs  Lab 08/26/20 2323 08/27/20 0344 08/28/20 0313 08/29/20 0329 08/30/20 0453  AST 95* 85* 54* 40 35  ALT 84* 77* 60* 51* 44  ALKPHOS 71 67 69 64 72  BILITOT 1.0 1.2 1.4* 1.8* 1.8*  PROT 6.6 6.6 6.9 6.5 6.7  ALBUMIN 3.2* 3.2* 3.2* 3.1* 3.1*   No results for input(s): LIPASE, AMYLASE in the last 168 hours. No results for input(s): AMMONIA in the last 168 hours.  CBC: Recent Labs  Lab 08/26/20 2323 08/27/20 0344 08/27/20 1315 08/28/20 0313 08/29/20 0329 08/30/20 0453 08/31/20 0531  WBC 4.0 6.4 6.9 10.3 12.2* 11.2* 9.5  NEUTROABS 3.2 5.7  --  9.4* 11.1* 8.5*  --   HGB 8.7* 8.6* 8.5* 8.9* 9.1* 9.0* 8.9*  HCT 30.8* 28.8* 28.2* 31.3* 31.0* 33.9* 33.3*  MCV 63.4* 63.0* 62.1* 63.4* 62.9* 66.5* 67.1*  PLT 191 186 182 203 200 189 207    Cardiac Enzymes: No results for input(s): CKTOTAL, CKMB, CKMBINDEX, TROPONINI in the last 168 hours.  BNP: BNP (last 3 results) Recent Labs    08/25/20 1452  BNP 527.0*    ProBNP (last 3 results) No results for input(s): PROBNP in the last 8760 hours.    Other results:  Imaging: DG Chest 1 View  Result Date: 08/31/2020 CLINICAL DATA:  Dyspnea, pulmonary edema EXAM: CHEST  1 VIEW COMPARISON:  08/27/2020 FINDINGS: Left subclavian central venous catheter tip is again seen within the superior vena cava. Pulmonary insufflation is normal and symmetric. Perihilar pulmonary edema has improved in the interval since prior examination, now mild in severity. No pneumothorax or pleural effusion. Mild cardiomegaly is stable. No acute bone abnormality. IMPRESSION: Stable cardiomegaly. Improving  pulmonary edema. Resolved right pleural effusion. Electronically Signed   By: Fidela Salisbury MD   On: 08/31/2020 08:20   CT ABDOMEN PELVIS W CONTRAST  Result Date: 08/30/2020 CLINICAL DATA:  Anemia, patient is not a colonoscopy candidate, assess for intestinal malignancy or other abnormality. EXAM: CT ABDOMEN AND PELVIS WITH CONTRAST TECHNIQUE: Multidetector CT imaging of the abdomen and pelvis was performed using the standard protocol following bolus administration of intravenous contrast. CONTRAST:  155m OMNIPAQUE IOHEXOL 300 MG/ML  SOLN COMPARISON:  None. FINDINGS: Lower chest: Moderate right, small left pleural effusions and associated atelectasis or consolidation. Cardiomegaly. Coronary artery calcifications. Hepatobiliary: No solid liver abnormality is seen. No gallstones, gallbladder wall thickening, or biliary dilatation. Pancreas: Unremarkable. No pancreatic ductal dilatation or surrounding inflammatory changes. Spleen: Normal in size without significant abnormality. Adrenals/Urinary Tract: Adrenal glands are unremarkable. Kidneys are normal, without renal calculi, solid lesion, or hydronephrosis. Foley catheter within the urinary bladder. Stomach/Bowel: Stomach is within normal limits. Appendix appears normal. No evidence of bowel wall thickening, distention, or inflammatory changes. Occasional sigmoid diverticula Vascular/Lymphatic: Aortic atherosclerosis. No enlarged abdominal or pelvic lymph nodes. Reproductive: Prostatomegaly. Other: No abdominal wall hernia or abnormality. No abdominopelvic ascites. Musculoskeletal: No acute or significant osseous findings. IMPRESSION: 1. No CT findings of the abdomen or pelvis to explain anemia. No mass or other abnormality of the bowel identified. 2. Occasional sigmoid diverticula without evidence of acute diverticulitis. 3. Prostatomegaly. 4. Moderate right, small left pleural effusions and associated atelectasis or consolidation. 5. Coronary artery disease.  Aortic Atherosclerosis (ICD10-I70.0). Electronically Signed   By: AEddie CandleM.D.   On: 08/30/2020 14:24     Medications:     Scheduled Medications: . albuterol  2 puff Inhalation Q6H  . vitamin C  500 mg Oral Daily  . Chlorhexidine Gluconate Cloth  6 each Topical Daily  . dextromethorphan-guaiFENesin  1 tablet Oral BID  . fluticasone furoate-vilanterol  1 puff Inhalation Daily  . folic acid  1 mg Oral Daily  . insulin aspart  0-5 Units Subcutaneous QHS  . insulin aspart  0-9 Units Subcutaneous TID WC  . mouth rinse  15 mL Mouth Rinse BID  . pantoprazole (PROTONIX) IV  40 mg Intravenous Q12H  . ramelteon  8 mg Oral QHS  . simvastatin  10 mg Oral q1800  . sodium chloride flush  10-40 mL Intracatheter Q12H  . sodium chloride flush  3 mL Intravenous Q12H  . spironolactone  25 mg Oral Daily  . sucralfate  1 g Oral QID  . thiamine  100 mg Oral Daily  . umeclidinium bromide  1 puff Inhalation Daily  . zinc sulfate  220 mg Oral Daily  Infusions: . sodium chloride 250 mL (08/29/20 1900)  . sodium chloride    . sodium chloride 10 mL/hr at 08/31/20 0458  . amiodarone 30 mg/hr (08/31/20 0900)  . doxycycline (VIBRAMYCIN) IV 125 mL/hr at 08/31/20 0900  . magnesium sulfate bolus IVPB 2 g (08/31/20 0915)  . milrinone 0.125 mcg/kg/min (08/31/20 0900)  . norepinephrine (LEVOPHED) Adult infusion Stopped (08/30/20 1119)    PRN Medications: sodium chloride, acetaminophen, chlorpheniramine-HYDROcodone, guaiFENesin-dextromethorphan, sodium chloride flush, sodium chloride flush   Assessment/Plan:    1. 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  - CVP 12-13. Increase lasix to 80 mg twice a day. Renal function stable.   -Continue spiro to 25 mg daily.  - Will need R/L cath today.  Acute hypoxic respiratory  failure in setting of COVID PNA /AECOPD - Unvacinated CXR shows RML/RLL opacity.  Requiring 3L of oxygen via [place].  - Treatment per CCM: Remdesivr, Dexamethesone 6 IV, neb and doxycycline.   3. Persistent AF with RVR - has h/o AF at home. Unclear if this is chronic or not. Given that he is on amio at home suspect not chronic  - Give 150  Bolus and increase amio to 60 mg per hour. Place SCDs  - off AC due to GIB - Will need TEE/DC-CV once okay from GI perspective and after L/RHC 4. GIB/Anemia -On eliquis for afib, presentation hgb 6.1, +FOBT; Ferritin 27, iron 13, folate 10.9.  S/p transfused 3 units of PRBCs -> hgb 8.7.  MCV 63. -S/P EGD 2/1 with gastritis. On protonix + carafate.  -Per GI--> not a candidate for Colonoscopy at this time. If further evaluation to ensure that a large mass or lesion is not present such that further evaluation would be required before consideration of RHC/LHC and anticoagulation needs, then would recommend a CT-AP with IV/PO contrast when felt to be safe to administer for best attempt at further characterization (maybe this can be obtained in the next 24-36 hours to ensure as much clinical safetly prior to potential need for larger doses of anticoagulation. -Hgb 9.5  -Hold anticoagulation for now - continue PPI 5. AKI - Likely due to ATN/cardiorenal - Baseline Cr 1.15, peak Cr 2.59 - Stable 1 6. DM2 - per CCM  7. Hypovolemic Hyponatremic -Na2+ 131 -> 133 ->135 ->135->136-> 133  -BMET in am.   Will set up RHC/LHC today.    Length of Stay: [place] NP 08/31/2020, 9:47 AM  Advanced Heart Failure Team Pager [phone] (M-F; 7a - 4p)  Please contact [facility] for night-coverage after hours (4p -7a ) and weekends on [email or web address]   [place] off. On milrinone 0.125. Co-ox ok. CVP still up. Remains in AF with RVR. Continue to cough and have mild SOB.O2 requirement up to 8L. CXR much improved.   General:  Obese male lying in bed. On 8L O2 HEENT:  normal Neck: supple. JVP up Carotids 2+ bilat; no bruits. No lymphadenopathy or thryomegaly appreciated. Cor: PMI nondisplaced.Irreg tachy . No rubs, gallops or murmurs. Lungs: coarse Abdomen: obese soft, nontender, nondistended. No hepatosplenomegaly. No bruits or masses. Good bowel sounds. Extremities: no cyanosis, clubbing, rash, 1+ edema Neuro: alert & orientedx3, cranial nerves grossly intact. moves all 4 extremities w/o difficulty. Affect pleasant  O2 requirement up today but CXR improved. CVP remains elevated. Co-ox  ok. Continue IV diuresis and IV amio. Plan R/L cath today exclude high-grade surgical disease. Will 1-2 more days for GI tract to heal and then start AC and if tolerates plan TEE/DC-CV next week.   Glori Bickers, MD  11:28 AM

## 2020-08-31 NOTE — Progress Notes (Signed)
NAME:  Perry Hunter, MRN:  [id number], DOB:  1951-01-31, LOS: 6 ADMISSION DATE:  08/25/2020, CONSULTATION DATE:  08/26/20 REFERRING MD:  Dr Manuella Ghazi, CHIEF COMPLAINT:  Chf, gib  Brief History   70 yo male with pmh dm2, GERD, tobacco abuse, copd presented to osh with worsening sob/doe and edema as well as black stool x1 month.   History of present illness   70 yo male with pmh HTN, Hyperlipidemia, T2DM, GERD, tobacco abuse, copd, paf on eliquis who presented to osh at the insistence of his family for weeks of increasing abdominal girth/le edema, sob/doe and black tarry stools for 1 month.   Upon presentation pt was found to have hgb of 6, tachycardic, infiltrate on cxr (noted to be covid + as well). He was grossly volume up with hypotension and hemoccult positive.   Pt was started on neo and uptitrated to >149mg, subsequently transitioned to dopamine after conversation with cardiology by Dr SManuella Ghazi Echo was done showing LVEF 20-25% with grade 3 diastolic dysfunction and severe RH dysfunction with RVSP 44.427mg  GI was consulted for his +hemoccult and recommended endoscopy when pt stabilized. At this time he has received 3u prbc.   Pt arrived to our ICU was alert and oriented x2.  On 5 mcg of dopamine.  Satting in 100s on 4 L.  Denied any overt shortness of breath.  Denied any GI symptoms did endorse melena previously consent was obtained from patient and wife for central line and A-line.   Past Medical History   Past Medical History:  Diagnosis Date  . Arthritis   . Asthma   . CHF (congestive heart failure) ([facility]  . Diabetes mellitus without complication ([facility] [place] [facility] [place]  . GERD (gastroesophageal reflux disease)   . Hypercholesteremia   . Hypertension    Also with [facility]  [facility]   [facility] [place]   Procedures:  Central line A-line  Significant Diagnostic Tests:  1/29 echo: LVEF 20-25% grade  III diastolic dysfunction, rh severely dysfunctional.  1/30 abdominal Us>>  Micro Data:  1/28 sars2: positive  Antimicrobials:  N/A  Interim history/subjective:  Breathing worse today. Denies pain. Diuresing well.  Objective   Blood pressure 115/71, pulse 100, temperature 98.2 F (36.8 C), temperature source Oral, resp. rate 14, height '5\' 8"'$  (1.727 m), weight 105.8 kg, SpO2 95 %. CVP:  [4 mmHg-35 mmHg] 10 mmHg      Intake/Output Summary (Last 24 hours) at 08/31/2020 0756 Last data filed at 08/31/2020 0600 Gross per 24 hour  Intake 1344.12 ml  Output 3460 ml  Net -2115.88 ml   Filed Weights   08/29/20 0500 08/30/20 0500 08/30/20 1220  Weight: 109.8 kg 106.1 kg 105.8 kg    Examination: Constitutional: no acute distress lying in bed  Eyes: EOMI, pupils equal Ears, nose, mouth, and throat: MMM, trachea midline Cardiovascular: RRR, ext warm Respiratory: Diminished at bases, wheezing at apices, no accessory muscle use Gastrointestinal: Soft, +BS Skin: No rashes, normal turgor Neurologic: moves all 4 ext, globally weak Psychiatric: RASS 0  Cr improved -2.1L K low: repleting Mag low: repleting  Resolved Hospital Problem list   N/A  Assessment & Plan:   Acute hypoxic respiratory failure due to COVID-19 infection- incidental, pulmonary edema primary driver; usual airborne isolation period. S/p 3 days remdesivir ppx COPD- thought was in flare but probably just fluid and now near baseline resp status Tobacco use  disorder  P -Supplemental O2 as needed  -IS, needs to get out of bed -Incruse/breo -IV doxycycline x 7 days  Acute HFrEF Cardiogenic Shock Cardiorenal syndrome EF 20-25%m Grade III diastolic dysfunction Low SvO2 P -milrinone and diuresis per CHF team -Tentative place for RHC, LHC today - DAPT/AC challenge per CHF team  Afib on eliquis-  stable - Continue amiodartone - Maintain normal electrolytes - Eventual TEE and cardioversion  Subacute GIB-  looks to be due to severe esophagitis, CT A/P benign - Continue PPI - AC and DAPT challenge per CHF team  Hematologic abnormalities: would not worry about in setting of COVID, acute cardiorenal syndrome.  Can recheck diff as OP.  DM2- on lower side, PO status variable - hold levemir today, SSI  Abnormal chest radiograph- small R effusion exagerrated on film with bedside US 1/31 - Repeat CXR today  ICU delirium- improved off steroids, continue ramelteon, encourage day/night cycles  Best practice:  Diet: advance as tolerated Pain/Anxiety/Delirium protocol (if indicated): not indicated VAP protocol (if indicated): n/a DVT prophylaxis: with GIB, SCD for now GI prophylaxis: PPI BID Glucose control: as above Mobility: Bed rest Code Status: Full Family Communication: updated patient Disposition: ICU  Patient critically ill due to shock, delirium Interventions to address this today pressor titration, reorientation Risk of deterioration without these interventions is high  I personally spent 35 minutes providing critical care not including any separately billable procedures  Erskine Emery MD [place] [facility] 08/28/2020 7:23 AM Prefer epic messenger for cross cover needs If after hours, please call E-link

## 2020-08-31 NOTE — Interval H&P Note (Signed)
History and Physical Interval Note:  08/31/2020 2:04 PM  Perry Hunter  has presented today for surgery, with the diagnosis of chf.  The various methods of treatment have been discussed with the patient and family. After consideration of risks, benefits and other options for treatment, the patient has consented to  Procedure(s): RIGHT/LEFT HEART CATH AND CORONARY ANGIOGRAPHY (N/A) and possible coronary angioplasty as a surgical intervention.  The patient's history has been reviewed, patient examined, no change in status, stable for surgery.  I have reviewed the patient's chart and labs.  Questions were answered to the patient's satisfaction.     Perry Hunter

## 2020-08-31 NOTE — Progress Notes (Signed)
Cath lab expressed concern over patients ability to comprehend catheterization. Unable to reach wife by phone so daughter Rigoberto Noel contacted and gave this RN and Andreas Blower, RN verbal permission to proceed with left and right heart catheterization with possible intervention. New consent form made and delivered to cath lab.

## 2020-08-31 NOTE — Evaluation (Signed)
Physical Therapy Evaluation Patient Details Name: Perry Hunter MRN: [id number] DOB: [birth year] Today's Date: 08/31/2020   History of Present Illness  70 yo male with pmh dm2, GERD, tobacco abuse, copd presented to osh with worsening sob/doe and edema as well as black stool x1 month. Upon presentation pt was found to have hgb of 6, tachycardic, infiltrate on cxr (noted to be covid + as well). He was grossly volume up with hypotension and hemoccult positive. Echo was done showing LVEF 20-25% with grade 3 diastolic dysfunction and severe RH dysfunction  Clinical Impression  Pt fully participated in session. RN requested to return to be due to pt going to cath lab. Pt was I with use of cane prior to admission and lives at home with his daughter in a second floor apt. Pt states she can help as needed. Pt with improve SpO2 with movement. Pt with some difficulty following commands and confusion in standing. Pt performed bed mobility with minimal assist and mod A for OOB activity. Pt will benefit from skilled PT to address deficits with balance, strength, coordination, gait, endurance and safety to maximize independence with functional mobility prior to discharge.     Follow Up Recommendations Home health PT;Supervision for mobility/OOB    Equipment Recommendations  Rolling walker with 5" wheels;3in1 (PT)    Recommendations for Other Services       Precautions / Restrictions Precautions Precautions: Fall Restrictions Weight Bearing Restrictions: No      Mobility  Bed Mobility Overal bed mobility: Needs Assistance Bed Mobility: Rolling;Supine to Sit;Sit to Supine Rolling: Supervision   Supine to sit: HOB elevated;Min assist Sit to supine: Min guard        Transfers Overall transfer level: Needs assistance Equipment used: Rolling walker (2 wheeled) Transfers: Sit to/from Stand;Lateral/Scoot Transfers Sit to Stand: Mod assist        Lateral/Scoot Transfers: Mod assist General  transfer comment: performed side stepping to [facility] with pt becoming confused and attempting to turn around, verbal cueing needed for hand placement on RW as well as step by step cueing for side stepping  Ambulation/Gait                Stairs            Wheelchair Mobility    Modified Rankin (Stroke Patients Only)       Balance Overall balance assessment: Needs assistance Sitting-balance support: Single extremity supported;Feet unsupported Sitting balance-Leahy Scale: Fair     Standing balance support: Bilateral upper extremity supported Standing balance-Leahy Scale: Poor                               Pertinent Vitals/Pain Pain Assessment: Faces Pain Score: 5  Pain Location: general discomfort; when asked about pain states no pain    Home Living Family/patient expects to be discharged to:: Private residence Living Arrangements: Spouse/significant other;Children Available Help at Discharge: Family;Available 24 hours/day Type of Home: Apartment Home Access: Stairs to enter Entrance Stairs-Rails: Right Entrance Stairs-Number of Steps: flight with railing on the R Home Layout: One level Home Equipment: Shower seat;Grab bars - tub/shower;Cane - single point      Prior Function Level of Independence: Independent with assistive device(s)               Hand Dominance   Dominant Hand: Right    Extremity/Trunk Assessment   Upper Extremity Assessment Upper Extremity Assessment: Defer to OT evaluation  Lower Extremity Assessment Lower Extremity Assessment: Generalized weakness       Communication   Communication: HOH  Cognition Arousal/Alertness: Lethargic Behavior During Therapy: WFL for tasks assessed/performed Overall Cognitive Status: Within Functional Limits for tasks assessed                                        General Comments General comments (skin integrity, edema, etc.): VSS with SpO2 improving to 97%  when sitting; pt on 8 L HFNC    Exercises     Assessment/Plan    PT Assessment Patient needs continued PT services  PT Problem List Decreased strength;Decreased mobility;Decreased safety awareness;Decreased coordination;Decreased activity tolerance;Decreased balance;Decreased knowledge of use of DME       PT Treatment Interventions DME instruction;Therapeutic exercise;Gait training;Balance training;Stair training;Neuromuscular re-education;Therapeutic activities;Patient/family education    PT Goals (Current goals can be found in the Care Plan section)  Acute Rehab PT Goals Patient Stated Goal: When can I get out of here PT Goal Formulation: With patient Time For Goal Achievement: 09/14/20 Potential to Achieve Goals: Good    Frequency Min 3X/week   Barriers to discharge        Co-evaluation               AM-PAC PT "6 Clicks" Mobility  Outcome Measure Help needed turning from your back to your side while in a flat bed without using bedrails?: A Little Help needed moving from lying on your back to sitting on the side of a flat bed without using bedrails?: A Little Help needed moving to and from a bed to a chair (including a wheelchair)?: A Lot Help needed standing up from a chair using your arms (e.g., wheelchair or bedside chair)?: A Lot Help needed to walk in hospital room?: A Lot Help needed climbing 3-5 steps with a railing? : Total 6 Click Score: 13    End of Session Equipment Utilized During Treatment: Gait belt;Oxygen Activity Tolerance: Patient tolerated treatment well Patient left: in bed;with call bell/phone within reach;with nursing/sitter in room Nurse Communication: Mobility status PT Visit Diagnosis: Unsteadiness on feet (R26.81);Muscle weakness (generalized) (M62.81);Other abnormalities of gait and mobility (R26.89)    Time: 1354-1420 PT Time Calculation (min) (ACUTE ONLY): 26 min   Charges:   PT Evaluation $PT Eval Low Complexity: 1 Low PT  Treatments $Therapeutic Activity: 8-22 mins        Lyanne Co, DPT Acute Rehabilitation Services [id number]  [place] 08/31/2020, 2:37 PM

## 2020-09-01 ENCOUNTER — Encounter (HOSPITAL_COMMUNITY): Payer: Self-pay | Admitting: Internal Medicine

## 2020-09-01 DIAGNOSIS — R570 Cardiogenic shock: Secondary | ICD-10-CM | POA: Diagnosis not present

## 2020-09-01 DIAGNOSIS — I4819 Other persistent atrial fibrillation: Secondary | ICD-10-CM | POA: Diagnosis not present

## 2020-09-01 DIAGNOSIS — K254 Chronic or unspecified gastric ulcer with hemorrhage: Secondary | ICD-10-CM | POA: Diagnosis not present

## 2020-09-01 LAB — COOXEMETRY PANEL
Carboxyhemoglobin: 1.7 % — ABNORMAL HIGH (ref 0.5–1.5)
Carboxyhemoglobin: 1.6 % — ABNORMAL HIGH (ref 0.5–1.5)
Methemoglobin: 0.9 % (ref 0.0–1.5)
Methemoglobin: 0.9 % (ref 0.0–1.5)
O2 Saturation: 99.5 %
O2 Saturation: 73.9 %
Total hemoglobin: 8.9 g/dL — ABNORMAL LOW (ref 12.0–16.0)
Total hemoglobin: 9.3 g/dL — ABNORMAL LOW (ref 12.0–16.0)

## 2020-09-01 LAB — CBC
HCT: 32.4 % — ABNORMAL LOW (ref 39.0–52.0)
Hemoglobin: 9 g/dL — ABNORMAL LOW (ref 13.0–17.0)
MCH: 18.4 pg — ABNORMAL LOW (ref 26.0–34.0)
MCHC: 27.8 g/dL — ABNORMAL LOW (ref 30.0–36.0)
MCV: 66.4 fL — ABNORMAL LOW (ref 80.0–100.0)
Platelets: 211 10*3/uL (ref 150–400)
RBC: 4.88 MIL/uL (ref 4.22–5.81)
RDW: 27.9 % — ABNORMAL HIGH (ref 11.5–15.5)
WBC: 12.5 10*3/uL — ABNORMAL HIGH (ref 4.0–10.5)
nRBC: 1.5 % — ABNORMAL HIGH (ref 0.0–0.2)

## 2020-09-01 LAB — BASIC METABOLIC PANEL
Anion gap: 9 (ref 5–15)
BUN: 21 mg/dL (ref 8–23)
CO2: 41 mmol/L — ABNORMAL HIGH (ref 22–32)
Calcium: 8.2 mg/dL — ABNORMAL LOW (ref 8.9–10.3)
Chloride: 82 mmol/L — ABNORMAL LOW (ref 98–111)
Creatinine, Ser: 0.97 mg/dL (ref 0.61–1.24)
GFR, Estimated: >60 mL/min (ref >60)
Glucose, Bld: 130 mg/dL — ABNORMAL HIGH (ref 70–99)
Potassium: 4.4 mmol/L (ref 3.5–5.1)
Sodium: 132 mmol/L — ABNORMAL LOW (ref 135–145)

## 2020-09-01 LAB — GLUCOSE, CAPILLARY
Glucose-Capillary: 105 mg/dL — ABNORMAL HIGH (ref 70–99)
Glucose-Capillary: 133 mg/dL — ABNORMAL HIGH (ref 70–99)
Glucose-Capillary: 148 mg/dL — ABNORMAL HIGH (ref 70–99)
Glucose-Capillary: 128 mg/dL — ABNORMAL HIGH (ref 70–99)
Glucose-Capillary: 172 mg/dL — ABNORMAL HIGH (ref 70–99)
Glucose-Capillary: 135 mg/dL — ABNORMAL HIGH (ref 70–99)
Glucose-Capillary: 130 mg/dL — ABNORMAL HIGH (ref 70–99)

## 2020-09-01 LAB — SURGICAL PATHOLOGY

## 2020-09-01 MED ORDER — ADULT MULTIVITAMIN W/MINERALS CH
1.0000 | ORAL_TABLET | Freq: Every day | ORAL | Status: DC
  Administered 2020-09-01 – 2020-09-09 (×9): 1 via ORAL
  Filled 2020-09-01 (×9): qty 1

## 2020-09-01 MED ORDER — ROSUVASTATIN CALCIUM 20 MG PO TABS
20.0000 mg | ORAL_TABLET | Freq: Every day | ORAL | Status: DC
  Administered 2020-09-01 – 2020-09-09 (×9): 20 mg via ORAL
  Filled 2020-09-01 (×2): qty 1
  Filled 2020-09-01: qty 4
  Filled 2020-09-01 (×6): qty 1

## 2020-09-01 MED ORDER — ACETAMINOPHEN 325 MG PO TABS
650.0000 mg | ORAL_TABLET | ORAL | Status: DC | PRN
  Administered 2020-09-06 – 2020-09-08 (×3): 650 mg via ORAL
  Filled 2020-09-01 (×3): qty 2

## 2020-09-01 MED ORDER — ENSURE ENLIVE PO LIQD
237.0000 mL | Freq: Three times a day (TID) | ORAL | Status: DC
  Administered 2020-09-01 – 2020-09-05 (×8): 237 mL via ORAL
  Filled 2020-09-01: qty 237

## 2020-09-01 NOTE — Progress Notes (Signed)
Initial Nutrition Assessment  DOCUMENTATION CODES:   Not applicable  INTERVENTION:   Liberalize diet but keep fluid restriction    Ensure Enlive po TID, each supplement provides 350 kcal and 20 grams of protein  MVI daily  NUTRITION DIAGNOSIS:   Inadequate oral intake related to decreased appetite as evidenced by meal completion < 50%.  GOAL:   Patient will meet greater than or equal to 90% of their needs  MONITOR:   PO intake,Supplement acceptance,Weight trends,Labs,I & O's,Skin  REASON FOR ASSESSMENT:   Rounds    ASSESSMENT:   Patient with PMH significant for DM, GERD, COPD, HTN, mand CHF. Presents this admission with COVID 19 infection and subacute GI bleed.   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 Per RN, patient with poor appetite over the last few days. Last two meal completions charted as 100% and 5%. RD to provide supplementation to maximize kcal and protein this admission. Of note electrolytes look stable. Sent message to provider to liberalize diet but keep fluid restriction. Awaiting response.   Weight history limited over the last year. Shows to trend down with diuresis this admission. Utilize EDW of 105 kg for now.   Medications: 500 mg vitamin C, folic acid, 80 mg lasix BID, SS novolog, 40 mEq KCl BID, thiamine, 220 mg zinc sulfate Labs: Na 132 (L) CBG 102-172  Diet Order:   Diet Order            Diet renal/carb modified with fluid restriction Diet-HS Snack? Nothing; Fluid restriction: 1200 mL Fluid; Room service appropriate? Yes; Fluid consistency: Thin  Diet effective now                 EDUCATION NEEDS:   Education needs have been addressed  Skin:  Skin Assessment: Skin Integrity Issues: Skin Integrity Issues:: Other (Comment) Other: skin tear-L elbow  Last BM:  2/4  Height:   Ht Readings from Last 1 Encounters:  08/25/20  '5\' 8"'$  (1.727 m)    Weight:   Wt Readings from Last 1 Encounters:  09/01/20 105 kg    BMI:  Body mass index is 35.2 kg/m.  Estimated Nutritional Needs:   Kcal:  2300-2500 kcal  Protein:  100-120 grams  Fluid:  1.2 L fluid restriction  Mariana Single RD, LDN Clinical Nutrition Pager listed in [place]

## 2020-09-01 NOTE — Progress Notes (Signed)
NAME:  Perry Hunter, MRN:  [id number], DOB:  Jun 22, 1951, LOS: 7 ADMISSION DATE:  08/25/2020, CONSULTATION DATE:  08/26/20 REFERRING MD:  Dr Manuella Ghazi, CHIEF COMPLAINT:  Chf, gib  Brief History   70 yo male with pmh dm2, GERD, tobacco abuse, copd presented to osh with worsening sob/doe and edema as well as black stool x1 month.   History of present illness   70 yo male with pmh HTN, Hyperlipidemia, T2DM, GERD, tobacco abuse, copd, paf on eliquis who presented to osh at the insistence of his family for weeks of increasing abdominal girth/le edema, sob/doe and black tarry stools for 1 month.   Upon presentation pt was found to have hgb of 6, tachycardic, infiltrate on cxr (noted to be covid + as well). He was grossly volume up with hypotension and hemoccult positive.   Pt was started on neo and uptitrated to >149mg, subsequently transitioned to dopamine after conversation with cardiology by Dr SManuella Ghazi Echo was done showing LVEF 20-25% with grade 3 diastolic dysfunction and severe RH dysfunction with RVSP 44.453mg  GI was consulted for his +hemoccult and recommended endoscopy when pt stabilized. At this time he has received 3u prbc.   Pt arrived to our ICU was alert and oriented x2.  On 5 mcg of dopamine.  Satting in 100s on 4 L.  Denied any overt shortness of breath.  Denied any GI symptoms did endorse melena previously consent was obtained from patient and wife for central line and A-line.   Past Medical History   Past Medical History:  Diagnosis Date  . Arthritis   . Asthma   . CHF (congestive heart failure) (HCAc[place]  . Diabetes mellitus without complication (HCCuster  . GERD (gastroesophageal reflux disease)   . Hypercholesteremia   . Hypertension    Also with history of A. fib.  [facility]   [facility]   Procedures:  Central line A-line  Significant Diagnostic Tests:  1/29 echo: LVEF 20-25% grade  III diastolic dysfunction, rh severely dysfunctional.  1/30 abdominal Us>>  Micro Data:  1/28 sars2: positive  Antimicrobials:  N/A  Interim history/subjective:  Breathing improved. Still volume overloaded on cath. Remains on inotropes/amio/diuretics  Objective   Blood pressure 93/65, pulse (!) 102, temperature 98 F (36.7 C), temperature source Oral, resp. rate 16, height '5\' 8"'$  (1.727 m), weight 105 kg, SpO2 98 %. CVP:  [5 mmHg-21 mmHg] 6 mmHg      Intake/Output Summary (Last 24 hours) at 09/01/2020 0931 Last data filed at 09/01/2020 [id number] per 24 hour  Intake 1342.81 ml  Output 1836 ml  Net -493.19 ml   Filed Weights   08/30/20 0500 08/30/20 1220 09/01/20 0500  Weight: 106.1 kg 105.8 kg 105 kg    Examination: Constitutional: no acute distress, chronically ill appearing  Eyes: eomi, pupils equal Ears, nose, mouth, and throat: MMM, trachea midline Cardiovascular: irregular, ext warm Respiratory: wheezing better today, pulling ~300 on IS Gastrointestinal: Soft, +BS Skin: No rashes, normal turgor Neurologic: moves all 4 ext Psychiatric: RASS 0  Net -500 RHC Ao = 102/65 (80)  LV =  93/25 RA = 14 RV = 52/15 PA = 53/19 (35) PCW = 28 Fick cardiac output/index = 6.0/2.8 PVR = 1.1 WU Ao sat = 93% PA sat = 51%, 54%  LHC  Prox RCA lesion is 99% stenosed.  Mid LAD to Dist LAD lesion is 40% stenosed.  Prox LAD to Mid LAD lesion is 30% stenosed.  Mid Cx lesion is 100% stenosed.  Dist Cx lesion is 90% stenosed.   SvO2 74%? Very labile and not sure accurate  Resolved Hospital Problem list   N/A  Assessment & Plan:   Acute hypoxic respiratory failure due to COVID-19 infection- incidental, pulmonary edema primary driver; usual airborne isolation period. S/p 3 days remdesivir ppx COPD- thought was in flare but probably just fluid and now near baseline resp status Tobacco use disorder  Seems a bit better today -Wean O2 for sats > 88% -IS, needs to get out  of bed -Incruse/breo -IV doxycycline x 7 days -Push mobility, appreciate PT input  Acute HFrEF Cardiogenic Shock- CI good with milrinone Cardiorenal syndrome - Inotropes and diuresis per CHF team - AC challenge in a day or two per CHF allowing GI tract to heal  Afib on eliquis-  stable - Continue amiodarone, ?if can convert to PO - Maintain normal electrolytes - Eventual TEE and cardioversion  Subacute GIB- looks to be due to severe esophagitis, CT A/P benign - Continue PPI - AC and DAPT challenge per CHF team  Hematologic abnormalities: would not worry about in setting of COVID, acute cardiorenal syndrome.  Can recheck diff as OP.  DM2- well controlled on SSI  ICU delirium- improved off steroids, continue ramelteon, encourage day/night cycles  Best practice:  Diet: advance as tolerated Pain/Anxiety/Delirium protocol (if indicated): not indicated VAP protocol (if indicated): n/a DVT prophylaxis: with GIB, SCD for now GI prophylaxis: PPI BID Glucose control: as above Mobility: up to chair Code Status: Full Family Communication: updated patient Disposition: ICU  Patient critically ill due to cardiogenic shock, respiratory failure Interventions to address this today O2 and inotrope titration Risk of deterioration without these interventions is high  I personally spent 34 minutes providing critical care not including any separately billable procedures  Erskine Emery MD [place] [facility] 08/28/2020 7:23 AM Prefer epic messenger for cross cover needs If after hours, please call E-link

## 2020-09-01 NOTE — Plan of Care (Signed)

## 2020-09-01 NOTE — Progress Notes (Addendum)
Advanced Heart Failure Rounding Note   Subjective:   08/29/20 S/P EGD - gastritis. Recommendations for carafate +IV protonix.    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 Elevated filling pressures with preserved CO.  Remains on milrinone 0.125. Co-ox 74%  -2L in UOP yesterday. SCr/K stable  Wt down 2 lb. Remains fluid overloaded.   Afib low 100s on amio gtt at 60/hr.   Higher supp O2 requirements. Now on 10L/min [place]    R/LHC   Prox RCA lesion is 99% stenosed.  Mid LAD to Dist LAD lesion is 40% stenosed.  Prox LAD to Mid LAD lesion is 30% stenosed.  Mid Cx lesion is 100% stenosed.  Dist Cx lesion is 90% stenosed.   Findings:  Ao = 102/65 (80)  LV =  93/25 RA = 14 RV = 52/15 PA = 53/19 (35) PCW = 28 Fick cardiac output/index = 6.0/2.8 PVR = 1.1 WU Ao sat = 93% PA sat = 51%, 54%  Objective:   Weight Range:  Vital Signs:   Temp:  [98 F (36.7 C)-98.5 F (36.9 C)] 98 F (36.7 C) (02/04 0300) Pulse Rate:  [61-257] 102 (02/04 0600) Resp:  [12-28] 16 (02/04 0600) BP: (81-225)/(55-198) 93/65 (02/04 0600) SpO2:  [85 %-100 %] 98 % (02/04 0600) Arterial Line BP: (64-144)/(53-141) 64/57 (02/04 0600) Weight:  [105 kg] 105 kg (02/04 0500) Last BM Date: 09/01/20  Weight change: Filed Weights   08/30/20 0500 08/30/20 1220 09/01/20 0500  Weight: 106.1 kg 105.8 kg 105 kg    Intake/Output:   Intake/Output Summary (Last 24 hours) at 09/01/2020 0815 Last data filed at 09/01/2020 [id number] Gross per 24 hour  Intake 1376.48 ml  Output 1836 ml  Net -459.52 ml    PHYSICAL EXAM: General:  Fatigue appearing elderly WM No respiratory difficulty HEENT: normal Neck: supple. JVD elevated. Carotids 2+ bilat; no bruits. No lymphadenopathy or thyromegaly appreciated. Cor: PMI nondisplaced. irregularly irregular rhythm, tachy rate. No rubs, gallops or murmurs. Lungs: clear Abdomen: soft,  nontender, nondistended. No hepatosplenomegaly. No bruits or masses. Good bowel sounds. Extremities: no cyanosis, clubbing, rash, edema Neuro: alert & oriented x 3, cranial nerves grossly intact. moves all 4 extremities w/o difficulty. Affect pleasant.   Telemetry:  A fib low 100s    Labs: Basic Metabolic Panel: Recent Labs  Lab 08/26/20 0934 08/26/20 2323 08/27/20 0344 08/27/20 1315 08/28/20 0313 08/29/20 0329 08/30/20 0453 08/31/20 0531 08/31/20 1622 08/31/20 1627 08/31/20 2125 09/01/20 0650  NA 131*   < > 132* 133* 135 135 136 133* 141 132*  134*  --  132*  K 5.5*   < > 5.4* 4.7 4.2 3.9 3.5 3.4* 3.4* 4.3  4.1  --  4.4  CL 97*   < > 96* 92* 87* 84* 82* 80*  --   --   --  82*  CO2 25   < > 25 28 33* 37* 44* 43*  --   --   --  41*  GLUCOSE 131*   < > 135* 120* 140* 152* 122* 106*  --   --   --  130*  BUN 44*   < > 43* 42* 38* 30* 24* 22  --   --   --  21  CREATININE 2.59*   < > 2.01* 1.90* 1.61* 1.31* 1.14 1.01  --   --  1.04 0.97  CALCIUM 8.0*   < > 8.2*  8.3* 8.5* 8.2* 8.4* 8.0*  --   --   --  8.2*  MG 2.1  --  2.0 2.0 1.7 1.9 2.1 1.7  --   --   --   --   PHOS 5.6*  --  5.0*  --  4.1 2.7 3.0  --   --   --   --   --    < > = values in this interval not displayed.    Liver Function Tests: Recent Labs  Lab 08/26/20 2323 08/27/20 0344 08/28/20 0313 08/29/20 0329 08/30/20 0453  AST 95* 85* 54* 40 35  ALT 84* 77* 60* 51* 44  ALKPHOS 71 67 69 64 72  BILITOT 1.0 1.2 1.4* 1.8* 1.8*  PROT 6.6 6.6 6.9 6.5 6.7  ALBUMIN 3.2* 3.2* 3.2* 3.1* 3.1*   No results for input(s): LIPASE, AMYLASE in the last 168 hours. No results for input(s): AMMONIA in the last 168 hours.  CBC: Recent Labs  Lab 08/26/20 2323 08/27/20 0344 08/27/20 1315 08/28/20 0313 08/29/20 0329 08/30/20 0453 08/31/20 0531 08/31/20 1622 08/31/20 1627 08/31/20 2125 09/01/20 0650  WBC 4.0 6.4   < > 10.3 12.2* 11.2* 9.5  --   --  9.9 12.5*  NEUTROABS 3.2 5.7  --  9.4* 11.1* 8.5*  --   --   --   --    --   HGB 8.7* 8.6*   < > 8.9* 9.1* 9.0* 8.9* 10.5* 12.9*  12.2* 9.1* 9.0*  HCT 30.8* 28.8*   < > 31.3* 31.0* 33.9* 33.3* 31.0* 38.0*  36.0* 33.7* 32.4*  MCV 63.4* 63.0*   < > 63.4* 62.9* 66.5* 67.1*  --   --  66.3* 66.4*  PLT 191 186   < > 203 200 189 207  --   --  208 211   < > = values in this interval not displayed.    Cardiac Enzymes: No results for input(s): CKTOTAL, CKMB, CKMBINDEX, TROPONINI in the last 168 hours.  BNP: BNP (last 3 results) Recent Labs    08/25/20 1452  BNP 527.0*    ProBNP (last 3 results) No results for input(s): PROBNP in the last 8760 hours.    Other results:  Imaging: DG Chest 1 View  Result Date: 08/31/2020 CLINICAL DATA:  Dyspnea, pulmonary edema EXAM: CHEST  1 VIEW COMPARISON:  08/27/2020 FINDINGS: Left subclavian central venous catheter tip is again seen within the superior vena cava. Pulmonary insufflation is normal and symmetric. Perihilar pulmonary edema has improved in the interval since prior examination, now mild in severity. No pneumothorax or pleural effusion. Mild cardiomegaly is stable. No acute bone abnormality. IMPRESSION: Stable cardiomegaly. Improving pulmonary edema. Resolved right pleural effusion. Electronically Signed   By: Fidela Salisbury MD   On: 08/31/2020 08:20   CT ABDOMEN PELVIS W CONTRAST  Result Date: 08/30/2020 CLINICAL DATA:  Anemia, patient is not a colonoscopy candidate, assess for intestinal malignancy or other abnormality. EXAM: CT ABDOMEN AND PELVIS WITH CONTRAST TECHNIQUE: Multidetector CT imaging of the abdomen and pelvis was performed using the standard protocol following bolus administration of intravenous contrast. CONTRAST:  140m OMNIPAQUE IOHEXOL 300 MG/ML  SOLN COMPARISON:  None. FINDINGS: Lower chest: Moderate right, small left pleural effusions and associated atelectasis or consolidation. Cardiomegaly. Coronary artery calcifications. Hepatobiliary: No solid liver abnormality is seen. No gallstones,  gallbladder wall thickening, or biliary dilatation. Pancreas: Unremarkable. No pancreatic ductal dilatation or surrounding inflammatory changes. Spleen: Normal in size without significant abnormality. Adrenals/Urinary Tract: Adrenal glands  are unremarkable. Kidneys are normal, without renal calculi, solid lesion, or hydronephrosis. Foley catheter within the urinary bladder. Stomach/Bowel: Stomach is within normal limits. Appendix appears normal. No evidence of bowel wall thickening, distention, or inflammatory changes. Occasional sigmoid diverticula Vascular/Lymphatic: Aortic atherosclerosis. No enlarged abdominal or pelvic lymph nodes. Reproductive: Prostatomegaly. Other: No abdominal wall hernia or abnormality. No abdominopelvic ascites. Musculoskeletal: No acute or significant osseous findings. IMPRESSION: 1. No CT findings of the abdomen or pelvis to explain anemia. No mass or other abnormality of the bowel identified. 2. Occasional sigmoid diverticula without evidence of acute diverticulitis. 3. Prostatomegaly. 4. Moderate right, small left pleural effusions and associated atelectasis or consolidation. 5. Coronary artery disease. Aortic Atherosclerosis (ICD10-I70.0). Electronically Signed   By: Eddie Candle M.D.   On: 08/30/2020 14:24   CARDIAC CATHETERIZATION  Result Date: 09/01/2020  Prox RCA lesion is 99% stenosed.  Mid LAD to Dist LAD lesion is 40% stenosed.  Prox LAD to Mid LAD lesion is 30% stenosed.  Mid Cx lesion is 100% stenosed.  Dist Cx lesion is 90% stenosed.  Findings: Ao = 102/65 (80) LV =  93/25 RA = 14 RV = 52/15 PA = 53/19 (35) PCW = 28 Fick cardiac output/index = 6.0/2.8 PVR = 1.1 WU Ao sat = 93% PA sat = 51%, 54% Assessment: 1. 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 Elevated filling pressures with preserved CO Plan/Discussion: Medical therapy. Continue diuresis. Glori Bickers, MD 7:45 PM     Medications:      Scheduled Medications: . albuterol  2 puff Inhalation Q6H  . vitamin C  500 mg Oral Daily  . Chlorhexidine Gluconate Cloth  6 each Topical Daily  . dextromethorphan-guaiFENesin  1 tablet Oral BID  . enoxaparin (LOVENOX) injection  40 mg Subcutaneous Q24H  . fluticasone furoate-vilanterol  1 puff Inhalation Daily  . folic acid  1 mg Oral Daily  . furosemide  80 mg Intravenous BID  . insulin aspart  0-5 Units Subcutaneous QHS  . insulin aspart  0-9 Units Subcutaneous TID WC  . mouth rinse  15 mL Mouth Rinse BID  . pantoprazole (PROTONIX) IV  40 mg Intravenous Q12H  . potassium chloride  40 mEq Oral BID  . ramelteon  8 mg Oral QHS  . simvastatin  10 mg Oral q1800  . sodium chloride flush  10-40 mL Intracatheter Q12H  . sodium chloride flush  3 mL Intravenous Q12H  . spironolactone  25 mg Oral Daily  . sucralfate  1 g Oral QID  . thiamine  100 mg Oral Daily  . umeclidinium bromide  1 puff Inhalation Daily  . zinc sulfate  220 mg Oral Daily    Infusions: . sodium chloride 250 mL (08/29/20 1900)  . sodium chloride    . amiodarone 30 mg/hr (09/01/20 0600)  . doxycycline (VIBRAMYCIN) IV Stopped (09/01/20 0054)  . milrinone 0.125 mcg/kg/min (08/31/20 1400)  . norepinephrine (LEVOPHED) Adult infusion Stopped (08/30/20 1119)    PRN Medications: sodium chloride, acetaminophen, acetaminophen, chlorpheniramine-HYDROcodone, guaiFENesin-dextromethorphan, ondansetron (ZOFRAN) IV, sodium chloride flush, sodium chloride flush   Assessment/Plan:    1. 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 Elevated filling pressures with preserved CO (  on milrinone). PCWP 28  - On milrinone 0.125. Co-ox 74% - Continue IV Lasix 80 mg  bid - Continue spiro  25 mg daily.  - BP too soft for ARB/ARNi    2. Acute hypoxic respiratory failure in setting of COVID PNA /AECOPD - Unvacinated CXR shows RML/RLL opacity.  Requiring 10L of oxygen via [place].  - Treatment per CCM: Remdesivr, Dexamethesone 6 IV, neb and doxycycline.    3. Persistent AF with RVR - has h/o AF at home. Unclear if this is chronic or not. Given that he is on amio at home suspect not chronic  - Continue amio gtt at 60/hr  - off AC due to GIB - Will 1-2 more days for GI tract to heal and then start [facility] and if tolerates plan TEE/DC-CV next week.   4. GIB/Anemia -On eliquis for afib, presentation hgb 6.1, +FOBT; Ferritin 27, iron 13, folate 10.9.  S/p transfused 3 units of PRBCs -> hgb 8.7.  MCV 63. -S/P EGD 2/1 with gastritis. On protonix + carafate.  -Per GI--> not a candidate for Colonoscopy at this time. CT A/P without concerning lesions -Hgb 9.0 -Hold anticoagulation for now - continue PPI  5. AKI - Likely due to ATN/cardiorenal - Baseline Cr 1.15, peak Cr 2.59 - improved, SCr now <1.0   6. DM2 - per CCM   7. Hypovolemic Hyponatremic -Na2+ 131 -> 133 ->135 ->135->136-> 133->132 - continue diuresis     Length of Stay: 7  Brittainy Simmons PA-C  09/01/2020, 8:15 AM  Advanced Heart Failure Team Pager [phone] (M-F; 7a - 4p)  Please contact [facility] for night-coverage after hours (4p -7a ) and weekends on [email or web address]  Patient seen and examined with the above-signed Advanced Practice Provider and/or Housestaff. I personally reviewed laboratory data, imaging studies and relevant notes. I independently examined the patient and formulated the important aspects of the plan. I have edited the note to reflect any of my changes or salient points. I have personally discussed the plan with the patient and/or family.  Remains SOB. On milrinone 0.125. Co-ox looks good. Remains volume overloaded. Still in AF on IV amio. No further GI bleeding  General:   Ill appearing. On HFNC HEENT: normal Neck: supple. JVP to jaw . Carotids 2+ bilat; no bruits. No lymphadenopathy or thryomegaly appreciated. Cor: PMI nondisplaced. Irregular rate & rhythm. No rubs, gallops or murmurs. Lungs: + crackles  Abdomen: obese soft, nontender, nondistended. No hepatosplenomegaly. No bruits or masses. Good bowel sounds. Extremities: no cyanosis, clubbing, rash, 1+ edema Neuro: alert & orientedx3, cranial nerves grossly intact. moves all 4 extremities w/o difficulty. Affect pleasant  Cath results reviewed with him. Will manage CAD medically. Continue IV diuresis with milrinone support. Can likely wean milrinone off tomorrow. If hgb stable would start IV heparin on Sunday with no bolus and consider TEE/DC-CV early next week once respiratory status more stable.   Glori Bickers, MD  8:45 PM

## 2020-09-01 NOTE — Evaluation (Addendum)
Occupational Therapy Evaluation Patient Details Name: Perry Hunter MRN: [id number] DOB: [birth year] Today's Date: 09/01/2020    History of Present Illness 70 yo male with pmh dm2, GERD, tobacco abuse, copd presented to osh with worsening sob/doe and edema as well as black stool x1 month. Upon presentation pt was found to have hgb of 6, tachycardic, infiltrate on cxr (noted to be covid + as well). He was grossly volume up with hypotension and hemoccult positive. Echo was done showing LVEF 20-25% with grade 3 diastolic dysfunction and severe RH dysfunction   Clinical Impression   Patient admitted with the above diagnosis.  He was transferred to ICU yesterday 2/3, and remains there for now.  PTA he lives with his granddaughter, has a flight of stairs to negotiate to his second floor apartment, walked with a cane and was able to care for himself with respect to ADL and toileting.   Currently, due to deficits noted below, he is needing up to Mod A for ADL and basic mobility at RW level.  Attempted HEP in sitting, but patient became nauseous, nursing in the room.  OT provided wash cloth for basic grooming seated.  OT in the acute setting indicated to maximize his functional status prior to possibly returning home with [facility] services.      Follow Up Recommendations  Home health OT    Equipment Recommendations  3 in 1 bedside commode    Recommendations for Other Services       Precautions / Restrictions Precautions Precautions: Fall Precaution Comments: A line removed to R UE.      Mobility Bed Mobility Overal bed mobility: Needs Assistance Bed Mobility: Supine to Sit     Supine to sit: Mod assist          Transfers Overall transfer level: Needs assistance   Transfers: Sit to/from Stand;Stand Pivot Transfers Sit to Stand: Min assist Stand pivot transfers: Mod assist       General transfer comment: patient is very shakey with decreased balance.    Balance Overall balance  assessment: Needs assistance Sitting-balance support: Bilateral upper extremity supported;Feet supported Sitting balance-Leahy Scale: Fair     Standing balance support: Bilateral upper extremity supported Standing balance-Leahy Scale: Poor Standing balance comment: needs RW at this time                           ADL either performed or assessed with clinical judgement   ADL Overall ADL's : Needs assistance/impaired Eating/Feeding: Set up;Bed level   Grooming: Wash/dry hands;Wash/dry face;Minimal assistance;Sitting   Upper Body Bathing: Minimal assistance;Moderate assistance;Sitting Upper Body Bathing Details (indicate cue type and reason): thoroughness Lower Body Bathing: Moderate assistance;Sit to/from stand   Upper Body Dressing : Minimal assistance;Sitting   Lower Body Dressing: Maximal assistance;Sit to/from stand               Functional mobility during ADLs: Moderate assistance;Rolling walker       Vision Patient Visual Report: No change from baseline       Perception     Praxis      Pertinent Vitals/Pain Pain Assessment: Faces Faces Pain Scale: Hurts a little bit Pain Location: his bottom Pain Descriptors / Indicators: Aching Pain Intervention(s): Monitored during session     Hand Dominance Right   Extremity/Trunk Assessment Upper Extremity Assessment Upper Extremity Assessment: Generalized weakness   Lower Extremity Assessment Lower Extremity Assessment: Defer to PT evaluation   Cervical / Trunk Assessment Cervical /  Trunk Assessment: Kyphotic   Communication Communication Communication: HOH   Cognition Arousal/Alertness: Awake/alert Behavior During Therapy: Flat affect Overall Cognitive Status: following one step commands, disoriented to place and time.  Understands he has covid.                                   General Comments: doesn't say much.   General Comments       Exercises     Shoulder  Instructions      Home Living Family/patient expects to be discharged to:: Private residence Living Arrangements: Spouse/significant other;Children Available Help at Discharge: Family;Available 24 hours/day Type of Home: Apartment Home Access: Stairs to enter [employer] of Steps: flight with railing on the R Entrance Stairs-Rails: Right Home Layout: One level     Bathroom Shower/Tub: Occupational psychologist: Standard     Home Equipment: Shower seat;Grab bars - tub/shower;Cane - single point          Prior Functioning/Environment Level of Independence: Independent with assistive device(s)        Comments: Has a shower seat for bathing, generally sits on bed to dress.  Uses a cane for mobility.        OT Problem List: Decreased strength;Decreased activity tolerance;Impaired balance (sitting and/or standing);Decreased safety awareness;Pain      OT Treatment/Interventions: Self-care/ADL training;Therapeutic exercise;DME and/or AE instruction;Balance training;Therapeutic activities    OT Goals(Current goals can be found in the care plan section) Acute Rehab OT Goals Patient Stated Goal: Wanting to lie down OT Goal Formulation: With patient Time For Goal Achievement: 09/15/20 Potential to Achieve Goals: Fair ADL Goals Pt Will Perform Grooming: with set-up;sitting Pt Will Perform Upper Body Bathing: with supervision;sitting;standing Pt Will Perform Upper Body Dressing: with set-up;sitting;standing Pt Will Transfer to Toilet: with supervision;ambulating;regular height toilet Pt Will Perform Toileting - Clothing Manipulation and hygiene: with supervision;sit to/from stand  OT Frequency: Min 2X/week   Barriers to D/C:    none noted       Co-evaluation              AM-PAC OT "6 Clicks" Daily Activity     Outcome Measure Help from another person eating meals?: None Help from another person taking care of personal grooming?: A Little Help  from another person toileting, which includes using toliet, bedpan, or urinal?: A Lot Help from another person bathing (including washing, rinsing, drying)?: A Lot Help from another person to put on and taking off regular upper body clothing?: A Little Help from another person to put on and taking off regular lower body clothing?: A Lot 6 Click Score: 16   End of Session Equipment Utilized During Treatment: Gait belt;Rolling walker;Oxygen Nurse Communication: Mobility status  Activity Tolerance: Patient limited by fatigue Patient left: in chair;with call bell/phone within reach;with chair alarm set  OT Visit Diagnosis: Unsteadiness on feet (R26.81);Muscle weakness (generalized) (M62.81);Pain                Time: [id number] OT Time Calculation (min): 33 min Charges:  OT General Charges $OT Visit: 1 Visit OT Evaluation $OT Eval Moderate Complexity: 1 Mod OT Treatments $Self Care/Home Management : 8-22 mins  09/01/2020  Rich, OTR/L  Acute Rehabilitation Services  Office:  [place] 09/01/2020, 10:41 AM

## 2020-09-01 NOTE — Progress Notes (Signed)
Physical Therapy Treatment Patient Details Name: Perry Hunter MRN: [id number] DOB: June 06, 1951 Today's Date: 09/01/2020    History of Present Illness 70 yo male with pmh dm2, GERD, tobacco abuse, copd presented to osh with worsening sob/doe and edema as well as black stool x1 month. Upon presentation pt was found to have hgb of 6, tachycardic, infiltrate on cxr (noted to be covid + as well). He was grossly volume up with hypotension and hemoccult positive. Echo was done showing LVEF 20-25% with grade 3 diastolic dysfunction and severe RH dysfunction    PT Comments    Pt admitted with above diagnosis. Pt was able to take a few pivotal steps to the bed from the recliner with mod assist and cues.  Pt limited due to coughing up phlegm and dry heaving. Nurse made aware that pt treatment limited as he was only willing to go back to bed. Did place bed in chair position and pt still coughing up phlegm on departure.  Pt currently with functional limitations due to balance and endurance deficits. Pt will benefit from skilled PT to increase their independence and safety with mobility to allow discharge to the venue listed below.     Follow Up Recommendations  Home health PT;Supervision for mobility/OOB     Equipment Recommendations  Rolling walker with 5" wheels;3in1 (PT)    Recommendations for Other Services       Precautions / Restrictions Precautions Precautions: Fall Restrictions Weight Bearing Restrictions: No    Mobility  Bed Mobility Overal bed mobility: Needs Assistance Bed Mobility: Sit to Supine       Sit to supine: Min assist   General bed mobility comments: Needed min assist to get his LEs back into bed.  Transfers Overall transfer level: Needs assistance Equipment used: Rolling walker (2 wheeled) Transfers: Sit to/from [facility] Sit to Stand: Min assist Stand pivot transfers: Mod assist       General transfer comment: Pt began coughing shortly after  PT came in room and was even dry heaving. He was in chair and asked to go back to bed.  Patient was very shaky with decreased balance upon standing to take a few steps to bed.  Needed mod assist for safety using RW to pivot back to bed.  Ambulation/Gait                 Stairs             Wheelchair Mobility    Modified Rankin (Stroke Patients Only)       Balance Overall balance assessment: Needs assistance Sitting-balance support: Feet supported;No upper extremity supported Sitting balance-Leahy Scale: Fair     Standing balance support: Bilateral upper extremity supported Standing balance-Leahy Scale: Poor Standing balance comment: needs RW at this time                            Cognition Arousal/Alertness: Awake/alert Behavior During Therapy: Flat affect Overall Cognitive Status: Within Functional Limits for tasks assessed                                        Exercises      General Comments General comments (skin integrity, edema, etc.): VSS with pt on 6LO2 with O2 94% at rest and 87% with activity with 6L.      Pertinent Vitals/Pain Pain Assessment: No/denies  pain    Home Living                      Prior Function            PT Goals (current goals can now be found in the care plan section) Acute Rehab PT Goals Patient Stated Goal: Wanting to lie down Progress towards PT goals: Progressing toward goals    Frequency    Min 3X/week      PT Plan Current plan remains appropriate    Co-evaluation              AM-PAC PT "6 Clicks" Mobility   Outcome Measure  Help needed turning from your back to your side while in a flat bed without using bedrails?: A Little Help needed moving from lying on your back to sitting on the side of a flat bed without using bedrails?: A Little Help needed moving to and from a bed to a chair (including a wheelchair)?: A Lot Help needed standing up from a chair using  your arms (e.g., wheelchair or bedside chair)?: A Lot Help needed to walk in hospital room?: A Lot Help needed climbing 3-5 steps with a railing? : Total 6 Click Score: 13    End of Session Equipment Utilized During Treatment: Gait belt;Oxygen Activity Tolerance: Patient limited by fatigue (limited by nausea and dry heaving.) Patient left: in bed;with call bell/phone within reach;with nursing/sitter in room;with bed alarm set Nurse Communication: Mobility status PT Visit Diagnosis: Unsteadiness on feet (R26.81);Muscle weakness (generalized) (M62.81);Other abnormalities of gait and mobility (R26.89)     Time: 1110-1141 PT Time Calculation (min) (ACUTE ONLY): 31 min  Charges:  $Gait Training: 8-22 mins $Therapeutic Activity: 8-22 mins                     Kindel Rochefort W,PT Acute Rehabilitation Services Pager:  [phone]  Office:  [place] 09/01/2020, 4:40 PM

## 2020-09-02 ENCOUNTER — Encounter: Payer: Self-pay | Admitting: Gastroenterology

## 2020-09-02 DIAGNOSIS — I4819 Other persistent atrial fibrillation: Secondary | ICD-10-CM | POA: Diagnosis not present

## 2020-09-02 DIAGNOSIS — R570 Cardiogenic shock: Secondary | ICD-10-CM | POA: Diagnosis not present

## 2020-09-02 DIAGNOSIS — I5043 Acute on chronic combined systolic (congestive) and diastolic (congestive) heart failure: Secondary | ICD-10-CM

## 2020-09-02 DIAGNOSIS — I5023 Acute on chronic systolic (congestive) heart failure: Secondary | ICD-10-CM

## 2020-09-02 LAB — COOXEMETRY PANEL
Carboxyhemoglobin: 1.7 % — ABNORMAL HIGH (ref 0.5–1.5)
Methemoglobin: 0.7 % (ref 0.0–1.5)
O2 Saturation: 77.8 %
Total hemoglobin: 9.6 g/dL — ABNORMAL LOW (ref 12.0–16.0)

## 2020-09-02 LAB — GLUCOSE, CAPILLARY
Glucose-Capillary: 125 mg/dL — ABNORMAL HIGH (ref 70–99)
Glucose-Capillary: 147 mg/dL — ABNORMAL HIGH (ref 70–99)

## 2020-09-02 LAB — HEPARIN LEVEL (UNFRACTIONATED): Heparin Unfractionated: <0.1 IU/mL — ABNORMAL LOW (ref 0.30–0.70)

## 2020-09-02 LAB — BASIC METABOLIC PANEL
Anion gap: 11 (ref 5–15)
BUN: 20 mg/dL (ref 8–23)
CO2: 41 mmol/L — ABNORMAL HIGH (ref 22–32)
Calcium: 8.4 mg/dL — ABNORMAL LOW (ref 8.9–10.3)
Chloride: 81 mmol/L — ABNORMAL LOW (ref 98–111)
Creatinine, Ser: 0.93 mg/dL (ref 0.61–1.24)
GFR, Estimated: >60 mL/min (ref >60)
Glucose, Bld: 109 mg/dL — ABNORMAL HIGH (ref 70–99)
Potassium: 4.3 mmol/L (ref 3.5–5.1)
Sodium: 133 mmol/L — ABNORMAL LOW (ref 135–145)

## 2020-09-02 LAB — CBC
HCT: 34.5 % — ABNORMAL LOW (ref 39.0–52.0)
Hemoglobin: 9.3 g/dL — ABNORMAL LOW (ref 13.0–17.0)
MCH: 18.2 pg — ABNORMAL LOW (ref 26.0–34.0)
MCHC: 27 g/dL — ABNORMAL LOW (ref 30.0–36.0)
MCV: 67.5 fL — ABNORMAL LOW (ref 80.0–100.0)
Platelets: 228 10*3/uL (ref 150–400)
RBC: 5.11 MIL/uL (ref 4.22–5.81)
RDW: 28.3 % — ABNORMAL HIGH (ref 11.5–15.5)
WBC: 11.7 10*3/uL — ABNORMAL HIGH (ref 4.0–10.5)
nRBC: 1.5 % — ABNORMAL HIGH (ref 0.0–0.2)

## 2020-09-02 MED ORDER — FUROSEMIDE 10 MG/ML IJ SOLN
80.0000 mg | Freq: Two times a day (BID) | INTRAMUSCULAR | Status: AC
  Administered 2020-09-02: 80 mg via INTRAVENOUS
  Filled 2020-09-02: qty 8

## 2020-09-02 MED ORDER — HEPARIN (PORCINE) 25000 UT/250ML-% IV SOLN
1550.0000 [IU]/h | INTRAVENOUS | Status: DC
  Administered 2020-09-02: 1150 [IU]/h via INTRAVENOUS
  Filled 2020-09-02 (×2): qty 250

## 2020-09-02 MED ORDER — DIGOXIN 125 MCG PO TABS
0.1250 mg | ORAL_TABLET | Freq: Every day | ORAL | Status: DC
  Administered 2020-09-02 – 2020-09-07 (×6): 0.125 mg via ORAL
  Filled 2020-09-02 (×6): qty 1

## 2020-09-02 MED ORDER — ACETAZOLAMIDE 250 MG PO TABS
500.0000 mg | ORAL_TABLET | Freq: Two times a day (BID) | ORAL | Status: DC
  Administered 2020-09-02 (×2): 500 mg via ORAL
  Filled 2020-09-02 (×3): qty 2

## 2020-09-02 MED ORDER — MAGNESIUM SULFATE 2 GM/50ML IV SOLN
2.0000 g | Freq: Once | INTRAVENOUS | Status: AC
  Administered 2020-09-02: 2 g via INTRAVENOUS
  Filled 2020-09-02: qty 50

## 2020-09-02 NOTE — Progress Notes (Signed)
ANTICOAGULATION CONSULT NOTE - Initial Consult  Pharmacy Consult for heparin Indication: atrial fibrillation  No Known Allergies  Patient Measurements: Height: '5\' 8"'$  (172.7 cm) Weight: 103.4 kg (227 lb 15.3 oz) IBW/kg (Calculated) : 68.4 Heparin Dosing Weight: 94kg  Vital Signs: Temp: 97.8 F (36.6 C) (02/05 0621) Temp Source: Oral (02/05 0621) BP: 120/81 (02/05 0600) Pulse Rate: 119 (02/05 0600)  Labs: Recent Labs    08/31/20 2125 09/01/20 0650 09/02/20 0247  HGB 9.1* 9.0* 9.3*  HCT 33.7* 32.4* 34.5*  PLT 208 211 228  CREATININE 1.04 0.97 0.93    Estimated Creatinine Clearance: 84.9 mL/min (by C-G formula based on SCr of 0.93 mg/dL).   Medical History: Past Medical History:  Diagnosis Date  . Arthritis   . Asthma   . CHF (congestive heart failure) ([place])   . Diabetes mellitus without complication ([place])   . GERD (gastroesophageal reflux disease)   . Hypercholesteremia   . Hypertension     Medications:  Scheduled:  . acetaZOLAMIDE  500 mg Oral BID  . albuterol  2 puff Inhalation Q6H  . vitamin C  500 mg Oral Daily  . Chlorhexidine Gluconate Cloth  6 each Topical Daily  . dextromethorphan-guaiFENesin  1 tablet Oral BID  . feeding supplement  237 mL Oral TID BM  . fluticasone furoate-vilanterol  1 puff Inhalation Daily  . folic acid  1 mg Oral Daily  . furosemide  80 mg Intravenous BID  . insulin aspart  0-5 Units Subcutaneous QHS  . insulin aspart  0-9 Units Subcutaneous TID WC  . mouth rinse  15 mL Mouth Rinse BID  . multivitamin with minerals  1 tablet Oral Daily  . pantoprazole (PROTONIX) IV  40 mg Intravenous Q12H  . potassium chloride  40 mEq Oral BID  . ramelteon  8 mg Oral QHS  . rosuvastatin  20 mg Oral Daily  . sodium chloride flush  10-40 mL Intracatheter Q12H  . sodium chloride flush  3 mL Intravenous Q12H  . spironolactone  25 mg Oral Daily  . sucralfate  1 g Oral QID  . thiamine  100 mg Oral Daily  . umeclidinium bromide  1 puff  Inhalation Daily  . zinc sulfate  220 mg Oral Daily    Assessment: 70 yo male with HF, COVID+ and afib. He is on apixban PTA for afib and this has been on hold for GIB (upper endoscopy with gastritis). Pharmacy consulted to dose heparin -hg= 9.3, plt= 228  Goal of Therapy:  Heparin level 0.3-0.5 units/ml Monitor platelets by anticoagulation protocol: Yes   Plan:   -No heparin bolus -start heparin at 1150 units/hr -Heparin level in 8 hours and daily wth CBC daily  Hildred Laser, PharmD Clinical Pharmacist **Pharmacist phone directory can now be found on [email or web address] ([email or web address]).  Listed under [place].

## 2020-09-02 NOTE — Progress Notes (Signed)
[place] for heparin Indication: atrial fibrillation  No Known Allergies  Patient Measurements: Height: '5\' 8"'$  (172.7 cm) Weight: 103.4 kg (227 lb 15.3 oz) IBW/kg (Calculated) : 68.4 Heparin Dosing Weight: 94kg  Vital Signs: Temp: 99.3 F (37.4 C) (02/05 1600) Temp Source: Oral (02/05 1600) BP: 100/70 (02/05 1700) Pulse Rate: 106 (02/05 1700)  Labs: Recent Labs    08/31/20 2125 09/01/20 0650 09/02/20 0247 09/02/20 1638  HGB 9.1* 9.0* 9.3*  --   HCT 33.7* 32.4* 34.5*  --   PLT 208 211 228  --   HEPARINUNFRC  --   --   --  <0.10*  CREATININE 1.04 0.97 0.93  --     Estimated Creatinine Clearance: 84.9 mL/min (by C-G formula based on SCr of 0.93 mg/dL).   Medical History: Past Medical History:  Diagnosis Date  . Arthritis   . Asthma   . CHF (congestive heart failure) ([place])   . Diabetes mellitus without complication ([place])   . GERD (gastroesophageal reflux disease)   . Hypercholesteremia   . Hypertension     Medications:  Scheduled:  . acetaZOLAMIDE  500 mg Oral BID  . albuterol  2 puff Inhalation Q6H  . vitamin C  500 mg Oral Daily  . Chlorhexidine Gluconate Cloth  6 each Topical Daily  . dextromethorphan-guaiFENesin  1 tablet Oral BID  . digoxin  0.125 mg Oral Daily  . feeding supplement  237 mL Oral TID BM  . fluticasone furoate-vilanterol  1 puff Inhalation Daily  . folic acid  1 mg Oral Daily  . insulin aspart  0-5 Units Subcutaneous QHS  . insulin aspart  0-9 Units Subcutaneous TID WC  . mouth rinse  15 mL Mouth Rinse BID  . multivitamin with minerals  1 tablet Oral Daily  . pantoprazole (PROTONIX) IV  40 mg Intravenous Q12H  . potassium chloride  40 mEq Oral BID  . ramelteon  8 mg Oral QHS  . rosuvastatin  20 mg Oral Daily  . sodium chloride flush  10-40 mL Intracatheter Q12H  . sodium chloride flush  3 mL Intravenous Q12H  . spironolactone  25 mg Oral Daily  . sucralfate  1 g Oral QID  . thiamine  100 mg  Oral Daily  . umeclidinium bromide  1 puff Inhalation Daily  . zinc sulfate  220 mg Oral Daily    Assessment: 70 yo male with HF, COVID+ and afib. He is on apixban PTA for afib and this has been on hold for GIB (upper endoscopy with gastritis). Pharmacy consulted to dose heparin -hg= 9.3, plt= 228  Initial heparin level undetectable. No bleeding issues noted.   Goal of Therapy:  Heparin level 0.3-0.5 units/ml Monitor platelets by anticoagulation protocol: Yes   Plan:   -No heparin bolus -Increase heparin to 1350 units/hr -Heparin level in 8 hours and daily wth CBC daily  Erin Hearing PharmD., BCPS Clinical Pharmacist 09/02/2020 6:39 PM

## 2020-09-02 NOTE — Progress Notes (Signed)
Patient ID: Perry Hunter, male   DOB: 1951/02/01, 70 y.o.   MRN: [id number]    Advanced Heart Failure Rounding Note   Subjective:    08/29/20 S/P EGD - gastritis. Recommendations for carafate +IV protonix.    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 Elevated filling pressures with preserved CO.  Remains on milrinone 0.125. Co-ox 78%  Weight down on IV Lasix.  Creatinine stable 0.93.  CVP 8.   Afib low 100s on amio gtt at 60/hr.   He remains on 5L [place].    R/LHC   Prox RCA lesion is 99% stenosed.  Mid LAD to Dist LAD lesion is 40% stenosed.  Prox LAD to Mid LAD lesion is 30% stenosed.  Mid Cx lesion is 100% stenosed.  Dist Cx lesion is 90% stenosed.   Findings:  Ao = 102/65 (80)  LV =  93/25 RA = 14 RV = 52/15 PA = 53/19 (35) PCW = 28 Fick cardiac output/index = 6.0/2.8 PVR = 1.1 WU Ao sat = 93% PA sat = 51%, 54%  Objective:   Weight Range:  Vital Signs:   Temp:  [97.8 F (36.6 C)-97.9 F (36.6 C)] 97.8 F (36.6 C) (02/05 0621) Pulse Rate:  [67-119] 119 (02/05 0600) Resp:  [12-20] 19 (02/05 0600) BP: (97-121)/(36-83) 120/81 (02/05 0600) SpO2:  [92 %-100 %] 100 % (02/05 0600) Arterial Line BP: (79)/(72) 79/72 (02/04 1000) Weight:  [103.4 kg] 103.4 kg (02/05 0500) Last BM Date: 09/01/20  Weight change: Filed Weights   08/30/20 1220 09/01/20 0500 09/02/20 0500  Weight: 105.8 kg 105 kg 103.4 kg    Intake/Output:   Intake/Output Summary (Last 24 hours) at 09/02/2020 0904 Last data filed at 09/02/2020 0600 Gross per 24 hour  Intake 1624.56 ml  Output 2201 ml  Net -576.44 ml    PHYSICAL EXAM: General: NAD Neck: JVP 8 cm, no thyromegaly or thyroid nodule.  Lungs: Rhonchi CV: Nondisplaced PMI.  Heart mildly tachy, irregular S1/S2, no S3/S4, no murmur.  No peripheral edema.   Abdomen: Soft, nontender, no hepatosplenomegaly, no distention.  Skin: Intact without lesions or rashes.   Neurologic: Alert and oriented x 3.  Psych: Normal affect. Extremities: No clubbing or cyanosis.  HEENT: Normal.   Telemetry:  A fib low 100s    Labs: Basic Metabolic Panel: Recent Labs  Lab 08/26/20 0934 08/26/20 2323 08/27/20 0344 08/27/20 1315 08/28/20 0313 08/29/20 0329 08/30/20 0453 08/31/20 0531 08/31/20 1622 08/31/20 1627 08/31/20 2125 09/01/20 0650 09/02/20 0247  NA 131*   < > 132* 133* 135 135 136 133* 141 132*  134*  --  132* 133*  K 5.5*   < > 5.4* 4.7 4.2 3.9 3.5 3.4* 3.4* 4.3  4.1  --  4.4 4.3  CL 97*   < > 96* 92* 87* 84* 82* 80*  --   --   --  82* 81*  CO2 25   < > 25 28 33* 37* 44* 43*  --   --   --  41* 41*  GLUCOSE 131*   < > 135* 120* 140* 152* 122* 106*  --   --   --  130* 109*  BUN 44*   < > 43* 42* 38* 30* 24* 22  --   --   --  21 20  CREATININE 2.59*   < > 2.01* 1.90* 1.61* 1.31* 1.14 1.01  --   --  1.04 0.97  0.93  CALCIUM 8.0*   < > 8.2* 8.3* 8.5* 8.2* 8.4* 8.0*  --   --   --  8.2* 8.4*  MG 2.1  --  2.0 2.0 1.7 1.9 2.1 1.7  --   --   --   --   --   PHOS 5.6*  --  5.0*  --  4.1 2.7 3.0  --   --   --   --   --   --    < > = values in this interval not displayed.    Liver Function Tests: Recent Labs  Lab 08/26/20 2323 08/27/20 0344 08/28/20 0313 08/29/20 0329 08/30/20 0453  AST 95* 85* 54* 40 35  ALT 84* 77* 60* 51* 44  ALKPHOS 71 67 69 64 72  BILITOT 1.0 1.2 1.4* 1.8* 1.8*  PROT 6.6 6.6 6.9 6.5 6.7  ALBUMIN 3.2* 3.2* 3.2* 3.1* 3.1*   No results for input(s): LIPASE, AMYLASE in the last 168 hours. No results for input(s): AMMONIA in the last 168 hours.  CBC: Recent Labs  Lab 08/26/20 2323 08/27/20 0344 08/27/20 1315 08/28/20 0313 08/29/20 0329 08/30/20 0453 08/31/20 0531 08/31/20 1622 08/31/20 1627 08/31/20 2125 09/01/20 0650 09/02/20 0247  WBC 4.0 6.4   < > 10.3 12.2* 11.2* 9.5  --   --  9.9 12.5* 11.7*  NEUTROABS 3.2 5.7  --  9.4* 11.1* 8.5*  --   --   --   --   --   --   HGB 8.7* 8.6*   < > 8.9* 9.1* 9.0* 8.9* 10.5*  12.9*  12.2* 9.1* 9.0* 9.3*  HCT 30.8* 28.8*   < > 31.3* 31.0* 33.9* 33.3* 31.0* 38.0*  36.0* 33.7* 32.4* 34.5*  MCV 63.4* 63.0*   < > 63.4* 62.9* 66.5* 67.1*  --   --  66.3* 66.4* 67.5*  PLT 191 186   < > 203 200 189 207  --   --  208 211 228   < > = values in this interval not displayed.    Cardiac Enzymes: No results for input(s): CKTOTAL, CKMB, CKMBINDEX, TROPONINI in the last 168 hours.  BNP: BNP (last 3 results) Recent Labs    08/25/20 1452  BNP 527.0*    ProBNP (last 3 results) No results for input(s): PROBNP in the last 8760 hours.    Other results:  Imaging: CARDIAC CATHETERIZATION  Result Date: 09/01/2020  Prox RCA lesion is 99% stenosed.  Mid LAD to Dist LAD lesion is 40% stenosed.  Prox LAD to Mid LAD lesion is 30% stenosed.  Mid Cx lesion is 100% stenosed.  Dist Cx lesion is 90% stenosed.  Findings: Ao = 102/65 (80) LV =  93/25 RA = 14 RV = 52/15 PA = 53/19 (35) PCW = 28 Fick cardiac output/index = 6.0/2.8 PVR = 1.1 WU Ao sat = 93% PA sat = 51%, 54% Assessment: 1. 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 Elevated filling pressures with preserved CO Plan/Discussion: Medical therapy. Continue diuresis. Glori Bickers, MD 7:45 PM     Medications:     Scheduled Medications: . acetaZOLAMIDE  500 mg Oral BID  . albuterol  2 puff Inhalation Q6H  . vitamin C  500 mg Oral Daily  . Chlorhexidine Gluconate Cloth  6 each Topical Daily  . dextromethorphan-guaiFENesin  1 tablet Oral BID  . feeding supplement  237 mL Oral TID BM  . fluticasone furoate-vilanterol  1  puff Inhalation Daily  . folic acid  1 mg Oral Daily  . furosemide  80 mg Intravenous BID  . insulin aspart  0-5 Units Subcutaneous QHS  . insulin aspart  0-9 Units Subcutaneous TID WC  . mouth rinse  15 mL Mouth Rinse BID  . multivitamin with minerals  1 tablet Oral Daily  . pantoprazole (PROTONIX) IV  40 mg Intravenous Q12H  .  potassium chloride  40 mEq Oral BID  . ramelteon  8 mg Oral QHS  . rosuvastatin  20 mg Oral Daily  . sodium chloride flush  10-40 mL Intracatheter Q12H  . sodium chloride flush  3 mL Intravenous Q12H  . spironolactone  25 mg Oral Daily  . sucralfate  1 g Oral QID  . thiamine  100 mg Oral Daily  . umeclidinium bromide  1 puff Inhalation Daily  . zinc sulfate  220 mg Oral Daily    Infusions: . sodium chloride 10 mL/hr at 09/02/20 0600  . sodium chloride    . amiodarone 60 mg/hr (09/02/20 0600)  . heparin    . magnesium sulfate bolus IVPB      PRN Medications: sodium chloride, acetaminophen, chlorpheniramine-HYDROcodone, guaiFENesin-dextromethorphan, ondansetron (ZOFRAN) IV, sodium chloride flush, sodium chloride flush   Assessment/Plan:    1. 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 Elevated filling pressures with preserved CO (on milrinone).   - Plan for medical management of CAD.  - On milrinone 0.125. Co-ox 78%. Stop milrinone today.  - CVP 8.  Will give Lasix 80 mg IV x 1 this morning then stop.  He is getting acetazolamide today with HCO3 elevated.  Probably to po torsemide tomorrow.  - Continue spiro 25 mg daily.  - BP too soft for ARB/ARNi  - Will add digoxin 0.125 daily.   2. Acute hypoxic respiratory failure in setting of COVID PNA /AECOPD - Unvacinated CXR shows RML/RLL opacity.  Requiring 5L of oxygen via [place].  - Treatment per CCM: has had Remdesivr, off steroids now.  Getting doxycycline.    3. Persistent AF with RVR - has h/o AF at home. Unclear if this is chronic or not. Given that he is on amio at home suspect not chronic  - Continue amio gtt at 60/hr  - off AC due  to GIB => starting heparin gtt today, follow for recurrent bleeding.  - If he tolerates anticoagulation, plan TEE/DC-CV next week.   4. GIB/Anemia - On eliquis for afib, presentation hgb 6.1, +FOBT; Ferritin 27, iron 13, folate 10.9.  S/p transfused 3 units of PRBCs -> hgb 8.7.  MCV 63. - S/P EGD 2/1 with gastritis. On protonix + carafate.  - Per GI--> not a candidate for colonoscopy at this time. CT A/P without concerning lesions - Hgb 9.3 - Starting heparin gtt today, follow for rebleeding.   5. AKI - Likely due to ATN/cardiorenal - Baseline Cr 1.15, peak Cr 2.59 - improved, SCr now <1.0   6. DM2 - per CCM   7. Hypovolemic Hyponatremic - Mild.  - continue diuresis    Length of Stay: 8  Loralie Champagne, MD  9:04 AM

## 2020-09-02 NOTE — Plan of Care (Signed)

## 2020-09-02 NOTE — Plan of Care (Signed)
  Problem: Clinical Measurements: Goal: Cardiovascular complication will be avoided Outcome: Progressing   Problem: Coping: Goal: Level of anxiety will decrease Outcome: Progressing   Problem: Elimination: Goal: Will not experience complications related to bowel motility Outcome: Progressing   Problem: Elimination: Goal: Will not experience complications related to urinary retention Outcome: Progressing   Problem: Pain Managment: Goal: General experience of comfort will improve Outcome: Progressing   Problem: Skin Integrity: Goal: Risk for impaired skin integrity will decrease Outcome: Progressing

## 2020-09-02 NOTE — Progress Notes (Signed)
NAME:  Perry Hunter, MRN:  [id number], DOB:  [birth year], LOS: 8 ADMISSION DATE:  08/25/2020, CONSULTATION DATE:  08/26/20 REFERRING MD:  Dr Manuella Ghazi, CHIEF COMPLAINT:  Chf, gib  Brief History   70 yo male with pmh dm2, GERD, tobacco abuse, copd presented to osh with worsening sob/doe and edema as well as black stool x1 month.   History of present illness   70 yo male with pmh HTN, Hyperlipidemia, T2DM, GERD, tobacco abuse, copd, paf on eliquis who presented to osh at the insistence of his family for weeks of increasing abdominal girth/le edema, sob/doe and black tarry stools for 1 month.   Upon presentation pt was found to have hgb of 6, tachycardic, infiltrate on cxr (noted to be covid + as well). He was grossly volume up with hypotension and hemoccult positive.   Pt was started on neo and uptitrated to >186mg, subsequently transitioned to dopamine after conversation with cardiology by Dr SManuella Ghazi Echo was done showing LVEF 20-25% with grade 3 diastolic dysfunction and severe RH dysfunction with RVSP 44.467mg  GI was consulted for his +hemoccult and recommended endoscopy when pt stabilized. At this time he has received 3u prbc.   Pt arrived to our ICU was alert and oriented x2.  On 5 mcg of dopamine.  Satting in 100s on 4 L.  Denied any overt shortness of breath.  Denied any GI symptoms did endorse melena previously consent was obtained from patient and wife for central line and A-line.   Past Medical History   Past Medical History:  Diagnosis Date  . Arthritis   . Asthma   . [place] heart failure) [place]  . [place] without complication ([place]  . GERD (gastroesophageal reflux disease)   . Hypercholesteremia   . Hypertension    Also with history of A. fib.  [facility]   [facility]  Procedures:  Central line A-line  Significant Diagnostic Tests:  1/29 echo: LVEF 20-25% grade  III diastolic dysfunction, rh severely dysfunctional.  1/30 abdominal Us>> nothing remarkable 08/31/20 RHC Ao = 102/65 (80)  LV =  93/25 RA = 14 RV = 52/15 PA = 53/19 (35) PCW = 28 Fick cardiac output/index = 6.0/2.8 PVR = 1.1 WU Ao sat = 93% PA sat = 51%, 54%  LHC  Prox RCA lesion is 99% stenosed.  Mid LAD to Dist LAD lesion is 40% stenosed.  Prox LAD to Mid LAD lesion is 30% stenosed.  Mid Cx lesion is 100% stenosed.  Dist Cx lesion is 90% stenosed.  Micro Data:  1/28 sars2: positive  Antimicrobials:  N/A  Interim history/subjective:  No events. O2 needs improving Remains in Afib  Objective   Blood pressure 120/81, pulse (!) 119, temperature 97.8 F (36.6 C), temperature source Oral, resp. rate 19, height '5\' 8"'$  (1.727 m), weight 103.4 kg, SpO2 100 %. CVP:  [6 mmHg-9 mmHg] 9 mmHg      Intake/Output Summary (Last 24 hours) at 09/02/2020 0732 Last data filed at 09/02/2020 0600 Gross per 24 hour  Intake 1624.56 ml  Output 2201 ml  Net -576.44 ml   Filed Weights   08/30/20 1220 09/01/20 0500 09/02/20 0500  Weight: 105.8 kg 105 kg 103.4 kg    Examination: Constitutional: no acute distress, chronically ill appearing sitting in chair eating breakfast Eyes: eomi, pupils equal Ears, nose, mouth, and throat: MMM, trachea midline Cardiovascular: irregular, ext warm  Respiratory: lungs clear Gastrointestinal: Soft, +BS Skin: No rashes, normal turgor Neurologic: moves all 4 ext but pretty deconditioned Psychiatric: RASS 0  Net -576 Co-ox 78%  Resolved Hospital Problem list   N/A  Assessment & Plan:   Acute hypoxic respiratory failure due to COVID-19 infection- incidental, pulmonary edema primary driver; usual airborne isolation period. S/p 3 days remdesivir ppx COPD- thought was in flare but probably just fluid and now near baseline resp status Tobacco use disorder  Overall improving deaily -Wean O2 for sats > 88% -IS encouraged again, up to  chair -Incruse/breo -s/p doxycycline x 5 days -Push mobility, appreciate PT input, rec'd for [facility]  Acute HFrEF Cardiogenic Shock- CI good with milrinone Cardiorenal syndrome - Inotropes and diuresis per CHF team, will add diamox to reduce contraction alkalosis - Start AC challenge for potential cardioversion next week  Afib on eliquis-  stable - Amio gtt, start heparin challenge - Eventual TEE and cardioversion  Subacute GIB- looks to be due to severe esophagitis, CT A/P benign - Continue PPI - AC challenge starting today  Hematologic abnormalities: would not worry about in setting of COVID, acute cardiorenal syndrome.  Can recheck diff as OP.  DM2- well controlled on SSI  ICU delirium- improved off steroids, continue ramelteon, encourage day/night cycles  Best practice:  Diet: advance as tolerated Pain/Anxiety/Delirium protocol (if indicated): not indicated VAP protocol (if indicated): n/a DVT prophylaxis: with GIB, SCD for now GI prophylaxis: PPI BID Glucose control: as above Mobility: up to chair Code Status: Full Family Communication: updated patient Disposition: per primary   Erskine Emery MD [facility] 08/28/2020 7:23 AM Prefer epic messenger for cross cover needs If after hours, please call E-link

## 2020-09-03 DIAGNOSIS — I4819 Other persistent atrial fibrillation: Secondary | ICD-10-CM | POA: Diagnosis not present

## 2020-09-03 DIAGNOSIS — I5043 Acute on chronic combined systolic (congestive) and diastolic (congestive) heart failure: Secondary | ICD-10-CM | POA: Diagnosis not present

## 2020-09-03 DIAGNOSIS — R570 Cardiogenic shock: Secondary | ICD-10-CM | POA: Diagnosis not present

## 2020-09-03 LAB — COOXEMETRY PANEL
Carboxyhemoglobin: 1.7 % — ABNORMAL HIGH (ref 0.5–1.5)
Methemoglobin: 1 % (ref 0.0–1.5)
O2 Saturation: 69.1 %
Total hemoglobin: 10.1 g/dL — ABNORMAL LOW (ref 12.0–16.0)

## 2020-09-03 LAB — GLUCOSE, CAPILLARY
Glucose-Capillary: 157 mg/dL — ABNORMAL HIGH (ref 70–99)
Glucose-Capillary: 126 mg/dL — ABNORMAL HIGH (ref 70–99)
Glucose-Capillary: 139 mg/dL — ABNORMAL HIGH (ref 70–99)
Glucose-Capillary: 115 mg/dL — ABNORMAL HIGH (ref 70–99)
Glucose-Capillary: 116 mg/dL — ABNORMAL HIGH (ref 70–99)
Glucose-Capillary: 112 mg/dL — ABNORMAL HIGH (ref 70–99)
Glucose-Capillary: 131 mg/dL — ABNORMAL HIGH (ref 70–99)
Glucose-Capillary: 127 mg/dL — ABNORMAL HIGH (ref 70–99)
Glucose-Capillary: 102 mg/dL — ABNORMAL HIGH (ref 70–99)

## 2020-09-03 LAB — CBC
HCT: 37.5 % — ABNORMAL LOW (ref 39.0–52.0)
Hemoglobin: 9.9 g/dL — ABNORMAL LOW (ref 13.0–17.0)
MCH: 17.9 pg — ABNORMAL LOW (ref 26.0–34.0)
MCHC: 26.4 g/dL — ABNORMAL LOW (ref 30.0–36.0)
MCV: 67.8 fL — ABNORMAL LOW (ref 80.0–100.0)
Platelets: 254 10*3/uL (ref 150–400)
RBC: 5.53 MIL/uL (ref 4.22–5.81)
RDW: 29.3 % — ABNORMAL HIGH (ref 11.5–15.5)
WBC: 14.3 10*3/uL — ABNORMAL HIGH (ref 4.0–10.5)
nRBC: 1 % — ABNORMAL HIGH (ref 0.0–0.2)

## 2020-09-03 LAB — BASIC METABOLIC PANEL
Anion gap: 8 (ref 5–15)
BUN: 18 mg/dL (ref 8–23)
CO2: 35 mmol/L — ABNORMAL HIGH (ref 22–32)
Calcium: 8.4 mg/dL — ABNORMAL LOW (ref 8.9–10.3)
Chloride: 88 mmol/L — ABNORMAL LOW (ref 98–111)
Creatinine, Ser: 1.03 mg/dL (ref 0.61–1.24)
GFR, Estimated: >60 mL/min (ref >60)
Glucose, Bld: 106 mg/dL — ABNORMAL HIGH (ref 70–99)
Potassium: 4 mmol/L (ref 3.5–5.1)
Sodium: 131 mmol/L — ABNORMAL LOW (ref 135–145)

## 2020-09-03 LAB — MAGNESIUM: Magnesium: 2.3 mg/dL (ref 1.7–2.4)

## 2020-09-03 LAB — HEPARIN LEVEL (UNFRACTIONATED): Heparin Unfractionated: <0.1 IU/mL — ABNORMAL LOW (ref 0.30–0.70)

## 2020-09-03 MED ORDER — MIDODRINE HCL 5 MG PO TABS
10.0000 mg | ORAL_TABLET | Freq: Three times a day (TID) | ORAL | Status: DC
  Administered 2020-09-04 – 2020-09-05 (×6): 10 mg via ORAL
  Filled 2020-09-03 (×7): qty 2

## 2020-09-03 MED ORDER — SODIUM CHLORIDE 0.9 % IV BOLUS
250.0000 mL | Freq: Once | INTRAVENOUS | Status: AC
  Administered 2020-09-03: 250 mL via INTRAVENOUS

## 2020-09-03 MED ORDER — SODIUM CHLORIDE 0.9 % IV SOLN: INTRAVENOUS | Status: AC

## 2020-09-03 MED ORDER — DOXAZOSIN MESYLATE 2 MG PO TABS
2.0000 mg | ORAL_TABLET | Freq: Every day | ORAL | Status: DC
  Administered 2020-09-03 – 2020-09-04 (×2): 2 mg via ORAL
  Filled 2020-09-03 (×4): qty 1

## 2020-09-03 MED ORDER — APIXABAN 5 MG PO TABS
5.0000 mg | ORAL_TABLET | Freq: Two times a day (BID) | ORAL | Status: DC
  Administered 2020-09-03 – 2020-09-09 (×13): 5 mg via ORAL
  Filled 2020-09-03 (×13): qty 1

## 2020-09-03 MED ORDER — LOSARTAN POTASSIUM 25 MG PO TABS
12.5000 mg | ORAL_TABLET | Freq: Two times a day (BID) | ORAL | Status: DC
  Administered 2020-09-03 (×2): 12.5 mg via ORAL
  Filled 2020-09-03: qty 1
  Filled 2020-09-03 (×2): qty 0.5

## 2020-09-03 NOTE — Progress Notes (Signed)
Patient to transfer to 5w handover given to [facility]

## 2020-09-03 NOTE — Progress Notes (Addendum)
Sent chat to Dr. Quillian Quince:  The patient just arrived on our unit, 5W26.  His foley was removed at 08:28 this morning, and he states he is having a hard time urinating.  His bladder scan shows 321 mL.  Please advise.  Thanks!  Send to Nurse Wende: okay to use foley

## 2020-09-03 NOTE — Progress Notes (Addendum)
Patient ID: Perry Hunter, male   DOB: 07/19/1951, 70 y.o.   MRN: [id number]    Advanced Heart Failure Rounding Note   Subjective:    08/29/20 S/P EGD - gastritis. Recommendations for carafate +IV protonix.    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 Elevated filling pressures with preserved CO.  Off of milrinone with Co-ox 70%, CVP 3  Afib low 100s on amio gtt at 60/hr.   He remains on 5L [place].   Perry Hunter has no complaints, alert and oriented x 4.     R/LHC   Prox RCA lesion is 99% stenosed.  Mid LAD to Dist LAD lesion is 40% stenosed.  Prox LAD to Mid LAD lesion is 30% stenosed.  Mid Cx lesion is 100% stenosed.  Dist Cx lesion is 90% stenosed.   Findings:  Ao = 102/65 (80)  LV =  93/25 RA = 14 RV = 52/15 PA = 53/19 (35) PCW = 28 Fick cardiac output/index = 6.0/2.8 PVR = 1.1 WU Ao sat = 93% PA sat = 51%, 54%  Objective:   Weight Range:  Vital Signs:   Temp:  [97.8 F (36.6 C)-99.3 F (37.4 C)] 98 F (36.7 C) (02/05 2116) Pulse Rate:  [39-174] 93 (02/06 0000) Resp:  [13-35] 13 (02/06 0000) BP: (94-209)/(55-190) 94/67 (02/06 0000) SpO2:  [82 %-100 %] 97 % (02/06 0000) Weight:  [103.4 kg] 103.4 kg (02/05 0500) Last BM Date: 09/02/20  Weight change: Filed Weights   08/30/20 1220 09/01/20 0500 09/02/20 0500  Weight: 105.8 kg 105 kg 103.4 kg    Intake/Output:   Intake/Output Summary (Last 24 hours) at 09/03/2020 0332 Last data filed at 09/02/2020 2200 Gross per 24 hour  Intake 1102.66 ml  Output 3200 ml  Net -2097.34 ml    PHYSICAL EXAM: CVP 3 General:  Chronic ill appearing with scattered ecchymosis.  HEENT: normal Neck: supple.  Cor: Irregular rate & rhythm. No rubs, gallops or murmurs. Lungs: Wheezing Abdomen: soft, nontender, nondistended. Good bowel sounds. Extremities: no cyanosis, clubbing, rash, edema.  Neuro: alert & oriented x 3,  moves all 4 extremities  w/o difficulty. Affect pleasant   Telemetry:  Afib rates 80-100s.  Personally reviewed.     Labs: Basic Metabolic Panel: Recent Labs  Lab 08/27/20 0344 08/27/20 1315 08/28/20 0313 08/29/20 0329 08/30/20 0453 08/31/20 0531 08/31/20 1622 08/31/20 1627 08/31/20 2125 09/01/20 0650 09/02/20 0247  NA 132* 133* 135 135 136 133* 141 132*  134*  --  132* 133*  K 5.4* 4.7 4.2 3.9 3.5 3.4* 3.4* 4.3  4.1  --  4.4 4.3  CL 96* 92* 87* 84* 82* 80*  --   --   --  82* 81*  CO2 25 28 33* 37* 44* 43*  --   --   --  41* 41*  GLUCOSE 135* 120* 140* 152* 122* 106*  --   --   --  130* 109*  BUN 43* 42* 38* 30* 24* 22  --   --   --  21 20  CREATININE 2.01* 1.90* 1.61* 1.31* 1.14 1.01  --   --  1.04 0.97 0.93  CALCIUM 8.2* 8.3* 8.5* 8.2* 8.4* 8.0*  --   --   --  8.2* 8.4*  MG 2.0 2.0 1.7 1.9 2.1 1.7  --   --   --   --   --   PHOS 5.0*  --  4.1 2.7  3.0  --   --   --   --   --   --     Liver Function Tests: Recent Labs  Lab 08/27/20 0344 08/28/20 0313 08/29/20 0329 08/30/20 0453  AST 85* 54* 40 35  ALT 77* 60* 51* 44  ALKPHOS 67 69 64 72  BILITOT 1.2 1.4* 1.8* 1.8*  PROT 6.6 6.9 6.5 6.7  ALBUMIN 3.2* 3.2* 3.1* 3.1*   No results for input(s): LIPASE, AMYLASE in the last 168 hours. No results for input(s): AMMONIA in the last 168 hours.  CBC: Recent Labs  Lab 08/27/20 0344 08/27/20 1315 08/28/20 0313 08/29/20 0329 08/30/20 0453 08/31/20 0531 08/31/20 1622 08/31/20 1627 08/31/20 2125 09/01/20 0650 09/02/20 0247  WBC 6.4   < > 10.3 12.2* 11.2* 9.5  --   --  9.9 12.5* 11.7*  NEUTROABS 5.7  --  9.4* 11.1* 8.5*  --   --   --   --   --   --   HGB 8.6*   < > 8.9* 9.1* 9.0* 8.9* 10.5* 12.9*  12.2* 9.1* 9.0* 9.3*  HCT 28.8*   < > 31.3* 31.0* 33.9* 33.3* 31.0* 38.0*  36.0* 33.7* 32.4* 34.5*  MCV 63.0*   < > 63.4* 62.9* 66.5* 67.1*  --   --  66.3* 66.4* 67.5*  PLT 186   < > 203 200 189 207  --   --  208 211 228   < > = values in this interval not displayed.    Cardiac  Enzymes: No results for input(s): CKTOTAL, CKMB, CKMBINDEX, TROPONINI in the last 168 hours.  BNP: BNP (last 3 results) Recent Labs    08/25/20 1452  BNP 527.0*    ProBNP (last 3 results) No results for input(s): PROBNP in the last 8760 hours.    Other results:  Imaging: No results found.   Medications:     Scheduled Medications: . acetaZOLAMIDE  500 mg Oral BID  . albuterol  2 puff Inhalation Q6H  . vitamin C  500 mg Oral Daily  . Chlorhexidine Gluconate Cloth  6 each Topical Daily  . dextromethorphan-guaiFENesin  1 tablet Oral BID  . digoxin  0.125 mg Oral Daily  . feeding supplement  237 mL Oral TID BM  . fluticasone furoate-vilanterol  1 puff Inhalation Daily  . folic acid  1 mg Oral Daily  . insulin aspart  0-5 Units Subcutaneous QHS  . insulin aspart  0-9 Units Subcutaneous TID WC  . mouth rinse  15 mL Mouth Rinse BID  . multivitamin with minerals  1 tablet Oral Daily  . pantoprazole (PROTONIX) IV  40 mg Intravenous Q12H  . potassium chloride  40 mEq Oral BID  . ramelteon  8 mg Oral QHS  . rosuvastatin  20 mg Oral Daily  . sodium chloride flush  10-40 mL Intracatheter Q12H  . sodium chloride flush  3 mL Intravenous Q12H  . spironolactone  25 mg Oral Daily  . sucralfate  1 g Oral QID  . thiamine  100 mg Oral Daily  . umeclidinium bromide  1 puff Inhalation Daily  . zinc sulfate  220 mg Oral Daily    Infusions: . sodium chloride 10 mL/hr at 09/02/20 2200  . sodium chloride    . amiodarone 30 mg/hr (09/02/20 2226)  . heparin 1,350 Units/hr (09/02/20 2200)    PRN Medications: sodium chloride, acetaminophen, chlorpheniramine-HYDROcodone, guaiFENesin-dextromethorphan, ondansetron (ZOFRAN) IV, sodium chloride flush, sodium chloride flush   Assessment/Plan:  1. 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 Elevated filling pressures with preserved CO (on milrinone).   - Plan for medical management of CAD.  - Off milrinone. Co-ox 70 %. CVP 3 - Start po torsemide, will discuss with Dr. Aundra Dubin. - Continue spiro 25 mg daily.  - BP too soft for ARB/ARNi  - c/w digoxin 0.125 daily and diamox 500 mg BID.   2. Acute hypoxic respiratory failure in setting of COVID PNA /AECOPD - Unvacinated CXR shows RML/RLL opacity.  Requiring 5L of oxygen via [place].  - Treatment per CCM: has had Remdesivr, off steroids now. Completed 7 days of doxycycline.    3. Persistent AF with RVR - has h/o AF at home. Unclear if this is chronic or not. Given that he is on amio at home suspect not chronic  - Continue amio gtt at 30/hr  - off AC due to GIB => starting heparin gtt today, follow for recurrent bleeding.  - If he tolerates anticoagulation, plan TEE/DC-CV next week.   4. GIB/Anemia - On eliquis for afib, presentation hgb 6.1, +FOBT; Ferritin 27, iron 13, folate 10.9.  S/p transfused 3 units of PRBCs -> hgb 8.7.  MCV 63. - S/P EGD 2/1 with gastritis. On protonix + carafate.  - Per GI--> not a candidate for colonoscopy at this time. CT A/P without concerning lesions - Hgb 9.9 - C/w heparin gtt today, follow for rebleeding.   5. AKI - Likely due to ATN/cardiorenal - Baseline Cr 1.15, peak Cr 2.59 - improved, SCr now 1.03  6. DM2 - per CCM   7. Hypovolemic Hyponatremic - Mild.  - continue diuresis   Length of Stay: [place], NP  3:32 AM  Patient seen with NP, agree with the above note.   Weight down today with I/Os negative, CVP 3.  Co-ox 69%.  He remains in rate-controlled atrial fibrillation.  Heparin gtt started yesterday morning, level remains undetectable.  Hgb stable at 9.9.  General: NAD Neck: No JVD, no thyromegaly or thyroid  nodule.  Lungs: Clear to auscultation bilaterally with normal respiratory effort. CV: Nondisplaced PMI.  Heart irregular S1/S2, no S3/S4, no murmur.  No peripheral edema.  Abdomen: Soft, nontender, no hepatosplenomegaly, no distention.  Skin: Intact without lesions or rashes.  Neurologic: Alert and oriented x 3.  Psych: Normal affect. Extremities: No clubbing or cyanosis.  HEENT: Normal.   Patient is stable off milrinone, co-ox 69% with CVP 3.  - Can hold diuretics today, likely start torsemide tomorrow.  - Continue digoxin.  - Continue spironolactone.  - BP better, add losartan 12.5 mg bid and can transition to Entresto if tolerates.   Hgb stable, no overt bleeding.  Heparin started yesterday but level remains undetectable.   - Will stop heparin gtt today and start Eliquis.  - If tolerates Eliquis without bleeding, plan for TEE-DCCV.  Will keep NPO at midnight in case this can be done tomorrow.   Loralie Champagne 09/03/2020 8:17 AM

## 2020-09-03 NOTE — Progress Notes (Signed)
Checked on the patient  Mentating well Denies any significant symptoms at present  On amiodarone  BP about 70/50 Clear breath sounds S1-S2 appreciated  I will give him a saline bolus and start midodrine '10mg'$  3 times daily

## 2020-09-03 NOTE — Progress Notes (Signed)
Paged PCCM at [phone] to report b/p of 85/56 with a MAP of 65 and pt QTC has been greater than 500.   Dr. Jeannene Patella ordered ekg and states if QTC is greater than 500 page cardiology.  Dr. Jeannene Patella called again pt blood pressure dropped to 72/46. Ground team will see patient.

## 2020-09-03 NOTE — Progress Notes (Signed)
[place] for heparin Indication: atrial fibrillation  Labs: Recent Labs    09/01/20 0650 09/02/20 0247 09/02/20 1638 09/03/20 0310 09/03/20 0327  HGB 9.0* 9.3*  --   --  9.9*  HCT 32.4* 34.5*  --   --  37.5*  PLT 211 228  --   --  254  HEPARINUNFRC  --   --  <0.10* <0.10*  --   CREATININE 0.97 0.93  --   --  1.03    Assessment: 70 yo male with HF, COVID+ and afib. He is on apixban PTA for afib and this has been on hold for GIB (upper endoscopy with gastritis). Pharmacy consulted to dose heparin -hg= 9.3, plt= 228  Heparin level remains undetectable. No bleeding issues noted.   Goal of Therapy:  Heparin level 0.3-0.5 units/ml Monitor platelets by anticoagulation protocol: Yes   Plan:   -No heparin bolus -Increase heparin to 1550 units/hr -Heparin level in ~6 hours and daily wth CBC daily  Samina Weekes,PharmD Clinical Pharmacist 09/03/2020 4:48 AM

## 2020-09-03 NOTE — Progress Notes (Addendum)
Notified of MAP 69 , no symptoms Has had soft Bps all day, no bleeding noted and not new - continue to monitor Cardiac meds per cardiology- RN notes that monitor tech mentions prolonged Qtc? No EKG in system and is on amiodarone. Will get a 12 leak EKG and if prolonged, cardiology to decide about amiodarone  9:55 pm Hypotension in 70s though no symptoms Still on HFNC Was on dopamine in the ICU Have asked ground team to evaluate since I am not able to see him on camera  3 am  Benadyl ordered for some itching Of note, last 3 MAPs have been in the 70s for this patient per charting Bedside PCCM had evaluated the patient and we have given a dose of midodrine, fluids were ordered per PCCM

## 2020-09-03 NOTE — Care Management (Signed)
Eliquis is covered by Medicaid. Cost ~$3.50/month.  Patient is also eligible for manufacture's 30 day free coupon card for first Rx fill.

## 2020-09-03 NOTE — Progress Notes (Addendum)
NAME:  Perry Hunter, MRN:  [id number], DOB:  1950-09-27, LOS: 9 ADMISSION DATE:  08/25/2020, CONSULTATION DATE:  08/26/20 REFERRING MD:  Dr Manuella Ghazi, CHIEF COMPLAINT:  Chf, gib  Brief History   70 yo male with pmh dm2, GERD, tobacco abuse, copd presented to [facility] with worsening sob/doe and edema as well as black stool x1 month.   History of present illness   70 yo male with pmh HTN, Hyperlipidemia, T2DM, GERD, tobacco abuse, copd, paf on eliquis who presented to osh at the insistence of his family for weeks of increasing abdominal girth/le edema, sob/doe and black tarry stools for 1 month.   Upon presentation pt was found to have hgb of 6, tachycardic, infiltrate on cxr (noted to be covid + as well). He was grossly volume up with hypotension and hemoccult positive.   Pt was started on neo and uptitrated to >124mg, subsequently transitioned to dopamine after conversation with cardiology by Dr SManuella Ghazi Echo was done showing LVEF 20-25% with grade 3 diastolic dysfunction and severe RH dysfunction with RVSP 44.467mg  GI was consulted for his +hemoccult and recommended endoscopy when pt stabilized. At this time he has received 3u prbc.   Pt arrived to our ICU was alert and oriented x2.  On 5 mcg of dopamine.  Satting in 100s on 4 L.  Denied any overt shortness of breath.  Denied any GI symptoms did endorse melena previously consent was obtained from patient and wife for central line and A-line.   Past Medical History   Past Medical History:  Diagnosis Date  . Arthritis   . Asthma   . [place] ([place]ive heart failure) ([facility] [place]  . Diabetes mellitus without complication ([facility]  . GERD (gastroesophageal reflux disease)   . Hypercholesteremia   . Hypertension    Also with [facility] fib.  [facility]   [facility]  Procedures:  Central line A-line  Significant Diagnostic Tests:  1/29 echo: LVEF 20-25% grade  III diastolic dysfunction, rh severely dysfunctional.  1/30 abdominal Us>> nothing remarkable 08/31/20 RHC Ao = 102/65 (80)  LV =  93/25 RA = 14 RV = 52/15 PA = 53/19 (35) PCW = 28 Fick cardiac output/index = 6.0/2.8 PVR = 1.1 WU Ao sat = 93% PA sat = 51%, 54%  LHC  Prox RCA lesion is 99% stenosed.  Mid LAD to Dist LAD lesion is 40% stenosed.  Prox LAD to Mid LAD lesion is 30% stenosed.  Mid Cx lesion is 100% stenosed.  Dist Cx lesion is 90% stenosed.  Micro Data:  1/28 sars2: positive  Antimicrobials:  N/A  Interim history/subjective:  Doing well HR improved Continues to diurese Down to 4L [place]  Objective   Blood pressure 113/77, pulse 81, temperature 97.7 F (36.5 C), temperature source Oral, resp. rate 17, height '5\' 8"'$  (1.727 m), weight 98.9 kg, SpO2 100 %. CVP:  [4 mmHg-13 mmHg] 8 mmHg      Intake/Output Summary (Last 24 hours) at 09/03/2020 0748 Last data filed at 09/03/2020 0700 Gross per 24 hour  Intake 1291.82 ml  Output 3535 ml  Net -2243.18 ml   Filed Weights   09/01/20 0500 09/02/20 0500 09/03/20 0400  Weight: 105 kg 103.4 kg 98.9 kg    Examination: Constitutional: no acute distress, chronically ill appearing sitting in chair eating breakfast Eyes: eomi, pupils equal Ears, nose, mouth, and throat: MMM, trachea midline Cardiovascular: irregular,  ext warm Respiratory: lungs clear Gastrointestinal: Soft, +BS Skin: No rashes, normal turgor Neurologic: moves all 4 ext Psychiatric: RASS 0 GU: foley with amber urine  Cr looks good Mild hyponatremia WBC slightly up  Resolved Hospital Problem list   N/A  Assessment & Plan:   Acute hypoxic respiratory failure due to COVID-19 infection- incidental, pulmonary edema primary driver; usual airborne isolation period. S/p 3 days remdesivir ppx COPD- thought was in flare but probably just fluid and now near baseline resp status Tobacco use disorder  Overall improving daily -Wean down O2, he is on  some at home, may be nearing home O2 needs -IS encouraged again, up to chair -Incruse/breo -s/p doxycycline x 5 days -OOB to chair, appreciate PT input, rec'd for [facility]  Acute HFrEF Cardiogenic Shock- CI good with milrinone Cardiorenal syndrome - Diuresis per CHF team - Continue AC challenge for potential cardioversion this week  Afib on eliquis PTA-  Improved HR - Amio gtt, heparin challenge - Eventual TEE and cardioversion then can switch back to amio PO and NoAC  Subacute GIB- looks to be due to severe esophagitis, CT A/P benign - Continue PPI - AC challenge starting today  Hematologic abnormalities: would not worry about in setting of COVID, acute cardiorenal syndrome.  Can recheck diff as OP.  DM2- well controlled on SSI  ICU delirium- resolved, continue ramelteon  ICU maintenance- keep central line until after cardioversion, dc foley, check bladder scans qshift  Best practice:  Diet: cardiac Pain/Anxiety/Delirium protocol (if indicated): not indicated VAP protocol (if indicated): n/a DVT prophylaxis: heparin gtt  GI prophylaxis: PPI BID Glucose control: as above Mobility: up to chair Code Status: Full Family Communication: updated patient Disposition: stable for progressive appreciate TRH taking over care 09/04/20  Erskine Emery MD [facility] 08/28/2020 7:23 AM Prefer epic messenger for cross cover needs If after hours, please call E-link

## 2020-09-04 DIAGNOSIS — I480 Paroxysmal atrial fibrillation: Secondary | ICD-10-CM | POA: Diagnosis not present

## 2020-09-04 DIAGNOSIS — I5043 Acute on chronic combined systolic (congestive) and diastolic (congestive) heart failure: Secondary | ICD-10-CM

## 2020-09-04 DIAGNOSIS — E871 Hypo-osmolality and hyponatremia: Secondary | ICD-10-CM

## 2020-09-04 DIAGNOSIS — R570 Cardiogenic shock: Secondary | ICD-10-CM | POA: Diagnosis not present

## 2020-09-04 DIAGNOSIS — J449 Chronic obstructive pulmonary disease, unspecified: Secondary | ICD-10-CM | POA: Diagnosis not present

## 2020-09-04 DIAGNOSIS — I5020 Unspecified systolic (congestive) heart failure: Secondary | ICD-10-CM | POA: Diagnosis not present

## 2020-09-04 LAB — CBC
HCT: 31.7 % — ABNORMAL LOW (ref 39.0–52.0)
Hemoglobin: 8.9 g/dL — ABNORMAL LOW (ref 13.0–17.0)
MCH: 18.8 pg — ABNORMAL LOW (ref 26.0–34.0)
MCHC: 28.1 g/dL — ABNORMAL LOW (ref 30.0–36.0)
MCV: 66.9 fL — ABNORMAL LOW (ref 80.0–100.0)
Platelets: 236 10*3/uL (ref 150–400)
RBC: 4.74 MIL/uL (ref 4.22–5.81)
RDW: 28.9 % — ABNORMAL HIGH (ref 11.5–15.5)
WBC: 11.4 10*3/uL — ABNORMAL HIGH (ref 4.0–10.5)
nRBC: 1.2 % — ABNORMAL HIGH (ref 0.0–0.2)

## 2020-09-04 LAB — BASIC METABOLIC PANEL
Anion gap: 8 (ref 5–15)
BUN: 20 mg/dL (ref 8–23)
CO2: 30 mmol/L (ref 22–32)
Calcium: 8.3 mg/dL — ABNORMAL LOW (ref 8.9–10.3)
Chloride: 92 mmol/L — ABNORMAL LOW (ref 98–111)
Creatinine, Ser: 1.06 mg/dL (ref 0.61–1.24)
GFR, Estimated: >60 mL/min (ref >60)
Glucose, Bld: 103 mg/dL — ABNORMAL HIGH (ref 70–99)
Potassium: 4.2 mmol/L (ref 3.5–5.1)
Sodium: 130 mmol/L — ABNORMAL LOW (ref 135–145)

## 2020-09-04 LAB — COOXEMETRY PANEL
Carboxyhemoglobin: 1.4 % (ref 0.5–1.5)
Methemoglobin: 0.9 % (ref 0.0–1.5)
O2 Saturation: 77.8 %
Total hemoglobin: 9.2 g/dL — ABNORMAL LOW (ref 12.0–16.0)

## 2020-09-04 LAB — GLUCOSE, CAPILLARY: Glucose-Capillary: 92 mg/dL (ref 70–99)

## 2020-09-04 LAB — MAGNESIUM: Magnesium: 2.1 mg/dL (ref 1.7–2.4)

## 2020-09-04 MED ORDER — MIDODRINE HCL 5 MG PO TABS
10.0000 mg | ORAL_TABLET | Freq: Once | ORAL | Status: AC
  Administered 2020-09-04: 10 mg via ORAL
  Filled 2020-09-04: qty 2

## 2020-09-04 MED ORDER — ALBUMIN HUMAN 25 % IV SOLN
25.0000 g | INTRAVENOUS | Status: AC
  Administered 2020-09-04: 25 g via INTRAVENOUS
  Filled 2020-09-04: qty 100

## 2020-09-04 MED ORDER — DIPHENHYDRAMINE HCL 25 MG PO CAPS
25.0000 mg | ORAL_CAPSULE | Freq: Once | ORAL | Status: AC
  Administered 2020-09-04: 25 mg via ORAL
  Filled 2020-09-04: qty 1

## 2020-09-04 MED ORDER — ALBUMIN HUMAN 25 % IV SOLN: 25.0000 g | Freq: Once | INTRAVENOUS | Status: DC

## 2020-09-04 MED ORDER — LACTATED RINGERS IV SOLN: INTRAVENOUS | Status: AC

## 2020-09-04 MED ORDER — NOREPINEPHRINE 16 MG/250ML-% IV SOLN
0.0000 ug/min | INTRAVENOUS | Status: DC
  Administered 2020-09-04: 10 ug/min via INTRAVENOUS
  Filled 2020-09-04: qty 250

## 2020-09-04 MED ORDER — NOREPINEPHRINE 4 MG/250ML-% IV SOLN
0.0000 ug/min | INTRAVENOUS | Status: DC
  Administered 2020-09-04: 10 ug/min via INTRAVENOUS
  Filled 2020-09-04 (×2): qty 250

## 2020-09-04 NOTE — Plan of Care (Signed)
Cardiology paged regarding concern for QTc of 500 ms on telemetry and subsequently hypotension.   Prior EKGs reviewed patient with QTC approximately 500 in the setting of prolonged interventricular conduction abnormality.  Likely over estimation, and JTC would be more accurate.  Regardless would not recommend stopping amiodarone given this measurement.  Regarding patient's hypertension.  It appears approximately 4 PM patient has had systolic blood pressures in the 80s with mean arterial pressures in the high 60s.  Patient is asymptomatic events.  On encounter his biggest concern is abdominal itching.  Critical care for team has already evaluated the patient and given albumin as well as fluid bolus.  Milrinone 2/5 with normal blood pressures following this.  He has not signs of cardiogenic shock on examination.  Plan I suspect patient's low blood pressures are secondary to initiation of losartan the past 24 hours.  Less likely overdiuresis.  Recommend to hold losartan.

## 2020-09-04 NOTE — Progress Notes (Signed)
Patient's BP decreased, notified MD. Patient only symptom is dizziness when sitting up. Rapid response nurse on the way to bedside.

## 2020-09-04 NOTE — Progress Notes (Signed)
OT Cancellation Note  Patient Details Name: Perry Hunter MRN: [id number] DOB: 1951/03/09   Cancelled Treatment:    Reason Eval/Treat Not Completed: Medical issues which prohibited therapy (Transfer to ICU for hypotension)  [place], OT/L   Acute OT Clinical Specialist [place] Pager [phone] Office [phone]  09/04/2020, 10:04 AM

## 2020-09-04 NOTE — Significant Event (Signed)
Rapid Response Event Note   Reason for Call :  Continued hypotension.  He becomes dizzy when he sits upright.  Initial Focused Assessment:  Patient lying in bed,  He is tired but alert and oriented.   Lung sounds decreased bases Heart tones irregular Checked BP in all extremities Left arm: 67/45    Right arm:  71/52 Left leg 98/54       Right leg:  106/55  Dr Sloan Leiter and Shirlee Limerick NP at bedside to assess patient UOP 90 cc over past 4 hours  Interventions:    Plan of Care:  Transfer to ICU  Event Summary:   MD Notified: Oren Binet Call Time: [id number] Arrival Time: [id number] End Time:  0845  Raliegh Ip, RN

## 2020-09-04 NOTE — Progress Notes (Addendum)
Patient ID: Perry Hunter, male   DOB: 1951/01/08, 70 y.o.   MRN: [id number]    Advanced Heart Failure Rounding Note   Subjective:    08/29/20 S/P EGD - gastritis. Recommendations for carafate +IV protonix.    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 Elevated filling pressures with preserved CO.  Off of milrinone with Co-ox 78%   Remains in Afib on amio gtt at 60/hr.  HR 70s. On Eliquis. Hbg 9.3>>9.9>>8.9  Diuretics held yesterday for low CVP (3). Transferred to 5W yesterday.    This morning developed profound hypotension w/ SBP in the 70s and MAPs in the 50s. Transferred back to CCU for NE.   Mentating ok. RN to set up CVP      R/LHC   Prox RCA lesion is 99% stenosed.  Mid LAD to Dist LAD lesion is 40% stenosed.  Prox LAD to Mid LAD lesion is 30% stenosed.  Mid Cx lesion is 100% stenosed.  Dist Cx lesion is 90% stenosed.   Findings:  Ao = 102/65 (80)  LV =  93/25 RA = 14 RV = 52/15 PA = 53/19 (35) PCW = 28 Fick cardiac output/index = 6.0/2.8 PVR = 1.1 WU Ao sat = 93% PA sat = 51%, 54%  Objective:   Weight Range:  Vital Signs:   Temp:  [97.4 F (36.3 C)-98.7 F (37.1 C)] 98 F (36.7 C) (02/07 1000) Pulse Rate:  [51-99] 51 (02/07 1000) Resp:  [11-18] 15 (02/07 1000) BP: (53-140)/(38-119) 72/56 (02/07 1000) SpO2:  [94 %-100 %] 94 % (02/07 1000) Weight:  [105 kg] 105 kg (02/07 0345) Last BM Date: 09/04/20  Weight change: Filed Weights   09/02/20 0500 09/03/20 0400 09/04/20 0345  Weight: 103.4 kg 98.9 kg 105 kg    Intake/Output:   Intake/Output Summary (Last 24 hours) at 09/04/2020 1018 Last data filed at 09/04/2020 1000 Gross per 24 hour  Intake 1491.16 ml  Output 800 ml  Net 691.16 ml    PHYSICAL EXAM: General: fatigue appearing elderly WM. No respiratory difficulty HEENT: normal Neck: supple. no JVD. Carotids 2+ bilat; no bruits. No lymphadenopathy or thyromegaly  appreciated. Cor: PMI nondisplaced. Irregularly irregular rhythm and rate. No rubs, gallops or murmurs. Lungs: clear Abdomen: soft, nontender, nondistended. No hepatosplenomegaly. No bruits or masses. Good bowel sounds. Extremities: no cyanosis, clubbing, rash, edema Neuro: alert & oriented x 3, cranial nerves grossly intact. moves all 4 extremities w/o difficulty. Affect pleasant.    Telemetry:  Afib rates 70s.  Personally reviewed.     Labs: Basic Metabolic Panel: Recent Labs  Lab 08/29/20 0329 08/30/20 0453 08/31/20 0531 08/31/20 1622 08/31/20 1627 08/31/20 2125 09/01/20 0650 09/02/20 0247 09/03/20 0327 09/04/20 0437  NA 135 136 133*   < > 132*  134*  --  132* 133* 131* 130*  K 3.9 3.5 3.4*   < > 4.3  4.1  --  4.4 4.3 4.0 4.2  CL 84* 82* 80*  --   --   --  82* 81* 88* 92*  CO2 37* 44* 43*  --   --   --  41* 41* 35* 30  GLUCOSE 152* 122* 106*  --   --   --  130* 109* 106* 103*  BUN 30* 24* 22  --   --   --  '21 20 18 20  '$ CREATININE 1.31* 1.14 1.01  --   --  1.04 0.97 0.93 1.03 1.06  CALCIUM 8.2* 8.4* 8.0*  --   --   --  8.2* 8.4* 8.4* 8.3*  MG 1.9 2.1 1.7  --   --   --   --   --  2.3 2.1  PHOS 2.7 3.0  --   --   --   --   --   --   --   --    < > = values in this interval not displayed.    Liver Function Tests: Recent Labs  Lab 08/29/20 0329 08/30/20 0453  AST 40 35  ALT 51* 44  ALKPHOS 64 72  BILITOT 1.8* 1.8*  PROT 6.5 6.7  ALBUMIN 3.1* 3.1*   No results for input(s): LIPASE, AMYLASE in the last 168 hours. No results for input(s): AMMONIA in the last 168 hours.  CBC: Recent Labs  Lab 08/29/20 0329 08/30/20 0453 08/31/20 0531 08/31/20 2125 09/01/20 0650 09/02/20 0247 09/03/20 0327 09/04/20 0437  WBC 12.2* 11.2*   < > 9.9 12.5* 11.7* 14.3* 11.4*  NEUTROABS 11.1* 8.5*  --   --   --   --   --   --   HGB 9.1* 9.0*   < > 9.1* 9.0* 9.3* 9.9* 8.9*  HCT 31.0* 33.9*   < > 33.7* 32.4* 34.5* 37.5* 31.7*  MCV 62.9* 66.5*   < > 66.3* 66.4* 67.5* 67.8* 66.9*   PLT 200 189   < > 208 211 228 254 236   < > = values in this interval not displayed.    Cardiac Enzymes: No results for input(s): CKTOTAL, CKMB, CKMBINDEX, TROPONINI in the last 168 hours.  BNP: BNP (last 3 results) Recent Labs    08/25/20 1452  BNP 527.0*    ProBNP (last 3 results) No results for input(s): PROBNP in the last 8760 hours.    Other results:  Imaging: No results found.   Medications:     Scheduled Medications: . albuterol  2 puff Inhalation Q6H  . apixaban  5 mg Oral BID  . vitamin C  500 mg Oral Daily  . Chlorhexidine Gluconate Cloth  6 each Topical Daily  . dextromethorphan-guaiFENesin  1 tablet Oral BID  . digoxin  0.125 mg Oral Daily  . doxazosin  2 mg Oral Daily  . feeding supplement  237 mL Oral TID BM  . fluticasone furoate-vilanterol  1 puff Inhalation Daily  . folic acid  1 mg Oral Daily  . insulin aspart  0-5 Units Subcutaneous QHS  . insulin aspart  0-9 Units Subcutaneous TID WC  . mouth rinse  15 mL Mouth Rinse BID  . midodrine  10 mg Oral TID WC  . multivitamin with minerals  1 tablet Oral Daily  . pantoprazole (PROTONIX) IV  40 mg Intravenous Q12H  . potassium chloride  40 mEq Oral BID  . ramelteon  8 mg Oral QHS  . rosuvastatin  20 mg Oral Daily  . sodium chloride flush  10-40 mL Intracatheter Q12H  . sodium chloride flush  3 mL Intravenous Q12H  . spironolactone  25 mg Oral Daily  . sucralfate  1 g Oral QID  . thiamine  100 mg Oral Daily  . umeclidinium bromide  1 puff Inhalation Daily  . zinc sulfate  220 mg Oral Daily    Infusions: . sodium chloride Stopped (09/03/20 1317)  . sodium chloride    . amiodarone 60 mg/hr (09/04/20 1000)  . lactated ringers 50 mL/hr at 09/04/20 1000    PRN Medications: sodium chloride,  acetaminophen, chlorpheniramine-HYDROcodone, guaiFENesin-dextromethorphan, ondansetron (ZOFRAN) IV, sodium chloride flush, sodium chloride flush   Assessment/Plan:    1. 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 Elevated filling pressures with preserved CO (on milrinone).   - Plan for medical management of CAD.  - Off milrinone. Co-ox good at 78%. - adding NE for BP support  - Continue to hold diuretics given hypotension. CVP was low yesterday. Will set up CVP again.  - gentle IVF hydration  - Hold losartan and Arlyce Harman for now - c/w digoxin 0.125 daily   2. Acute hypoxic respiratory failure in setting of COVID PNA /AECOPD - Unvacinated CXR shows RML/RLL opacity.  Requiring 3L of oxygen via [place].  - Treatment per CCM: has had Remdesivr, off steroids now. Completed 7 days of doxycycline.    3. Persistent AF with RVR - has h/o AF at home. Unclear if this is chronic or not. Given that he is on amio at home suspect not chronic  - Continue amio gtt at 30/hr  - was initially off AC due to GIB. Now on Eliquis. Hgb stable at 8.9.  Follow for recurrent bleeding.  - will need TEE/DC-CV. Not today. Will post-pone for now given hypotension   4. GIB/Anemia - On eliquis for afib, presentation hgb 6.1, +FOBT; Ferritin 27, iron 13, folate 10.9.  S/p transfused 3 units of PRBCs -> hgb 8.7.  MCV 63. - S/P EGD 2/1 with gastritis. On protonix + carafate.  - Per GI--> not a candidate for colonoscopy at this time. CT A/P without concerning lesions - he is back on a/c, Eliquis 5 mg bid  - Hgb 8.9 today  - Follow closely for rebleeding.   5. AKI - Likely due to ATN/cardiorenal - Baseline Cr 1.15, peak Cr 2.59 - improved, SCr now 1.06  6. DM2 - per CCM   7. Hyponatremia - Na 130 - hold diuretics for now w/ hypotension   Length of Stay: 10  Brittainy Simmons, PA-C  10:18 AM   Agree with above.    Moved back to ICU this am due to profound hypotension in setting of low volume status. Treated initially with NE and IVF. NE now off. SBP still in 90s.  Denies CP. Still mildly SOB. Remains in AF.   General:  Chronically ill appearing. No resp difficulty HEENT: normal Neck: supple. no JVD. Carotids 2+ bilat; no bruits. No lymphadenopathy or thryomegaly appreciated. Cor: PMI nondisplaced. Irregular rate & rhythm. No rubs, gallops or murmurs. Lungs: + coarse Abdomen: obese soft, nontender, nondistended. No hepatosplenomegaly. No bruits or masses. Good bowel sounds. Extremities: no cyanosis, clubbing, rash, edema Neuro: alert & orientedx3, cranial nerves grossly intact. moves all 4 extremities w/o difficulty. Affect pleasant  Hypotension marginally improved with IVF. Can add abck NE as needed. Co-ox ok at 78%. Hgb stable on Eliquis. Continue amio for AF. Possible TEE/DC-CV tomorrow if clinically stabilized.   CRITICAL CARE Performed by: Glori Bickers  Total critical care time: 35 minutes  Critical care time was exclusive of separately billable procedures and treating other patients.  Critical care was necessary to treat or prevent imminent or life-threatening deterioration.  Critical care was time spent personally by me (independent of midlevel providers or residents) on  the following activities: development of treatment plan with patient and/or surrogate as well as nursing, discussions with consultants, evaluation of patient's response to treatment, examination of patient, obtaining history from patient or surrogate, ordering and performing treatments and interventions, ordering and review of laboratory studies, ordering and review of radiographic studies, pulse oximetry and re-evaluation of patient's condition.   Glori Bickers, MD  4:37 PM

## 2020-09-04 NOTE — Progress Notes (Signed)
NAME:  Perry Hunter, MRN:  [id number], DOB:  [birth year], LOS: 82 ADMISSION DATE:  08/25/2020, CONSULTATION DATE:  08/26/20 REFERRING MD:  Dr Manuella Ghazi, CHIEF COMPLAINT:  Chf, gib  Brief History   70 yo male with pmh dm2, GERD, tobacco abuse, copd presented to osh with worsening sob/doe and edema as well as black stool x1 month.   History of present illness   70 yo male with pmh HTN, Hyperlipidemia, T2DM, GERD, tobacco abuse, copd, paf on eliquis who presented to osh at the insistence of his family for weeks of increasing abdominal girth/le edema, sob/doe and black tarry stools for 1 month.   Upon presentation pt was found to have hgb of 6, tachycardic, infiltrate on cxr (noted to be covid + as well). He was grossly volume up with hypotension and hemoccult positive.   Pt was started on neo and uptitrated to >1145mg, subsequently transitioned to dopamine after conversation with cardiology by Dr SManuella Ghazi Echo was done showing LVEF 20-25% with grade 3 diastolic dysfunction and severe RH dysfunction with RVSP 44.458mg  GI was consulted for his +hemoccult and recommended endoscopy when pt stabilized. At this time he has received 3u prbc.   Pt arrived to our ICU was alert and oriented x2.  On 5 mcg of dopamine.  Satting in 100s on 4 L.  Denied any overt shortness of breath.  Denied any GI symptoms did endorse melena previously consent was obtained from patient and wife for central line and A-line.   Past Medical History   Past Medical History:  Diagnosis Date  . Arthritis   . Asthma   . CHF (congestive heart failure) ([facility]  . Diabetes mellitus without complication ([facility]  . GERD (gastroesophageal reflux disease)   . Hypercholesteremia   . Hypertension    Also with history [facility]. fib.  [facility]   [facility], losartan started  2/7 back to icu for hypotension  [facility]  [facility]  Procedures:  Central line A  Line  Significant Diagnostic Tests:  1/29 echo: LVEF 20-25% grade III diastolic dysfunction, rh severely dysfunctional.  1/30 abdominal Us>> nothing remarkable 08/31/20 [facility] Ao = 102/65 (80)  LV =  93/25 RA = 14 RV = 52/15 PA = 53/19 (35) PCW = 28 Fick cardiac output/index = 6.0/2.8 PVR = 1.1 WU Ao sat = 93% PA sat = 51%, 54%  LHC  Prox RCA lesion is 99% stenosed.  Mid LAD to Dist LAD lesion is 40% stenosed.  Prox LAD to Mid LAD lesion is 30% stenosed.  Mid Cx lesion is 100% stenosed.  Dist Cx lesion is 90% stenosed.  Micro Data:  1/28 sars2: positive  Antimicrobials:  N/A  Interim history/subjective:  Hypotensive overnight and this morning Got 500 ml IVF, albumin, midodrine started Seen by ccm and cardiology overnight, card rec to hold losartan   coox is ok, 77.8 Continues on amio gtt at '60mg'$ /hr   Urine production has decreased to .45m60mg/hr (output and bladder scan)  Cr stable   Pt says he feels very dizzy when he sits upright. Asymptomatic hypotension when supine  Objective   Blood pressure (!) 78/49, pulse 75, temperature 98.7 F (37.1 C), temperature source Axillary, resp. rate 16, height '5\' 8"'$  (1.727 m), weight 105 kg, SpO2 98 %. CVP:  [10 mmHg-13 mmHg] 10 mmHg      Intake/Output Summary (Last 24 hours) at 09/04/2020 [id number] data  filed at 09/04/2020 0347 Gross per 24 hour  Intake 1264.06 ml  Output 800 ml  Net 464.06 ml   Filed Weights   09/02/20 0500 09/03/20 0400 09/04/20 0345  Weight: 103.4 kg 98.9 kg 105 kg    Examination: Constitutional: chronically and acutely ill appearing older adult M reclined in bed NAD  HEENT: NCAT pink tacky mm, anicteric sclera. Dry tongue Cardiovascular: irreg rhythm. cap refill < 3 seconds. s1s2 Respiratory: CTAb, no accessory use, symmetrical chest expansion Gastrointestinal: soft round ndnt  Skin: dry, warm, clean, scattered ecchymosis  Neurologic: AAOx3 following commands, moves BUE BLE Psychiatric: calm,  cooperative, pleasant  GU: wnl  Resolved Hospital Problem list   Cardiogenic shock  Assessment & Plan:   Hypotension -- -coox 2/7 77.8 on midodrine, doesn't have clinical features of cardiogenic shock -wonder if a little hypovolemic as is NPO, not getting mIVF + side effect of medications -no bloody or tarry stools, obvious bleeding P -will transfer to ICU  -check CVP  -cont NIBP on leg-- last read on RLE is SBP >100  -will start low mIVF (54m/hr x5 hr) -I have dc losartan 2/7 -- will need re-initiation this admission but until BP improves will hold   Acute HFrEF Cardiogenic Shock, improved CI good with milrinone Cardiorenal syndrome, improved  P -HF directed therapies per Adv HF  -in setting of hypotension, hold losartan as above 2/7  Acute hypoxic respiratory failure due to COVID-19 infection, pulmonary edema   -S/p 3 days remdesivir ppx COPD  Tobacco use disorder  P -Cont to wean O2 support as tolerated  -IS -mobility limited until BP / associated dizziness improved  -Incruse/breo -s/p doxycycline x 5 days -PT rec home health   Afib, on eliquis PTA-   P -has been on amio '60mg'$ /hr since 1/30 -- per HF; not sure when this can be decreased  -dig -scheduled for cardioversion 2/7 -- then can go back to PO amio and NoAC after procedure  Subacute GIB- looks to be due to severe esophagitis, CT A/P benign - Continue PPI - no obvious bleeding with eliquis   Hematologic abnormalities: would not worry about in setting of COVID, acute cardiorenal syndrome.  Can recheck diff as OP.  DM2- well controlled on SSI  ICU delirium- resolved, continue ramelteon  ICU maintenance-  -cont CVC -- trending cvp, coox   Best practice:  Diet: NPO for cardioversion  Pain/Anxiety/Delirium protocol (if indicated): not indicated VAP protocol (if indicated): n/a DVT prophylaxis: eliquis  GI prophylaxis: PPI BID Glucose control: as above Mobility: up to chair Code Status: Full Family  Communication: d/w patient 2/7 Disposition: transferring back to ICU 2/7  CRITICAL CARE Performed by: GCristal Generous  Total critical care time: 45  minutes  Critical care time was exclusive of separately billable procedures and treating other patients. Critical care was necessary to treat or prevent imminent or life-threatening deterioration.  Critical care was time spent personally by me on the following activities: development of treatment plan with patient and/or surrogate as well as nursing, discussions with consultants, evaluation of patient's response to treatment, examination of patient, obtaining history from patient or surrogate, ordering and performing treatments and interventions, ordering and review of laboratory studies, ordering and review of radiographic studies, pulse oximetry and re-evaluation of patient's condition.  GEliseo GumMSN, AGACNP-BC [id number] no answer, [id number]/01/2021, 8:26 AM

## 2020-09-04 NOTE — Progress Notes (Signed)
Physical Therapy Treatment Patient Details Name: Perry Hunter MRN: [id number] DOB: 1950/10/24 Today's Date: 09/04/2020    History of Present Illness 70 yo male with pmh dm2, GERD, tobacco abuse, copd presented to osh with worsening sob/doe and edema as well as black stool x1 month. Upon presentation pt was found to have hgb of 6, tachycardic, infiltrate on cxr (noted to be covid + as well). He was grossly volume up with hypotension and hemoccult positive. Echo was done showing LVEF 20-25% with grade 3 diastolic dysfunction and severe RH dysfunction.  Transferred to ICU for hypotension and started on norepinephrine IV on 09/04/20.    PT Comments    Pt more stable on IV BP meds this PM and was able to tolerate EOB and side stepping EOB before returning to supine.  BPs still soft when upright (80s/60s), however, pt asymptomatic, perseverating on his new rectal tube and the discomfort in his bottom from loose stools.  PT will progress pt more aggressively once he has stabilized further.  PT will continue to follow acutely for safe mobility progression.  Follow Up Recommendations  Home health PT;Supervision for mobility/OOB     Equipment Recommendations  Rolling walker with 5" wheels;3in1 (PT)    Recommendations for Other Services       Precautions / Restrictions Precautions Precautions: Fall;Other (comment) Precaution Comments: monitor BP    Mobility  Bed Mobility Overal bed mobility: Needs Assistance Bed Mobility: Supine to Sit;Sit to Supine     Supine to sit: Min assist;HOB elevated Sit to supine: Min assist   General bed mobility comments: Min hand held assist to come up to sitting EOB.  Min assist to ensure trunk control when returning to supine.  Transfers Overall transfer level: Needs assistance Equipment used: 1 person hand held assist Transfers: Sit to/from Stand Sit to Stand: Min assist         General transfer comment: Min assist to stand EOB and take 2 steps  towards [facility] with just one person heavy min hand held assist.  He seemed to get some relief from his bottom pain in standing.  BPs in sitting were 80s/60s, so we did not progress to OOB or stand long.  Ambulation/Gait                 Stairs             Wheelchair Mobility    Modified Rankin (Stroke Patients Only)       Balance Overall balance assessment: Needs assistance Sitting-balance support: Feet supported;Bilateral upper extremity supported Sitting balance-Leahy Scale: Fair Sitting balance - Comments: i believe he was only propping on bil hands to unweight sore bottom, not for balance.     Standing balance-Leahy Scale: Poor Standing balance comment: needs support from PT in standing.                            Cognition Arousal/Alertness: Awake/alert Behavior During Therapy: WFL for tasks assessed/performed Overall Cognitive Status:  (not specifically tested)                                 General Comments: perseverating on his rectal tube that he doesn't like and wants out.      Exercises      General Comments        Pertinent Vitals/Pain Pain Assessment: Faces Faces Pain Scale: Hurts whole lot Pain  Location: bottom (rectal tube) raw Pain Descriptors / Indicators: Grimacing;Guarding Pain Intervention(s): Limited activity within patient's tolerance;Monitored during session;Repositioned    Home Living                      Prior Function            PT Goals (current goals can now be found in the care plan section) Progress towards PT goals: Progressing toward goals    Frequency    Min 3X/week      PT Plan Current plan remains appropriate    Co-evaluation              AM-PAC PT "6 Clicks" Mobility   Outcome Measure  Help needed turning from your back to your side while in a flat bed without using bedrails?: A Little Help needed moving from lying on your back to sitting on the side of a flat  bed without using bedrails?: A Little Help needed moving to and from a bed to a chair (including a wheelchair)?: A Little Help needed standing up from a chair using your arms (e.g., wheelchair or bedside chair)?: A Little Help needed to walk in hospital room?: A Lot Help needed climbing 3-5 steps with a railing? : Total 6 Click Score: 15    End of Session Equipment Utilized During Treatment: Oxygen Activity Tolerance: Patient limited by pain;Other (comment) (limited by low BPs) Patient left: in bed;with call bell/phone within reach Nurse Communication: Mobility status PT Visit Diagnosis: Unsteadiness on feet (R26.81);Muscle weakness (generalized) (M62.81);Other abnormalities of gait and mobility (R26.89)     Time: [id number] PT Time Calculation (min) (ACUTE ONLY): 28 min  Charges:  $Therapeutic Activity: 23-37 mins                    Verdene Lennert, PT, DPT  Acute Rehabilitation [phone] pager [phone]) [phone] office

## 2020-09-04 NOTE — Plan of Care (Incomplete)
Paged regarding QT

## 2020-09-04 NOTE — Progress Notes (Signed)
   09/03/20 2345  Assess: MEWS Score  Temp 98 F (36.7 C)  BP (!) 79/54  Pulse Rate 85  ECG Heart Rate 82  Resp 12  Level of Consciousness Alert  SpO2 99 %  Assess: MEWS Score  MEWS Temp 0  MEWS Systolic 2  MEWS Pulse 0  MEWS RR 1  MEWS LOC 0  MEWS Score 3  MEWS Score Color Yellow  Assess: if the MEWS score is Yellow or Red  Were vital signs taken at a resting state? Yes  Focused Assessment No change from prior assessment  Early Detection of Sepsis Score *See Row Information* Low  MEWS guidelines implemented *See Row Information* Yes  Treat  MEWS Interventions Escalated (See documentation below)  Pain Scale 0-10  Take Vital Signs  Increase Vital Sign Frequency  Yellow: Q 2hr X 2 then Q 4hr X 2, if remains yellow, continue Q 4hrs  Escalate  MEWS: Escalate Yellow: discuss with charge nurse/RN and consider discussing with provider and RRT  Notify: Charge Nurse/RN  Name of Charge Nurse/RN Notified Nikki  Date Charge Nurse/RN Notified 09/04/20  Time Charge Nurse/RN Notified 0010  Document  Progress note created (see row info) Yes

## 2020-09-05 ENCOUNTER — Inpatient Hospital Stay (HOSPITAL_COMMUNITY): Payer: Medicare Other

## 2020-09-05 DIAGNOSIS — R579 Shock, unspecified: Secondary | ICD-10-CM | POA: Diagnosis not present

## 2020-09-05 DIAGNOSIS — R570 Cardiogenic shock: Secondary | ICD-10-CM | POA: Diagnosis not present

## 2020-09-05 DIAGNOSIS — J9601 Acute respiratory failure with hypoxia: Secondary | ICD-10-CM

## 2020-09-05 DIAGNOSIS — I5043 Acute on chronic combined systolic (congestive) and diastolic (congestive) heart failure: Secondary | ICD-10-CM | POA: Diagnosis not present

## 2020-09-05 DIAGNOSIS — K254 Chronic or unspecified gastric ulcer with hemorrhage: Secondary | ICD-10-CM | POA: Diagnosis not present

## 2020-09-05 DIAGNOSIS — I480 Paroxysmal atrial fibrillation: Secondary | ICD-10-CM | POA: Diagnosis not present

## 2020-09-05 LAB — CBC
HCT: 32.6 % — ABNORMAL LOW (ref 39.0–52.0)
Hemoglobin: 9.1 g/dL — ABNORMAL LOW (ref 13.0–17.0)
MCH: 18.7 pg — ABNORMAL LOW (ref 26.0–34.0)
MCHC: 27.9 g/dL — ABNORMAL LOW (ref 30.0–36.0)
MCV: 67.1 fL — ABNORMAL LOW (ref 80.0–100.0)
Platelets: 239 10*3/uL (ref 150–400)
RBC: 4.86 MIL/uL (ref 4.22–5.81)
RDW: 28.7 % — ABNORMAL HIGH (ref 11.5–15.5)
WBC: 11.2 10*3/uL — ABNORMAL HIGH (ref 4.0–10.5)
nRBC: 0.6 % — ABNORMAL HIGH (ref 0.0–0.2)

## 2020-09-05 LAB — BASIC METABOLIC PANEL
Anion gap: 9 (ref 5–15)
BUN: 14 mg/dL (ref 8–23)
CO2: 28 mmol/L (ref 22–32)
Calcium: 8.5 mg/dL — ABNORMAL LOW (ref 8.9–10.3)
Chloride: 93 mmol/L — ABNORMAL LOW (ref 98–111)
Creatinine, Ser: 0.91 mg/dL (ref 0.61–1.24)
GFR, Estimated: >60 mL/min (ref >60)
Glucose, Bld: 123 mg/dL — ABNORMAL HIGH (ref 70–99)
Potassium: 4 mmol/L (ref 3.5–5.1)
Sodium: 130 mmol/L — ABNORMAL LOW (ref 135–145)

## 2020-09-05 LAB — GLUCOSE, CAPILLARY
Glucose-Capillary: 125 mg/dL — ABNORMAL HIGH (ref 70–99)
Glucose-Capillary: 156 mg/dL — ABNORMAL HIGH (ref 70–99)
Glucose-Capillary: 150 mg/dL — ABNORMAL HIGH (ref 70–99)
Glucose-Capillary: 131 mg/dL — ABNORMAL HIGH (ref 70–99)
Glucose-Capillary: 140 mg/dL — ABNORMAL HIGH (ref 70–99)
Glucose-Capillary: 97 mg/dL (ref 70–99)
Glucose-Capillary: 118 mg/dL — ABNORMAL HIGH (ref 70–99)

## 2020-09-05 LAB — COOXEMETRY PANEL
Carboxyhemoglobin: 1.7 % — ABNORMAL HIGH (ref 0.5–1.5)
Methemoglobin: 0.9 % (ref 0.0–1.5)
O2 Saturation: 61.7 %
Total hemoglobin: 9.2 g/dL — ABNORMAL LOW (ref 12.0–16.0)

## 2020-09-05 LAB — PROCALCITONIN: Procalcitonin: <0.1 ng/mL

## 2020-09-05 LAB — MAGNESIUM: Magnesium: 1.9 mg/dL (ref 1.7–2.4)

## 2020-09-05 MED ORDER — SODIUM CHLORIDE 0.9 % IV SOLN
2.0000 g | Freq: Three times a day (TID) | INTRAVENOUS | Status: DC
  Administered 2020-09-05 – 2020-09-06 (×6): 2 g via INTRAVENOUS
  Filled 2020-09-05 (×5): qty 2

## 2020-09-05 MED ORDER — VANCOMYCIN HCL 1750 MG/350ML IV SOLN
1750.0000 mg | INTRAVENOUS | Status: DC
  Administered 2020-09-05: 1750 mg via INTRAVENOUS
  Filled 2020-09-05 (×2): qty 350

## 2020-09-05 MED ORDER — LOPERAMIDE HCL 1 MG/7.5ML PO SUSP
1.0000 mg | ORAL | Status: DC | PRN
  Administered 2020-09-05 (×2): 1 mg via ORAL
  Filled 2020-09-05 (×3): qty 7.5

## 2020-09-05 MED ORDER — MAGNESIUM SULFATE IN D5W 1-5 GM/100ML-% IV SOLN
1.0000 g | Freq: Once | INTRAVENOUS | Status: AC
  Administered 2020-09-05: 1 g via INTRAVENOUS
  Filled 2020-09-05: qty 100

## 2020-09-05 NOTE — Progress Notes (Addendum)
Patient ID: Perry Hunter, male   DOB: 02-Jun-1951, 70 y.o.   MRN: [id number]    Advanced Heart Failure Rounding Note   Subjective:    08/29/20 S/P EGD - gastritis. Recommendations for carafate +IV protonix.    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 Elevated filling pressures with preserved CO.  COVID precautions lifted.    Off of milrinone. Co-ox 62%   Remains in Afib on amio gtt at 30/hr.  HR 70s. On Eliquis. Hbg 9.3>>9.9>>8.9>>9.1  Yesterday morning developed profound hypotension w/ SBP in the 70s and MAPs in the 50s. Transferred back to CCU for NE.   Remains on NE 9. Hypotension initially felt 2/2 over diuresis but no improvement w/ IVF boluses. Off diuretics w/ low CVP, ~4. ? Developing septic shock. PCT pending. Cultures resent. Empiric abx restarted today, Vanc + cefepime. Currently AF. WBC 11.   He is sitting up in bed w/ no complaints.       R/LHC   Prox RCA lesion is 99% stenosed.  Mid LAD to Dist LAD lesion is 40% stenosed.  Prox LAD to Mid LAD lesion is 30% stenosed.  Mid Cx lesion is 100% stenosed.  Dist Cx lesion is 90% stenosed.   Findings:  Ao = 102/65 (80)  LV =  93/25 RA = 14 RV = 52/15 PA = 53/19 (35) PCW = 28 Fick cardiac output/index = 6.0/2.8 PVR = 1.1 WU Ao sat = 93% PA sat = 51%, 54%  Objective:   Weight Range:  Vital Signs:   Temp:  [98 F (36.7 C)-98.7 F (37.1 C)] 98.1 F (36.7 C) (02/08 0700) Pulse Rate:  [43-120] 95 (02/08 0830) Resp:  [13-22] 15 (02/08 0830) BP: (64-117)/(40-89) 88/52 (02/08 0815) SpO2:  [86 %-100 %] 97 % (02/08 0830) Last BM Date: 09/04/20  Weight change: Filed Weights   09/02/20 0500 09/03/20 0400 09/04/20 0345  Weight: 103.4 kg 98.9 kg 105 kg    Intake/Output:   Intake/Output Summary (Last 24 hours) at 09/05/2020 0902 Last data filed at 09/05/2020 0800 Gross per 24 hour  Intake 1591.92 ml  Output 1921 ml  Net -329.08 ml      PHYSICAL EXAM: CVP 3-4  General:  Well appearing elderly WM. No respiratory difficulty HEENT: normal Neck: supple. no JVD. Carotids 2+ bilat; no bruits. No lymphadenopathy or thyromegaly appreciated. Cor: PMI nondisplaced. irregularly irregular rhythm and rate. No rubs, gallops or murmurs. Lungs: slightly course BS bilaterally  Abdomen: soft, nontender, nondistended. No hepatosplenomegaly. No bruits or masses. Good bowel sounds. Extremities: no cyanosis, clubbing, rash, edema Neuro: alert & oriented x 3, cranial nerves grossly intact. moves all 4 extremities w/o difficulty. Affect pleasant.   Telemetry:  Afib rates 70s.  Personally reviewed.     Labs: Basic Metabolic Panel: Recent Labs  Lab 08/30/20 0453 08/31/20 0531 08/31/20 1622 09/01/20 0650 09/02/20 0247 09/03/20 0327 09/04/20 0437 09/05/20 0549  NA 136 133*   < > 132* 133* 131* 130* 130*  K 3.5 3.4*   < > 4.4 4.3 4.0 4.2 4.0  CL 82* 80*  --  82* 81* 88* 92* 93*  CO2 44* 43*  --  41* 41* 35* 30 28  GLUCOSE 122* 106*  --  130* 109* 106* 103* 123*  BUN 24* 22  --  '21 20 18 20 14  '$ CREATININE 1.14 1.01   < > 0.97 0.93 1.03 1.06 0.91  CALCIUM 8.4* 8.0*  --  8.2* 8.4* 8.4* 8.3* 8.5*  MG 2.1 1.7  --   --   --  2.3 2.1 1.9  PHOS 3.0  --   --   --   --   --   --   --    < > = values in this interval not displayed.    Liver Function Tests: Recent Labs  Lab 08/30/20 0453  AST 35  ALT 44  ALKPHOS 72  BILITOT 1.8*  PROT 6.7  ALBUMIN 3.1*   No results for input(s): LIPASE, AMYLASE in the last 168 hours. No results for input(s): AMMONIA in the last 168 hours.  CBC: Recent Labs  Lab 08/30/20 0453 08/31/20 0531 09/01/20 0650 09/02/20 0247 09/03/20 0327 09/04/20 0437 09/05/20 0549  WBC 11.2*   < > 12.5* 11.7* 14.3* 11.4* 11.2*  NEUTROABS 8.5*  --   --   --   --   --   --   HGB 9.0*   < > 9.0* 9.3* 9.9* 8.9* 9.1*  HCT 33.9*   < > 32.4* 34.5* 37.5* 31.7* 32.6*  MCV 66.5*   < > 66.4* 67.5* 67.8* 66.9* 67.1*   PLT 189   < > 211 228 254 236 239   < > = values in this interval not displayed.    Cardiac Enzymes: No results for input(s): CKTOTAL, CKMB, CKMBINDEX, TROPONINI in the last 168 hours.  BNP: BNP (last 3 results) Recent Labs    08/25/20 1452  BNP 527.0*    ProBNP (last 3 results) No results for input(s): PROBNP in the last 8760 hours.    Other results:  Imaging: No results found.   Medications:     Scheduled Medications: . albuterol  2 puff Inhalation Q6H  . apixaban  5 mg Oral BID  . vitamin C  500 mg Oral Daily  . Chlorhexidine Gluconate Cloth  6 each Topical Daily  . dextromethorphan-guaiFENesin  1 tablet Oral BID  . digoxin  0.125 mg Oral Daily  . doxazosin  2 mg Oral Daily  . feeding supplement  237 mL Oral TID BM  . fluticasone furoate-vilanterol  1 puff Inhalation Daily  . folic acid  1 mg Oral Daily  . insulin aspart  0-5 Units Subcutaneous QHS  . insulin aspart  0-9 Units Subcutaneous TID WC  . mouth rinse  15 mL Mouth Rinse BID  . midodrine  10 mg Oral TID WC  . multivitamin with minerals  1 tablet Oral Daily  . pantoprazole (PROTONIX) IV  40 mg Intravenous Q12H  . potassium chloride  40 mEq Oral BID  . ramelteon  8 mg Oral QHS  . rosuvastatin  20 mg Oral Daily  . sodium chloride flush  10-40 mL Intracatheter Q12H  . sodium chloride flush  3 mL Intravenous Q12H  . sucralfate  1 g Oral QID  . thiamine  100 mg Oral Daily  . umeclidinium bromide  1 puff Inhalation Daily  . zinc sulfate  220 mg Oral Daily    Infusions: . sodium chloride Stopped (09/03/20 1317)  . sodium chloride 10 mL/hr at 09/05/20 0800  . amiodarone 30 mg/hr (09/05/20 0800)  . ceFEPime (MAXIPIME) IV    . magnesium sulfate bolus IVPB    . norepinephrine (LEVOPHED) Adult infusion 8 mcg/min (09/05/20 0800)  . vancomycin      PRN Medications: sodium chloride, acetaminophen, chlorpheniramine-HYDROcodone, guaiFENesin-dextromethorphan, ondansetron (ZOFRAN) IV, sodium chloride  flush, sodium chloride flush   Assessment/Plan:    1. 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 Elevated filling pressures with preserved CO (on milrinone).   - Plan for medical management of CAD.  - Off milrinone. Co-ox 62%  - on NE 9 for hypotension ? Developing septic shock  - Continue to hold diuretics given hypotension and low CVP 4 - Hold losartan and Spiro for now - c/w digoxin 0.125 daily   2. Acute hypoxic respiratory failure in setting of COVID PNA /AECOPD - Unvacinated CXR shows RML/RLL opacity.  Requiring 3L of oxygen via .  - Treatment per CCM: has had Remdesivr, off steroids now. Completed 7 days of doxycycline.   - COVID precautions lifted 2/7  3. Persistent AF with RVR - has h/o AF at home. Unclear if this is chronic or not. Given that he is on amio at home suspect not chronic  - Continue amio gtt at 30/hr  - was initially off AC due to GIB. Now on Eliquis. Hgb stable at 9.1.  Follow for recurrent bleeding.  - will need eventual TEE/DC-CV once off NE and clearance of potential infection (w/u pending)    4. GIB/Anemia - On eliquis for afib, presentation hgb 6.1, +FOBT; Ferritin 27, iron 13, folate 10.9.  S/p transfused 3 units of PRBCs -> hgb 8.7.  MCV 63. - S/P EGD 2/1 with gastritis. On protonix + carafate.  - Per GI--> not a candidate for colonoscopy at this time. CT A/P without concerning lesions - he is back on a/c, Eliquis 5 mg bid  - Hgb 9.1 today  - Follow closely for rebleeding.   5. AKI - Likely due to ATN/cardiorenal - Baseline Cr 1.15, peak Cr 2.59 - improved, SCr now 0.91  6. DM2 - per CCM   7. Hyponatremia - Na 130 - hold diuretics for now w/  hypotension   8. Persistent Hypotension  - Hgb stable   - Remains on NE 9 + midodrine 10 tid. Hypotension initially felt 2/2 over diuresis but no improvement w/ IVF boluses. Off diuretics w/ low CVP, ~4. ? Developing septic shock. PCT pending. Cultures resent. Empiric abx restarted today per PCCM, vanc + cefepime   - Hold doxazosin  - continue midodrine  - add TED hoses   Length of Stay: [place], PA-C  9:02 AM   Agree with above.   Remains on NE and midodrine to support BP. Suspect possible sepsis. CX pending. On broad spectrum abx. Remains in AF on IV amio. Co-ox ok  General:  Obese male No resp difficulty HEENT: normal Neck: supple. no JVD. Carotids 2+ bilat; no bruits. No lymphadenopathy or thryomegaly appreciated. Cor: PMI nondisplaced. Irregular rate & rhythm. No rubs, gallops or murmurs. Lungs: coarse  Abdomen: obese soft, nontender, nondistended. No hepatosplenomegaly. No bruits or masses. Good bowel sounds. Extremities: no cyanosis, clubbing, rash, edema Neuro: alert & orientedx3, cranial nerves grossly intact. moves all 4 extremities w/o difficulty. Affect pleasant  Back on NE for possible sepsis. Co-ox ok. Await Bcx. Continue broad spectrum abx. Tolerating amio and Eliquis. Will need TEE and DC-CV once off pressors.   CRITICAL CARE Performed by: Glori Bickers  Total critical care time: 35 minutes  Critical care time was exclusive of separately billable procedures and treating other  patients.  Critical care was necessary to treat or prevent imminent or life-threatening deterioration.  Critical care was time spent personally by me (independent of midlevel providers or residents) on the following activities: development of treatment plan with patient and/or surrogate as well as nursing, discussions with consultants, evaluation of patient's response to treatment, examination of patient, obtaining history from patient or surrogate, ordering and performing  treatments and interventions, ordering and review of laboratory studies, ordering and review of radiographic studies, pulse oximetry and re-evaluation of patient's condition.  Glori Bickers, MD  11:54 AM

## 2020-09-05 NOTE — TOC Benefit Eligibility Note (Signed)
Transition of Care [facility]) Benefit Eligibility Note    Patient Details  Name: Jermar Ferrett MRN: [id number] Date of Birth: July 27, 1951   Medication/Dose: Eliquis  Covered?: Yes     Prescription Coverage Preferred Pharmacy: unable to fill until 3/11 - last filled 1/19 at [facility]  Spoke with Person/Company/Phone Number:: [place]  Co-Pay: $0  Prior Approval: No          Delorse Lek Phone Number: 09/05/2020, 3:13 PM

## 2020-09-05 NOTE — Plan of Care (Signed)

## 2020-09-05 NOTE — Progress Notes (Signed)
NAME:  Perry Hunter, MRN:  [id number], DOB:  12-18-1950, LOS: 69 ADMISSION DATE:  08/25/2020, CONSULTATION DATE:  08/26/20 REFERRING MD:  Dr Manuella Ghazi, CHIEF COMPLAINT:  Chf, gib  Brief History   70 yo male with pmh dm2, GERD, tobacco abuse, copd presented to osh with worsening sob/doe and edema as well as black stool x1 month.   History of present illness   70 yo male with pmh HTN, Hyperlipidemia, T2DM, GERD, tobacco abuse, copd, paf on eliquis who presented to osh at the insistence of his family for weeks of increasing abdominal girth/le edema, sob/doe and black tarry stools for 1 month.   Upon presentation pt was found to have hgb of 6, tachycardic, infiltrate on cxr (noted to be covid + as well). He was grossly volume up with hypotension and hemoccult positive.   Pt was started on neo and uptitrated to >135mg, subsequently transitioned to dopamine after conversation with cardiology by Dr SManuella Ghazi Echo was done showing LVEF 20-25% with grade 3 diastolic dysfunction and severe RH dysfunction with RVSP 44.439mg  GI was consulted for his +hemoccult and recommended endoscopy when pt stabilized. At this time he has received 3u prbc.   Pt arrived to our ICU was alert and oriented x2.  On 5 mcg of dopamine.  Satting in 100s on 4 L.  Denied any overt shortness of breath.  Denied any GI symptoms did endorse melena previously consent was obtained from patient and wife for central line and A-line.   Past Medical History   Past Medical History:  Diagnosis Date  . Arthritis   . Asthma   . CHF (congestive heart failure) ([place]  . Diabetes mellitus without complication ([facility]  . GERD (gastroesophageal reflux disease)   . Hypercholesteremia   . Hypertension    Also with history of A. fib.  [facility]   [facility], losartan started  2/7 back to icu for hypotension  [facility]  [facility]  Procedures:  Central line A  Line  Significant Diagnostic Tests:  1/29 echo: LVEF 20-25% grade III diastolic dysfunction, rh severely dysfunctional.  1/30 abdominal Us>> nothing remarkable 08/31/20 R[facility] Ao = 102/65 (80)  LV =  93/25 RA = 14 RV = 52/15 PA = 53/19 (35) PCW = 28 Fick cardiac output/index = 6.0/2.8 PVR = 1.1 WU Ao sat = 93% PA sat = 51%, 54%  LHC  Prox RCA lesion is 99% stenosed.  Mid LAD to Dist LAD lesion is 40% stenosed.  Prox LAD to Mid LAD lesion is 30% stenosed.  Mid Cx lesion is 100% stenosed.  Dist Cx lesion is 90% stenosed.  Micro Data:  1/28 sars2: positive  Antimicrobials:  N/A  Interim history/subjective:  Denies complaints, remains on norepi 68m32m  Objective   Blood pressure (!) 93/58, pulse 98, temperature 98.1 F (36.7 C), temperature source Oral, resp. rate 16, height '5\' 8"'$  (1.727 m), weight 105 kg, SpO2 91 %. CVP:  [3 mmHg-10 mmHg] 3 mmHg      Intake/Output Summary (Last 24 hours) at 09/05/2020 0819 Last data filed at 09/05/2020 0800 Gross per 24 hour  Intake 1591.92 ml  Output 1921 ml  Net -329.08 ml   Filed Weights   09/02/20 0500 09/03/20 0400 09/04/20 0345  Weight: 103.4 kg 98.9 kg 105 kg    Examination: Constitutional: elderly man laying in bed in NAD HEENT: [place]/AT, eyes anicteric Cardiovascular:S1S2, irreg rhythm Respiratory:  rhonchi bilaterally, no rhales, no tachypnea or accessory muscle use Gastrointestinal: soft, NT Skin: warm, dry. Some bruising on arms. Neurologic: Awake, alert, answering questions appropriately Psychiatric: cooperative with exam GU: foley draining yellow urine  coox 62%  Resolved Hospital Problem list   Cardiogenic shock  Assessment & Plan:   Hypotension, likely due to overdiuresis, no evidence of cardiac decompensation causing this acutely. Coox lower but remains preserved. Concerned about potential unrecognized sepsis with persistent shock -CXR, cultures -checking PCT -start empiric antibiotics for now -con't  norepinephrine to maintain MAP >65 -cont NIBP on leg-- last read on RLE is SBP >100  -losartan already discontinued -con't midodrine  Acute HFrEF Cardiogenic Shock, improved CI ok with milrinone Cardiorenal syndrome, improved  P -HF directed therapies per heart failure team -holding losartan; not tolerating GDMT for CHF at his time.  Acute hypoxic respiratory failure due to COVID-19 infection, acute cardiogenic pulmonary edema   -S/p 3 days remdesivir ppx COPD  Tobacco use disorder  -off isolation precautions for covid -Cont to wean O2 support as tolerated to maintain SpO2 >90% -IS, flutter -mobility limited until BP / associated dizziness improved  -con't daily Incruse & Breo -PT rec home health   Afib, on eliquis PTA -con't amiodarone for rate control -con't digoxin daly -con't apixaban -scheduled for cardioversion 2/7, but did not happen due to hypotension. Had plans to go back to PO amio and NoAC after procedure.  Subacute GIB- looks to be due to severe esophagitis, CT A/P benign - Continue PPI - no obvious bleeding with Eliquis; con't to monitor  Acute anemia, likely due to critical illness Hematologic abnormalities: would not worry about in setting of COVID, acute cardiorenal syndrome.  Can recheck diff as OP. -transfuse for Hb<7 or hemodynamically significant bleeding -con't to monitor  DM2, controlled   -SSI PRN -goal BG 140-180 while admitted  ICU delirium- resolved -continue ramelteon   Best practice:  Diet: NPO for cardioversion  Pain/Anxiety/Delirium protocol (if indicated): not indicated VAP protocol (if indicated): n/a DVT prophylaxis: eliquis  GI prophylaxis: PPI BID Glucose control: as above Mobility: up to chair Code Status: Full Family Communication: d/w patient 2/7 Disposition: transferring back to ICU 2/7  This patient is critically ill with multiple organ system failure which requires frequent high complexity decision making, assessment,  support, evaluation, and titration of therapies. This was completed through the application of advanced monitoring technologies and extensive interpretation of multiple databases. During this encounter critical care time was devoted to patient care services described in this note for 35 minutes.  Julian Hy, DO 09/05/20 8:33 AM [facility]  From 7AM- 7PM if no response to pager, please call [phone]. After hours, 7PM- 7AM, please call Elink  [phone].

## 2020-09-05 NOTE — Progress Notes (Signed)
Physical Therapy Treatment Patient Details Name: Perry Hunter MRN: [id number] DOB: October 24, 1950 Today's Date: 09/05/2020    History of Present Illness 70 yo male with pmh dm2, GERD, tobacco abuse, copd presented to osh with worsening sob/doe and edema as well as black stool x1 month. Upon presentation pt was found to have hgb of 6, tachycardic, infiltrate on cxr (noted to be covid + as well). He was grossly volume up with hypotension and hemoccult positive. Echo was done showing LVEF 20-25% with grade 3 diastolic dysfunction and severe RH dysfunction.  Transferred to ICU for hypotension and started on norepinephrine IV on 09/04/20.    PT Comments    Pt asleep on entry, wakes easily and slightly grumpy but agreeable to getting up. Pt limited in safe mobility with orthostatic hypotension (see Below), as well as decreased strength and endurance. Pt requires min A for bed mobility, transfers and ambulation of 3 feet with RW. Ambulation not progressed due to decreased BP. D/c plans remain appropriate at this time. PT will continue to follow acutely.  Orthostatic BPs  Supine 104/55  Sitting 81/62  Sitting after 3 min 91/58  Standing 69/44  Sitting after 1 min  88/59  Sitting after 5 min  92/64       Follow Up Recommendations  Home health PT;Supervision for mobility/OOB     Equipment Recommendations  Rolling walker with 5" wheels;3in1 (PT)       Precautions / Restrictions Precautions Precautions: Fall;Other (comment) Precaution Comments: monitor BP Restrictions Weight Bearing Restrictions: No    Mobility  Bed Mobility Overal bed mobility: Needs Assistance Bed Mobility: Supine to Sit;Sit to Supine     Supine to sit: Min assist;HOB elevated Sit to supine: Min assist   General bed mobility comments: Min hand held assist to come up to sitting EOB.  min A for squaring hips to bed  Transfers Overall transfer level: Needs assistance Equipment used: Rolling walker (2  wheeled) Transfers: Sit to/from Stand Sit to Stand: Min assist         General transfer comment: good power up, min A for initial steadying in RW, able to maintain balance with RW  Ambulation/Gait Ambulation/Gait assistance: Min assist Gait Distance (Feet): 3 Feet Assistive device: Rolling walker (2 wheeled) Gait Pattern/deviations: Step-to pattern;Decreased step length - right;Decreased step length - left;Shuffle;Trunk flexed Gait velocity: slowed Gait velocity interpretation: <1.31 ft/sec, indicative of household ambulator General Gait Details: minA for steadying with stepping from bed to recliner, once at recliner pt able to maintain standing for 20 sec for BP measurement before sitting in recliner, further ambulation deferred due to decreased BP       Balance Overall balance assessment: Needs assistance Sitting-balance support: Feet supported;Bilateral upper extremity supported Sitting balance-Leahy Scale: Fair     Standing balance support: Bilateral upper extremity supported Standing balance-Leahy Scale: Poor Standing balance comment: needs initial outside support from PT, then able to maintain with UE assist on RW                            Cognition Arousal/Alertness: Awake/alert Behavior During Therapy: [facility] for tasks assessed/performed Overall Cognitive Status: Within Functional Limits for tasks assessed (not specifically tested) Area of Impairment: Problem solving                             Problem Solving: Slow processing;Requires verbal cues;Requires tactile cues General Comments: difficult to determine  if slow processing and increase cuing need due to [facility]      Exercises      General Comments General comments (skin integrity, edema, etc.): Pt with increased orthosasis especially in standing, however pt is completely assymptomatic, SaO2 on 3L O2 via  >92%O2, HR at rest 78bpm , with mobility 112bpm      Pertinent Vitals/Pain Pain  Assessment: Faces Faces Pain Scale: Hurts a little bit Pain Location: generalized Pain Descriptors / Indicators: Grimacing;Guarding Pain Intervention(s): Limited activity within patient's tolerance;Monitored during session;Repositioned           PT Goals (current goals can now be found in the care plan section) Acute Rehab PT Goals PT Goal Formulation: With patient Time For Goal Achievement: 09/14/20 Potential to Achieve Goals: Good Progress towards PT goals: Progressing toward goals    Frequency    Min 3X/week      PT Plan Current plan remains appropriate       AM-PAC PT "6 Clicks" Mobility   Outcome Measure  Help needed turning from your back to your side while in a flat bed without using bedrails?: A Little Help needed moving from lying on your back to sitting on the side of a flat bed without using bedrails?: A Little Help needed moving to and from a bed to a chair (including a wheelchair)?: A Little Help needed standing up from a chair using your arms (e.g., wheelchair or bedside chair)?: A Little Help needed to walk in hospital room?: A Lot Help needed climbing 3-5 steps with a railing? : Total 6 Click Score: 15    End of Session Equipment Utilized During Treatment: Oxygen Activity Tolerance: Other (comment) (limited by low BPs) Patient left: in bed;with call bell/phone within reach Nurse Communication: Mobility status PT Visit Diagnosis: Unsteadiness on feet (R26.81);Muscle weakness (generalized) (M62.81);Other abnormalities of gait and mobility (R26.89)     Time: [id number] PT Time Calculation (min) (ACUTE ONLY): 31 min  Charges:  $Therapeutic Activity: 23-37 mins                     Montgomery Favor B. Migdalia Dk PT, DPT Acute Rehabilitation Services Pager [phone] Office [phone]    [place] 09/05/2020, 2:08 PM

## 2020-09-05 NOTE — Progress Notes (Signed)
Pharmacy Antibiotic Note  Perry Hunter is a 70 y.o. male admitted on 08/25/2020 with COVID-19 and volume overload. Pt completed course of doxycycline for possible superimposed bacterial PNA on 2/4. Pharmacy has been consulted for vancomycin and cefepime dosing now with concern for worsening shock.  Plan: Cefepime 2g IV q8h Vancomycin '1750mg'$  q24h - est AUC 513  Height: '5\' 8"'$  (172.7 cm) Weight: 105 kg (231 lb 7.7 oz) IBW/kg (Calculated) : 68.4  Temp (24hrs), Avg:98.2 F (36.8 C), Min:98 F (36.7 C), Max:98.7 F (37.1 C)  Recent Labs  Lab 09/01/20 0650 09/02/20 0247 09/03/20 0327 09/04/20 0437 09/05/20 0549  WBC 12.5* 11.7* 14.3* 11.4* 11.2*  CREATININE 0.97 0.93 1.03 1.06 0.91    Estimated Creatinine Clearance: 87.4 mL/min (by C-G formula based on SCr of 0.91 mg/dL).    No Known Allergies  Antimicrobials this admission: Doxycycline 1/31 >> 2/4 Vancomycin 2/8 >>  Cefepime 2/8 >>  Microbiology results: pending  Thank you for allowing pharmacy to be a part of this patient's care.  Arrie Senate, PharmD, BCPS, [facility] Clinical Pharmacist [phone] Please check AMION for all Ward numbers 09/05/2020

## 2020-09-05 NOTE — Progress Notes (Signed)
Called Infection prevention spoke with Jiles Garter, RN. Okay to remove COVID precautions at this time.   RN will continue to monitor.

## 2020-09-05 NOTE — Progress Notes (Signed)
Benefits check in process for apixaban '5mg'$  twice a day. Whitman Hero RN,BSN,CM

## 2020-09-06 DIAGNOSIS — A419 Sepsis, unspecified organism: Secondary | ICD-10-CM | POA: Diagnosis not present

## 2020-09-06 DIAGNOSIS — I480 Paroxysmal atrial fibrillation: Secondary | ICD-10-CM

## 2020-09-06 DIAGNOSIS — I5043 Acute on chronic combined systolic (congestive) and diastolic (congestive) heart failure: Secondary | ICD-10-CM | POA: Diagnosis not present

## 2020-09-06 DIAGNOSIS — J42 Unspecified chronic bronchitis: Secondary | ICD-10-CM | POA: Diagnosis not present

## 2020-09-06 DIAGNOSIS — J9601 Acute respiratory failure with hypoxia: Secondary | ICD-10-CM | POA: Diagnosis not present

## 2020-09-06 DIAGNOSIS — K254 Chronic or unspecified gastric ulcer with hemorrhage: Secondary | ICD-10-CM | POA: Diagnosis not present

## 2020-09-06 DIAGNOSIS — R6521 Severe sepsis with septic shock: Secondary | ICD-10-CM

## 2020-09-06 DIAGNOSIS — R570 Cardiogenic shock: Secondary | ICD-10-CM | POA: Diagnosis not present

## 2020-09-06 LAB — BASIC METABOLIC PANEL
Anion gap: 9 (ref 5–15)
BUN: 14 mg/dL (ref 8–23)
CO2: 27 mmol/L (ref 22–32)
Calcium: 8.4 mg/dL — ABNORMAL LOW (ref 8.9–10.3)
Chloride: 96 mmol/L — ABNORMAL LOW (ref 98–111)
Creatinine, Ser: 0.81 mg/dL (ref 0.61–1.24)
GFR, Estimated: >60 mL/min (ref >60)
Glucose, Bld: 89 mg/dL (ref 70–99)
Potassium: 3.8 mmol/L (ref 3.5–5.1)
Sodium: 132 mmol/L — ABNORMAL LOW (ref 135–145)

## 2020-09-06 LAB — CBC
HCT: 32.9 % — ABNORMAL LOW (ref 39.0–52.0)
Hemoglobin: 9.5 g/dL — ABNORMAL LOW (ref 13.0–17.0)
MCH: 19.3 pg — ABNORMAL LOW (ref 26.0–34.0)
MCHC: 28.9 g/dL — ABNORMAL LOW (ref 30.0–36.0)
MCV: 67 fL — ABNORMAL LOW (ref 80.0–100.0)
Platelets: 238 10*3/uL (ref 150–400)
RBC: 4.91 MIL/uL (ref 4.22–5.81)
RDW: 29.2 % — ABNORMAL HIGH (ref 11.5–15.5)
WBC: 8.9 10*3/uL (ref 4.0–10.5)
nRBC: 0.7 % — ABNORMAL HIGH (ref 0.0–0.2)

## 2020-09-06 LAB — GLUCOSE, CAPILLARY
Glucose-Capillary: 103 mg/dL — ABNORMAL HIGH (ref 70–99)
Glucose-Capillary: 146 mg/dL — ABNORMAL HIGH (ref 70–99)
Glucose-Capillary: 127 mg/dL — ABNORMAL HIGH (ref 70–99)
Glucose-Capillary: 107 mg/dL — ABNORMAL HIGH (ref 70–99)

## 2020-09-06 LAB — URINE CULTURE: Culture: NO GROWTH

## 2020-09-06 LAB — COOXEMETRY PANEL
Carboxyhemoglobin: 1.4 % (ref 0.5–1.5)
Methemoglobin: 0.8 % (ref 0.0–1.5)
O2 Saturation: 63.1 %
Total hemoglobin: 9.6 g/dL — ABNORMAL LOW (ref 12.0–16.0)

## 2020-09-06 LAB — PROCALCITONIN: Procalcitonin: <0.1 ng/mL

## 2020-09-06 LAB — MAGNESIUM: Magnesium: 2 mg/dL (ref 1.7–2.4)

## 2020-09-06 MED ORDER — DM-GUAIFENESIN ER 30-600 MG PO TB12: 1.0000 | ORAL_TABLET | Freq: Two times a day (BID) | ORAL | Status: DC | PRN

## 2020-09-06 MED ORDER — POTASSIUM CHLORIDE CRYS ER 20 MEQ PO TBCR
40.0000 meq | EXTENDED_RELEASE_TABLET | Freq: Every day | ORAL | Status: DC
  Administered 2020-09-06 – 2020-09-09 (×4): 40 meq via ORAL
  Filled 2020-09-06 (×4): qty 2

## 2020-09-06 MED ORDER — PROSOURCE PLUS PO LIQD
30.0000 mL | Freq: Three times a day (TID) | ORAL | Status: DC
  Administered 2020-09-06 – 2020-09-09 (×4): 30 mL via ORAL
  Filled 2020-09-06 (×6): qty 30

## 2020-09-06 MED ORDER — BETHANECHOL CHLORIDE 10 MG PO TABS
10.0000 mg | ORAL_TABLET | Freq: Three times a day (TID) | ORAL | Status: DC
  Administered 2020-09-06 – 2020-09-09 (×10): 10 mg via ORAL
  Filled 2020-09-06 (×12): qty 1

## 2020-09-06 MED ORDER — MIDODRINE HCL 5 MG PO TABS
15.0000 mg | ORAL_TABLET | Freq: Three times a day (TID) | ORAL | Status: DC
  Administered 2020-09-06 – 2020-09-09 (×10): 15 mg via ORAL
  Filled 2020-09-06 (×9): qty 3

## 2020-09-06 NOTE — Progress Notes (Signed)
Nutrition Follow Up  DOCUMENTATION CODES:   Not applicable  INTERVENTION:   Liberalize diet but keep fluid restriction    Magic cup TID with meals, each supplement provides 290 kcal and 9 grams of protein  ProSource Plus 30 ml TID, each supplement provides 100 kcals and 15 grams protein.   MVI daily  NUTRITION DIAGNOSIS:   Inadequate oral intake related to decreased appetite as evidenced by meal completion < 50%.  Ongoing  GOAL:   Patient will meet greater than or equal to 90% of their needs   Not meeting  MONITOR:   PO intake,Supplement acceptance,Weight trends,Labs,I & O's,Skin  REASON FOR ASSESSMENT:   Rounds    ASSESSMENT:   Patient with PMH significant for DM, GERD, COPD, HTN, mand CHF. Presents this admission with COVID 19 infection and subacute GI bleed.   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 Having diarrhea. Confused upon RD follow up. Meal intake looks to be minimal. Last three meal completion charted as 15%, 50%, and 15%. Does not take Ensure. Will trial different supplement. If intake does not progress recommend Cortrak placement with EN.   Admission weight: 121.5 kg  Current weight: 99.4 kg   UOP: 1180 ml x 24 hrs   Medications: 500 mg Vitamin C, folic acid, SS novolog, 40 mEq KCl daily, thiamine, 220 mg zinc sulfate Labs: Na 132 (L) CBG 97-140  Diet Order:   Diet Order            Diet NPO time specified  Diet effective midnight           Diet NPO time specified  Diet effective midnight           Diet heart healthy/carb modified Room service appropriate? Yes; Fluid consistency: Thin  Diet effective now                 EDUCATION NEEDS:   Education needs have been addressed  Skin:  Skin Assessment: Skin Integrity Issues: Skin Integrity Issues:: Other (Comment) Other: skin tear-L elbow  Last BM:  2/8  Height:    Ht Readings from Last 1 Encounters:  08/25/20 '5\' 8"'$  (1.727 m)    Weight:   Wt Readings from Last 1 Encounters:  09/06/20 99.4 kg    BMI:  Body mass index is 33.32 kg/m.  Estimated Nutritional Needs:   Kcal:  2300-2500 kcal  Protein:  100-120 grams  Fluid:  1.2 L fluid restriction  Mariana Single RD, LDN Clinical Nutrition Pager listed in [place]

## 2020-09-06 NOTE — Progress Notes (Signed)
NAME:  Perry Hunter, MRN:  [id number], DOB:  12/03/1950, LOS: 12 ADMISSION DATE:  08/25/2020, CONSULTATION DATE:  08/26/20 REFERRING MD:  Dr Manuella Ghazi, CHIEF COMPLAINT:  Chf, gib  Brief History   70 yo male with pmh dm2, GERD, tobacco abuse, copd presented to osh with worsening sob/doe and edema as well as black stool x1 month.   History of present illness   70 yo male with pmh HTN, Hyperlipidemia, T2DM, GERD, tobacco abuse, copd, paf on eliquis who presented to osh at the insistence of his family for weeks of increasing abdominal girth/le edema, sob/doe and black tarry stools for 1 month.   Upon presentation pt was found to have hgb of 6, tachycardic, infiltrate on cxr (noted to be covid + as well). He was grossly volume up with hypotension and hemoccult positive.   Pt was started on neo and uptitrated to >140mg, subsequently transitioned to dopamine after conversation with cardiology by Dr SManuella Ghazi Echo was done showing LVEF 20-25% with grade 3 diastolic dysfunction and severe RH dysfunction with RVSP 44.471mg  GI was consulted for his +hemoccult and recommended endoscopy when pt stabilized. At this time he has received 3u prbc.   Pt arrived to our ICU was alert and oriented x2.  On 5 mcg of dopamine.  Satting in 100s on 4 L.  Denied any overt shortness of breath.  Denied any GI symptoms did endorse melena previously consent was obtained from patient and wife for central line and A-line.   Past Medical History   Past Medical History:  Diagnosis Date  . Arthritis   . Asthma   . CHF (congestive heart failure) ([facility]  . Diabetes mellitus without complication ([facility]  . GERD (gastroesophageal reflux disease)   . Hypercholesteremia   . Hypertension    Also with [facility] fib.  [facility]   [facility], losartan started  2/7 back to icu for hypotension  [facility]  [facility]  Procedures:  Central line A  Line  Significant Diagnostic Tests:  1/29 echo: LVEF 20-25% grade III diastolic dysfunction, rh severely dysfunctional.  1/30 abdominal Us>> nothing remarkable 08/31/20 RHC Ao = 102/65 (80)  LV =  93/25 RA = 14 RV = 52/15 PA = 53/19 (35) PCW = 28 Fick cardiac output/index = 6.0/2.8 PVR = 1.1 WU Ao sat = 93% PA sat = 51%, 54%  LHC  Prox RCA lesion is 99% stenosed.  Mid LAD to Dist LAD lesion is 40% stenosed.  Prox LAD to Mid LAD lesion is 30% stenosed.  Mid Cx lesion is 100% stenosed.  Dist Cx lesion is 90% stenosed.  Micro Data:  1/28 sars2: positive  Antimicrobials:  vanc 2/8 Cefepime 2/8>  Interim history/subjective:  Off pressors since yesterday afternoon. Diarrhea yesterday. He complains of pain in his butt and trouble urinating. I/O cath x 1 overnight.  Objective   Blood pressure (!) 87/64, pulse 99, temperature 97.9 F (36.6 C), temperature source Oral, resp. rate 18, height '5\' 8"'$  (1.727 m), weight 99.4 kg, SpO2 (!) 89 %. CVP:  [2 mmHg-3 mmHg] 2 mmHg      Intake/Output Summary (Last 24 hours) at 09/06/2020 0805 Last data filed at 09/06/2020 0700 Gross per 24 hour  Intake 1684.21 ml  Output 1060 ml  Net 624.21 ml   Filed Weights   09/03/20 0400 09/04/20 0345 09/06/20 0700  Weight: 98.9 kg 105 kg 99.4 kg  Examination: Constitutional: chronically ill appearing man sitting up in the chair in NAD HEENT: [place]/AT, eyes anicteric Cardiovascular:  S1S2, irreg rhythm, reg rate Respiratory: occasional coughing, mild bilateral wheezing, breathing comfortably on [place] Gastrointestinal: obese, soft Skin: warm, dry, no rashes Neurologic: awake, alert, answering questions appropriately, moving extremities Psychiatric: cooperative with exam, questionable accuracy of answers to questions GU: external catheter in place  coox 63% CXR> no opacities  Resolved Hospital Problem list   Cardiogenic shock  Assessment & Plan:   Hypotension, likely due to overdiuresis, no  evidence of cardiac decompensation causing this acutely. Coox lower but remains preserved. Concerned about potential sepsis due to UTI with persistent shock -con't to follow cultures until finalized -stop vanc given normal CXR; con't cefepime -con't to monitor in ICU off pressors -hold diuresis for now -cont NIBP on leg-- last read on RLE is SBP >100  -losartan already discontinued; options for GDMT for HFrEF may be limited. -con't midodrine  Acute HFrEF Cardiogenic Shock, improved CI ok with milrinone Cardiorenal syndrome, improved  Afib P -HF directed therapies per heart failure team- digoxin, amiodarone for Afib -holding losartan  Acute hypoxic respiratory failure due to COVID-19 infection, acute cardiogenic pulmonary edema   -S/p 3 days remdesivir ppx COPD  Tobacco use disorder  -off isolation precautions for covid -Cont to wean O2 support as tolerated to maintain SpO2 >90% -IS, flutter -OOB mobility -con't daily Incruse & Breo; albuterol PRN -PT recommends home health   Afib, on eliquis PTA -con't amiodarone for rate control -con't digoxin daly -con't apixaban -Cardioversion later this week when more stable. Had plans to go back to PO amio and NOAC after procedure.  Subacute GIB- looks to be due to severe esophagitis, CT A/P benign - Continue PPI - No obvious bleeding with Eliquis; con't to monitor.  Acute anemia, likely due to critical illness Hematologic abnormalities: would not worry about in setting of COVID, acute cardiorenal syndrome.  Can recheck diff as OP. -transfuse for Hb<7 or hemodynamically significant bleeding -con't to monitor  DM2, controlled   -SSI PRN-- hasn't been needing -goal BG 140-180 while admitted  ICU delirium- resolved -continue ramelteon -deescalate cough regimen to PRN   Best practice:  Diet: NPO for cardioversion  Pain/Anxiety/Delirium protocol (if indicated): not indicated VAP protocol (if indicated): n/a DVT prophylaxis:  eliquis  GI prophylaxis: PPI BID Glucose control: as above Mobility: up to chair Code Status: Full Family Communication: d/w patient 2/9 Disposition: icu   Julian Hy, DO 09/06/20 8:36 AM [facility]  From [place] if no response to pager, please call [phone]. After hours, 7PM- 7AM, please call Elink  [phone].

## 2020-09-06 NOTE — Progress Notes (Addendum)
Patient ID: Perry Hunter, male   DOB: 07-Feb-1951, 70 y.o.   MRN: [id number]    Advanced Heart Failure Rounding Note   Subjective:    08/29/20 S/P EGD - gastritis. Recommendations for carafate +IV protonix.    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 Elevated filling pressures with preserved CO.  Co-ox stable off milrinone, 63% today.   Remains in Afib on amio gtt at 30/hr.  HR 70s. On Eliquis w/ no signs of re-bleed. Hbg 9.3>>9.9>>8.9>>9.1>>9.5.  Now off NE. SBPs remains in the 80s. Cuff MAP 65.   No signs of acute infection. WBC 11>>8.9. PCT <0.10. BC pending.   CVP low 2.   R/LHC   Prox RCA lesion is 99% stenosed.  Mid LAD to Dist LAD lesion is 40% stenosed.  Prox LAD to Mid LAD lesion is 30% stenosed.  Mid Cx lesion is 100% stenosed.  Dist Cx lesion is 90% stenosed.   Findings:  Ao = 102/65 (80)  LV =  93/25 RA = 14 RV = 52/15 PA = 53/19 (35) PCW = 28 Fick cardiac output/index = 6.0/2.8 PVR = 1.1 WU Ao sat = 93% PA sat = 51%, 54%  Objective:   Weight Range:  Vital Signs:   Temp:  [97.9 F (36.6 C)-98.2 F (36.8 C)] 97.9 F (36.6 C) (02/09 0400) Pulse Rate:  [55-103] 77 (02/09 0700) Resp:  [9-27] 19 (02/09 0700) BP: (71-123)/(46-85) 83/50 (02/09 0700) SpO2:  [89 %-100 %] 97 % (02/09 0700) Last BM Date: 09/05/20  Weight change: Filed Weights   09/02/20 0500 09/03/20 0400 09/04/20 0345  Weight: 103.4 kg 98.9 kg 105 kg    Intake/Output:   Intake/Output Summary (Last 24 hours) at 09/06/2020 [id number] Last data filed at 09/06/2020 0700 Gross per 24 hour  Intake 1742.36 ml  Output 1180 ml  Net 562.36 ml     PHYSICAL EXAM: CVP 2  General:  Fatigue appearing obese, WM No respiratory difficulty HEENT: normal Neck: supple. no JVD. Carotids 2+ bilat; no bruits. No lymphadenopathy or thyromegaly appreciated. Cor: PMI nondisplaced. Irregularly irregular & rhythm and rate. No rubs,  gallops or murmurs. Lungs: clear Abdomen: soft, nontender, nondistended. No hepatosplenomegaly. No bruits or masses. Good bowel sounds. Extremities: no cyanosis, clubbing, rash, edema Neuro: alert & oriented x 3, cranial nerves grossly intact. moves all 4 extremities w/o difficulty. Affect pleasant.   Telemetry:  Afib rates 70s.  Personally reviewed.     Labs: Basic Metabolic Panel: Recent Labs  Lab 08/31/20 0531 08/31/20 1622 09/02/20 0247 09/03/20 0327 09/04/20 0437 09/05/20 0549 09/06/20 0409  NA 133*   < > 133* 131* 130* 130* 132*  K 3.4*   < > 4.3 4.0 4.2 4.0 3.8  CL 80*   < > 81* 88* 92* 93* 96*  CO2 43*   < > 41* 35* '30 28 27  '$ GLUCOSE 106*   < > 109* 106* 103* 123* 89  BUN 22   < > '20 18 20 14 14  '$ CREATININE 1.01   < > 0.93 1.03 1.06 0.91 0.81  CALCIUM 8.0*   < > 8.4* 8.4* 8.3* 8.5* 8.4*  MG 1.7  --   --  2.3 2.1 1.9 2.0   < > = values in this interval not displayed.    Liver Function Tests: No results for input(s): AST, ALT, ALKPHOS, BILITOT, PROT, ALBUMIN in the last 168 hours. No results for input(s): LIPASE, AMYLASE in  the last 168 hours. No results for input(s): AMMONIA in the last 168 hours.  CBC: Recent Labs  Lab 09/02/20 0247 09/03/20 0327 09/04/20 0437 09/05/20 0549 09/06/20 0409  WBC 11.7* 14.3* 11.4* 11.2* 8.9  HGB 9.3* 9.9* 8.9* 9.1* 9.5*  HCT 34.5* 37.5* 31.7* 32.6* 32.9*  MCV 67.5* 67.8* 66.9* 67.1* 67.0*  PLT 228 254 236 239 238    Cardiac Enzymes: No results for input(s): CKTOTAL, CKMB, CKMBINDEX, TROPONINI in the last 168 hours.  BNP: BNP (last 3 results) Recent Labs    08/25/20 1452  BNP 527.0*    ProBNP (last 3 results) No results for input(s): PROBNP in the last 8760 hours.    Other results:  Imaging: DG CHEST PORT 1 VIEW  Result Date: 09/05/2020 CLINICAL DATA:  Hypotension/shock EXAM: PORTABLE CHEST 1 VIEW COMPARISON:  August 31, 2020 FINDINGS: There is no edema or airspace opacity. There is cardiomegaly with  pulmonary vascularity normal. No adenopathy. There is aortic atherosclerosis. Central catheter tip is in the superior vena cava. No pneumothorax. No bone lesions. IMPRESSION: Stable cardiomegaly. No edema or airspace opacity. Central catheter tip in superior vena cava. Aortic Atherosclerosis (ICD10-I70.0). Electronically Signed   By: Lowella Grip III M.D.   On: 09/05/2020 09:40     Medications:     Scheduled Medications: . albuterol  2 puff Inhalation Q6H  . apixaban  5 mg Oral BID  . vitamin C  500 mg Oral Daily  . Chlorhexidine Gluconate Cloth  6 each Topical Daily  . dextromethorphan-guaiFENesin  1 tablet Oral BID  . digoxin  0.125 mg Oral Daily  . feeding supplement  237 mL Oral TID BM  . fluticasone furoate-vilanterol  1 puff Inhalation Daily  . folic acid  1 mg Oral Daily  . insulin aspart  0-5 Units Subcutaneous QHS  . insulin aspart  0-9 Units Subcutaneous TID WC  . mouth rinse  15 mL Mouth Rinse BID  . midodrine  10 mg Oral TID WC  . multivitamin with minerals  1 tablet Oral Daily  . pantoprazole (PROTONIX) IV  40 mg Intravenous Q12H  . potassium chloride  40 mEq Oral BID  . ramelteon  8 mg Oral QHS  . rosuvastatin  20 mg Oral Daily  . sodium chloride flush  10-40 mL Intracatheter Q12H  . sodium chloride flush  3 mL Intravenous Q12H  . sucralfate  1 g Oral QID  . thiamine  100 mg Oral Daily  . umeclidinium bromide  1 puff Inhalation Daily  . zinc sulfate  220 mg Oral Daily    Infusions: . sodium chloride Stopped (09/03/20 1317)  . sodium chloride 10 mL/hr at 09/05/20 0800  . amiodarone 30 mg/hr (09/06/20 0700)  . ceFEPime (MAXIPIME) IV Stopped (09/06/20 [id number])  . norepinephrine (LEVOPHED) Adult infusion Stopped (09/05/20 1730)  . vancomycin Stopped (09/05/20 1431)    PRN Medications: sodium chloride, acetaminophen, chlorpheniramine-HYDROcodone, guaiFENesin-dextromethorphan, loperamide HCl, ondansetron (ZOFRAN) IV, sodium chloride flush, sodium chloride  flush   Assessment/Plan:    1. 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 Elevated filling pressures with preserved CO (on milrinone).  - Plan for medical management of CAD.  - Off milrinone. Co-ox 63%  - Off NE. MAP  65 but SBP still low in the 80s. H/H stable. No signs of sepsis. CVP remains low at 2.  - Continue to hold diuretics given hypotension and low CVP  - Hold losartan and Arlyce Harman for now - c/w digoxin 0.125 daily  - increase midodrine to 15 tid  - w/ persistent hypotension repeat echo to r/o progression of effusion (mild on echo 1/29)   2. Acute hypoxic respiratory failure in setting of COVID PNA /AECOPD - Unvacinated CXR shows RML/RLL opacity.  Requiring 3L of oxygen via [place].  - Treatment per CCM: has had Remdesivr, off steroids now. Completed 7 days of doxycycline.   - COVID precautions lifted 2/7  3. Persistent AF with RVR - has h/o AF at home. Unclear if this is chronic or not. Given that he is on amio at home suspect not chronic  - Continue amio gtt at 30/hr  - was initially off AC due to GIB. Now on Eliquis. Hgb stable at 9.5.  Follow for recurrent bleeding.  - will need TEE/DC-CV, likely tomorrow if BP stabilizes    4. GIB/Anemia - On eliquis for afib, presentation hgb 6.1, +FOBT; Ferritin 27, iron 13, folate 10.9.  S/p transfused 3 units of PRBCs -> hgb 8.7.  MCV 63. - S/P EGD 2/1 with gastritis. On protonix + carafate.  - Per GI--> not a candidate for colonoscopy at this time. CT A/P without concerning lesions - he is back on a/c, Eliquis 5 mg bid  - Hgb 9.5 today  - Follow closely for rebleeding.   5. AKI - Likely due to ATN/cardiorenal - Baseline Cr 1.15, peak Cr  2.59 - improved, SCr now 0.81  6. DM2 - per CCM   7. Hyponatremia - Na 132 - hold diuretics for now w/ hypotension   8. Persistent Hypotension  - Now off NE but SBPs remains in the 80s. Cuff MAP 65.  - No signs of acute infection/sepsis. WBC 8.9. PCT <0.10. BC pending. - H/H stable  - Continue to hold diuretics and BP active meds  - increase midodrine to 15 tid  - TED hoses  - repeat echo to r/o sizable effusion   Length of Stay: [place], PA-C  7:12 AM   Patient seen and examined with the above-signed Advanced Practice Provider and/or Housestaff. I personally reviewed laboratory data, imaging studies and relevant notes. I independently examined the patient and formulated the important aspects of the plan. I have edited the note to reflect any of my changes or salient points. I have personally discussed the plan with the patient and/or family.  Feels better today. Having some diarrhea. Says breathing is better. BP remains soft. Remain in AF.   General:  Sitting in chair No resp difficulty HEENT: normal Neck: supple. no JVD. Carotids 2+ bilat; no bruits. No lymphadenopathy or thryomegaly appreciated. Cor: PMI nondisplaced. Irregular rate & rhythm. No rubs, gallops or murmurs. Lungs: clear Abdomen: soft, nontender, nondistended. No hepatosplenomegaly. No bruits or masses. Good bowel sounds. Extremities: no cyanosis, clubbing, rash, edema Neuro: alert & orientedx3, cranial nerves grossly intact. moves all 4 extremities w/o difficulty. Affect pleasant  Increase midodrine to 15 tid. Continue Eliquis. Hold diuretics today. Possible TEE/DC-CV tomorrow if BP stable.   Glori Bickers, MD  5:01 PM

## 2020-09-06 NOTE — Progress Notes (Signed)
Occupational Therapy Treatment Patient Details Name: Perry Hunter MRN: [id number] DOB: [birth year] Today's Date: 09/06/2020    History of present illness 70 yo male with pmh dm2, GERD, tobacco abuse, copd presented to osh with worsening sob/doe and edema as well as black stool x1 month. Upon presentation pt was found to have hgb of 6, tachycardic, infiltrate on cxr (noted to be covid + as well). He was grossly volume up with hypotension and hemoccult positive. Echo was done showing LVEF 20-25% with grade 3 diastolic dysfunction and severe RH dysfunction.  Transferred to ICU for hypotension and started on norepinephrine IV on 09/04/20.   OT comments  Patient with improved participation this date.  No dizziness or nausea noted.  BP 91/77 sitting EOB.  OT practiced bed mobility, sit to stand, and stand pivot transfers to increase independence with toileting, hygiene and ADL status.  Primary deficit is generalized weakness.  Skilled OT continues to be indicated to maximize ADL and mobility status for eventual return home.  HH can be considered depending on progress.    Follow Up Recommendations  Home health OT    Equipment Recommendations  3 in 1 bedside commode    Recommendations for Other Services      Precautions / Restrictions Precautions Precautions: Fall;Other (comment) Precaution Comments: monitor BP  91/77 with edge of bed sitting. Restrictions Weight Bearing Restrictions: No       Mobility Bed Mobility Overal bed mobility: Needs Assistance Bed Mobility: Supine to Sit;Sit to Supine Rolling: Supervision   Supine to sit: Min guard        Transfers Overall transfer level: Needs assistance   Transfers: Sit to/from Stand;Stand Pivot Transfers Sit to Stand: Supervision Stand pivot transfers: Min guard            Balance   Sitting-balance support: Feet supported;Bilateral upper extremity supported Sitting balance-Leahy Scale: Good     Standing balance support:  Bilateral upper extremity supported Standing balance-Leahy Scale: Poor Standing balance comment: needs RW.  One knee buckle noted with no LOB.                              Vision Patient Visual Report: No change from baseline     Perception     Praxis      Cognition Arousal/Alertness: Awake/alert Behavior During Therapy: WFL for tasks assessed/performed Overall Cognitive Status: Within Functional Limits for tasks assessed                                 General Comments: more alert and talkative today.        Exercises     Shoulder Instructions       General Comments  HR to 101 with initial stand.  O2 sats at 100% on 2L via [place]    Pertinent Vitals/ Pain       Pain Assessment: No/denies pain Pain Intervention(s): Monitored during session                                                          Frequency  Min 2X/week        Progress Toward Goals  OT Goals(current goals can now be found in the  care plan section)  Progress towards OT goals: Progressing toward goals  Acute Rehab OT Goals Patient Stated Goal: I need to be able to go up my 4 stairs. OT Goal Formulation: With patient Time For Goal Achievement: 09/15/20 Potential to Achieve Goals: [place]  Plan Discharge plan remains appropriate    Co-evaluation                 AM-PAC OT "6 Clicks" Daily Activity     Outcome Measure   Help from another person eating meals?: None Help from another person taking care of personal grooming?: A Little Help from another person toileting, which includes using toliet, bedpan, or urinal?: A Little Help from another person bathing (including washing, rinsing, drying)?: A Lot Help from another person to put on and taking off regular upper body clothing?: A Little Help from another person to put on and taking off regular lower body clothing?: A Lot 6 Click Score: 17    End of Session Equipment Utilized During  Treatment: Rolling walker;Oxygen  OT Visit Diagnosis: Unsteadiness on feet (R26.81);Muscle weakness (generalized) (M62.81)   Activity Tolerance Patient tolerated treatment well   Patient Left in bed;with call bell/phone within reach   Nurse Communication Mobility status        Time: 1533-1550 OT Time Calculation (min): 17 min  Charges: OT General Charges $OT Visit: 1 Visit OT Treatments $Self Care/Home Management : 8-22 mins  09/06/2020  Rich, OTR/L  Acute Rehabilitation Services  Office:  [place] 09/06/2020, 3:59 PM

## 2020-09-06 NOTE — Progress Notes (Addendum)
Pt have a lot of pain and difficulty urinating. BSC x2 without success, pt straining. Bladder scan revealed 350cc urine. Foley order placed by Dr. Carlis Abbott 2/8. Urinary retention medication ordered 2/9 and administered. Foley placed by this RN and Sweta, NT. Pt tolerated procedure well. 400cc of amber urine returned. Pt stated that he felt "better".  RN will continue to monitor.

## 2020-09-06 NOTE — Anesthesia Preprocedure Evaluation (Addendum)
Anesthesia Evaluation  Patient identified by MRN, date of birth, ID band Patient awake    Reviewed: Allergy & Precautions, NPO status , Patient's Chart, lab work & pertinent test results, reviewed documented beta blocker date and time   History of Anesthesia Complications Negative for: history of anesthetic complications  Airway Mallampati: II  TM Distance: >3 FB Neck ROM: Full    Dental  (+) Edentulous Upper, Edentulous Lower   Pulmonary COPD,  COPD inhaler, Current SmokerPatient did not abstain from smoking.,  08/25/2020 SARS coronavirus POS: remdesivir, COVID precautions lifted 2/7   breath sounds clear to auscultation       Cardiovascular hypertension (on pressors now for septic shock), Pt. on medications and Pt. on home beta blockers (-) angina+ CAD (non-obstructive) and +CHF  + dysrhythmias Atrial Fibrillation  Rhythm:Regular Rate:Normal  08/31/2020 Cath: 1. Left dominant system with non-obstructive CAD in LAD and total (versus subtotal) occlusion in mid LCx 2. Elevated filling pressures with preserved CO  09/05/2020  Remains on NE and midodrine to support BP. Suspect possible sepsis   Neuro/Psych negative neurological ROS     GI/Hepatic Neg liver ROS, GERD  Medicated and Controlled,  Endo/Other  diabetes, Insulin Dependent, Oral Hypoglycemic AgentsMorbid obesity  Renal/GU Renal InsufficiencyRenal disease     Musculoskeletal  (+) Arthritis ,   Abdominal (+) + obese,   Peds  Hematology  (+) Blood dyscrasia (Hb 9.5), anemia , eliquis   Anesthesia Other Findings   Reproductive/Obstetrics                            Anesthesia Physical Anesthesia Plan  ASA: IV  Anesthesia Plan: General and MAC   Post-op Pain Management:    Induction: Intravenous  PONV Risk Score and Plan: 1 and Treatment may vary due to age or medical condition  Airway Management Planned: Natural Airway and  Mask  Additional Equipment: None  Intra-op Plan:   Post-operative Plan:   Informed Consent: I have reviewed the patients History and Physical, chart, labs and discussed the procedure including the risks, benefits and alternatives for the proposed anesthesia with the patient or authorized representative who has indicated his/her understanding and acceptance.       Plan Discussed with: CRNA and Surgeon  Anesthesia Plan Comments: (Plan routine monitors, MAC with GA if proceed to cardioversion)      Anesthesia Quick Evaluation

## 2020-09-07 ENCOUNTER — Inpatient Hospital Stay (HOSPITAL_COMMUNITY): Payer: Medicare Other

## 2020-09-07 ENCOUNTER — Encounter (HOSPITAL_COMMUNITY): Payer: Self-pay | Admitting: Cardiology

## 2020-09-07 ENCOUNTER — Inpatient Hospital Stay (HOSPITAL_COMMUNITY): Payer: Medicare Other | Admitting: Anesthesiology

## 2020-09-07 ENCOUNTER — Encounter (HOSPITAL_COMMUNITY)
Admission: EM | Disposition: A | Discharge status: Home Health | Payer: Self-pay | Source: Home / Self Care | Attending: Internal Medicine

## 2020-09-07 DIAGNOSIS — I313 Pericardial effusion (noninflammatory): Secondary | ICD-10-CM

## 2020-09-07 DIAGNOSIS — J42 Unspecified chronic bronchitis: Secondary | ICD-10-CM | POA: Diagnosis not present

## 2020-09-07 DIAGNOSIS — I4819 Other persistent atrial fibrillation: Secondary | ICD-10-CM | POA: Diagnosis not present

## 2020-09-07 DIAGNOSIS — E871 Hypo-osmolality and hyponatremia: Secondary | ICD-10-CM | POA: Diagnosis not present

## 2020-09-07 DIAGNOSIS — D649 Anemia, unspecified: Secondary | ICD-10-CM | POA: Diagnosis not present

## 2020-09-07 DIAGNOSIS — A419 Sepsis, unspecified organism: Secondary | ICD-10-CM | POA: Diagnosis not present

## 2020-09-07 DIAGNOSIS — J9601 Acute respiratory failure with hypoxia: Secondary | ICD-10-CM | POA: Diagnosis not present

## 2020-09-07 DIAGNOSIS — I480 Paroxysmal atrial fibrillation: Secondary | ICD-10-CM

## 2020-09-07 DIAGNOSIS — K254 Chronic or unspecified gastric ulcer with hemorrhage: Secondary | ICD-10-CM | POA: Diagnosis not present

## 2020-09-07 DIAGNOSIS — I5043 Acute on chronic combined systolic (congestive) and diastolic (congestive) heart failure: Secondary | ICD-10-CM | POA: Diagnosis not present

## 2020-09-07 HISTORY — PX: CARDIOVERSION: SHX1299

## 2020-09-07 HISTORY — PX: TEE WITHOUT CARDIOVERSION: SHX5443

## 2020-09-07 LAB — CBC
HCT: 34.4 % — ABNORMAL LOW (ref 39.0–52.0)
Hemoglobin: 9.3 g/dL — ABNORMAL LOW (ref 13.0–17.0)
MCH: 18.2 pg — ABNORMAL LOW (ref 26.0–34.0)
MCHC: 27 g/dL — ABNORMAL LOW (ref 30.0–36.0)
MCV: 67.2 fL — ABNORMAL LOW (ref 80.0–100.0)
Platelets: 244 10*3/uL (ref 150–400)
RBC: 5.12 MIL/uL (ref 4.22–5.81)
RDW: 28.8 % — ABNORMAL HIGH (ref 11.5–15.5)
WBC: 9.7 10*3/uL (ref 4.0–10.5)
nRBC: 0.7 % — ABNORMAL HIGH (ref 0.0–0.2)

## 2020-09-07 LAB — GLUCOSE, CAPILLARY
Glucose-Capillary: 110 mg/dL — ABNORMAL HIGH (ref 70–99)
Glucose-Capillary: 211 mg/dL — ABNORMAL HIGH (ref 70–99)
Glucose-Capillary: 173 mg/dL — ABNORMAL HIGH (ref 70–99)
Glucose-Capillary: 92 mg/dL (ref 70–99)

## 2020-09-07 LAB — BASIC METABOLIC PANEL
Anion gap: 8 (ref 5–15)
BUN: 17 mg/dL (ref 8–23)
CO2: 26 mmol/L (ref 22–32)
Calcium: 8.4 mg/dL — ABNORMAL LOW (ref 8.9–10.3)
Chloride: 97 mmol/L — ABNORMAL LOW (ref 98–111)
Creatinine, Ser: 0.91 mg/dL (ref 0.61–1.24)
GFR, Estimated: >60 mL/min (ref >60)
Glucose, Bld: 218 mg/dL — ABNORMAL HIGH (ref 70–99)
Potassium: 4.2 mmol/L (ref 3.5–5.1)
Sodium: 131 mmol/L — ABNORMAL LOW (ref 135–145)

## 2020-09-07 LAB — COOXEMETRY PANEL
Carboxyhemoglobin: 1.5 % (ref 0.5–1.5)
Methemoglobin: 0.6 % (ref 0.0–1.5)
O2 Saturation: 69 %
Total hemoglobin: 9.6 g/dL — ABNORMAL LOW (ref 12.0–16.0)

## 2020-09-07 LAB — DIGOXIN LEVEL: Digoxin Level: 0.9 ng/mL (ref 0.8–2.0)

## 2020-09-07 LAB — MAGNESIUM: Magnesium: 1.6 mg/dL — ABNORMAL LOW (ref 1.7–2.4)

## 2020-09-07 SURGERY — ECHOCARDIOGRAM, TRANSESOPHAGEAL
Anesthesia: General

## 2020-09-07 MED ORDER — PROPOFOL 500 MG/50ML IV EMUL
INTRAVENOUS | Status: DC | PRN
  Administered 2020-09-07: 125 ug/kg/min via INTRAVENOUS

## 2020-09-07 MED ORDER — DIGOXIN 125 MCG PO TABS
0.0625 mg | ORAL_TABLET | Freq: Every day | ORAL | Status: DC
  Administered 2020-09-08 – 2020-09-09 (×2): 0.0625 mg via ORAL
  Filled 2020-09-07 (×2): qty 1

## 2020-09-07 MED ORDER — SODIUM CHLORIDE 0.9 % IV SOLN: INTRAVENOUS | Status: DC

## 2020-09-07 MED ORDER — PHENYLEPHRINE HCL (PRESSORS) 10 MG/ML IV SOLN
INTRAVENOUS | Status: DC | PRN
  Administered 2020-09-07: 80 ug via INTRAVENOUS

## 2020-09-07 MED ORDER — PROPOFOL 10 MG/ML IV BOLUS
INTRAVENOUS | Status: DC | PRN
Start: 2020-09-07 — End: 2020-09-07
  Administered 2020-09-07 (×2): 20 mg via INTRAVENOUS
  Administered 2020-09-07: 10 mg via INTRAVENOUS

## 2020-09-07 MED ORDER — LACTATED RINGERS IV SOLN: INTRAVENOUS | Status: DC | PRN

## 2020-09-07 NOTE — H&P (View-Only) (Signed)
Patient ID: Perry Hunter, male   DOB: 11/26/1950, 70 y.o.   MRN: [id number]    Advanced Heart Failure Rounding Note   Subjective:    08/29/20 S/P EGD - gastritis. Recommendations for carafate +IV protonix.    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 Elevated filling pressures with preserved CO.  Feels ok. Denies CP or SOB. Remains in AF in 80-90s on IV amio. SBP 110 range. Sats 95% on 3L. No labs yet today.    R/LHC   Prox RCA lesion is 99% stenosed.  Mid LAD to Dist LAD lesion is 40% stenosed.  Prox LAD to Mid LAD lesion is 30% stenosed.  Mid Cx lesion is 100% stenosed.  Dist Cx lesion is 90% stenosed.   Findings:  Ao = 102/65 (80)  LV =  93/25 RA = 14 RV = 52/15 PA = 53/19 (35) PCW = 28 Fick cardiac output/index = 6.0/2.8 PVR = 1.1 WU Ao sat = 93% PA sat = 51%, 54%  Objective:   Weight Range:  Vital Signs:   Temp:  [97.9 F (36.6 C)-98.9 F (37.2 C)] 98.3 F (36.8 C) (02/10 0725) Pulse Rate:  [67-108] 85 (02/10 0725) Resp:  [14-21] 20 (02/10 0725) BP: (84-140)/(49-90) 138/63 (02/10 0725) SpO2:  [91 %-100 %] 100 % (02/10 0725) Weight:  [99.9 kg] 99.9 kg (02/10 0725) Last BM Date: 09/06/20  Weight change: Filed Weights   09/06/20 0700 09/07/20 0600 09/07/20 0725  Weight: 99.4 kg 99.9 kg 99.9 kg    Intake/Output:   Intake/Output Summary (Last 24 hours) at 09/07/2020 0818 Last data filed at 09/07/2020 0600 Gross per 24 hour  Intake 636.49 ml  Output 1025 ml  Net -388.51 ml     PHYSICAL EXAM: CVP 7 General:  Lying in bed . No resp difficulty HEENT: normal Neck: supple. no JVD. Carotids 2+ bilat; no bruits. No lymphadenopathy or thryomegaly appreciated. Cor: PMI nondisplaced. Irregular rate & rhythm. No rubs, gallops or murmurs. Lungs: clear Abdomen: obesesoft, nontender, nondistended. No hepatosplenomegaly. No bruits or masses. Good bowel sounds. Extremities: no  cyanosis, clubbing, rash, edema Neuro: alert & orientedx3, cranial nerves grossly intact. moves all 4 extremities w/o difficulty. Affect pleasant  Telemetry:  Afib rates 80s. Personally reviewed   Labs: Basic Metabolic Panel: Recent Labs  Lab 09/02/20 0247 09/03/20 0327 09/04/20 0437 09/05/20 0549 09/06/20 0409  NA 133* 131* 130* 130* 132*  K 4.3 4.0 4.2 4.0 3.8  CL 81* 88* 92* 93* 96*  CO2 41* 35* '30 28 27  '$ GLUCOSE 109* 106* 103* 123* 89  BUN '20 18 20 14 14  '$ CREATININE 0.93 1.03 1.06 0.91 0.81  CALCIUM 8.4* 8.4* 8.3* 8.5* 8.4*  MG  --  2.3 2.1 1.9 2.0    Liver Function Tests: No results for input(s): AST, ALT, ALKPHOS, BILITOT, PROT, ALBUMIN in the last 168 hours. No results for input(s): LIPASE, AMYLASE in the last 168 hours. No results for input(s): AMMONIA in the last 168 hours.  CBC: Recent Labs  Lab 09/02/20 0247 09/03/20 0327 09/04/20 0437 09/05/20 0549 09/06/20 0409  WBC 11.7* 14.3* 11.4* 11.2* 8.9  HGB 9.3* 9.9* 8.9* 9.1* 9.5*  HCT 34.5* 37.5* 31.7* 32.6* 32.9*  MCV 67.5* 67.8* 66.9* 67.1* 67.0*  PLT 228 254 236 239 238    Cardiac Enzymes: No results for input(s): CKTOTAL, CKMB, CKMBINDEX, TROPONINI in the last 168 hours.  BNP: BNP (last 3 results) Recent Labs  08/25/20 1452  BNP 527.0*    ProBNP (last 3 results) No results for input(s): PROBNP in the last 8760 hours.    Other results:  Imaging: DG CHEST PORT 1 VIEW  Result Date: 09/05/2020 CLINICAL DATA:  Hypotension/shock EXAM: PORTABLE CHEST 1 VIEW COMPARISON:  August 31, 2020 FINDINGS: There is no edema or airspace opacity. There is cardiomegaly with pulmonary vascularity normal. No adenopathy. There is aortic atherosclerosis. Central catheter tip is in the superior vena cava. No pneumothorax. No bone lesions. IMPRESSION: Stable cardiomegaly. No edema or airspace opacity. Central catheter tip in superior vena cava. Aortic Atherosclerosis (ICD10-I70.0). Electronically Signed   By:  Lowella Grip III M.D.   On: 09/05/2020 09:40     Medications:     Scheduled Medications: . [MAR Hold] (feeding supplement) PROSource Plus  30 mL Oral TID BM  . [MAR Hold] albuterol  2 puff Inhalation Q6H  . [MAR Hold] apixaban  5 mg Oral BID  . [MAR Hold] vitamin C  500 mg Oral Daily  . [MAR Hold] bethanechol  10 mg Oral TID  . [MAR Hold] Chlorhexidine Gluconate Cloth  6 each Topical Daily  . [MAR Hold] digoxin  0.125 mg Oral Daily  . [MAR Hold] fluticasone furoate-vilanterol  1 puff Inhalation Daily  . [MAR Hold] folic acid  1 mg Oral Daily  . [MAR Hold] insulin aspart  0-5 Units Subcutaneous QHS  . [MAR Hold] insulin aspart  0-9 Units Subcutaneous TID WC  . [MAR Hold] mouth rinse  15 mL Mouth Rinse BID  . [MAR Hold] midodrine  15 mg Oral TID WC  . [MAR Hold] multivitamin with minerals  1 tablet Oral Daily  . [MAR Hold] pantoprazole (PROTONIX) IV  40 mg Intravenous Q12H  . [MAR Hold] potassium chloride  40 mEq Oral Daily  . [MAR Hold] ramelteon  8 mg Oral QHS  . [MAR Hold] rosuvastatin  20 mg Oral Daily  . [MAR Hold] sodium chloride flush  10-40 mL Intracatheter Q12H  . [MAR Hold] sodium chloride flush  3 mL Intravenous Q12H  . [MAR Hold] sucralfate  1 g Oral QID  . [MAR Hold] thiamine  100 mg Oral Daily  . [MAR Hold] umeclidinium bromide  1 puff Inhalation Daily  . [MAR Hold] zinc sulfate  220 mg Oral Daily    Infusions: . sodium chloride Stopped (09/03/20 1317)  . [MAR Hold] sodium chloride 10 mL/hr at 09/05/20 0800  . sodium chloride    . amiodarone 30 mg/hr (09/07/20 0600)  . [MAR Hold] ceFEPime (MAXIPIME) IV Stopped (09/06/20 2242)    PRN Medications: [MAR Hold] sodium chloride, [MAR Hold] acetaminophen, [MAR Hold] dextromethorphan-guaiFENesin, [MAR Hold] loperamide HCl, [MAR Hold] ondansetron (ZOFRAN) IV, [MAR Hold] sodium chloride flush, [MAR Hold] sodium chloride flush   Assessment/Plan:    1. 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 Elevated filling pressures with preserved CO (on milrinone).  - Plan for medical management of CAD.  - Off milrinone. No labs yet today.  - Hold diuretics this am. Can restart after DC-CV - Hold losartan and Arlyce Harman for now with need for midodrine - c/w digoxin 0.125 daily  - Continue midodrine 15 tid. Wean as toelrated    2.  Acute hypoxic respiratory failure in setting of COVID PNA /AECOPD - Unvacinated CXR shows RML/RLL opacity.  Requiring 3L of oxygen via [place].  - Treatment per CCM: has had Remdesivr, off steroids now. Completed 7 days of doxycycline.   - COVID precautions lifted 2/7 - much improved  3. Persistent AF with RVR - has h/o AF at home. Unclear if this is chronic or not. Given that he is on amio at home suspect not chronic  - Continue amio gtt at 30/hr  - was initially off AC due to GIB. Now on Eliquis. Hgb stable at 9.5.  Follow for recurrent bleeding.  - TE/DC-CV today   4. GIB/Anemia - On eliquis for afib, presentation hgb 6.1, +FOBT; Ferritin 27, iron 13, folate 10.9.  S/p transfused 3 units of PRBCs -> hgb 8.7.  MCV 63. - S/P EGD 2/1 with gastritis. On protonix + carafate.  - Per GI--> not a candidate for colonoscopy at this time. CT A/P without concerning lesions - he is back on a/c, Eliquis 5 mg bid  - Hgb stable at 9.5 - Follow closely for rebleeding.   5. AKI - Likely due to ATN/cardiorenal - Baseline Cr 1.15, peak Cr 2.59 - improved, SCr no 0.8   6. DM2 - per CCM   7. Hyponatremia - improved  8. Persistent Hypotension  - Now off NE but SBPs remains in the 80s. Cuff MAP 65.  - No signs of acute infection/sepsis. WBC 8.9. PCT <0.10. BC pending. - H/H stable  - BP  improved on midodrine 15 tid. -> wean as tolerated - Continue to hold diuretics and BP active meds  - TED hoses   Length of Stay: 13  Glori Bickers, MD  8:18 AM

## 2020-09-07 NOTE — Interval H&P Note (Signed)
History and Physical Interval Note:  09/07/2020 8:25 AM  Perry Hunter  has presented today for surgery, with the diagnosis of afib.  The various methods of treatment have been discussed with the patient and family. After consideration of risks, benefits and other options for treatment, the patient has consented to  Procedure(s): TRANSESOPHAGEAL ECHOCARDIOGRAM (TEE) (N/A) CARDIOVERSION (N/A) as a surgical intervention.  The patient's history has been reviewed, patient examined, no change in status, stable for surgery.  I have reviewed the patient's chart and labs.  Questions were answered to the patient's satisfaction.     Daniel Bensimhon

## 2020-09-07 NOTE — Progress Notes (Signed)
Physical Therapy Treatment Patient Details Name: Perry Hunter MRN: [id number] DOB: [birth year] Today's Date: 09/07/2020    History of Present Illness 70 yo male with pmh dm2, GERD, tobacco abuse, copd presented to osh with worsening sob/doe and edema as well as black stool x1 month. Upon presentation pt was found to have hgb of 6, tachycardic, infiltrate on cxr (noted to be covid + as well). He was grossly volume up with hypotension and hemoccult positive. Echo was done showing LVEF 20-25% with grade 3 diastolic dysfunction and severe RH dysfunction.  Transferred to ICU for hypotension and started on norepinephrine IV on 09/04/20.    PT Comments    Pt reports disappointment with failure of cardioversion and reports his bottom is very sore from sitting up in chair. Pt agreeable to any activity that will get him out of the recliner. Pt is limited in safe mobility by pain in presence of decreased strength and endurance. BP has improved to 118/66 in standing making ambulation appropriate. Pt is min A for transfers to standing with RW. Pt ambulation initially requires min A however quickly progress to mod A and need for seated rest break due to decreased LE strength and buckling knees. After sitting for 3 min pt agreeable to get back to bed requiring mod A for support. Pt requires modA for returning to supine. D/c plans to be reassessed in next session due to increased assist needed. PT will continue to follow acutely to improve strength and activity tolerance.    Follow Up Recommendations  Home health PT;Supervision for mobility/OOB     Equipment Recommendations  Rolling walker with 5" wheels;3in1 (PT)       Precautions / Restrictions Precautions Precautions: Fall;Other (comment) Precaution Comments: monitor BP Restrictions Weight Bearing Restrictions: No    Mobility  Bed Mobility Overal bed mobility: Needs Assistance Bed Mobility: Sit to Supine       Sit to supine: Mod assist   General  bed mobility comments: modA for returning LE to bed due to fatigue from walking. assist of 2 to get up in bed.    Transfers Overall transfer level: Needs assistance Equipment used: Rolling walker (2 wheeled) Transfers: Sit to/from Stand Sit to Stand: Min assist         General transfer comment: good power up, min A for initial steadying in RW, able to maintain balance with RW, min A for power up from armless chair needed for rest break  Ambulation/Gait Ambulation/Gait assistance: Mod assist;Min assist Gait Distance (Feet): 12 Feet Assistive device: Rolling walker (2 wheeled) Gait Pattern/deviations: Step-to pattern;Decreased step length - right;Decreased step length - left;Shuffle;Trunk flexed Gait velocity: slowed   General Gait Details: min A initially but progresses to [place] with bilateral knee buckling requiring seated rest break, modA for 8 feet ambulation back to bed       Balance Overall balance assessment: Needs assistance Sitting-balance support: Feet supported;Bilateral upper extremity supported Sitting balance-Leahy Scale: Fair     Standing balance support: Bilateral upper extremity supported Standing balance-Leahy Scale: Poor Standing balance comment: needs initial outside support from PT, then able to maintain with UE assist on RW                            Cognition Arousal/Alertness: Awake/alert Behavior During Therapy: [facility] for tasks assessed/performed Overall Cognitive Status: Impaired/Different from baseline (not specifically tested) Area of Impairment: Problem solving;Orientation  Orientation Level: Place (doesn't ralize he is at [facility])           Problem Solving: Slow processing;Requires verbal cues;Requires tactile cues General Comments: difficult      Exercises      General Comments General comments (skin integrity, edema, etc.): Pt on 3L O2 via [place] throughout session dropped to 80s with poor pleth wave  form with return to bed SaO2 95%O2, BP in standing 118/66      Pertinent Vitals/Pain Pain Assessment: 0-10 Faces Pain Scale: Hurts worst Pain Location: bottom with sitting, and penis with urge to urinate with Foley Pain Descriptors / Indicators: Grimacing;Guarding Pain Intervention(s): Limited activity within patient's tolerance;Monitored during session;Repositioned           PT Goals (current goals can now be found in the care plan section) Acute Rehab PT Goals PT Goal Formulation: With patient Time For Goal Achievement: 09/14/20 Potential to Achieve Goals: Good Progress towards PT goals: Progressing toward goals    Frequency    Min 3X/week      PT Plan Current plan remains appropriate       AM-PAC PT "6 Clicks" Mobility   Outcome Measure  Help needed turning from your back to your side while in a flat bed without using bedrails?: A Little Help needed moving from lying on your back to sitting on the side of a flat bed without using bedrails?: A Little Help needed moving to and from a bed to a chair (including a wheelchair)?: A Little Help needed standing up from a chair using your arms (e.g., wheelchair or bedside chair)?: A Little Help needed to walk in hospital room?: A Lot Help needed climbing 3-5 steps with a railing? : Total 6 Click Score: 15    End of Session Equipment Utilized During Treatment: Oxygen Activity Tolerance: Patient tolerated treatment well Patient left: in bed;with call bell/phone within reach Nurse Communication: Mobility status PT Visit Diagnosis: Unsteadiness on feet (R26.81);Muscle weakness (generalized) (M62.81);Other abnormalities of gait and mobility (R26.89)     Time: [id number] PT Time Calculation (min) (ACUTE ONLY): 30 min  Charges:  $Gait Training: 8-22 mins $Therapeutic Activity: 8-22 mins                     Laryah Neuser B. Migdalia Dk PT, DPT Acute Rehabilitation Services Pager [phone] Office [phone]    [place] 09/07/2020, 1:37 PM

## 2020-09-07 NOTE — Anesthesia Postprocedure Evaluation (Signed)
Anesthesia Post Note  Patient: Perry Hunter  Procedure(s) Performed: TRANSESOPHAGEAL ECHOCARDIOGRAM (TEE) (N/A ) CARDIOVERSION (N/A )     Patient location during evaluation: Endoscopy Anesthesia Type: General Level of consciousness: awake and alert, awake and oriented Pain management: pain level controlled Vital Signs Assessment: post-procedure vital signs reviewed and stable Respiratory status: nonlabored ventilation, spontaneous breathing, respiratory function stable and patient connected to nasal cannula oxygen Cardiovascular status: blood pressure returned to baseline and stable Postop Assessment: no apparent nausea or vomiting Anesthetic complications: no   No complications documented.  Last Vitals:  Vitals:   09/07/20 1000 09/07/20 1117  BP: 109/69   Pulse: 82   Resp: (!) 21   Temp:  36.9 C  SpO2: 97%     Last Pain:  Vitals:   09/07/20 1117  TempSrc: Oral  PainSc:                  Shaden Lacher,E. Shagun Wordell

## 2020-09-07 NOTE — Transfer of Care (Signed)
Immediate Anesthesia Transfer of Care Note  Patient: Perry Hunter  Procedure(s) Performed: TRANSESOPHAGEAL ECHOCARDIOGRAM (TEE) (N/A ) CARDIOVERSION (N/A )  Patient Location: Endoscopy Unit  Anesthesia Type:MAC  Level of Consciousness: drowsy and patient cooperative  Airway & Oxygen Therapy: Patient Spontanous Breathing and Patient connected to face mask oxygen  Post-op Assessment: Report given to RN and Post -op Vital signs reviewed and stable  Post vital signs: Reviewed and stable  Last Vitals:  Vitals Value Taken Time  BP 122/64 09/07/20 0857  Temp    Pulse 75 09/07/20 0858  Resp 13 09/07/20 0858  SpO2 99 % 09/07/20 0858  Vitals shown include unvalidated device data.  Last Pain:  Vitals:   09/07/20 0725  TempSrc: Oral  PainSc: 0-No pain      Patients Stated Pain Goal: 0 (71/58/06 3868)  Complications: No complications documented.

## 2020-09-07 NOTE — Progress Notes (Signed)
  Echocardiogram Echocardiogram Transesophageal has been performed.  Perry Hunter 09/07/2020, 9:17 AM

## 2020-09-07 NOTE — Progress Notes (Signed)
NAME:  Perry Hunter, MRN:  [id number], DOB:  [birth year], LOS: 34 ADMISSION DATE:  08/25/2020, CONSULTATION DATE:  08/26/20 REFERRING MD:  Dr Manuella Ghazi, CHIEF COMPLAINT:  Chf, gib  Brief History   70 yo male with pmh dm2, GERD, tobacco abuse, copd presented to osh with worsening sob/doe and edema as well as black stool x1 month.   History of present illness   70 yo male with pmh HTN, Hyperlipidemia, T2DM, GERD, tobacco abuse, copd, paf on eliquis who presented to osh at the insistence of his family for weeks of increasing abdominal girth/le edema, sob/doe and black tarry stools for 1 month.   Upon presentation pt was found to have hgb of 6, tachycardic, infiltrate on cxr (noted to be covid + as well). He was grossly volume up with hypotension and hemoccult positive.   Pt was started on neo and uptitrated to >18mg, subsequently transitioned to dopamine after conversation with cardiology by Dr SManuella Ghazi Echo was done showing LVEF 20-25% with grade 3 diastolic dysfunction and severe RH dysfunction with RVSP 44.411mg  GI was consulted for his +hemoccult and recommended endoscopy when pt stabilized. At this time he has received 3u prbc.   Pt arrived to our ICU was alert and oriented x2.  On 5 mcg of dopamine.  Satting in 100s on 4 L.  Denied any overt shortness of breath.  Denied any GI symptoms did endorse melena previously consent was obtained from patient and wife for central line and A-line.   Past Medical History   Past Medical History:  Diagnosis Date  . Arthritis   . Asthma   . CHF (congestive heart failure) ([facility]  . Diabetes mellitus without complication ([facility]  . GERD (gastroesophageal reflux disease)   . Hypercholesteremia   . Hypertension    Also with history of A. fib.  [facility]   [facility], losartan started  2/7 back to icu for hypotension 2/9 off pressors. After getting IVFs and holding diuretics 2/10 failed DCV w/  TE[place]  [place]  Procedures:  Central line A Line  Significant Diagnostic Tests:  1/29 echo: LVEF 20-25% grade III diastolic dysfunction, rh severely dysfunctional.  1/30 abdominal Us>> nothing remarkable 08/31/20 RHC Ao = 102/65 (80)  LV =  93/25 RA = 14 RV = 52/15 PA = 53/19 (35) PCW = 28 Fick cardiac output/index = 6.0/2.8 PVR = 1.1 WU Ao sat = 93% PA sat = 51%, 54%  LHC  Prox RCA lesion is 99% stenosed.  Mid LAD to Dist LAD lesion is 40% stenosed.  Prox LAD to Mid LAD lesion is 30% stenosed.  Mid Cx lesion is 100% stenosed.  Dist Cx lesion is 90% stenosed.  Micro Data:  1/28 sars2: positive  Antimicrobials:  N/A  Interim history/subjective:  Off pressors.   Objective   Blood pressure 109/69, pulse 82, temperature 97.6 F (36.4 C), temperature source Axillary, resp. rate (Abnormal) 21, height '5\' 8"'$  (1.727 m), weight 99.9 kg, SpO2 97 %. CVP:  [5 mmHg-8 mmHg] 8 mmHg      Intake/Output Summary (Last 24 hours) at 09/07/2020 1046 Last data filed at 09/07/2020 1000 Gross per 24 hour  Intake 1144.82 ml  Output 700 ml  Net 444.82 ml   Filed Weights   09/06/20 0700 09/07/20 0600 09/07/20 0725  Weight: 99.4 kg 99.9 kg 99.9 kg    Examination:  General: [age]ear old white male sitting  up in bed and currently in no acute distress HEENT normocephalic atraumatic no jugular venous distention appreciated HEENT mucous membranes are moist Pulmonary: Clear to auscultation.Currently on 3 L via nasal cannula Cardiac: Regular irregular atrial fibrillation noted on telemetry Abdomen: Soft not tender no organomegaly tolerating diet Extremities: Warm dry trace lower extremity edema but this is improving Neuro awake oriented and no focal deficits appreciated.  Resolved Hospital Problem list   Cardiogenic shock Hypotension resolved. Felt secondary to overdiuresis Septic shock ruled out ICU related delirium resolved Assessment & Plan:    Hypotension  felt secondary to overdiuresis, cultures have been negative. Procalcitonin's been reassuring He is currently off pressors. Plan Discontinue antibiotics Continuing midodrine Holding diuresis and antihypertensives Check cortisol   Acute HFrEF Cardiorenal syndrome, improved  Plan Continue telemetry monitoring Holding diuresis   Acute hypoxic respiratory failure due to COVID-19 infection, acute cardiogenic pulmonary edema   -S/p 3 days remdesivir ppx COPD  Tobacco use disorder  -off isolation precautions for covid Plan Continue to wean oxygen for saturation greater than 90% Continue daily Incruse and Breo Incentive spirometry and flutter valve Pulse oximetry  Afib, on eliquis PTA. Failed attempt at cardioversion today on 2/10 Plan Continue digoxin Continue amiodarone Continue telemetry Continue apixaban  Subacute GIB- looks to be due to severe esophagitis, CT A/P benign Plan Continuing PPI   Acute anemia, likely due to critical illness Hematologic abnormalities: would not worry about in setting of COVID, acute cardiorenal syndrome.  Can recheck diff as OP. Plan Trending CBC Transfuse for hemoglobin less than 7  DM2, controlled   Plan Continue sliding scale insulin   Best practice:  Diet: NPO for cardioversion heart healthy Pain/Anxiety/Delirium protocol (if indicated): not indicated VAP protocol (if indicated): n/a DVT prophylaxis: eliquis  GI prophylaxis: PPI BID Glucose control: as above Mobility: up to chair Code Status: Full Family Communication: d/w patient 2/7 Disposition: Transfer to progressive 2/10   Erick Colace ACNP-BC [place] Pager # [phone] OR # [phone] if no answer

## 2020-09-07 NOTE — Plan of Care (Signed)
  Problem: Education: Goal: Knowledge of General Education information will improve Description: Including pain rating scale, medication(s)/side effects and non-pharmacologic comfort measures Outcome: Progressing   Problem: Clinical Measurements: Goal: Diagnostic test results will improve Outcome: Progressing   

## 2020-09-07 NOTE — Progress Notes (Signed)
Patient ID: Abishai Sunderhaus, male   DOB: 12/09/1950, 70 y.o.   MRN: [id number]    Advanced Heart Failure Rounding Note   Subjective:    08/29/20 S/P EGD - gastritis. Recommendations for carafate +IV protonix.    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 Elevated filling pressures with preserved CO.  Feels ok. Denies CP or SOB. Remains in AF in 80-90s on IV amio. SBP 110 range. Sats 95% on 3L. No labs yet today.    R/LHC   Prox RCA lesion is 99% stenosed.  Mid LAD to Dist LAD lesion is 40% stenosed.  Prox LAD to Mid LAD lesion is 30% stenosed.  Mid Cx lesion is 100% stenosed.  Dist Cx lesion is 90% stenosed.   Findings:  Ao = 102/65 (80)  LV =  93/25 RA = 14 RV = 52/15 PA = 53/19 (35) PCW = 28 Fick cardiac output/index = 6.0/2.8 PVR = 1.1 WU Ao sat = 93% PA sat = 51%, 54%  Objective:   Weight Range:  Vital Signs:   Temp:  [97.9 F (36.6 C)-98.9 F (37.2 C)] 98.3 F (36.8 C) (02/10 0725) Pulse Rate:  [67-108] 85 (02/10 0725) Resp:  [14-21] 20 (02/10 0725) BP: (84-140)/(49-90) 138/63 (02/10 0725) SpO2:  [91 %-100 %] 100 % (02/10 0725) Weight:  [99.9 kg] 99.9 kg (02/10 0725) Last BM Date: 09/06/20  Weight change: Filed Weights   09/06/20 0700 09/07/20 0600 09/07/20 0725  Weight: 99.4 kg 99.9 kg 99.9 kg    Intake/Output:   Intake/Output Summary (Last 24 hours) at 09/07/2020 0818 Last data filed at 09/07/2020 0600 Gross per 24 hour  Intake 636.49 ml  Output 1025 ml  Net -388.51 ml     PHYSICAL EXAM: CVP 7 General:  Lying in bed . No resp difficulty HEENT: normal Neck: supple. no JVD. Carotids 2+ bilat; no bruits. No lymphadenopathy or thryomegaly appreciated. Cor: PMI nondisplaced. Irregular rate & rhythm. No rubs, gallops or murmurs. Lungs: clear Abdomen: obesesoft, nontender, nondistended. No hepatosplenomegaly. No bruits or masses. Good bowel sounds. Extremities: no  cyanosis, clubbing, rash, edema Neuro: alert & orientedx3, cranial nerves grossly intact. moves all 4 extremities w/o difficulty. Affect pleasant  Telemetry:  Afib rates 80s. Personally reviewed   Labs: Basic Metabolic Panel: Recent Labs  Lab 09/02/20 0247 09/03/20 0327 09/04/20 0437 09/05/20 0549 09/06/20 0409  NA 133* 131* 130* 130* 132*  K 4.3 4.0 4.2 4.0 3.8  CL 81* 88* 92* 93* 96*  CO2 41* 35* '30 28 27  '$ GLUCOSE 109* 106* 103* 123* 89  BUN '20 18 20 14 14  '$ CREATININE 0.93 1.03 1.06 0.91 0.81  CALCIUM 8.4* 8.4* 8.3* 8.5* 8.4*  MG  --  2.3 2.1 1.9 2.0    Liver Function Tests: No results for input(s): AST, ALT, ALKPHOS, BILITOT, PROT, ALBUMIN in the last 168 hours. No results for input(s): LIPASE, AMYLASE in the last 168 hours. No results for input(s): AMMONIA in the last 168 hours.  CBC: Recent Labs  Lab 09/02/20 0247 09/03/20 0327 09/04/20 0437 09/05/20 0549 09/06/20 0409  WBC 11.7* 14.3* 11.4* 11.2* 8.9  HGB 9.3* 9.9* 8.9* 9.1* 9.5*  HCT 34.5* 37.5* 31.7* 32.6* 32.9*  MCV 67.5* 67.8* 66.9* 67.1* 67.0*  PLT 228 254 236 239 238    Cardiac Enzymes: No results for input(s): CKTOTAL, CKMB, CKMBINDEX, TROPONINI in the last 168 hours.  BNP: BNP (last 3 results) Recent Labs  08/25/20 1452  BNP 527.0*    ProBNP (last 3 results) No results for input(s): PROBNP in the last 8760 hours.    Other results:  Imaging: DG CHEST PORT 1 VIEW  Result Date: 09/05/2020 CLINICAL DATA:  Hypotension/shock EXAM: PORTABLE CHEST 1 VIEW COMPARISON:  August 31, 2020 FINDINGS: There is no edema or airspace opacity. There is cardiomegaly with pulmonary vascularity normal. No adenopathy. There is aortic atherosclerosis. Central catheter tip is in the superior vena cava. No pneumothorax. No bone lesions. IMPRESSION: Stable cardiomegaly. No edema or airspace opacity. Central catheter tip in superior vena cava. Aortic Atherosclerosis (ICD10-I70.0). Electronically Signed   By:  Lowella Grip III M.D.   On: 09/05/2020 09:40     Medications:     Scheduled Medications: . [MAR Hold] (feeding supplement) PROSource Plus  30 mL Oral TID BM  . [MAR Hold] albuterol  2 puff Inhalation Q6H  . [MAR Hold] apixaban  5 mg Oral BID  . [MAR Hold] vitamin C  500 mg Oral Daily  . [MAR Hold] bethanechol  10 mg Oral TID  . [MAR Hold] Chlorhexidine Gluconate Cloth  6 each Topical Daily  . [MAR Hold] digoxin  0.125 mg Oral Daily  . [MAR Hold] fluticasone furoate-vilanterol  1 puff Inhalation Daily  . [MAR Hold] folic acid  1 mg Oral Daily  . [MAR Hold] insulin aspart  0-5 Units Subcutaneous QHS  . [MAR Hold] insulin aspart  0-9 Units Subcutaneous TID WC  . [MAR Hold] mouth rinse  15 mL Mouth Rinse BID  . [MAR Hold] midodrine  15 mg Oral TID WC  . [MAR Hold] multivitamin with minerals  1 tablet Oral Daily  . [MAR Hold] pantoprazole (PROTONIX) IV  40 mg Intravenous Q12H  . [MAR Hold] potassium chloride  40 mEq Oral Daily  . [MAR Hold] ramelteon  8 mg Oral QHS  . [MAR Hold] rosuvastatin  20 mg Oral Daily  . [MAR Hold] sodium chloride flush  10-40 mL Intracatheter Q12H  . [MAR Hold] sodium chloride flush  3 mL Intravenous Q12H  . [MAR Hold] sucralfate  1 g Oral QID  . [MAR Hold] thiamine  100 mg Oral Daily  . [MAR Hold] umeclidinium bromide  1 puff Inhalation Daily  . [MAR Hold] zinc sulfate  220 mg Oral Daily    Infusions: . sodium chloride Stopped (09/03/20 1317)  . [MAR Hold] sodium chloride 10 mL/hr at 09/05/20 0800  . sodium chloride    . amiodarone 30 mg/hr (09/07/20 0600)  . [MAR Hold] ceFEPime (MAXIPIME) IV Stopped (09/06/20 2242)    PRN Medications: [MAR Hold] sodium chloride, [MAR Hold] acetaminophen, [MAR Hold] dextromethorphan-guaiFENesin, [MAR Hold] loperamide HCl, [MAR Hold] ondansetron (ZOFRAN) IV, [MAR Hold] sodium chloride flush, [MAR Hold] sodium chloride flush   Assessment/Plan:    1. 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 Elevated filling pressures with preserved CO (on milrinone).  - Plan for medical management of CAD.  - Off milrinone. No labs yet today.  - Hold diuretics this am. Can restart after DC-CV - Hold losartan and Arlyce Harman for now with need for midodrine - c/w digoxin 0.125 daily  - Continue midodrine 15 tid. Wean as toelrated    2.  Acute hypoxic respiratory failure in setting of COVID PNA /AECOPD - Unvacinated CXR shows RML/RLL opacity.  Requiring 3L of oxygen via [place].  - Treatment per CCM: has had Remdesivr, off steroids now. Completed 7 days of doxycycline.   - COVID precautions lifted 2/7 - much improved  3. Persistent AF with RVR - has h/o AF at home. Unclear if this is chronic or not. Given that he is on amio at home suspect not chronic  - Continue amio gtt at 30/hr  - was initially off AC due to GIB. Now on Eliquis. Hgb stable at 9.5.  Follow for recurrent bleeding.  - TE/DC-CV today   4. GIB/Anemia - On eliquis for afib, presentation hgb 6.1, +FOBT; Ferritin 27, iron 13, folate 10.9.  S/p transfused 3 units of PRBCs -> hgb 8.7.  MCV 63. - S/P EGD 2/1 with gastritis. On protonix + carafate.  - Per GI--> not a candidate for colonoscopy at this time. CT A/P without concerning lesions - he is back on a/c, Eliquis 5 mg bid  - Hgb stable at 9.5 - Follow closely for rebleeding.   5. AKI - Likely due to ATN/cardiorenal - Baseline Cr 1.15, peak Cr 2.59 - improved, SCr no 0.8   6. DM2 - per CCM   7. Hyponatremia - improved  8. Persistent Hypotension  - Now off NE but SBPs remains in the 80s. Cuff MAP 65.  - No signs of acute infection/sepsis. WBC 8.9. PCT <0.10. BC pending. - H/H stable  - BP  improved on midodrine 15 tid. -> wean as tolerated - Continue to hold diuretics and BP active meds  - TED hoses   Length of Stay: 13  Glori Bickers, MD  8:18 AM

## 2020-09-07 NOTE — Anesthesia Procedure Notes (Signed)
Procedure Name: MAC Date/Time: 09/07/2020 8:30 AM Performed by: Kathryne Hitch, CRNA Pre-anesthesia Checklist: Patient identified, Suction available, Patient being monitored and Timeout performed Patient Re-evaluated:Patient Re-evaluated prior to induction Oxygen Delivery Method: Nasal cannula Preoxygenation: Pre-oxygenation with 100% oxygen Induction Type: IV induction Placement Confirmation: positive ETCO2 Dental Injury: Teeth and Oropharynx as per pre-operative assessment

## 2020-09-07 NOTE — CV Procedure (Signed)
   TRANSESOPHAGEAL ECHOCARDIOGRAM GUIDED DIRECT CURRENT CARDIOVERSION  NAME:  Gil Vanweelden   MRN: [id number] DOB:  [birth year]   ADMIT DATE: 08/25/2020  INDICATIONS:  Atrial fibrillation  PROCEDURE:   Informed consent was obtained prior to the procedure. The risks, benefits and alternatives for the procedure were discussed and the patient comprehended these risks.  Risks include, but are not limited to, cough, sore throat, vomiting, nausea, somnolence, esophageal and stomach trauma or perforation, bleeding, low blood pressure, aspiration, pneumonia, infection, trauma to the teeth and death.    After a procedural time-out, the oropharynx was anesthetized and the patient was sedated by the anesthesia service. The transesophageal probe was inserted in the esophagus and stomach without difficulty and multiple views were obtained.   FINDINGS:  LEFT VENTRICLE: EF = 30-35%   RIGHT VENTRICLE: Normal size and function  LEFT ATRIUM: Moderate to severely dilated LA diameter 5.5 cm  LEFT ATRIAL APPENDAGE: No clot  RIGHT ATRIUM: Moderately dialted  AORTIC VALVE:  Trileaflet. Mildly calcified Moderate AI  MITRAL VALVE:    Thickened. Mildly degenerative Trivial MR  TRICUSPID VALVE: Normal mild TR  PULMONIC VALVE: Grossly normal Trivial PR  INTERATRIAL SEPTUM: No PFO/ASD  PERICARDIUM: Small effusion  DESCENDING AORTA: Moderate plaque    CARDIOVERSION:     Indications:  Atrial Fibrillation  Procedure Details:  Once the TEE was complete, the patient had the defibrillator pads placed in the anterior and posterior position. Once an appropriate level of sedation was achieved, the patient underwent 3 attempts ad DC-CV withsynchronized 200J shocks and manual pressure on the anterior pad. The patient did not convert to sinus rhythm. No apparent complications.   Glori Bickers, MD  8:55 AM

## 2020-09-07 NOTE — Progress Notes (Signed)
PT Cancellation Note  Patient Details Name: Perry Hunter MRN: [id number] DOB: 12-20-1950   Cancelled Treatment:    Reason Eval/Treat Not Completed: (P) Patient at procedure or test/unavailable Pt is off the floor for an endoscopy. PT will follow back later this afternoon as available.  Erva Koke B. Migdalia Dk PT, DPT Acute Rehabilitation Services Pager [phone] Office [phone]   [place] 09/07/2020, 8:34 AM

## 2020-09-08 DIAGNOSIS — I4819 Other persistent atrial fibrillation: Secondary | ICD-10-CM | POA: Diagnosis not present

## 2020-09-08 DIAGNOSIS — I5043 Acute on chronic combined systolic (congestive) and diastolic (congestive) heart failure: Secondary | ICD-10-CM | POA: Diagnosis not present

## 2020-09-08 LAB — CBC
HCT: 32 % — ABNORMAL LOW (ref 39.0–52.0)
Hemoglobin: 8.9 g/dL — ABNORMAL LOW (ref 13.0–17.0)
MCH: 18.5 pg — ABNORMAL LOW (ref 26.0–34.0)
MCHC: 27.8 g/dL — ABNORMAL LOW (ref 30.0–36.0)
MCV: 66.4 fL — ABNORMAL LOW (ref 80.0–100.0)
Platelets: 248 10*3/uL (ref 150–400)
RBC: 4.82 MIL/uL (ref 4.22–5.81)
RDW: 28.6 % — ABNORMAL HIGH (ref 11.5–15.5)
WBC: 8 10*3/uL (ref 4.0–10.5)
nRBC: 0.4 % — ABNORMAL HIGH (ref 0.0–0.2)

## 2020-09-08 LAB — BASIC METABOLIC PANEL
Anion gap: 7 (ref 5–15)
BUN: 16 mg/dL (ref 8–23)
CO2: 29 mmol/L (ref 22–32)
Calcium: 8.2 mg/dL — ABNORMAL LOW (ref 8.9–10.3)
Chloride: 97 mmol/L — ABNORMAL LOW (ref 98–111)
Creatinine, Ser: 0.79 mg/dL (ref 0.61–1.24)
GFR, Estimated: >60 mL/min (ref >60)
Glucose, Bld: 106 mg/dL — ABNORMAL HIGH (ref 70–99)
Potassium: 4 mmol/L (ref 3.5–5.1)
Sodium: 133 mmol/L — ABNORMAL LOW (ref 135–145)

## 2020-09-08 LAB — FERRITIN: Ferritin: 127 ng/mL (ref 24–336)

## 2020-09-08 LAB — IRON AND TIBC
Iron: 45 ug/dL (ref 45–182)
Saturation Ratios: 17 % — ABNORMAL LOW (ref 17.9–39.5)
TIBC: 258 ug/dL (ref 250–450)
UIBC: 213 ug/dL

## 2020-09-08 LAB — COOXEMETRY PANEL
Carboxyhemoglobin: 1.6 % — ABNORMAL HIGH (ref 0.5–1.5)
Methemoglobin: 0.8 % (ref 0.0–1.5)
O2 Saturation: 79.1 %
Total hemoglobin: 9.1 g/dL — ABNORMAL LOW (ref 12.0–16.0)

## 2020-09-08 LAB — GLUCOSE, CAPILLARY
Glucose-Capillary: 108 mg/dL — ABNORMAL HIGH (ref 70–99)
Glucose-Capillary: 128 mg/dL — ABNORMAL HIGH (ref 70–99)
Glucose-Capillary: 138 mg/dL — ABNORMAL HIGH (ref 70–99)
Glucose-Capillary: 116 mg/dL — ABNORMAL HIGH (ref 70–99)

## 2020-09-08 MED ORDER — FUROSEMIDE 40 MG PO TABS
40.0000 mg | ORAL_TABLET | Freq: Every day | ORAL | Status: DC
  Administered 2020-09-08 – 2020-09-09 (×2): 40 mg via ORAL
  Filled 2020-09-08 (×2): qty 1

## 2020-09-08 MED ORDER — PANTOPRAZOLE SODIUM 40 MG PO TBEC
40.0000 mg | DELAYED_RELEASE_TABLET | Freq: Two times a day (BID) | ORAL | Status: DC
  Administered 2020-09-08 – 2020-09-09 (×2): 40 mg via ORAL
  Filled 2020-09-08 (×2): qty 1

## 2020-09-08 MED ORDER — MAGNESIUM SULFATE 2 GM/50ML IV SOLN
2.0000 g | Freq: Once | INTRAVENOUS | Status: AC
  Administered 2020-09-08: 2 g via INTRAVENOUS
  Filled 2020-09-08: qty 50

## 2020-09-08 MED ORDER — TAMSULOSIN HCL 0.4 MG PO CAPS
0.8000 mg | ORAL_CAPSULE | Freq: Every evening | ORAL | Status: DC
  Administered 2020-09-08: 0.8 mg via ORAL
  Filled 2020-09-08: qty 2

## 2020-09-08 NOTE — Consult Note (Signed)
   [facility] Inpatient Consult   09/08/2020  Chou Beldin 10-09-1950 [id number]  [place] [facility] [ACO] Patient: [employer]  Patient screened for hospitalization with noted high risk score for unplanned readmission risk and length of stay hospitalization to assess for transition of care needs for post hospital needs.  Review of patient's medical record reveals patient continues in progressive care needs at this time.  Review of inpatient [facility] team notes for medication and PTand OT evaluation of recommendation for home health for post hospital care needs.  Plan:  Continue to follow progress and disposition to assess for post hospital care management needs.    For questions contact:   Natividad Brood, RN BSN [facility] Liaison  [phone] business mobile phone Toll free office [phone]  Fax number: [phone] [email or web address]

## 2020-09-08 NOTE — Progress Notes (Signed)
Patient c/o pain/discomfort in lower abdomen. Foley was d/c on 2H prior to transfer to unit. Patient has external catheter. Patient has put out approximately 50 ml since arriving to unit.   Patient states that he has not had any of his home med to help urinate. Bladder scan done, 309 ml. Will pass on to day shift for further intervention. Will continue to monitor

## 2020-09-08 NOTE — Progress Notes (Signed)
Occupational Therapy Treatment Patient Details Name: Perry Hunter MRN: [id number] DOB: 07-01-1951 Today's Date: 09/08/2020    History of present illness 70 yo male with pmh dm2, GERD, tobacco abuse, copd presented to osh with worsening sob/doe and edema as well as black stool x1 month. Upon presentation pt was found to have hgb of 6, tachycardic, infiltrate on cxr (noted to be covid + as well). He was grossly volume up with hypotension and hemoccult positive. Echo was done showing LVEF 20-25% with grade 3 diastolic dysfunction and severe RH dysfunction.  Transferred to ICU for hypotension and started on norepinephrine IV on 09/04/20.   OT comments  Patient transitioned out of ICU and back to a step down unit.  Patient is making slow but steady progress with lower body ADL and functional mobility.  Patient was able to walk around the foot of the bed to use the [facility] on the other side of the bed.  Able to walk 3' to the sink, and stand to wash his hands after a brief sitting break.  OT will continue to follow in the acute setting to progress functional mobility, and ADL status for a return home with [facility] and initial 24 hour assist.      Follow Up Recommendations  Home health OT    Equipment Recommendations  3 in 1 bedside commode    Recommendations for Other Services      Precautions / Restrictions Precautions Precautions: Fall Restrictions Weight Bearing Restrictions: No       Mobility Bed Mobility   Bed Mobility: Rolling;Sit to Supine Rolling: Modified independent (Device/Increase time)     Sit to supine: Min assist;Mod assist      Transfers   Equipment used: Rolling walker (2 wheeled) Transfers: Sit to/from Stand Sit to Stand: Min guard Stand pivot transfers: Min guard            Balance           Standing balance support: Bilateral upper extremity supported Standing balance-Leahy Scale: Poor                             ADL either performed or  assessed with clinical judgement   ADL   Eating/Feeding: Set up;Sitting   Grooming: Wash/dry hands;Wash/dry face;Sitting;Set up               Lower Body Dressing: Sit to/from stand;Moderate assistance               Functional mobility during ADLs: Minimal assistance;Rolling walker       Vision       Perception     Praxis      Cognition Arousal/Alertness: Awake/alert Behavior During Therapy: WFL for tasks assessed/performed Overall Cognitive Status: Impaired/Different from baseline Area of Impairment: Safety/judgement                         Safety/Judgement: Decreased awareness of deficits   Problem Solving: Requires verbal cues          Exercises     Shoulder Instructions       General Comments      Pertinent Vitals/ Pain       Faces Pain Scale: Hurts a little bit Pain Location: patient's bottom Pain Descriptors / Indicators: Discomfort Pain Intervention(s): Repositioned  Frequency  Min 2X/week        Progress Toward Goals  OT Goals(current goals can now be found in the care plan section)  Progress towards OT goals: Progressing toward goals  Acute Rehab OT Goals Patient Stated Goal: I need to keep moving OT Goal Formulation: With patient Time For Goal Achievement: 09/15/20 Potential to Achieve Goals: [place] Discharge plan remains appropriate    Co-evaluation                 AM-PAC OT "6 Clicks" Daily Activity     Outcome Measure   Help from another person eating meals?: None Help from another person taking care of personal grooming?: None Help from another person toileting, which includes using toliet, bedpan, or urinal?: A Little Help from another person bathing (including washing, rinsing, drying)?: A Lot Help from another person to put on and taking off regular upper body clothing?: A Little Help from another person to  put on and taking off regular lower body clothing?: A Lot 6 Click Score: 18    End of Session Equipment Utilized During Treatment: Rolling walker;Oxygen  OT Visit Diagnosis: Unsteadiness on feet (R26.81);Muscle weakness (generalized) (M62.81)   Activity Tolerance Patient tolerated treatment well   Patient Left in bed;with call bell/phone within reach   Nurse Communication          Time: [id number] OT Time Calculation (min): 21 min  Charges: OT General Charges $OT Visit: 1 Visit OT Treatments $Self Care/Home Management : 8-22 mins  09/08/2020  Rich, OTR/L  Acute Rehabilitation Services  Office:  [place] 09/08/2020, 2:02 PM

## 2020-09-08 NOTE — Care Management Important Message (Signed)
Important Message  Patient Details  Name: Perry Hunter MRN: [id number] Date of Birth: 1950/09/09   Medicare Important Message Given:  Yes     Shelda Altes 09/08/2020, 12:25 PM

## 2020-09-08 NOTE — Plan of Care (Signed)
  Problem: Clinical Measurements: Goal: Respiratory complications will improve Outcome: Progressing   Problem: Activity: Goal: Risk for activity intolerance will decrease Outcome: Progressing   

## 2020-09-08 NOTE — Progress Notes (Signed)
PROGRESS NOTE    Perry Hunter  [id number] DOB: 1951/05/12 DOA: 08/25/2020 PCP: Neale Burly, MD   Chief Complain: Leg swelling  Brief Narrative: Patient is a 68 male with history of hypertension, hyperlipidemia, diabetes type 2, GERD, COPD, obesity who presented initially to the emergency department with complaints of 2 to 13-monthhistory of increased bilateral leg swelling.  On presentation he was hypotensive, anemic with hemoglobin of 6.1, hyponatremic, had AKI, elevated BNP.  Fecal occult blood was positive.  Covid screen test was positive.  GI consulted.  He was started on steroid for Covid infection.  Transferred to PCCM service because he required pressures.  Echocardiogram done during this hospitalization showed ejection fraction of 2025%, grade thoracic dysfunction, severe right ventricular dysfunction.  Cardiology was following.  He was being managed for acute systolic congestive heart failure, cardiology saw, atrial fibrillation.  Status post TEE with failed cardioversion.  [facility]  Assessment & Plan:   Principal Problem:   GI bleed Active Problems:   Symptomatic anemia   Microcytic anemia   Elevated brain natriuretic peptide (BNP) level   Hypotension   Hyponatremia   Chronic kidney disease   Essential hypertension   Hyperlipidemia   GERD (gastroesophageal reflux disease)   COPD (chronic obstructive pulmonary disease) (HCC)   Diabetes mellitus ([place]   Obesity   Cardiogenic shock ([place]   Acute on chronic combined systolic and diastolic CHF (congestive heart failure) (HCC)   PAF (paroxysmal atrial fibrillation) (HCC)   Persistent atrial fibrillation (HCC)   Acute hypoxic respiratory failure :Multifactorial from COPD,Covid pneumonia, atelectasis.chest x-ray had shown right middle lobe/right lower lobe opacity.  Currently on 3 L of oxygen per minute.  Continue to wean the oxygen if possible.  Continue inhaled steroids, flutter valve.  On Breo  and Incruse, mobilize.  Completed 7 days course of doxycycline.  Cardiogenic shock: Continue midodrine.  Currently blood pressure soft but ok. Antibiotics have been stopped due to negative cultures.  Acute systolic congestive heart failure: Biventricular failure.  Possibly from chronic tachycardia, possible alcohol induced, A. fib.  Heart failure team following.  On amiodarone, digoxin,milrinone Right/left heart catheterization showed nonobstructive coronary artery  Disease.  Persistent A. fib with RVR: On amiodarone drip.  Hyponatremia: Currently stable.  Continue to monitor   Covid infection: Treated with steroids, remdesivir currently respiratory status is stable.  On 3 L of oxygen per minute.  Symptomatic anemia/microcytic anemia: Hemoglobin of 6.1 on presentation.   Currently hemoglobin stable.  On Eliquis now. Iron studies had showed low iron.  He needs iron supplementation on discharge.  He was transfused with 3 units of PRBC during this hospitalization. Underwent EGD on 08/29/2020 with finding of gastritis.  GI recommended Carafate and IV Protonix.  We will check iron studies again, will supplement with IV iron if necessary.  COPD: Not on home oxygen.  Continue supplemental oxygen as needed.  Continue bronchodilators, he is tobacco  CKD stage IV: Currently kidney function at baseline  Hyperlipidemia: On Zocor  GERD: Protonix  Diabetes type 2: Continue current insulin regimen, Tradjenta.  Monitor blood sugars  Obesity: BMI of 35.4  Urinary retention: Continue Flomax.  Continue in and out, if needed we have to reinsert the Foley.  He needs to follow-up with urology as an outpatient.  Debility/deconditioning: Mobilization encouraged.  PT/OT recommended home with on discharge.    Nutrition Problem: Inadequate oral intake Etiology: decreased appetite      DVT prophylaxis:Eliquis Code Status: Full Family  Communication: None at bedside Status is: Inpatient  Remains  inpatient appropriate because:IV treatments appropriate due to intensity of illness or inability to take PO and Inpatient level of care appropriate due to severity of illness   Dispo: The patient is from: Home              Anticipated d/c is to: Home              Anticipated d/c date is: > 3 days              Patient currently is not medically stable to d/c.   Difficult to place patient No     Consultants: PCCM, cardiology, GI  Procedures: None  Antimicrobials:  Anti-infectives (From admission, onward)   Start     Dose/Rate Route Frequency Ordered Stop   09/05/20 1100  vancomycin (VANCOREADY) IVPB 1750 mg/350 mL  Status:  Discontinued        1,750 mg 175 mL/hr over 120 Minutes Intravenous Every 24 hours 09/05/20 0838 09/06/20 0808   09/05/20 0930  ceFEPIme (MAXIPIME) 2 g in sodium chloride 0.9 % 100 mL IVPB  Status:  Discontinued        2 g 200 mL/hr over 30 Minutes Intravenous Every 8 hours 09/05/20 0838 09/07/20 1102   08/29/20 2300  doxycycline (VIBRAMYCIN) 100 mg in sodium chloride 0.9 % 250 mL IVPB        100 mg 125 mL/hr over 120 Minutes Intravenous Every 12 hours 08/29/20 2202 09/02/20 0020   08/28/20 1000  remdesivir 100 mg in sodium chloride 0.9 % 100 mL IVPB  Status:  Discontinued       "Followed by" Linked Group Details   100 mg 200 mL/hr over 30 Minutes Intravenous Daily 08/26/20 2353 08/28/20 0821   08/28/20 1000  doxycycline (VIBRA-TABS) tablet 100 mg  Status:  Discontinued        100 mg Oral Every 12 hours 08/28/20 0817 08/29/20 2202   08/28/20 1000  remdesivir 100 mg in sodium chloride 0.9 % 100 mL IVPB        100 mg 200 mL/hr over 30 Minutes Intravenous Daily 08/28/20 0823 08/29/20 1048   08/27/20 0045  remdesivir 200 mg in sodium chloride 0.9% 250 mL IVPB       "Followed by" Linked Group Details   200 mg 580 mL/hr over 30 Minutes Intravenous Once 08/26/20 2353 08/27/20 0206      Subjective: Patient seen and examined the bedside this morning.  Has trouble  with urinary retention.  Respiratory status stable, on 3 L/minute.  Blood pressure soft but stable.  Denies any new complaints.  Looks overall comfortable  Objective: Vitals:   09/08/20 0044 09/08/20 0437 09/08/20 0634 09/08/20 0752  BP:  (!) 139/99  103/65  Pulse:  81 100 79  Resp:  '19 16 14  '$ Temp: 98.3 F (36.8 C) 97.8 F (36.6 C)  98.1 F (36.7 C)  TempSrc:  Oral  Oral  SpO2:  95% 97% 98%  Weight:   105.8 kg   Height:        Intake/Output Summary (Last 24 hours) at 09/08/2020 0759 Last data filed at 09/08/2020 0209 Gross per 24 hour  Intake 1069 ml  Output 275 ml  Net 794 ml   Filed Weights   09/07/20 0600 09/07/20 0725 09/08/20 0634  Weight: 99.9 kg 99.9 kg 105.8 kg    Examination:  General exam: Deconditioned, debilitated, obese  Respiratory system: Bilateral diminished breath sounds, no wheezes  or crackles  Cardiovascular system: A. fib no JVD, murmurs, rubs, gallops or clicks. No pedal edema. Gastrointestinal system: Abdomen is nondistended, soft and nontender. No organomegaly or masses felt. Normal bowel sounds heard. Central nervous system: Alert and oriented. No focal neurological deficits. Extremities: No edema, no clubbing ,no cyanosis Skin: Scattered ecchymosis, no ulcers    Data Reviewed: I have personally reviewed following labs and imaging studies  CBC: Recent Labs  Lab 09/04/20 0437 09/05/20 0549 09/06/20 0409 09/07/20 1213 09/08/20 0409  WBC 11.4* 11.2* 8.9 9.7 8.0  HGB 8.9* 9.1* 9.5* 9.3* 8.9*  HCT 31.7* 32.6* 32.9* 34.4* 32.0*  MCV 66.9* 67.1* 67.0* 67.2* 66.4*  PLT 236 239 238 244 [id number]   Basic Metabolic Panel: Recent Labs  Lab 09/03/20 0327 09/04/20 0437 09/05/20 0549 09/06/20 0409 09/07/20 1213 09/08/20 0409  NA 131* 130* 130* 132* 131* 133*  K 4.0 4.2 4.0 3.8 4.2 4.0  CL 88* 92* 93* 96* 97* 97*  CO2 35* '30 28 27 26 29  '$ GLUCOSE 106* 103* 123* 89 218* 106*  BUN '18 20 14 14 17 16  '$ CREATININE 1.03 1.06 0.91 0.81 0.91 0.79   CALCIUM 8.4* 8.3* 8.5* 8.4* 8.4* 8.2*  MG 2.3 2.1 1.9 2.0 1.6*  --    GFR: Estimated Creatinine Clearance: 99.9 mL/min (by C-G formula based on SCr of 0.79 mg/dL). Liver Function Tests: No results for input(s): AST, ALT, ALKPHOS, BILITOT, PROT, ALBUMIN in the last 168 hours. No results for input(s): LIPASE, AMYLASE in the last 168 hours. No results for input(s): AMMONIA in the last 168 hours. Coagulation Profile: No results for input(s): INR, PROTIME in the last 168 hours. Cardiac Enzymes: No results for input(s): CKTOTAL, CKMB, CKMBINDEX, TROPONINI in the last 168 hours. BNP (last 3 results) No results for input(s): PROBNP in the last 8760 hours. HbA1C: No results for input(s): HGBA1C in the last 72 hours. CBG: Recent Labs  Lab 09/07/20 0710 09/07/20 1118 09/07/20 1520 09/07/20 2116 09/08/20 0628  GLUCAP 110* 211* 173* 92 108*   Lipid Profile: No results for input(s): CHOL, HDL, LDLCALC, TRIG, CHOLHDL, LDLDIRECT in the last 72 hours. Thyroid Function Tests: No results for input(s): TSH, T4TOTAL, FREET4, T3FREE, THYROIDAB in the last 72 hours. Anemia Panel: No results for input(s): VITAMINB12, FOLATE, FERRITIN, TIBC, IRON, RETICCTPCT in the last 72 hours. Sepsis Labs: Recent Labs  Lab 09/05/20 1026 09/06/20 0409  PROCALCITON <0.10 <0.10    Recent Results (from the past 240 hour(s))  Urine Culture     Status: None   Collection Time: 08/30/20  1:21 PM   Specimen: Urine, Random  Result Value Ref Range Status   Specimen Description URINE, RANDOM  Final   Special Requests NONE  Final   Culture   Final    NO GROWTH Performed at [facility], [address], [place], [place] [address]    Report Status 08/31/2020 FINAL  Final  Culture, blood (routine x 2)     Status: None (Preliminary result)   Collection Time: 09/05/20  8:55 AM   Specimen: BLOOD  Result Value Ref Range Status   Specimen Description BLOOD RIGHT ANTECUBITAL  Final   Special Requests   Final     BOTTLES DRAWN AEROBIC AND ANAEROBIC Blood Culture adequate volume   Culture   Final    NO GROWTH 2 DAYS Performed at [facility], [place] [address] [place], [place] [address]    Report Status PENDING  Incomplete  Culture, blood (routine  x 2)     Status: None (Preliminary result)   Collection Time: 09/05/20  9:00 AM   Specimen: BLOOD  Result Value Ref Range Status   Specimen Description BLOOD LEFT ANTECUBITAL  Final   Special Requests   Final    BOTTLES DRAWN AEROBIC AND ANAEROBIC Blood Culture adequate volume   Culture   Final    NO GROWTH 2 DAYS Performed at [facility], [address], [place], [place] [address]    Report Status PENDING  Incomplete  Culture, Urine     Status: None   Collection Time: 09/05/20 12:30 PM   Specimen: Urine, Random  Result Value Ref Range Status   Specimen Description URINE, RANDOM  Final   Special Requests NONE  Final   Culture   Final    NO GROWTH Performed at [facility], [place] [address] [place], [place] [address]    Report Status 09/06/2020 FINAL  Final         Radiology Studies: No results found.      Scheduled Meds: . (feeding supplement) PROSource Plus  30 mL Oral TID BM  . albuterol  2 puff Inhalation Q6H  . apixaban  5 mg Oral BID  . vitamin C  500 mg Oral Daily  . bethanechol  10 mg Oral TID  . Chlorhexidine Gluconate Cloth  6 each Topical Daily  . digoxin  0.0625 mg Oral Daily  . fluticasone furoate-vilanterol  1 puff Inhalation Daily  . folic acid  1 mg Oral Daily  . insulin aspart  0-5 Units Subcutaneous QHS  . insulin aspart  0-9 Units Subcutaneous TID WC  . mouth rinse  15 mL Mouth Rinse BID  . midodrine  15 mg Oral TID WC  . multivitamin with minerals  1 tablet Oral Daily  . pantoprazole (PROTONIX) IV  40 mg Intravenous Q12H  . potassium chloride  40 mEq Oral Daily  . ramelteon  8 mg Oral QHS  . rosuvastatin  20 mg Oral Daily  . sodium chloride flush  10-40 mL Intracatheter Q12H  .  sucralfate  1 g Oral QID  . thiamine  100 mg Oral Daily  . umeclidinium bromide  1 puff Inhalation Daily  . zinc sulfate  220 mg Oral Daily   Continuous Infusions: . sodium chloride Stopped (09/03/20 1317)  . amiodarone 30 mg/hr (09/07/20 2233)     LOS: 14 days    Time spent: 35 mins.More than 50% of that time was spent in counseling and/or coordination of care.      Shelly Coss, MD [facility] P2/05/2021, 7:59 AM

## 2020-09-08 NOTE — Progress Notes (Addendum)
Patient ID: Perry Hunter, male   DOB: 1951/03/11, 70 y.o.   MRN: [id number]    Advanced Heart Failure Rounding Note   Subjective:    08/29/20 S/P EGD - gastritis. Recommendations for carafate +IV protonix.    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 Elevated filling pressures with preserved CO.  2/10 Failure cardioversion.   Remains on amio drip 30 mg per hour.   Denies SOB. Feeling a little better today.    Objective:   Weight Range:  Vital Signs:   Temp:  [97.8 F (36.6 C)-99 F (37.2 C)] 98.1 F (36.7 C) (02/11 0752) Pulse Rate:  [52-129] 79 (02/11 0752) Resp:  [14-21] 14 (02/11 0752) BP: (92-139)/(52-99) 103/65 (02/11 0752) SpO2:  [90 %-100 %] 98 % (02/11 0807) Weight:  [105.8 kg] 105.8 kg (02/11 0634) Last BM Date: 09/06/20  Weight change: Filed Weights   09/07/20 0600 09/07/20 0725 09/08/20 0634  Weight: 99.9 kg 99.9 kg 105.8 kg    Intake/Output:   Intake/Output Summary (Last 24 hours) at 09/08/2020 1015 Last data filed at 09/08/2020 0209 Gross per 24 hour  Intake 507.36 ml  Output 200 ml  Net 307.36 ml     PHYSICAL EXAM: General:   No resp difficulty HEENT: normal Neck: supple. no JVD. Carotids 2+ bilat; no bruits. No lymphadenopathy or thryomegaly appreciated. Cor: PMI nondisplaced. Irregular rate & rhythm. No rubs, gallops or murmurs. Lungs: clear Abdomen: soft, nontender, nondistended. No hepatosplenomegaly. No bruits or masses. Good bowel sounds. Extremities: no cyanosis, clubbing, rash, edema Neuro: alert & orientedx3, cranial nerves grossly intact. moves all 4 extremities w/o difficulty. Affect pleasant  Telemetry:   A fib 80-90s   Labs: Basic Metabolic Panel: Recent Labs  Lab 09/03/20 0327 09/04/20 0437 09/05/20 0549 09/06/20 0409 09/07/20 1213 09/08/20 0409  NA 131* 130* 130* 132* 131* 133*  K 4.0 4.2 4.0 3.8 4.2 4.0  CL 88* 92* 93* 96* 97* 97*  CO2 35* '30  28 27 26 29  '$ GLUCOSE 106* 103* 123* 89 218* 106*  BUN '18 20 14 14 17 16  '$ CREATININE 1.03 1.06 0.91 0.81 0.91 0.79  CALCIUM 8.4* 8.3* 8.5* 8.4* 8.4* 8.2*  MG 2.3 2.1 1.9 2.0 1.6*  --     Liver Function Tests: No results for input(s): AST, ALT, ALKPHOS, BILITOT, PROT, ALBUMIN in the last 168 hours. No results for input(s): LIPASE, AMYLASE in the last 168 hours. No results for input(s): AMMONIA in the last 168 hours.  CBC: Recent Labs  Lab 09/04/20 0437 09/05/20 0549 09/06/20 0409 09/07/20 1213 09/08/20 0409  WBC 11.4* 11.2* 8.9 9.7 8.0  HGB 8.9* 9.1* 9.5* 9.3* 8.9*  HCT 31.7* 32.6* 32.9* 34.4* 32.0*  MCV 66.9* 67.1* 67.0* 67.2* 66.4*  PLT 236 239 238 244 248    Cardiac Enzymes: No results for input(s): CKTOTAL, CKMB, CKMBINDEX, TROPONINI in the last 168 hours.  BNP: BNP (last 3 results) Recent Labs    08/25/20 1452  BNP 527.0*    ProBNP (last 3 results) No results for input(s): PROBNP in the last 8760 hours.    Other results:  Imaging: No results found.   Medications:     Scheduled Medications: . (feeding supplement) PROSource Plus  30 mL Oral TID BM  . albuterol  2 puff Inhalation Q6H  . apixaban  5 mg Oral BID  . vitamin C  500 mg Oral Daily  . bethanechol  10 mg Oral  TID  . Chlorhexidine Gluconate Cloth  6 each Topical Daily  . digoxin  0.0625 mg Oral Daily  . fluticasone furoate-vilanterol  1 puff Inhalation Daily  . folic acid  1 mg Oral Daily  . insulin aspart  0-5 Units Subcutaneous QHS  . insulin aspart  0-9 Units Subcutaneous TID WC  . mouth rinse  15 mL Mouth Rinse BID  . midodrine  15 mg Oral TID WC  . multivitamin with minerals  1 tablet Oral Daily  . pantoprazole (PROTONIX) IV  40 mg Intravenous Q12H  . potassium chloride  40 mEq Oral Daily  . ramelteon  8 mg Oral QHS  . rosuvastatin  20 mg Oral Daily  . sodium chloride flush  10-40 mL Intracatheter Q12H  . sucralfate  1 g Oral QID  . tamsulosin  0.8 mg Oral QPM  . thiamine  100 mg  Oral Daily  . umeclidinium bromide  1 puff Inhalation Daily  . zinc sulfate  220 mg Oral Daily    Infusions: . sodium chloride Stopped (09/03/20 1317)  . amiodarone 30 mg/hr (09/08/20 0947)    PRN Medications: acetaminophen, dextromethorphan-guaiFENesin, loperamide HCl, ondansetron (ZOFRAN) IV, sodium chloride flush   Assessment/Plan:    1. 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 Elevated filling pressures with preserved CO (on milrinone).  - Plan for medical management of CAD.  - Off milrinone. CO-OX 79%.   -CVP 10-11 today. Start lasix 40 mg po daily.   - No ARB/Spiro due to hypotension.  - Continue midodrine.  - c/w digoxin 0.125 daily - Renal function   2. Acute hypoxic respiratory failure in setting of COVID PNA /AECOPD - Unvacinated CXR shows RML/RLL opacity.  Requiring 3L of oxygen via [place].  - Treatment per CCM: has had Remdesivr, off steroids now. Completed 7 days of doxycycline.   - COVID precautions lifted 2/7 - much improved  3. Persistent AF with RVR - has h/o AF at home. Unclear if this is chronic or not. Given that he is on amio at home suspect not chronic  -Failed cardioversion 2/10.  - Stop amio gtt at 30/hr  - was initially off AC due to GIB. Now on Eliquis.    4. GIB/Anemia - On eliquis for afib, presentation hgb 6.1, +FOBT; Ferritin 27, iron 13, folate 10.9.  S/p transfused 3 units of PRBCs -> hgb 8.7.  MCV 63. - S/P EGD 2/1 with gastritis. On protonix + carafate.  - Per GI--> not a candidate for colonoscopy at this time. CT A/P without concerning lesions - he is back on a/c, Eliquis 5 mg bid  - Hgb 8 - Follow closely for rebleeding.   5. AKI - Likely  due to ATN/cardiorenal - Baseline Cr 1.15, peak Cr 2.59 - Resolved.   6. DM2 - per CCM   7. Hyponatremia -133  8. Persistent Hypotension  - Now off NE but SBPs remains in the 80s. Cuff MAP 65.  - No signs of acute infection/sepsis. WBC 8.9. PCT <0.10. BC pending. - Continue midodrine 15 tid. -> wean as tolerated - Continue to hold diuretics and BP active meds  - TED hose.    Length of Stay: [place], NP  10:15 AM    Patient seen and examined with the above-signed Advanced Practice Provider and/or Housestaff. I personally reviewed laboratory data, imaging studies and relevant notes. I independently examined the patient and formulated the important aspects of the plan. I have edited the note to reflect any of my changes or salient points. I have personally discussed the plan with the patient and/or family.  Failed DC-CV yesterday despite aggressive amio load. Remains on amio gtt. Rate controlled. Breathing better. Co-ox 79%. CVP 10  SBP ~ 100 on midodrine  General:  Sitting up in bed No resp difficulty HEENT: normal Neck: supple. JVP 10. Carotids 2+ bilat; no bruits. No lymphadenopathy or thryomegaly appreciated. Cor: PMI nondisplaced. Irregular rate & rhythm. No rubs, gallops or murmurs. Lungs: clear Abdomen: obese soft, nontender, nondistended. No hepatosplenomegaly. No bruits or masses. Good bowel sounds. Extremities: no cyanosis, clubbing, rash, edema Neuro: alert & orientedx3, cranial nerves grossly intact. moves all 4 extremities w/o difficulty. Affect pleasant  He failed DC-CV. Stop amio. Will pursue rate control strategy. Continue Eliquis. Continue midodrine for BP support. Agree with restarting diuretics. May need torsemide.   Glori Bickers, MD  2:22 PM

## 2020-09-09 DIAGNOSIS — I5043 Acute on chronic combined systolic (congestive) and diastolic (congestive) heart failure: Secondary | ICD-10-CM | POA: Diagnosis not present

## 2020-09-09 DIAGNOSIS — E871 Hypo-osmolality and hyponatremia: Secondary | ICD-10-CM | POA: Diagnosis not present

## 2020-09-09 DIAGNOSIS — D649 Anemia, unspecified: Secondary | ICD-10-CM | POA: Diagnosis not present

## 2020-09-09 DIAGNOSIS — J9601 Acute respiratory failure with hypoxia: Secondary | ICD-10-CM | POA: Diagnosis not present

## 2020-09-09 LAB — CBC
HCT: 30.8 % — ABNORMAL LOW (ref 39.0–52.0)
Hemoglobin: 8.4 g/dL — ABNORMAL LOW (ref 13.0–17.0)
MCH: 18.1 pg — ABNORMAL LOW (ref 26.0–34.0)
MCHC: 27.3 g/dL — ABNORMAL LOW (ref 30.0–36.0)
MCV: 66.2 fL — ABNORMAL LOW (ref 80.0–100.0)
Platelets: 264 10*3/uL (ref 150–400)
RBC: 4.65 MIL/uL (ref 4.22–5.81)
RDW: 28.4 % — ABNORMAL HIGH (ref 11.5–15.5)
WBC: 6.3 10*3/uL (ref 4.0–10.5)
nRBC: 0.6 % — ABNORMAL HIGH (ref 0.0–0.2)

## 2020-09-09 LAB — COOXEMETRY PANEL
Carboxyhemoglobin: 1.8 % — ABNORMAL HIGH (ref 0.5–1.5)
Methemoglobin: 0.9 % (ref 0.0–1.5)
O2 Saturation: 59.1 %
Total hemoglobin: 8.6 g/dL — ABNORMAL LOW (ref 12.0–16.0)

## 2020-09-09 LAB — BASIC METABOLIC PANEL
Anion gap: 9 (ref 5–15)
BUN: 17 mg/dL (ref 8–23)
CO2: 28 mmol/L (ref 22–32)
Calcium: 7.9 mg/dL — ABNORMAL LOW (ref 8.9–10.3)
Chloride: 98 mmol/L (ref 98–111)
Creatinine, Ser: 0.78 mg/dL (ref 0.61–1.24)
GFR, Estimated: >60 mL/min (ref >60)
Glucose, Bld: 97 mg/dL (ref 70–99)
Potassium: 3.6 mmol/L (ref 3.5–5.1)
Sodium: 135 mmol/L (ref 135–145)

## 2020-09-09 LAB — MAGNESIUM: Magnesium: 1.9 mg/dL (ref 1.7–2.4)

## 2020-09-09 LAB — GLUCOSE, CAPILLARY
Glucose-Capillary: 95 mg/dL (ref 70–99)
Glucose-Capillary: 137 mg/dL — ABNORMAL HIGH (ref 70–99)
Glucose-Capillary: 128 mg/dL — ABNORMAL HIGH (ref 70–99)

## 2020-09-09 MED ORDER — UMECLIDINIUM BROMIDE 62.5 MCG/INH IN AEPB: 1.0000 | INHALATION_SPRAY | Freq: Every day | RESPIRATORY_TRACT | 1 refills | Status: AC

## 2020-09-09 MED ORDER — FOLIC ACID 1 MG PO TABS: 1.0000 mg | ORAL_TABLET | Freq: Every day | ORAL | 1 refills | Status: DC

## 2020-09-09 MED ORDER — ALBUTEROL SULFATE HFA 108 (90 BASE) MCG/ACT IN AERS: 2.0000 | INHALATION_SPRAY | RESPIRATORY_TRACT | Status: DC | PRN

## 2020-09-09 MED ORDER — ROSUVASTATIN CALCIUM 20 MG PO TABS: 20.0000 mg | ORAL_TABLET | Freq: Every day | ORAL | 1 refills | Status: DC

## 2020-09-09 MED ORDER — POTASSIUM CHLORIDE CRYS ER 20 MEQ PO TBCR: 40.0000 meq | EXTENDED_RELEASE_TABLET | Freq: Every day | ORAL | 1 refills | Status: DC

## 2020-09-09 MED ORDER — MIDODRINE HCL 5 MG PO TABS: 15.0000 mg | ORAL_TABLET | Freq: Three times a day (TID) | ORAL | 1 refills | Status: DC

## 2020-09-09 MED ORDER — DIGOXIN 62.5 MCG PO TABS: 0.0625 mg | ORAL_TABLET | Freq: Every day | ORAL | 1 refills | Status: DC

## 2020-09-09 MED ORDER — PANTOPRAZOLE SODIUM 40 MG PO TBEC: 40.0000 mg | DELAYED_RELEASE_TABLET | Freq: Two times a day (BID) | ORAL | 1 refills | Status: AC

## 2020-09-09 MED ORDER — FERROUS SULFATE 325 (65 FE) MG PO TABS: 325.0000 mg | ORAL_TABLET | Freq: Every day | ORAL | 1 refills | Status: DC

## 2020-09-09 MED ORDER — FUROSEMIDE 40 MG PO TABS: 40.0000 mg | ORAL_TABLET | Freq: Every day | ORAL | 1 refills | Status: DC

## 2020-09-09 MED ORDER — THIAMINE HCL 100 MG PO TABS: 100.0000 mg | ORAL_TABLET | Freq: Every day | ORAL | 1 refills | Status: DC

## 2020-09-09 MED ORDER — ALTEPLASE 2 MG IJ SOLR
2.0000 mg | Freq: Once | INTRAMUSCULAR | Status: AC
  Administered 2020-09-09: 2 mg
  Filled 2020-09-09: qty 2

## 2020-09-09 MED ORDER — SUCRALFATE 1 GM/10ML PO SUSP: 1.0000 g | Freq: Four times a day (QID) | ORAL | 0 refills | Status: DC

## 2020-09-09 MED ORDER — FLUTICASONE FUROATE-VILANTEROL 200-25 MCG/INH IN AEPB: 1.0000 | INHALATION_SPRAY | Freq: Every day | RESPIRATORY_TRACT | 0 refills | Status: DC

## 2020-09-09 NOTE — Progress Notes (Incomplete)
PROGRESS NOTE    Perry Hunter  [id number] DOB: [birth year] DOA: 08/25/2020 PCP: Neale Burly, MD   Chief Complain: Leg swelling  Brief Narrative: Patient is a 35 male with history of hypertension, hyperlipidemia, diabetes type 2, GERD, COPD, obesity who presented initially to the emergency department with complaints of 2 to 65-monthhistory of increased bilateral leg swelling.  On presentation he was hypotensive, anemic with hemoglobin of 6.1, hyponatremic, had AKI, elevated BNP.  Fecal occult blood was positive.  Covid screen test was positive.  GI consulted.  He was started on steroid for Covid infection.  Transferred to PCCM service because he required pressures.  Echocardiogram done during this hospitalization showed ejection fraction of 2025%, grade thoracic dysfunction, severe right ventricular dysfunction.  Cardiology was following.  He was being managed for acute systolic congestive heart failure, cardiology saw, atrial fibrillation.  Status post TEE with failed cardioversion.  [facility]  Assessment & Plan:   Principal Problem:   GI bleed Active Problems:   Symptomatic anemia   Microcytic anemia   Elevated brain natriuretic peptide (BNP) level   Hypotension   Hyponatremia   Chronic kidney disease   Essential hypertension   Hyperlipidemia   GERD (gastroesophageal reflux disease)   COPD (chronic obstructive pulmonary disease) (HCC)   Diabetes mellitus ([place]   Obesity   Cardiogenic shock ([place]   Acute on chronic combined systolic and diastolic CHF (congestive heart failure) (HCC)   PAF (paroxysmal atrial fibrillation) (HCC)   Persistent atrial fibrillation (HCC)   Acute hypoxic respiratory failure :Multifactorial from COPD,Covid pneumonia, atelectasis.chest x-ray had shown right middle lobe/right lower lobe opacity.  Currently on 3 L of oxygen per minute.  Continue to wean the oxygen if possible.  Continue inhaled steroids, flutter valve.  On Breo  and Incruse, mobilize.  Completed 7 days course of doxycycline.  Cardiogenic shock: Continue midodrine.  Currently blood pressure soft but ok. Antibiotics have been stopped due to negative cultures.  Acute systolic congestive heart failure: Biventricular failure.  Possibly from chronic tachycardia, possible alcohol induced, A. fib.  Heart failure team following.  On  Digoxin. Now off amiodarone, milrinone Right/left heart catheterization showed nonobstructive coronary artery  Disease.  Persistent A. fib with RVR: Failed DCCV. Amiodarone drip is stopped. Now pursuing rate control strategy.  Hyponatremia: Currently stable.  Continue to monitor   Covid infection: Treated with steroids, remdesivir currently respiratory status is stable.  On 3 L of oxygen per minute.  Symptomatic anemia/microcytic anemia: Hemoglobin of 6.1 on presentation.   Currently hemoglobin stable.  On Eliquis now. Iron studies had showed low iron.  He needs iron supplementation on discharge.  He was transfused with 3 units of PRBC during this hospitalization. Underwent EGD on 08/29/2020 with finding of gastritis.  GI recommended Carafate and IV Protonix.   COPD: Not on home oxygen.  Continue supplemental oxygen as needed.  Continue bronchodilators, he is tobacco  CKD stage IV: Currently kidney function at baseline  Hyperlipidemia: On Zocor  GERD: Protonix  Diabetes type 2: Continue current insulin regimen, Tradjenta.  Monitor blood sugars  Obesity: BMI of 35.4  Urinary retention: Continue Flomax.  Continue in and out, if needed we have to reinsert the Foley.  He needs to follow-up with urology as an outpatient.  Debility/deconditioning: Mobilization encouraged.  PT/OT recommended home with on discharge.    Nutrition Problem: Inadequate oral intake Etiology: decreased appetite      DVT prophylaxis:Eliquis Code Status: Full Family Communication:  None at bedside Status is: Inpatient  Remains inpatient  appropriate because:IV treatments appropriate due to intensity of illness or inability to take PO and Inpatient level of care appropriate due to severity of illness   Dispo: The patient is from: Home              Anticipated d/c is to: Home              Anticipated d/c date is: > 3 days              Patient currently is not medically stable to d/c.   Difficult to place patient No     Consultants: PCCM, cardiology, GI  Procedures: None  Antimicrobials:  Anti-infectives (From admission, onward)   Start     Dose/Rate Route Frequency Ordered Stop   09/05/20 1100  vancomycin (VANCOREADY) IVPB 1750 mg/350 mL  Status:  Discontinued        1,750 mg 175 mL/hr over 120 Minutes Intravenous Every 24 hours 09/05/20 0838 09/06/20 0808   09/05/20 0930  ceFEPIme (MAXIPIME) 2 g in sodium chloride 0.9 % 100 mL IVPB  Status:  Discontinued        2 g 200 mL/hr over 30 Minutes Intravenous Every 8 hours 09/05/20 0838 09/07/20 1102   08/29/20 2300  doxycycline (VIBRAMYCIN) 100 mg in sodium chloride 0.9 % 250 mL IVPB        100 mg 125 mL/hr over 120 Minutes Intravenous Every 12 hours 08/29/20 2202 09/02/20 0020   08/28/20 1000  remdesivir 100 mg in sodium chloride 0.9 % 100 mL IVPB  Status:  Discontinued       "Followed by" Linked Group Details   100 mg 200 mL/hr over 30 Minutes Intravenous Daily 08/26/20 2353 08/28/20 0821   08/28/20 1000  doxycycline (VIBRA-TABS) tablet 100 mg  Status:  Discontinued        100 mg Oral Every 12 hours 08/28/20 0817 08/29/20 2202   08/28/20 1000  remdesivir 100 mg in sodium chloride 0.9 % 100 mL IVPB        100 mg 200 mL/hr over 30 Minutes Intravenous Daily 08/28/20 0823 08/29/20 1048   08/27/20 0045  remdesivir 200 mg in sodium chloride 0.9% 250 mL IVPB       "Followed by" Linked Group Details   200 mg 580 mL/hr over 30 Minutes Intravenous Once 08/26/20 2353 08/27/20 0206      Subjective: Patient seen and examined the bedside this morning.  Has trouble with  urinary retention.  Respiratory status stable, on 3 L/minute.  Blood pressure soft but stable.  Denies any new complaints.  Looks overall comfortable  Objective: Vitals:   09/08/20 1601 09/08/20 1953 09/09/20 0043 09/09/20 0416  BP: (!) 112/50 (!) 85/50 (!) 82/55 (!) 91/59  Pulse: 94 83 82 78  Resp: '18 16 14 16  '$ Temp: 98.1 F (36.7 C) 98.2 F (36.8 C) 98 F (36.7 C) 97.8 F (36.6 C)  TempSrc: Oral  Oral Oral  SpO2: 93% 96% 98% 96%  Weight:    100.1 kg  Height:        Intake/Output Summary (Last 24 hours) at 09/09/2020 0826 Last data filed at 09/09/2020 0430 Gross per 24 hour  Intake 0 ml  Output 2300 ml  Net -2300 ml   Filed Weights   09/07/20 0725 09/08/20 0634 09/09/20 0416  Weight: 99.9 kg 105.8 kg 100.1 kg    Examination:  General exam: Deconditioned, debilitated, obese  Respiratory system: Bilateral  diminished breath sounds, no wheezes or crackles  Cardiovascular system: A. fib no JVD, murmurs, rubs, gallops or clicks. No pedal edema. Gastrointestinal system: Abdomen is nondistended, soft and nontender. No organomegaly or masses felt. Normal bowel sounds heard. Central nervous system: Alert and oriented. No focal neurological deficits. Extremities: No edema, no clubbing ,no cyanosis Skin: Scattered ecchymosis, no ulcers    Data Reviewed: I have personally reviewed following labs and imaging studies  CBC: Recent Labs  Lab 09/05/20 0549 09/06/20 0409 09/07/20 1213 09/08/20 0409 09/09/20 0359  WBC 11.2* 8.9 9.7 8.0 6.3  HGB 9.1* 9.5* 9.3* 8.9* 8.4*  HCT 32.6* 32.9* 34.4* 32.0* 30.8*  MCV 67.1* 67.0* 67.2* 66.4* 66.2*  PLT 239 238 244 248 [id number]   Basic Metabolic Panel: Recent Labs  Lab 09/04/20 0437 09/05/20 0549 09/06/20 0409 09/07/20 1213 09/08/20 0409 09/09/20 0359  NA 130* 130* 132* 131* 133* 135  K 4.2 4.0 3.8 4.2 4.0 3.6  CL 92* 93* 96* 97* 97* 98  CO2 '30 28 27 26 29 28  '$ GLUCOSE 103* 123* 89 218* 106* 97  BUN '20 14 14 17 16 17  '$ CREATININE  1.06 0.91 0.81 0.91 0.79 0.78  CALCIUM 8.3* 8.5* 8.4* 8.4* 8.2* 7.9*  MG 2.1 1.9 2.0 1.6*  --  1.9   GFR: Estimated Creatinine Clearance: 97.2 mL/min (by C-G formula based on SCr of 0.78 mg/dL). Liver Function Tests: No results for input(s): AST, ALT, ALKPHOS, BILITOT, PROT, ALBUMIN in the last 168 hours. No results for input(s): LIPASE, AMYLASE in the last 168 hours. No results for input(s): AMMONIA in the last 168 hours. Coagulation Profile: No results for input(s): INR, PROTIME in the last 168 hours. Cardiac Enzymes: No results for input(s): CKTOTAL, CKMB, CKMBINDEX, TROPONINI in the last 168 hours. BNP (last 3 results) No results for input(s): PROBNP in the last 8760 hours. HbA1C: No results for input(s): HGBA1C in the last 72 hours. CBG: Recent Labs  Lab 09/08/20 0628 09/08/20 1117 09/08/20 1635 09/08/20 2114 09/09/20 0635  GLUCAP 108* 128* 138* 116* 95   Lipid Profile: No results for input(s): CHOL, HDL, LDLCALC, TRIG, CHOLHDL, LDLDIRECT in the last 72 hours. Thyroid Function Tests: No results for input(s): TSH, T4TOTAL, FREET4, T3FREE, THYROIDAB in the last 72 hours. Anemia Panel: Recent Labs    09/08/20 1134  FERRITIN 127  TIBC 258  IRON 45   Sepsis Labs: Recent Labs  Lab 09/05/20 1026 09/06/20 0409  PROCALCITON <0.10 <0.10    Recent Results (from the past 240 hour(s))  Urine Culture     Status: None   Collection Time: 08/30/20  1:21 PM   Specimen: Urine, Random  Result Value Ref Range Status   Specimen Description URINE, RANDOM  Final   Special Requests NONE  Final   Culture   Final    NO GROWTH Performed at [facility], [place] [address] [place], [place] [address]    Report Status 08/31/2020 FINAL  Final  Culture, blood (routine x 2)     Status: None (Preliminary result)   Collection Time: 09/05/20  8:55 AM   Specimen: BLOOD  Result Value Ref Range Status   Specimen Description BLOOD RIGHT ANTECUBITAL  Final   Special Requests   Final     BOTTLES DRAWN AEROBIC AND ANAEROBIC Blood Culture adequate volume   Culture   Final    NO GROWTH 3 DAYS Performed at [facility], [place] [address], [place], [place] [address]    Report  Status PENDING  Incomplete  Culture, blood (routine x 2)     Status: None (Preliminary result)   Collection Time: 09/05/20  9:00 AM   Specimen: BLOOD  Result Value Ref Range Status   Specimen Description BLOOD LEFT ANTECUBITAL  Final   Special Requests   Final    BOTTLES DRAWN AEROBIC AND ANAEROBIC Blood Culture adequate volume   Culture   Final    NO GROWTH 3 DAYS Performed at [facility], [address] [place], [place] [address]    Report Status PENDING  Incomplete  Culture, Urine     Status: None   Collection Time: 09/05/20 12:30 PM   Specimen: Urine, Random  Result Value Ref Range Status   Specimen Description URINE, RANDOM  Final   Special Requests NONE  Final   Culture   Final    NO GROWTH Performed at [facility], [place] [address], [place],  [address]    Report Status 09/06/2020 FINAL  Final         Radiology Studies: No results found.      Scheduled Meds: . (feeding supplement) PROSource Plus  30 mL Oral TID BM  . apixaban  5 mg Oral BID  . vitamin C  500 mg Oral Daily  . bethanechol  10 mg Oral TID  . Chlorhexidine Gluconate Cloth  6 each Topical Daily  . digoxin  0.0625 mg Oral Daily  . fluticasone furoate-vilanterol  1 puff Inhalation Daily  . folic acid  1 mg Oral Daily  . furosemide  40 mg Oral Daily  . insulin aspart  0-5 Units Subcutaneous QHS  . insulin aspart  0-9 Units Subcutaneous TID WC  . mouth rinse  15 mL Mouth Rinse BID  . midodrine  15 mg Oral TID WC  . multivitamin with minerals  1 tablet Oral Daily  . pantoprazole  40 mg Oral BID  . potassium chloride  40 mEq Oral Daily  . ramelteon  8 mg Oral QHS  . rosuvastatin  20 mg Oral Daily  . sodium chloride flush  10-40 mL Intracatheter Q12H  . sucralfate  1 g Oral QID  .  tamsulosin  0.8 mg Oral QPM  . thiamine  100 mg Oral Daily  . umeclidinium bromide  1 puff Inhalation Daily  . zinc sulfate  220 mg Oral Daily   Continuous Infusions: . sodium chloride Stopped (09/03/20 1317)     LOS: 15 days    Time spent: 35 mins.More than 50% of that time was spent in counseling and/or coordination of care.      Shelly Coss, MD [facility] P2/06/2021, 8:26 AM

## 2020-09-09 NOTE — Discharge Summary (Signed)
Physician Discharge Summary  Perry Hunter [id number] DOB: 1951-07-17 DOA: 08/25/2020  PCP: Neale Burly, MD  Admit date: 08/25/2020 Discharge date: 09/09/2020  Admitted From: Home Disposition:  Home  Discharge Condition:Stable CODE STATUS:FULL Diet recommendation: Heart Healthy  Brief/Interim Summary: Patient is a 70 male with history of hypertension, hyperlipidemia, diabetes type 2, GERD, COPD, obesity who presented initially to the emergency department with complaints of 2 to 95-monthhistory of increased bilateral leg swelling.  On presentation he was hypotensive, anemic with hemoglobin of 6.1, hyponatremic, had AKI, elevated BNP.  Fecal occult blood was positive.  Covid screen test was positive.  GI consulted.  He was started on steroid for Covid infection.  Transferred to PCCM service because he required pressures.  Echocardiogram done during this hospitalization showed ejection fraction of 2025%, grade thoracic dysfunction, severe right ventricular dysfunction.  Cardiology was following.  He was being managed for acute systolic congestive heart failure, cardiology saw, atrial fibrillation.  Status post TEE with failed cardioversion.  [facility] Patient's overall status has improved.  Currently his blood pressure and heart rate are  much better.  PT/OT recommend home health on discharge.  Cardiology cleared for discharge to home today.  He qualified for home oxygen.  Following problems were addressed during his hospitalization:   Acute hypoxic respiratory failure /respiratory insufficiency:Multifactorial from COPD,Covid pneumonia, atelectasis.chest x-ray had shown right middle lobe/right lower lobe opacity.  Currently on 2 L of oxygen per minute.   On Breo and Incruse.  Completed 7 days course of doxycycline.  Qualified for home oxygen  Cardiogenic shock: Continue midodrine.  Currently blood pressure soft but ok. Antihypertensive discontinued.  Acute  systolic congestive heart failure: Biventricular failure.  Possibly from chronic tachycardia, possible alcohol induced, A. fib.  Heart failure team following.  On digoxin.He was treated with milrinone during this hospitalization. Right/left heart catheterization showed nonobstructive coronary artery  Disease.  Persistent A. fib with RVR: failed cardioversion. Amiodarone D/Ced. Plan is for  rate control   Covid infection: Treated with steroids, remdesivir currently respiratory status is stable.  On 2 L of oxygen per minute.  Continue inhalersContinue inahlers  Symptomatic anemia/microcytic anemia: Hemoglobin of 6.1 on presentation.  Currently hemoglobin stable.  On Eliquis now. Iron studies had showed low iron.  He needs iron supplementation on discharge.  He was transfused with 3 units of PRBC during this hospitalization. Underwent EGD on 08/29/2020 with finding of severe grade D erosive esophagitis with bleeding.  GI recommended Carafate and Protonix. We recommend to follow-up with GI as an outpatient and 8 to 10 weeks for follow-up EGD.  Check CBC in a week  COPD: Not on home oxygen.    Not in exacerbation.  AKI: Resolved  Hyperlipidemia: On crestor  Diabetes type 2: Continue home regimen  Obesity: BMI of 35.4  Urinary retention: Continue Flomax.    Foley had to be inserted twice during this hospitalization.  He needs to follow-up with urology as an outpatient.  He will be discharged on Foley catheter.  Debility/deconditioning: PT/OT recommended home with on discharge.    Discharge Diagnoses:  Principal Problem:   GI bleed Active Problems:   Symptomatic anemia   Microcytic anemia   Elevated brain natriuretic peptide (BNP) level   Hypotension   Hyponatremia   Chronic kidney disease   Essential hypertension   Hyperlipidemia   GERD (gastroesophageal reflux disease)   COPD (chronic obstructive pulmonary disease) (HCC)   Diabetes mellitus ([place]   Obesity  Cardiogenic  shock (HCC)   Acute on chronic combined systolic and diastolic CHF (congestive heart failure) (HCC)   PAF (paroxysmal atrial fibrillation) ([place])   Persistent atrial fibrillation [facility])    Discharge Instructions  Discharge Instructions    Diet - low sodium heart healthy   Complete by: As directed    Discharge instructions   Complete by: As directed    1)Please take prescribed medications as instructed. 2)Follow up with your PCP in a week.  Do a CBC, BMP tests during the follow up 3) you are being discharged with Foley.  Follow up with urology as an outpatient for consideration of removal.  Name and number the provider group has been attached.  Call for appointment in a week. 4)Follow up with gastroenterology in 3 months. Name and  number the provider has been attached,call for appointment 5)Follow up with cardiology in 2 weeks as an outpatient. 6)Follow up with home health   Increase activity slowly   Complete by: As directed    No wound care   Complete by: As directed      Allergies as of 09/09/2020   No Known Allergies     Medication List    STOP taking these medications   amiodarone 200 MG tablet Commonly known as: PACERONE   benazepril 40 MG tablet Commonly known as: LOTENSIN   fluticasone 220 MCG/ACT inhaler Commonly known as: FLOVENT HFA   hydrochlorothiazide 25 MG tablet Commonly known as: HYDRODIURIL   metoprolol succinate 25 MG 24 hr tablet Commonly known as: TOPROL-XL   omeprazole 40 MG capsule Commonly known as: PRILOSEC   simvastatin 10 MG tablet Commonly known as: ZOCOR     TAKE these medications   acetaminophen 650 MG CR tablet Commonly known as: TYLENOL Take 1,300 mg by mouth daily.   Digoxin 62.5 MCG Tabs Take 0.0625 mg by mouth daily. Start taking on: September 10, 2020   Eliquis 5 MG Tabs tablet Generic drug: apixaban Take 5 mg by mouth 2 (two) times daily.   Fish Oil 1000 MG Caps Take 1 capsule by mouth daily.   fluticasone  furoate-vilanterol 200-25 MCG/INH Aepb Commonly known as: BREO ELLIPTA Inhale 1 puff into the lungs daily. Start taking on: February 13, 123456   folic acid 1 MG tablet Commonly known as: FOLVITE Take 1 tablet (1 mg total) by mouth daily. Start taking on: September 10, 2020   furosemide 40 MG tablet Commonly known as: LASIX Take 1 tablet (40 mg total) by mouth daily. Start taking on: September 10, 2020 What changed:   medication strength  how much to take   HYDROcodone-acetaminophen 5-325 MG tablet Commonly known as: NORCO/VICODIN Take 1 tablet by mouth 3 (three) times daily as needed for pain.   insulin detemir 100 UNIT/ML injection Commonly known as: LEVEMIR Inject 40 Units into the skin at bedtime.   metFORMIN 500 MG tablet Commonly known as: GLUCOPHAGE Take 1,000 mg by mouth daily with breakfast.   midodrine 5 MG tablet Commonly known as: PROAMATINE Take 3 tablets (15 mg total) by mouth 3 (three) times daily with meals.   pantoprazole 40 MG tablet Commonly known as: PROTONIX Take 1 tablet (40 mg total) by mouth 2 (two) times daily.   potassium chloride SA 20 MEQ tablet Commonly known as: KLOR-CON Take 2 tablets (40 mEq total) by mouth daily. Start taking on: September 10, 2020   ProAir HFA 108 (90 Base) MCG/ACT inhaler Generic drug: albuterol Inhale 2 puffs into the lungs 4 (four)  times daily as needed for shortness of breath.   rosuvastatin 20 MG tablet Commonly known as: CRESTOR Take 1 tablet (20 mg total) by mouth daily. Start taking on: September 10, 2020   sitaGLIPtin 100 MG tablet Commonly known as: JANUVIA Take 100 mg by mouth daily.   sucralfate 1 GM/10ML suspension Commonly known as: CARAFATE Take 10 mLs (1 g total) by mouth 4 (four) times daily for 16 days.   tamsulosin 0.4 MG Caps capsule Commonly known as: FLOMAX Take 0.8 mg by mouth every evening.   thiamine 100 MG tablet Take 1 tablet (100 mg total) by mouth daily. Start taking on:  September 10, 2020   umeclidinium bromide 62.5 MCG/INH Aepb Commonly known as: INCRUSE ELLIPTA Inhale 1 puff into the lungs daily. Start taking on: September 10, 2020   Vitamin D3 25 MCG (1000 UT) Caps Take 1 capsule by mouth daily.            Durable Medical Equipment  (From admission, onward)         Start     Ordered   09/09/20 1246  For home use only DME oxygen  Once       Question Answer Comment  Length of Need Lifetime   Mode or (Route) Nasal cannula   Liters per Minute 2   Frequency Continuous (stationary and portable oxygen unit needed)   Oxygen delivery system Gas      09/09/20 1245   09/09/20 1150  For home use only DME Walker rolling  Once       Question Answer Comment  Walker: With [place] Wheels   Patient needs a walker to treat with the following condition Balance disorder      09/09/20 1149   09/09/20 1149  For home use only DME 3 n 1  Once        09/09/20 1149          Follow-up Information    Hasanaj, Samul Dada, MD. Schedule an appointment as soon as possible for a visit in 1 week(s).   Specialty: Internal Medicine Contact information: [address] [place] [id number] [phone]        Bensimhon, Shaune Pascal, MD. Schedule an appointment as soon as possible for a visit in 2 week(s).   Specialty: Cardiology Contact information: [address] [place] [address] [facility].   Why: Someone from the home health agency will be in contact with you after discharge to arrange your first physical therapy appointment.       Mansouraty, Telford Nab., MD. Schedule an appointment as soon as possible for a visit in 8 week(s).   Specialties: Gastroenterology, Internal Medicine Contact information: [place] [address] [phone]        [facility]. Schedule an appointment as soon as possible for a visit in 1 week(s).   Contact information: [place] [phone]             No Known Allergies  Consultations:  Cardiology,PCCM,GI   Procedures/Studies: DG Chest 1 View  Result Date: 08/31/2020 CLINICAL DATA:  Dyspnea, pulmonary edema EXAM: CHEST  1 VIEW COMPARISON:  08/27/2020 FINDINGS: Left subclavian central venous catheter tip is again seen within the superior vena cava. Pulmonary insufflation is normal and symmetric. Perihilar pulmonary edema has improved in the interval  since prior examination, now mild in severity. No pneumothorax or pleural effusion. Mild cardiomegaly is stable. No acute bone abnormality. IMPRESSION: Stable cardiomegaly. Improving pulmonary edema. Resolved right pleural effusion. Electronically Signed   By: Fidela Salisbury MD   On: 08/31/2020 08:20   DG Chest 2 View  Result Date: 08/25/2020 CLINICAL DATA:  Bilateral leg swelling and abdominal swelling for 3 months. History of asthma and CHF. EXAM: CHEST - 2 VIEW COMPARISON:  None. FINDINGS: Cardiac silhouette is mildly enlarged. No mediastinal or hilar masses or evidence of adenopathy. Dense opacity with volume loss lies in the right middle lobe consistent with atelectasis. Additional opacity noted at the right lung base consistent with atelectasis possibly with a small effusion. Mild linear atelectasis at the lateral left lung base. Mild pleuroparenchymal scarring at the apices. Remainder of the lungs is clear. Elevated right hemidiaphragm. No pneumothorax. Skeletal structures are intact. IMPRESSION: 1. Opacity with volume loss the right middle lobe most consistent with atelectasis. No visualized mass although a central bronchial obstruction is not excluded radiographically. 2. Mild opacity at the base of the right lower lobe, most likely atelectasis. Possible small right pleural effusion. Pneumonia is not excluded. No other evidence of acute cardiopulmonary disease. 3. Mild cardiomegaly.  No evidence of congestive heart  failure. Electronically Signed   By: Lajean Manes M.D.   On: 08/25/2020 15:19   US Abdomen Complete  Result Date: 08/27/2020 CLINICAL DATA:  Abdominal distension EXAM: ABDOMEN ULTRASOUND COMPLETE COMPARISON:  None FINDINGS: Gallbladder: Normally distended without calculi. Mild gallbladder wall thickening present. No pericholecystic fluid or sonographic Murphy sign. Common bile duct: Diameter: 4 mm, normal Liver: Normal echogenicity without mass or nodularity. Portal vein is patent on color Doppler imaging with normal direction of blood flow towards the liver. IVC: Normal appearance Pancreas: Fatty replacement of pancreas. Small portion of pancreatic body and proximal tail normal appearance, remainder obscured by bowel gas. Spleen: Poorly visualized, due to gas at lung bases and bowel, 8.2 cm length. Right Kidney: Length: 11.8 cm. Normal cortical echogenicity. Diffuse cortical thinning. No mass or hydronephrosis. Left Kidney: Length: 12.2 cm. Cortical thinning. Normal cortical echogenicity. No mass or hydronephrosis. Abdominal aorta: Visualized portions normal caliber, additional portions obscured by bowel gas Other findings: No free fluid IMPRESSION: Mild nonspecific gallbladder wall thickening without calculi, sludge, or sonographic Murphy sign. Incomplete pancreatic and aortic visualization. Remainder of exam unremarkable. Electronically Signed   By: Lavonia Dana M.D.   On: 08/27/2020 10:27   CT ABDOMEN PELVIS W CONTRAST  Result Date: 08/30/2020 CLINICAL DATA:  Anemia, patient is not a colonoscopy candidate, assess for intestinal malignancy or other abnormality. EXAM: CT ABDOMEN AND PELVIS WITH CONTRAST TECHNIQUE: Multidetector CT imaging of the abdomen and pelvis was performed using the standard protocol following bolus administration of intravenous contrast. CONTRAST:  12m OMNIPAQUE IOHEXOL 300 MG/ML  SOLN COMPARISON:  None. FINDINGS: Lower chest: Moderate right, small left pleural effusions and  associated atelectasis or consolidation. Cardiomegaly. Coronary artery calcifications. Hepatobiliary: No solid liver abnormality is seen. No gallstones, gallbladder wall thickening, or biliary dilatation. Pancreas: Unremarkable. No pancreatic ductal dilatation or surrounding inflammatory changes. Spleen: Normal in size without significant abnormality. Adrenals/Urinary Tract: Adrenal glands are unremarkable. Kidneys are normal, without renal calculi, solid lesion, or hydronephrosis. Foley catheter within the urinary bladder. Stomach/Bowel: Stomach is within normal limits. Appendix appears normal. No evidence of bowel wall thickening, distention, or inflammatory changes. Occasional sigmoid diverticula Vascular/Lymphatic: Aortic atherosclerosis. No enlarged abdominal or pelvic lymph nodes. Reproductive: Prostatomegaly. Other:  No abdominal wall hernia or abnormality. No abdominopelvic ascites. Musculoskeletal: No acute or significant osseous findings. IMPRESSION: 1. No CT findings of the abdomen or pelvis to explain anemia. No mass or other abnormality of the bowel identified. 2. Occasional sigmoid diverticula without evidence of acute diverticulitis. 3. Prostatomegaly. 4. Moderate right, small left pleural effusions and associated atelectasis or consolidation. 5. Coronary artery disease. Aortic Atherosclerosis (ICD10-I70.0). Electronically Signed   By: Eddie Candle M.D.   On: 08/30/2020 14:24   CARDIAC CATHETERIZATION  Result Date: 09/01/2020  Prox RCA lesion is 99% stenosed.  Mid LAD to Dist LAD lesion is 40% stenosed.  Prox LAD to Mid LAD lesion is 30% stenosed.  Mid Cx lesion is 100% stenosed.  Dist Cx lesion is 90% stenosed.  Findings: Ao = 102/65 (80) LV =  93/25 RA = 14 RV = 52/15 PA = 53/19 (35) PCW = 28 Fick cardiac output/index = 6.0/2.8 PVR = 1.1 WU Ao sat = 93% PA sat = 51%, 54% Assessment: 1. 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  Elevated filling pressures with preserved CO Plan/Discussion: Medical therapy. Continue diuresis. Glori Bickers, MD 7:45 PM   DG CHEST PORT 1 VIEW  Result Date: 09/05/2020 CLINICAL DATA:  Hypotension/shock EXAM: PORTABLE CHEST 1 VIEW COMPARISON:  August 31, 2020 FINDINGS: There is no edema or airspace opacity. There is cardiomegaly with pulmonary vascularity normal. No adenopathy. There is aortic atherosclerosis. Central catheter tip is in the superior vena cava. No pneumothorax. No bone lesions. IMPRESSION: Stable cardiomegaly. No edema or airspace opacity. Central catheter tip in superior vena cava. Aortic Atherosclerosis (ICD10-I70.0). Electronically Signed   By: Lowella Grip III M.D.   On: 09/05/2020 09:40   DG CHEST PORT 1 VIEW  Result Date: 08/27/2020 CLINICAL DATA:  Central line placement EXAM: PORTABLE CHEST 1 VIEW COMPARISON:  Cxr 08/25/20 FINDINGS: Interval placement of a left subclavian central venous catheter with tip overlying the expected region of the confluence of the left brachiocephalic vein and superior vena cava. Similar-appearing marked cardiomegaly. Cardiomediastinal silhouette is unchanged. Aortic arch calcification. Redemonstration of right middle lobe opacity. Increased interstitial markings. Suggestion of interval development of an at least trace to small volume right pleural effusion. No definite left pleural effusion. No pneumothorax. No acute osseous abnormality. IMPRESSION: 1. Interval placement of left subclavian central venous catheter in grossly appropriate position. 2. Marked cardiomegaly with pulmonary edema and interval development of an least trace to small volume right pleural effusion. Cannot exclude a pericardial effusion. 3. Bilateral vague patchy airspace opacities could represent atelectasis versus superimposed infection/inflammation. Electronically Signed   By: Iven Finn M.D.   On: 08/27/2020 00:24   ECHOCARDIOGRAM LIMITED  Result Date: 08/26/2020     ECHOCARDIOGRAM LIMITED REPORT   Patient Name:   JENIFER MCCANDLISH Date of Exam: 08/26/2020 Medical Rec #:  [id number]     Height:       68.0 in Accession #:    [id number]    Weight:       250.0 lb Date of Birth:  [birth year]     BSA:          2.247 m Patient Age:    62 years      BP:           112/80 mmHg Patient Gender: M             HR:           92  bpm. Exam Location:  [facility] Procedure: Limited Echo, Cardiac Doppler and Color Doppler Indications:    CHF  History:        Patient has no prior history of Echocardiogram examinations.                 CHF, COPD, Signs/Symptoms:LE edema; Risk Factors:Hypertension,                 Diabetes, Dyslipidemia, Former Smoker and Obesity.  Sonographer:    Dustin Flock RDCS Referring Phys: [id number] OLADAPO ADEFESO  Sonographer Comments: Patient is morbidly obese. Image acquisition challenging due to COPD and Image acquisition challenging due to patient body habitus. COVID+ IMPRESSIONS  1. Left ventricular ejection fraction, by estimation, is 20 to 25%. The left ventricle has severely decreased function. The left ventricle demonstrates global hypokinesis. The left ventricular internal cavity size was mildly dilated. Left ventricular diastolic parameters are consistent with Grade III diastolic dysfunction (restrictive). Elevated left atrial pressure.  2. Right ventricular systolic function is severely reduced. The right ventricular size is normal. There is mildly elevated pulmonary artery systolic pressure. The estimated right ventricular systolic pressure is [id number] mmHg.  3. Left atrial size was moderately dilated.  4. Right atrial size was moderately dilated.  5. A small pericardial effusion is present. The pericardial effusion is circumferential.  6. The mitral valve is normal in structure. No evidence of mitral valve regurgitation.  7. The aortic valve is normal in structure. Aortic valve regurgitation is not visualized. Mild aortic valve sclerosis is present, with no evidence  of aortic valve stenosis.  8. The inferior vena cava is dilated in size with <50% respiratory variability, suggesting right atrial pressure of 15 mmHg. FINDINGS  Left Ventricle: Left ventricular ejection fraction, by estimation, is 20 to 25%. The left ventricle has severely decreased function. The left ventricle demonstrates global hypokinesis. The left ventricular internal cavity size was mildly dilated. There is no left ventricular hypertrophy. Left ventricular diastolic parameters are consistent with Grade III diastolic dysfunction (restrictive). Elevated left atrial pressure. Right Ventricle: The right ventricular size is normal. No increase in right ventricular wall thickness. Right ventricular systolic function is severely reduced. There is mildly elevated pulmonary artery systolic pressure. The tricuspid regurgitant velocity is 2.71 m/s, and with an assumed right atrial pressure of 15 mmHg, the estimated right ventricular systolic pressure is [id number] mmHg. Left Atrium: Left atrial size was moderately dilated. Right Atrium: Right atrial size was moderately dilated. Pericardium: A small pericardial effusion is present. The pericardial effusion is circumferential. Mitral Valve: The mitral valve is normal in structure. Tricuspid Valve: The tricuspid valve is normal in structure. Tricuspid valve regurgitation is trivial. Aortic Valve: The aortic valve is normal in structure. Aortic valve regurgitation is not visualized. Mild aortic valve sclerosis is present, with no evidence of aortic valve stenosis. Pulmonic Valve: The pulmonic valve was not well visualized. Aorta: The aortic root is normal in size and structure. Venous: The inferior vena cava is dilated in size with less than 50% respiratory variability, suggesting right atrial pressure of 15 mmHg. IAS/Shunts: No atrial level shunt detected by color flow Doppler. LEFT VENTRICLE PLAX 2D LVIDd:         5.61 cm  Diastology LVIDs:         4.72 cm  LV e' medial:     4.79 cm/s LV PW:         1.19 cm  LV E/e' medial:  25.3 LV IVS:  1.11 cm  LV e' lateral:   12.70 cm/s LVOT diam:     2.30 cm  LV E/e' lateral: 9.5 LVOT Area:     4.15 cm  LEFT ATRIUM         Index LA diam:    4.80 cm 2.14 cm/m   AORTA Ao Root diam: 3.20 cm MITRAL VALVE                TRICUSPID VALVE MV Area (PHT): 6.37 cm     TR Peak grad:   29.4 mmHg MV Decel Time: 119 msec     TR Vmax:        271.00 cm/s MV E velocity: 121.00 cm/s MV A velocity: 63.70 cm/s   SHUNTS MV E/A ratio:  1.90         Systemic Diam: 2.30 cm Dani Gobble Croitoru MD Electronically signed by Sanda Klein MD Signature Date/Time: 08/26/2020/1:56:00 PM    Final    [place] EKG SITE RITE  Result Date: 08/26/2020 If Site Rite image not attached, placement could not be confirmed due to current cardiac rhythm.      Subjective: Patient seen and examined at bedside this morning.  Medically stable for discharge home today.  Discharge Exam: Vitals:   09/09/20 0800 09/09/20 0848  BP: (!) 111/52   Pulse: 90   Resp: 15   Temp: 98 F (36.7 C)   SpO2: 96% 92%   Vitals:   09/09/20 0043 09/09/20 0416 09/09/20 0800 09/09/20 0848  BP: (!) 82/55 (!) 91/59 (!) 111/52   Pulse: 82 78 90   Resp: '14 16 15   '$ Temp: 98 F (36.7 C) 97.8 F (36.6 C) 98 F (36.7 C)   TempSrc: Oral Oral    SpO2: 98% 96% 96% 92%  Weight:  100.1 kg    Height:        General: Pt is alert, awake, not in acute distress Cardiovascular: Irregularly irregular rhythm, no rubs, no gallops Respiratory: CTA bilaterally, no wheezing, no rhonchi Abdominal: Soft, NT, ND, bowel sounds + Extremities: no edema, no cyanosis    The results of significant diagnostics from this hospitalization (including imaging, microbiology, ancillary and laboratory) are listed below for reference.     Microbiology: Recent Results (from the past 240 hour(s))  Urine Culture     Status: None   Collection Time: 08/30/20  1:21 PM   Specimen: Urine, Random  Result Value Ref Range  Status   Specimen Description URINE, RANDOM  Final   Special Requests NONE  Final   Culture   Final    NO GROWTH Performed at [facility], [address], [place], [place] [address]    Report Status 08/31/2020 FINAL  Final  Culture, blood (routine x 2)     Status: None (Preliminary result)   Collection Time: 09/05/20  8:55 AM   Specimen: BLOOD  Result Value Ref Range Status   Specimen Description BLOOD RIGHT ANTECUBITAL  Final   Special Requests   Final    BOTTLES DRAWN AEROBIC AND ANAEROBIC Blood Culture adequate volume   Culture   Final    NO GROWTH 3 DAYS Performed at [facility], [address], [place], [place] [address]    Report Status PENDING  Incomplete  Culture, blood (routine x 2)     Status: None (Preliminary result)   Collection Time: 09/05/20  9:00 AM   Specimen: BLOOD  Result Value Ref Range Status   Specimen Description BLOOD LEFT  ANTECUBITAL  Final   Special Requests   Final    BOTTLES DRAWN AEROBIC AND ANAEROBIC Blood Culture adequate volume   Culture   Final    NO GROWTH 3 DAYS Performed at [facility], [address], [place], [place] [address]    Report Status PENDING  Incomplete  Culture, Urine     Status: None   Collection Time: 09/05/20 12:30 PM   Specimen: Urine, Random  Result Value Ref Range Status   Specimen Description URINE, RANDOM  Final   Special Requests NONE  Final   Culture   Final    NO GROWTH Performed at [facility], [place] [address] [place], [place] [address]    Report Status 09/06/2020 FINAL  Final     Labs: BNP (last 3 results) Recent Labs    08/25/20 1452  BNP [id number]*   Basic Metabolic Panel: Recent Labs  Lab 09/04/20 0437 09/05/20 0549 09/06/20 0409 09/07/20 1213 09/08/20 0409 09/09/20 0359  NA 130* 130* 132* 131* 133* 135  K 4.2 4.0 3.8 4.2 4.0 3.6  CL 92* 93* 96* 97* 97* 98  CO2 '30 28 27 26 29 28  '$ GLUCOSE 103* 123* 89 218* 106* 97  BUN '20 14 14 17 16 17  '$ CREATININE 1.06 0.91 0.81  0.91 0.79 0.78  CALCIUM 8.3* 8.5* 8.4* 8.4* 8.2* 7.9*  MG 2.1 1.9 2.0 1.6*  --  1.9   Liver Function Tests: No results for input(s): AST, ALT, ALKPHOS, BILITOT, PROT, ALBUMIN in the last 168 hours. No results for input(s): LIPASE, AMYLASE in the last 168 hours. No results for input(s): AMMONIA in the last 168 hours. CBC: Recent Labs  Lab 09/05/20 0549 09/06/20 0409 09/07/20 1213 09/08/20 0409 09/09/20 0359  WBC 11.2* 8.9 9.7 8.0 6.3  HGB 9.1* 9.5* 9.3* 8.9* 8.4*  HCT 32.6* 32.9* 34.4* 32.0* 30.8*  MCV 67.1* 67.0* 67.2* 66.4* 66.2*  PLT 239 238 244 248 264   Cardiac Enzymes: No results for input(s): CKTOTAL, CKMB, CKMBINDEX, TROPONINI in the last 168 hours. BNP: Invalid input(s): POCBNP CBG: Recent Labs  Lab 09/08/20 1117 09/08/20 1635 09/08/20 2114 09/09/20 0635 09/09/20 1149  GLUCAP 128* 138* 116* 95 137*   D-Dimer No results for input(s): DDIMER in the last 72 hours. Hgb A1c No results for input(s): HGBA1C in the last 72 hours. Lipid Profile No results for input(s): CHOL, HDL, LDLCALC, TRIG, CHOLHDL, LDLDIRECT in the last 72 hours. Thyroid function studies No results for input(s): TSH, T4TOTAL, T3FREE, THYROIDAB in the last 72 hours.  Invalid input(s): FREET3 Anemia work up Recent Labs    09/08/20 1134  FERRITIN 127  TIBC 258  IRON 45   Urinalysis    Component Value Date/Time   COLORURINE YELLOW 08/30/2020 [place] 08/30/2020 1445   LABSPEC 1.038 (H) 08/30/2020 1445   PHURINE 6.0 08/30/2020 1445   GLUCOSEU NEGATIVE 08/30/2020 1445   HGBUR SMALL (A) 08/30/2020 1445   BILIRUBINUR NEGATIVE 08/30/2020 1445   KETONESUR NEGATIVE 08/30/2020 1445   PROTEINUR NEGATIVE 08/30/2020 1445   NITRITE NEGATIVE 08/30/2020 1445   LEUKOCYTESUR NEGATIVE 08/30/2020 1445   Sepsis Labs Invalid input(s): PROCALCITONIN,  WBC,  LACTICIDVEN Microbiology Recent Results (from the past 240 hour(s))  Urine Culture     Status: None   Collection Time:  08/30/20  1:21 PM   Specimen: Urine, Random  Result Value Ref Range Status   Specimen Description URINE, RANDOM  Final   Special Requests NONE  Final   Culture   Final    NO GROWTH Performed at [facility], [place] [address] [place], [place] [address]    Report Status 08/31/2020 FINAL  Final  Culture, blood (routine x 2)     Status: None (Preliminary result)   Collection Time: 09/05/20  8:55 AM   Specimen: BLOOD  Result Value Ref Range Status   Specimen Description BLOOD RIGHT ANTECUBITAL  Final   Special Requests   Final    BOTTLES DRAWN AEROBIC AND ANAEROBIC Blood Culture adequate volume   Culture   Final    NO GROWTH 3 DAYS Performed at [facility], [place] [address] [place], [place] [address]    Report Status PENDING  Incomplete  Culture, blood (routine x 2)     Status: None (Preliminary result)   Collection Time: 09/05/20  9:00 AM   Specimen: BLOOD  Result Value Ref Range Status   Specimen Description BLOOD LEFT ANTECUBITAL  Final   Special Requests   Final    BOTTLES DRAWN AEROBIC AND ANAEROBIC Blood Culture adequate volume   Culture   Final    NO GROWTH 3 DAYS Performed at [facility], [place] [address] [place], [place] [address]    Report Status PENDING  Incomplete  Culture, Urine     Status: None   Collection Time: 09/05/20 12:30 PM   Specimen: Urine, Random  Result Value Ref Range Status   Specimen Description URINE, RANDOM  Final   Special Requests NONE  Final   Culture   Final    NO GROWTH Performed at [facility], [place] [address] [place],  [address]    Report Status 09/06/2020 FINAL  Final    Please note: You were cared for by a hospitalist during your hospital stay. Once you are discharged, your primary care physician will handle any further medical issues. Please note that NO REFILLS for any discharge medications will be authorized once you are discharged, as it is imperative that you return to your primary care physician (or  establish a relationship with a primary care physician if you do not have one) for your post hospital discharge needs so that they can reassess your need for medications and monitor your lab values.    Time coordinating discharge: 40 minutes  SIGNED:   Shelly Coss, MD  [facility] 09/09/2020, 1:14 PM Pager [phone]  If 7PM-7AM, please contact night-coverage [email or web address] Password [email or web address]

## 2020-09-09 NOTE — Progress Notes (Addendum)
Pt has been D/C today. Received referral for HHPT, RN, home O2, RW, and 3-in-1 BSC. Pt plans to return home with the support of his daughter who lives with him. Discussed HH and DME referral. He doesn't have a preference for a [place] agency. Will contact [facility] for [facility] referral and he is agreeable. Contacted Jason with [facility] for [facility] PT/RN and he accepted the referral. Contacted Jermaine with [employer] for DME (RW, 3-in-BSC and O2).

## 2020-09-09 NOTE — Progress Notes (Signed)
SATURATION QUALIFICATIONS: (This note is used to comply with regulatory documentation for home oxygen)  Patient Saturations on Room Air at Rest = 91%  Patient Saturations on Room Air while Ambulating = 80%  Patient Saturations on 2 Liters of oxygen while Ambulating = 92%  Please briefly explain why patient needs home oxygen: pt O2 declines with exertion when standing and while at rest. Pt desat this am to 77% while asleep and when tested standing to 80%

## 2020-09-09 NOTE — Plan of Care (Signed)
  Problem: Clinical Measurements: Goal: Will remain free from infection Outcome: Progressing Goal: Diagnostic test results will improve Outcome: Progressing Goal: Respiratory complications will improve Outcome: Progressing Goal: Cardiovascular complication will be avoided Outcome: Progressing   

## 2020-09-09 NOTE — Progress Notes (Signed)
No blood return from subclavian CVC. Per RN, line is being used for CVP. Alteplase ordered to treat line.

## 2020-09-09 NOTE — Progress Notes (Signed)
Patient ID: Perry Hunter, male   DOB: [birth year], 70 y.o.   MRN: [id number]    Advanced Heart Failure Rounding Note   Subjective:    08/29/20 S/P EGD - gastritis. Recommendations for carafate +IV protonix.    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 Elevated filling pressures with preserved CO.  2/10 Failure cardioversion.   Feeling better. Denies CP or SOB. IV amio stopped yesterday after failed DC-CV.    Central line clotted. Getting t-pa to line. Am Co-ox 59% HRs in 90s  SBPs still 80-90s at times on midodrine 15 tid  Objective:   Weight Range:  Vital Signs:   Temp:  [97.8 F (36.6 C)-98.2 F (36.8 C)] 98 F (36.7 C) (02/12 0800) Pulse Rate:  [78-94] 90 (02/12 0800) Resp:  [14-18] 16 (02/12 0416) BP: (82-112)/(50-64) 111/52 (02/12 0800) SpO2:  [92 %-98 %] 92 % (02/12 0848) FiO2 (%):  [28 %] 28 % (02/11 1417) Weight:  [100.1 kg] 100.1 kg (02/12 0416) Last BM Date: 09/08/20  Weight change: Filed Weights   09/07/20 0725 09/08/20 0634 09/09/20 0416  Weight: 99.9 kg 105.8 kg 100.1 kg    Intake/Output:   Intake/Output Summary (Last 24 hours) at 09/09/2020 1047 Last data filed at 09/09/2020 0430 Gross per 24 hour  Intake 0 ml  Output 1900 ml  Net -1900 ml     PHYSICAL EXAM: General:  Well appearing. No resp difficulty HEENT: normal Neck: supple. no JVD. Carotids 2+ bilat; no bruits. No lymphadenopathy or thryomegaly appreciated. Cor: PMI nondisplaced. Irregular rate & rhythm. No rubs, gallops or murmurs. Lungs: clear Abdomen: obese soft, nontender, nondistended. No hepatosplenomegaly. No bruits or masses. Good bowel sounds. Extremities: no cyanosis, clubbing, rash, edema Neuro: alert & orientedx3, cranial nerves grossly intact. moves all 4 extremities w/o difficulty. Affect pleasant  Telemetry:   A fib 80-90s Personally reviewed  Labs: Basic Metabolic Panel: Recent Labs  Lab  09/04/20 0437 09/05/20 0549 09/06/20 0409 09/07/20 1213 09/08/20 0409 09/09/20 0359  NA 130* 130* 132* 131* 133* 135  K 4.2 4.0 3.8 4.2 4.0 3.6  CL 92* 93* 96* 97* 97* 98  CO2 '30 28 27 26 29 28  '$ GLUCOSE 103* 123* 89 218* 106* 97  BUN '20 14 14 17 16 17  '$ CREATININE 1.06 0.91 0.81 0.91 0.79 0.78  CALCIUM 8.3* 8.5* 8.4* 8.4* 8.2* 7.9*  MG 2.1 1.9 2.0 1.6*  --  1.9    Liver Function Tests: No results for input(s): AST, ALT, ALKPHOS, BILITOT, PROT, ALBUMIN in the last 168 hours. No results for input(s): LIPASE, AMYLASE in the last 168 hours. No results for input(s): AMMONIA in the last 168 hours.  CBC: Recent Labs  Lab 09/05/20 0549 09/06/20 0409 09/07/20 1213 09/08/20 0409 09/09/20 0359  WBC 11.2* 8.9 9.7 8.0 6.3  HGB 9.1* 9.5* 9.3* 8.9* 8.4*  HCT 32.6* 32.9* 34.4* 32.0* 30.8*  MCV 67.1* 67.0* 67.2* 66.4* 66.2*  PLT 239 238 244 248 264    Cardiac Enzymes: No results for input(s): CKTOTAL, CKMB, CKMBINDEX, TROPONINI in the last 168 hours.  BNP: BNP (last 3 results) Recent Labs    08/25/20 1452  BNP 527.0*    ProBNP (last 3 results) No results for input(s): PROBNP in the last 8760 hours.    Other results:  Imaging: No results found.   Medications:     Scheduled Medications: . (feeding supplement) PROSource Plus  30 mL Oral TID BM  .  apixaban  5 mg Oral BID  . vitamin C  500 mg Oral Daily  . bethanechol  10 mg Oral TID  . Chlorhexidine Gluconate Cloth  6 each Topical Daily  . digoxin  0.0625 mg Oral Daily  . fluticasone furoate-vilanterol  1 puff Inhalation Daily  . folic acid  1 mg Oral Daily  . furosemide  40 mg Oral Daily  . insulin aspart  0-5 Units Subcutaneous QHS  . insulin aspart  0-9 Units Subcutaneous TID WC  . mouth rinse  15 mL Mouth Rinse BID  . midodrine  15 mg Oral TID WC  . multivitamin with minerals  1 tablet Oral Daily  . pantoprazole  40 mg Oral BID  . potassium chloride  40 mEq Oral Daily  . ramelteon  8 mg Oral QHS  .  rosuvastatin  20 mg Oral Daily  . sodium chloride flush  10-40 mL Intracatheter Q12H  . sucralfate  1 g Oral QID  . tamsulosin  0.8 mg Oral QPM  . thiamine  100 mg Oral Daily  . umeclidinium bromide  1 puff Inhalation Daily  . zinc sulfate  220 mg Oral Daily    Infusions: . sodium chloride Stopped (09/03/20 1317)    PRN Medications: acetaminophen, albuterol, dextromethorphan-guaiFENesin, loperamide HCl, ondansetron (ZOFRAN) IV, sodium chloride flush   Assessment/Plan:    1. 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 Elevated filling pressures with preserved CO (on milrinone).  - Plan for medical management of CAD.  - Off milrinone. CO-OX 59% - Volume status ok. Continue lasix 40 mg po daily.   - No ARB/Spiro/b-blocker due to hypotension.  - Continue midodrine for BP support.  - Continue digoxin 0.125 daily  2. Acute hypoxic respiratory failure in setting of COVID PNA /AECOPD - Unvacinated CXR shows RML/RLL opacity.  Requiring 3L of oxygen via [place].  - Treatment per CCM: has had Remdesivr, off steroids now. Completed 7 days of doxycycline.   - COVID precautions lifted 2/7 - much improved  3. Persistent AF with RVR - has h/o AF at home. Unclear if this is chronic or not. Given that he is on amio at home suspect not chronic  - Failed cardioversion 2/10. Now on rate control strategy. Continue dig. No b-blocker yet given low BP and need for midodrine If rates increase can use oral amio for rate control.  - was initially off AC due to GIB. Now on Eliquis. No bleeding   4. GIB/Anemia - On eliquis for afib, presentation hgb 6.1, +FOBT; Ferritin 27, iron 13, folate 10.9.  S/p transfused 3  units of PRBCs -> hgb 8.7.  MCV 63. - S/P EGD 2/1 with gastritis. On protonix + carafate.  - Per GI--> not a candidate for colonoscopy at this time. CT A/P without concerning lesions - he is back on a/c, Eliquis 5 mg bid  - Hgb 8.4 stable  5. AKI - Likely due to ATN/cardiorenal - Baseline Cr 1.15, peak Cr 2.59 - Resolved. Creatinine 0.78  6. DM2 - per CCM   7. Hyponatremia -resolved  8. Persistent Hypotension  - Now off NE but SBPs remains in the 80-90s.  - Continue midodrine 15 tid. -> wean as tolerated - Continue  to hold diuretics and BP active meds  - TED hose.   He is stable for d/c from my perspective. PT recommending HHPT/OT. If he remains in the hospital, we will see gain Monday unless called over the weekend.    Length of Stay: 15  Glori Bickers, MD  10:47 AM

## 2020-09-10 ENCOUNTER — Encounter (HOSPITAL_COMMUNITY): Payer: Self-pay | Admitting: Internal Medicine

## 2020-09-10 DIAGNOSIS — J1281 Pneumonia due to SARS-associated coronavirus: Secondary | ICD-10-CM | POA: Diagnosis not present

## 2020-09-10 DIAGNOSIS — I130 Hypertensive heart and chronic kidney disease with heart failure and stage 1 through stage 4 chronic kidney disease, or unspecified chronic kidney disease: Secondary | ICD-10-CM | POA: Diagnosis not present

## 2020-09-10 DIAGNOSIS — D509 Iron deficiency anemia, unspecified: Secondary | ICD-10-CM | POA: Diagnosis not present

## 2020-09-10 DIAGNOSIS — K2101 Gastro-esophageal reflux disease with esophagitis, with bleeding: Secondary | ICD-10-CM | POA: Diagnosis not present

## 2020-09-10 DIAGNOSIS — N189 Chronic kidney disease, unspecified: Secondary | ICD-10-CM | POA: Diagnosis not present

## 2020-09-10 DIAGNOSIS — J449 Chronic obstructive pulmonary disease, unspecified: Secondary | ICD-10-CM | POA: Diagnosis not present

## 2020-09-10 DIAGNOSIS — J1282 Pneumonia due to coronavirus disease 2019: Secondary | ICD-10-CM | POA: Diagnosis not present

## 2020-09-10 DIAGNOSIS — J440 Chronic obstructive pulmonary disease with acute lower respiratory infection: Secondary | ICD-10-CM | POA: Diagnosis not present

## 2020-09-10 DIAGNOSIS — J441 Chronic obstructive pulmonary disease with (acute) exacerbation: Secondary | ICD-10-CM | POA: Diagnosis not present

## 2020-09-10 DIAGNOSIS — I5082 Biventricular heart failure: Secondary | ICD-10-CM | POA: Diagnosis not present

## 2020-09-10 DIAGNOSIS — I5043 Acute on chronic combined systolic (congestive) and diastolic (congestive) heart failure: Secondary | ICD-10-CM | POA: Diagnosis not present

## 2020-09-10 DIAGNOSIS — E1122 Type 2 diabetes mellitus with diabetic chronic kidney disease: Secondary | ICD-10-CM | POA: Diagnosis not present

## 2020-09-10 DIAGNOSIS — U071 COVID-19: Secondary | ICD-10-CM | POA: Diagnosis not present

## 2020-09-10 DIAGNOSIS — J9601 Acute respiratory failure with hypoxia: Secondary | ICD-10-CM | POA: Diagnosis not present

## 2020-09-10 DIAGNOSIS — I2510 Atherosclerotic heart disease of native coronary artery without angina pectoris: Secondary | ICD-10-CM | POA: Diagnosis not present

## 2020-09-10 LAB — CULTURE, BLOOD (ROUTINE X 2)
Culture: NO GROWTH
Culture: NO GROWTH
Special Requests: ADEQUATE
Special Requests: ADEQUATE

## 2020-09-11 ENCOUNTER — Encounter: Payer: Self-pay | Admitting: Gastroenterology

## 2020-09-11 DIAGNOSIS — J441 Chronic obstructive pulmonary disease with (acute) exacerbation: Secondary | ICD-10-CM | POA: Diagnosis not present

## 2020-09-11 DIAGNOSIS — N189 Chronic kidney disease, unspecified: Secondary | ICD-10-CM | POA: Diagnosis not present

## 2020-09-11 DIAGNOSIS — J9601 Acute respiratory failure with hypoxia: Secondary | ICD-10-CM | POA: Diagnosis not present

## 2020-09-11 DIAGNOSIS — E1122 Type 2 diabetes mellitus with diabetic chronic kidney disease: Secondary | ICD-10-CM | POA: Diagnosis not present

## 2020-09-11 DIAGNOSIS — K2101 Gastro-esophageal reflux disease with esophagitis, with bleeding: Secondary | ICD-10-CM | POA: Diagnosis not present

## 2020-09-11 DIAGNOSIS — J440 Chronic obstructive pulmonary disease with acute lower respiratory infection: Secondary | ICD-10-CM | POA: Diagnosis not present

## 2020-09-11 DIAGNOSIS — J1282 Pneumonia due to coronavirus disease 2019: Secondary | ICD-10-CM | POA: Diagnosis not present

## 2020-09-11 DIAGNOSIS — I5082 Biventricular heart failure: Secondary | ICD-10-CM | POA: Diagnosis not present

## 2020-09-11 DIAGNOSIS — D509 Iron deficiency anemia, unspecified: Secondary | ICD-10-CM | POA: Diagnosis not present

## 2020-09-11 DIAGNOSIS — I130 Hypertensive heart and chronic kidney disease with heart failure and stage 1 through stage 4 chronic kidney disease, or unspecified chronic kidney disease: Secondary | ICD-10-CM | POA: Diagnosis not present

## 2020-09-11 DIAGNOSIS — U071 COVID-19: Secondary | ICD-10-CM | POA: Diagnosis not present

## 2020-09-11 DIAGNOSIS — I2510 Atherosclerotic heart disease of native coronary artery without angina pectoris: Secondary | ICD-10-CM | POA: Diagnosis not present

## 2020-09-11 DIAGNOSIS — I5043 Acute on chronic combined systolic (congestive) and diastolic (congestive) heart failure: Secondary | ICD-10-CM | POA: Diagnosis not present

## 2020-09-14 DIAGNOSIS — U071 COVID-19: Secondary | ICD-10-CM | POA: Diagnosis not present

## 2020-09-14 DIAGNOSIS — I5043 Acute on chronic combined systolic (congestive) and diastolic (congestive) heart failure: Secondary | ICD-10-CM | POA: Diagnosis not present

## 2020-09-14 DIAGNOSIS — J440 Chronic obstructive pulmonary disease with acute lower respiratory infection: Secondary | ICD-10-CM | POA: Diagnosis not present

## 2020-09-14 DIAGNOSIS — J1282 Pneumonia due to coronavirus disease 2019: Secondary | ICD-10-CM | POA: Diagnosis not present

## 2020-09-14 DIAGNOSIS — E1122 Type 2 diabetes mellitus with diabetic chronic kidney disease: Secondary | ICD-10-CM | POA: Diagnosis not present

## 2020-09-14 DIAGNOSIS — I130 Hypertensive heart and chronic kidney disease with heart failure and stage 1 through stage 4 chronic kidney disease, or unspecified chronic kidney disease: Secondary | ICD-10-CM | POA: Diagnosis not present

## 2020-09-14 DIAGNOSIS — I5082 Biventricular heart failure: Secondary | ICD-10-CM | POA: Diagnosis not present

## 2020-09-14 DIAGNOSIS — N189 Chronic kidney disease, unspecified: Secondary | ICD-10-CM | POA: Diagnosis not present

## 2020-09-14 DIAGNOSIS — I2510 Atherosclerotic heart disease of native coronary artery without angina pectoris: Secondary | ICD-10-CM | POA: Diagnosis not present

## 2020-09-14 DIAGNOSIS — J441 Chronic obstructive pulmonary disease with (acute) exacerbation: Secondary | ICD-10-CM | POA: Diagnosis not present

## 2020-09-14 DIAGNOSIS — D509 Iron deficiency anemia, unspecified: Secondary | ICD-10-CM | POA: Diagnosis not present

## 2020-09-14 DIAGNOSIS — K2101 Gastro-esophageal reflux disease with esophagitis, with bleeding: Secondary | ICD-10-CM | POA: Diagnosis not present

## 2020-09-14 DIAGNOSIS — J9601 Acute respiratory failure with hypoxia: Secondary | ICD-10-CM | POA: Diagnosis not present

## 2020-09-15 ENCOUNTER — Encounter (HOSPITAL_COMMUNITY): Payer: Medicare Other

## 2020-09-16 DIAGNOSIS — D509 Iron deficiency anemia, unspecified: Secondary | ICD-10-CM | POA: Diagnosis not present

## 2020-09-16 DIAGNOSIS — K2101 Gastro-esophageal reflux disease with esophagitis, with bleeding: Secondary | ICD-10-CM | POA: Diagnosis not present

## 2020-09-16 DIAGNOSIS — N189 Chronic kidney disease, unspecified: Secondary | ICD-10-CM | POA: Diagnosis not present

## 2020-09-16 DIAGNOSIS — J440 Chronic obstructive pulmonary disease with acute lower respiratory infection: Secondary | ICD-10-CM | POA: Diagnosis not present

## 2020-09-16 DIAGNOSIS — E1122 Type 2 diabetes mellitus with diabetic chronic kidney disease: Secondary | ICD-10-CM | POA: Diagnosis not present

## 2020-09-16 DIAGNOSIS — I2510 Atherosclerotic heart disease of native coronary artery without angina pectoris: Secondary | ICD-10-CM | POA: Diagnosis not present

## 2020-09-16 DIAGNOSIS — I130 Hypertensive heart and chronic kidney disease with heart failure and stage 1 through stage 4 chronic kidney disease, or unspecified chronic kidney disease: Secondary | ICD-10-CM | POA: Diagnosis not present

## 2020-09-16 DIAGNOSIS — I5082 Biventricular heart failure: Secondary | ICD-10-CM | POA: Diagnosis not present

## 2020-09-16 DIAGNOSIS — J9601 Acute respiratory failure with hypoxia: Secondary | ICD-10-CM | POA: Diagnosis not present

## 2020-09-16 DIAGNOSIS — J1282 Pneumonia due to coronavirus disease 2019: Secondary | ICD-10-CM | POA: Diagnosis not present

## 2020-09-16 DIAGNOSIS — U071 COVID-19: Secondary | ICD-10-CM | POA: Diagnosis not present

## 2020-09-16 DIAGNOSIS — I5043 Acute on chronic combined systolic (congestive) and diastolic (congestive) heart failure: Secondary | ICD-10-CM | POA: Diagnosis not present

## 2020-09-16 DIAGNOSIS — J441 Chronic obstructive pulmonary disease with (acute) exacerbation: Secondary | ICD-10-CM | POA: Diagnosis not present

## 2020-09-18 DIAGNOSIS — E1122 Type 2 diabetes mellitus with diabetic chronic kidney disease: Secondary | ICD-10-CM | POA: Diagnosis not present

## 2020-09-18 DIAGNOSIS — I5043 Acute on chronic combined systolic (congestive) and diastolic (congestive) heart failure: Secondary | ICD-10-CM | POA: Diagnosis not present

## 2020-09-18 DIAGNOSIS — K2101 Gastro-esophageal reflux disease with esophagitis, with bleeding: Secondary | ICD-10-CM | POA: Diagnosis not present

## 2020-09-18 DIAGNOSIS — I5082 Biventricular heart failure: Secondary | ICD-10-CM | POA: Diagnosis not present

## 2020-09-18 DIAGNOSIS — D509 Iron deficiency anemia, unspecified: Secondary | ICD-10-CM | POA: Diagnosis not present

## 2020-09-18 DIAGNOSIS — U071 COVID-19: Secondary | ICD-10-CM | POA: Diagnosis not present

## 2020-09-18 DIAGNOSIS — J441 Chronic obstructive pulmonary disease with (acute) exacerbation: Secondary | ICD-10-CM | POA: Diagnosis not present

## 2020-09-18 DIAGNOSIS — J440 Chronic obstructive pulmonary disease with acute lower respiratory infection: Secondary | ICD-10-CM | POA: Diagnosis not present

## 2020-09-18 DIAGNOSIS — J9601 Acute respiratory failure with hypoxia: Secondary | ICD-10-CM | POA: Diagnosis not present

## 2020-09-18 DIAGNOSIS — Z20822 Contact with and (suspected) exposure to covid-19: Secondary | ICD-10-CM | POA: Diagnosis not present

## 2020-09-18 DIAGNOSIS — J449 Chronic obstructive pulmonary disease, unspecified: Secondary | ICD-10-CM | POA: Diagnosis not present

## 2020-09-18 DIAGNOSIS — I2510 Atherosclerotic heart disease of native coronary artery without angina pectoris: Secondary | ICD-10-CM | POA: Diagnosis not present

## 2020-09-18 DIAGNOSIS — J1282 Pneumonia due to coronavirus disease 2019: Secondary | ICD-10-CM | POA: Diagnosis not present

## 2020-09-18 DIAGNOSIS — N189 Chronic kidney disease, unspecified: Secondary | ICD-10-CM | POA: Diagnosis not present

## 2020-09-18 DIAGNOSIS — I130 Hypertensive heart and chronic kidney disease with heart failure and stage 1 through stage 4 chronic kidney disease, or unspecified chronic kidney disease: Secondary | ICD-10-CM | POA: Diagnosis not present

## 2020-09-19 DIAGNOSIS — J441 Chronic obstructive pulmonary disease with (acute) exacerbation: Secondary | ICD-10-CM | POA: Diagnosis not present

## 2020-09-19 DIAGNOSIS — J440 Chronic obstructive pulmonary disease with acute lower respiratory infection: Secondary | ICD-10-CM | POA: Diagnosis not present

## 2020-09-19 DIAGNOSIS — N189 Chronic kidney disease, unspecified: Secondary | ICD-10-CM | POA: Diagnosis not present

## 2020-09-19 DIAGNOSIS — D509 Iron deficiency anemia, unspecified: Secondary | ICD-10-CM | POA: Diagnosis not present

## 2020-09-19 DIAGNOSIS — I5043 Acute on chronic combined systolic (congestive) and diastolic (congestive) heart failure: Secondary | ICD-10-CM | POA: Diagnosis not present

## 2020-09-19 DIAGNOSIS — K2101 Gastro-esophageal reflux disease with esophagitis, with bleeding: Secondary | ICD-10-CM | POA: Diagnosis not present

## 2020-09-19 DIAGNOSIS — I130 Hypertensive heart and chronic kidney disease with heart failure and stage 1 through stage 4 chronic kidney disease, or unspecified chronic kidney disease: Secondary | ICD-10-CM | POA: Diagnosis not present

## 2020-09-19 DIAGNOSIS — J1282 Pneumonia due to coronavirus disease 2019: Secondary | ICD-10-CM | POA: Diagnosis not present

## 2020-09-19 DIAGNOSIS — J9601 Acute respiratory failure with hypoxia: Secondary | ICD-10-CM | POA: Diagnosis not present

## 2020-09-19 DIAGNOSIS — I2510 Atherosclerotic heart disease of native coronary artery without angina pectoris: Secondary | ICD-10-CM | POA: Diagnosis not present

## 2020-09-19 DIAGNOSIS — I5082 Biventricular heart failure: Secondary | ICD-10-CM | POA: Diagnosis not present

## 2020-09-19 DIAGNOSIS — U071 COVID-19: Secondary | ICD-10-CM | POA: Diagnosis not present

## 2020-09-19 DIAGNOSIS — E1122 Type 2 diabetes mellitus with diabetic chronic kidney disease: Secondary | ICD-10-CM | POA: Diagnosis not present

## 2020-09-20 DIAGNOSIS — Z466 Encounter for fitting and adjustment of urinary device: Secondary | ICD-10-CM | POA: Diagnosis not present

## 2020-09-21 ENCOUNTER — Encounter (HOSPITAL_COMMUNITY): Payer: Medicare Other

## 2020-09-22 DIAGNOSIS — U071 COVID-19: Secondary | ICD-10-CM | POA: Diagnosis not present

## 2020-09-22 DIAGNOSIS — E1122 Type 2 diabetes mellitus with diabetic chronic kidney disease: Secondary | ICD-10-CM | POA: Diagnosis not present

## 2020-09-22 DIAGNOSIS — I130 Hypertensive heart and chronic kidney disease with heart failure and stage 1 through stage 4 chronic kidney disease, or unspecified chronic kidney disease: Secondary | ICD-10-CM | POA: Diagnosis not present

## 2020-09-22 DIAGNOSIS — K2101 Gastro-esophageal reflux disease with esophagitis, with bleeding: Secondary | ICD-10-CM | POA: Diagnosis not present

## 2020-09-22 DIAGNOSIS — J9601 Acute respiratory failure with hypoxia: Secondary | ICD-10-CM | POA: Diagnosis not present

## 2020-09-22 DIAGNOSIS — I2510 Atherosclerotic heart disease of native coronary artery without angina pectoris: Secondary | ICD-10-CM | POA: Diagnosis not present

## 2020-09-22 DIAGNOSIS — J441 Chronic obstructive pulmonary disease with (acute) exacerbation: Secondary | ICD-10-CM | POA: Diagnosis not present

## 2020-09-22 DIAGNOSIS — J1282 Pneumonia due to coronavirus disease 2019: Secondary | ICD-10-CM | POA: Diagnosis not present

## 2020-09-22 DIAGNOSIS — I5043 Acute on chronic combined systolic (congestive) and diastolic (congestive) heart failure: Secondary | ICD-10-CM | POA: Diagnosis not present

## 2020-09-22 DIAGNOSIS — N189 Chronic kidney disease, unspecified: Secondary | ICD-10-CM | POA: Diagnosis not present

## 2020-09-22 DIAGNOSIS — I5082 Biventricular heart failure: Secondary | ICD-10-CM | POA: Diagnosis not present

## 2020-09-22 DIAGNOSIS — D509 Iron deficiency anemia, unspecified: Secondary | ICD-10-CM | POA: Diagnosis not present

## 2020-09-22 DIAGNOSIS — J440 Chronic obstructive pulmonary disease with acute lower respiratory infection: Secondary | ICD-10-CM | POA: Diagnosis not present

## 2020-09-27 DIAGNOSIS — J1282 Pneumonia due to coronavirus disease 2019: Secondary | ICD-10-CM | POA: Diagnosis not present

## 2020-09-27 DIAGNOSIS — J9601 Acute respiratory failure with hypoxia: Secondary | ICD-10-CM | POA: Diagnosis not present

## 2020-09-27 DIAGNOSIS — N189 Chronic kidney disease, unspecified: Secondary | ICD-10-CM | POA: Diagnosis not present

## 2020-09-27 DIAGNOSIS — I5082 Biventricular heart failure: Secondary | ICD-10-CM | POA: Diagnosis not present

## 2020-09-27 DIAGNOSIS — D509 Iron deficiency anemia, unspecified: Secondary | ICD-10-CM | POA: Diagnosis not present

## 2020-09-27 DIAGNOSIS — I2510 Atherosclerotic heart disease of native coronary artery without angina pectoris: Secondary | ICD-10-CM | POA: Diagnosis not present

## 2020-09-27 DIAGNOSIS — I130 Hypertensive heart and chronic kidney disease with heart failure and stage 1 through stage 4 chronic kidney disease, or unspecified chronic kidney disease: Secondary | ICD-10-CM | POA: Diagnosis not present

## 2020-09-27 DIAGNOSIS — U071 COVID-19: Secondary | ICD-10-CM | POA: Diagnosis not present

## 2020-09-27 DIAGNOSIS — E1122 Type 2 diabetes mellitus with diabetic chronic kidney disease: Secondary | ICD-10-CM | POA: Diagnosis not present

## 2020-09-27 DIAGNOSIS — J441 Chronic obstructive pulmonary disease with (acute) exacerbation: Secondary | ICD-10-CM | POA: Diagnosis not present

## 2020-09-27 DIAGNOSIS — J440 Chronic obstructive pulmonary disease with acute lower respiratory infection: Secondary | ICD-10-CM | POA: Diagnosis not present

## 2020-09-27 DIAGNOSIS — I5043 Acute on chronic combined systolic (congestive) and diastolic (congestive) heart failure: Secondary | ICD-10-CM | POA: Diagnosis not present

## 2020-09-27 DIAGNOSIS — K2101 Gastro-esophageal reflux disease with esophagitis, with bleeding: Secondary | ICD-10-CM | POA: Diagnosis not present

## 2020-09-28 DIAGNOSIS — D509 Iron deficiency anemia, unspecified: Secondary | ICD-10-CM | POA: Diagnosis not present

## 2020-09-28 DIAGNOSIS — U071 COVID-19: Secondary | ICD-10-CM | POA: Diagnosis not present

## 2020-09-28 DIAGNOSIS — J9601 Acute respiratory failure with hypoxia: Secondary | ICD-10-CM | POA: Diagnosis not present

## 2020-09-28 DIAGNOSIS — I130 Hypertensive heart and chronic kidney disease with heart failure and stage 1 through stage 4 chronic kidney disease, or unspecified chronic kidney disease: Secondary | ICD-10-CM | POA: Diagnosis not present

## 2020-09-28 DIAGNOSIS — I5082 Biventricular heart failure: Secondary | ICD-10-CM | POA: Diagnosis not present

## 2020-09-28 DIAGNOSIS — J1282 Pneumonia due to coronavirus disease 2019: Secondary | ICD-10-CM | POA: Diagnosis not present

## 2020-09-28 DIAGNOSIS — N189 Chronic kidney disease, unspecified: Secondary | ICD-10-CM | POA: Diagnosis not present

## 2020-09-28 DIAGNOSIS — I2510 Atherosclerotic heart disease of native coronary artery without angina pectoris: Secondary | ICD-10-CM | POA: Diagnosis not present

## 2020-09-28 DIAGNOSIS — I5043 Acute on chronic combined systolic (congestive) and diastolic (congestive) heart failure: Secondary | ICD-10-CM | POA: Diagnosis not present

## 2020-09-28 DIAGNOSIS — J441 Chronic obstructive pulmonary disease with (acute) exacerbation: Secondary | ICD-10-CM | POA: Diagnosis not present

## 2020-09-28 DIAGNOSIS — J440 Chronic obstructive pulmonary disease with acute lower respiratory infection: Secondary | ICD-10-CM | POA: Diagnosis not present

## 2020-09-28 DIAGNOSIS — E1122 Type 2 diabetes mellitus with diabetic chronic kidney disease: Secondary | ICD-10-CM | POA: Diagnosis not present

## 2020-09-28 DIAGNOSIS — K2101 Gastro-esophageal reflux disease with esophagitis, with bleeding: Secondary | ICD-10-CM | POA: Diagnosis not present

## 2020-10-03 NOTE — Progress Notes (Signed)
Advanced Heart Failure Clinic Note  PCP: Dr. Sherrie Sport HF Cardiologist: Dr. Haroldine Laws  HPI: Perry Hunter a 70 y.o. M with a past medical history of T2DM, GERD, COPD, afib on Eliquis, biventricular systolic heart failure.  He presented 08/25/20 to [facility] for edema & SOB. Hgb 6.1, FOBT positive. EKG with afib w/ ST depression. Echo EF 20-25%, grade 3DD, severe RH dysfunction, RVSP 44.4, IVC dilated. CXR opacity RML and RLL with cardiomegaly. Also COVID+. He was started on pressors. He received 3 units of PRBCs and IV lasix. He was transferred to [facility] CVICU for cardiogenic shock. EGD showed gastritis.  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 Elevated filling pressures with preserved CO. TEE/DCCV (09/07/20) unsuccessful. Discharge weight 220 lbs.  Today he returns for HF post-hospital follow up with his daughter. Now on 2L oxygen prn. Overall feeling fine, SOB with stairs and having LE edema. Denies increasing SOB, CP, dizziness, or PND/Orthopnea. Appetite ok. No fever or chills. Weight at home ~223 pounds. Taking all medications. BP at home ~110/70. Non-smoker and no ETOH use.   Cardiac Studies: Echo (2/22): EF 20-25%, grade III DD, global hypokinesis, RVSF severely reduced, RVSP 44, small pericardial effusion. R/LHC (2/22):  Prox RCA lesion is 99% stenosed.  Mid LAD to Dist LAD lesion is 40% stenosed.  Prox LAD to Mid LAD lesion is 30% stenosed.  Mid Cx lesion is 100% stenosed.  Dist Cx lesion is 90% stenosed.   Findings:  Ao = 102/65 (80)  LV =  93/25 RA = 14 RV = 52/15 PA = 53/19 (35) PCW = 28 Fick cardiac output/index = 6.0/2.8 PVR = 1.1 WU Ao sat = 93% PA sat = 51%, 54%  Assessment: 1. 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 Elevated filling pressures with preserved CO  ROS: All systems negative except as  listed in HPI, PMH and Problem List.  SH:  Social History   Socioeconomic History  . Marital status: Married    Spouse name: Not on file  . Number of children: Not on file  . Years of education: Not on file  . Highest education level: Not on file  Occupational History  . Not on file  Tobacco Use  . Smoking status: Current Every Day Smoker    Types: Cigarettes  . Smokeless tobacco: Never Used  Substance and Sexual Activity  . Alcohol use: Not Currently  . Drug use: Never  . Sexual activity: Not on file  Other Topics Concern  . Not on file  Social History Narrative  . Not on file   Social Determinants of Health   Financial Resource Strain: Not on file  Food Insecurity: Not on file  Transportation Needs: Not on file  Physical Activity: Not on file  Stress: Not on file  Social Connections: Not on file  Intimate Partner Violence: Not on file   FH: No family history on file.  Past Medical History:  Diagnosis Date  . Arthritis   . Asthma   . CHF (congestive heart failure) ([place])   . Diabetes mellitus without complication ([place])   . GERD (gastroesophageal reflux disease)   . Hypercholesteremia   . Hypertension     Current Outpatient Medications  Medication Sig Dispense Refill  . acetaminophen (TYLENOL) 650 MG CR tablet Take 1,300 mg by mouth daily.    . Cholecalciferol (VITAMIN D3) 1000  units CAPS Take 1 capsule by mouth daily.    . digoxin 62.5 MCG TABS Take 0.0625 mg by mouth daily. 30 tablet 1  . ELIQUIS 5 MG TABS tablet Take 5 mg by mouth 2 (two) times daily.    . ferrous sulfate 325 (65 FE) MG tablet Take 1 tablet (325 mg total) by mouth daily. 30 tablet 1  . fluticasone furoate-vilanterol (BREO ELLIPTA) 200-25 MCG/INH AEPB Inhale 1 puff into the lungs daily. 60 each 0  . folic acid (FOLVITE) 1 MG tablet Take 1 tablet (1 mg total) by mouth daily. 30 tablet 1  . furosemide (LASIX) 40 MG tablet Take 1 tablet (40 mg total) by mouth daily. 30 tablet 1  .  HYDROcodone-acetaminophen (NORCO/VICODIN) 5-325 MG tablet Take 1 tablet by mouth 3 (three) times daily as needed for pain.    Marland Kitchen insulin detemir (LEVEMIR) 100 UNIT/ML injection Inject 40 Units into the skin at bedtime.    . metFORMIN (GLUCOPHAGE) 500 MG tablet Take 1,000 mg by mouth daily with breakfast.    . midodrine (PROAMATINE) 5 MG tablet Take 3 tablets (15 mg total) by mouth 3 (three) times daily with meals. 270 tablet 1  . Omega-3 Fatty Acids (FISH OIL) 1000 MG CAPS Take 1 capsule by mouth daily.    . pantoprazole (PROTONIX) 40 MG tablet Take 1 tablet (40 mg total) by mouth 2 (two) times daily. 60 tablet 1  . potassium chloride SA (KLOR-CON) 20 MEQ tablet Take 2 tablets (40 mEq total) by mouth daily. 30 tablet 1  . PROAIR HFA 108 (90 Base) MCG/ACT inhaler Inhale 2 puffs into the lungs 4 (four) times daily as needed for shortness of breath.    . rosuvastatin (CRESTOR) 20 MG tablet Take 1 tablet (20 mg total) by mouth daily. 30 tablet 1  . sitaGLIPtin (JANUVIA) 100 MG tablet Take 100 mg by mouth daily.    . sucralfate (CARAFATE) 1 GM/10ML suspension Take 10 mLs (1 g total) by mouth 4 (four) times daily for 16 days. 640 mL 0  . tamsulosin (FLOMAX) 0.4 MG CAPS capsule Take 0.8 mg by mouth every evening.    . thiamine 100 MG tablet Take 1 tablet (100 mg total) by mouth daily. 30 tablet 1  . umeclidinium bromide (INCRUSE ELLIPTA) 62.5 MCG/INH AEPB Inhale 1 puff into the lungs daily. 30 each 1   No current facility-administered medications for this encounter.    Vitals:   10/04/20 1120  BP: 96/74  Pulse: 77  SpO2: (!) 83%  Weight: 102.2 kg (225 lb 6.4 oz)   Wt Readings from Last 3 Encounters:  10/04/20 102.2 kg (225 lb 6.4 oz)  09/09/20 100.1 kg (220 lb 10.9 oz)   PHYSICAL EXAM: General:  NAD. No resp difficulty, on oxygen, arrived in wheelchair. HEENT: Normal Neck: Supple. Thick neck, JVP difficult to assess. Carotids 2+ bilat; no bruits. No lymphadenopathy or thryomegaly  appreciated. Cor: PMI nondisplaced. Irregular rate & rhythm. No rubs, gallops or murmurs. Lungs: Clear, markedly diminished in bases Abdomen: Obese, nontender, + distended. No hepatosplenomegaly. No bruits or masses. Good bowel sounds. +foley Extremities: No cyanosis, clubbing, rash, 2+ LE edema, bilateral TED hose in place. Neuro: Alert & oriented x 3, cranial nerves grossly intact. Moves all 4 extremities w/o difficulty. Affect pleasant.  ECG: atrial fibrillation 91 bpm (personally reviewed).  ASSESSMENT & PLAN: 1. Biventricular systolic HF - Echo 0000000: EF 20-25%, global hypokinesis, GIIIDD, severely RV HK  - Suspect may be related to AF/tachy-mediated, w/  contribution from CAD and possibly ETOH.  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 Elevated filling pressures with preserved CO (on milrinone).  - NYHA III, confounded by body habitus and physical deconditioning. Volume status up, weight up. - Increase lasix 40 mg bid x 3 days+ 40 mEq of KCl daily; then back to lasix 40 mg daily. - Continue midodrine 15 mg tid. Will not decrease today with BP of 96/74. Consider decreasing at next visit if BP allows. - Continue digoxin 0.0625 mg daily. - No ARB/Spiro/b-blocker until off midodrine.  - BMET & dig level today; repeat BMET 10 days. - Educated on importance of salt and fluid restriction, especially in setting of  - BMET & digoxin level today  2. 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  - Had Remdesivr. Completed 7 days of doxycycline.   - Oxygen sats 83% on room air, left oxygen tank in carr. Put on 2L oxygen in clinic. - Needs to wear oxygen, keep sats >90%.  3. Persistent AF  - Has h/o AF. Unclear if this is chronic, given that he was on amio at home suspect not chronic.  - Failed cardioversion 09/07/20. Now on rate control strategy.  -  Continue digoxin. - No b-blocker yet w/ need for midodrine. (If rates increase, can use oral amio for rate control.) - Continue Eliquis. No bleeding.  4. 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 - Continue asa + statin - no CP.  5. GIB/Anemia - S/P EGD 2/1 with gastritis. On protonix + carafate.  - On Eliquis 5 mg bid. No bleeding issues. - CBC today.  6. DM2 - On metformin, Levemir and Januvia. - per PCP.  7. Persistent Hypotension  - Continue midodrine 15 tid. -> wean as tolerated - TED hose.   8. Daytime fatigue - Nods off easily during the day, has daytime fatigue. - Will order sleep study.  9. Obesity - Body mass index is 34.27 kg/m. - Encouraged low carb food choices and increased physical activity as able.  Follow up in 3 weeks in APP clinic. Consider decreasing midodrine if able.  Allena Katz, FNP-BC 10/04/20

## 2020-10-04 ENCOUNTER — Ambulatory Visit (HOSPITAL_COMMUNITY)
Admission: RE | Admit: 2020-10-04 | Discharge: 2020-10-04 | Disposition: A | Discharge status: Home / Self Care | Payer: Medicare Other | Source: Ambulatory Visit | Attending: Family Medicine | Admitting: Family Medicine

## 2020-10-04 ENCOUNTER — Encounter (HOSPITAL_COMMUNITY): Payer: Self-pay

## 2020-10-04 ENCOUNTER — Other Ambulatory Visit: Payer: Self-pay

## 2020-10-04 VITALS — BP 96/74 | HR 77 | Wt 225.4 lb

## 2020-10-04 DIAGNOSIS — F17210 Nicotine dependence, cigarettes, uncomplicated: Secondary | ICD-10-CM | POA: Insufficient documentation

## 2020-10-04 DIAGNOSIS — Z79899 Other long term (current) drug therapy: Secondary | ICD-10-CM | POA: Diagnosis not present

## 2020-10-04 DIAGNOSIS — Z713 Dietary counseling and surveillance: Secondary | ICD-10-CM | POA: Diagnosis not present

## 2020-10-04 DIAGNOSIS — Z7984 Long term (current) use of oral hypoglycemic drugs: Secondary | ICD-10-CM | POA: Insufficient documentation

## 2020-10-04 DIAGNOSIS — Z6834 Body mass index (BMI) 34.0-34.9, adult: Secondary | ICD-10-CM | POA: Insufficient documentation

## 2020-10-04 DIAGNOSIS — Z7901 Long term (current) use of anticoagulants: Secondary | ICD-10-CM | POA: Insufficient documentation

## 2020-10-04 DIAGNOSIS — I2510 Atherosclerotic heart disease of native coronary artery without angina pectoris: Secondary | ICD-10-CM | POA: Insufficient documentation

## 2020-10-04 DIAGNOSIS — I959 Hypotension, unspecified: Secondary | ICD-10-CM | POA: Diagnosis not present

## 2020-10-04 DIAGNOSIS — D649 Anemia, unspecified: Secondary | ICD-10-CM | POA: Diagnosis not present

## 2020-10-04 DIAGNOSIS — Z794 Long term (current) use of insulin: Secondary | ICD-10-CM | POA: Diagnosis not present

## 2020-10-04 DIAGNOSIS — E1169 Type 2 diabetes mellitus with other specified complication: Secondary | ICD-10-CM | POA: Diagnosis not present

## 2020-10-04 DIAGNOSIS — U099 Post covid-19 condition, unspecified: Secondary | ICD-10-CM | POA: Diagnosis not present

## 2020-10-04 DIAGNOSIS — I5082 Biventricular heart failure: Secondary | ICD-10-CM | POA: Insufficient documentation

## 2020-10-04 DIAGNOSIS — I110 Hypertensive heart disease with heart failure: Secondary | ICD-10-CM | POA: Diagnosis not present

## 2020-10-04 DIAGNOSIS — I4819 Other persistent atrial fibrillation: Secondary | ICD-10-CM | POA: Insufficient documentation

## 2020-10-04 DIAGNOSIS — Z7182 Exercise counseling: Secondary | ICD-10-CM | POA: Diagnosis not present

## 2020-10-04 DIAGNOSIS — J9611 Chronic respiratory failure with hypoxia: Secondary | ICD-10-CM | POA: Diagnosis not present

## 2020-10-04 DIAGNOSIS — Z7951 Long term (current) use of inhaled steroids: Secondary | ICD-10-CM | POA: Insufficient documentation

## 2020-10-04 DIAGNOSIS — I9589 Other hypotension: Secondary | ICD-10-CM | POA: Insufficient documentation

## 2020-10-04 DIAGNOSIS — E119 Type 2 diabetes mellitus without complications: Secondary | ICD-10-CM | POA: Insufficient documentation

## 2020-10-04 DIAGNOSIS — J449 Chronic obstructive pulmonary disease, unspecified: Secondary | ICD-10-CM | POA: Diagnosis not present

## 2020-10-04 DIAGNOSIS — R400 Somnolence: Secondary | ICD-10-CM | POA: Insufficient documentation

## 2020-10-04 DIAGNOSIS — Z8719 Personal history of other diseases of the digestive system: Secondary | ICD-10-CM

## 2020-10-04 DIAGNOSIS — I5022 Chronic systolic (congestive) heart failure: Secondary | ICD-10-CM | POA: Diagnosis not present

## 2020-10-04 DIAGNOSIS — E669 Obesity, unspecified: Secondary | ICD-10-CM | POA: Diagnosis not present

## 2020-10-04 DIAGNOSIS — K219 Gastro-esophageal reflux disease without esophagitis: Secondary | ICD-10-CM | POA: Insufficient documentation

## 2020-10-04 LAB — CBC
HCT: 33.6 % — ABNORMAL LOW (ref 39.0–52.0)
Hemoglobin: 9.8 g/dL — ABNORMAL LOW (ref 13.0–17.0)
MCH: 19.4 pg — ABNORMAL LOW (ref 26.0–34.0)
MCHC: 29.2 g/dL — ABNORMAL LOW (ref 30.0–36.0)
MCV: 66.5 fL — ABNORMAL LOW (ref 80.0–100.0)
Platelets: 466 10*3/uL — ABNORMAL HIGH (ref 150–400)
RBC: 5.05 MIL/uL (ref 4.22–5.81)
RDW: 22.3 % — ABNORMAL HIGH (ref 11.5–15.5)
WBC: 8.4 10*3/uL (ref 4.0–10.5)
nRBC: 0 % (ref 0.0–0.2)

## 2020-10-04 LAB — BASIC METABOLIC PANEL
Anion gap: 13 (ref 5–15)
BUN: 18 mg/dL (ref 8–23)
CO2: 29 mmol/L (ref 22–32)
Calcium: 8.3 mg/dL — ABNORMAL LOW (ref 8.9–10.3)
Chloride: 91 mmol/L — ABNORMAL LOW (ref 98–111)
Creatinine, Ser: 1.24 mg/dL (ref 0.61–1.24)
GFR, Estimated: >60 mL/min (ref >60)
Glucose, Bld: 105 mg/dL — ABNORMAL HIGH (ref 70–99)
Potassium: 4.1 mmol/L (ref 3.5–5.1)
Sodium: 133 mmol/L — ABNORMAL LOW (ref 135–145)

## 2020-10-04 LAB — DIGOXIN LEVEL: Digoxin Level: 0.5 ng/mL — ABNORMAL LOW (ref 0.8–2.0)

## 2020-10-04 NOTE — Progress Notes (Signed)
Patient Name: Perry Hunter        DOB:  [birth year]      Height: 5'8    Weight: 225 lb  Office Name: [facility]          Referring Provider: NP Allena Katz  Today's Date: 10/04/2020  Date:   STOP BANG RISK ASSESSMENT S (snore) Have you been told that you snore?     NO   T (tired) Are you often tired, fatigued, or sleepy during the day?   YES  O (obstruction) Do you stop breathing, choke, or gasp during sleep? NO   P (pressure) Do you have or are you being treated for high blood pressure? YES   B (BMI) Is your body index greater than 35 kg/m? NO   A (age) Are you 72 years old or older? YES   N (neck) Do you have a neck circumference greater than 16 inches?   NA   G (gender) Are you a male? YES   TOTAL STOP/BANG "YES" ANSWERS 4                                                                       For Office Use Only              Procedure Order Form    YES to 3+ Stop Bang questions OR two clinical symptoms - patient qualifies for WatchPAT (CPT 95800)

## 2020-10-04 NOTE — Patient Instructions (Addendum)
INCREASE Lasixc to '40mg'$  (1 tab) twice a day for 3 days THEN go back to regular dose of '40mg'$  (1 tab) daily  Labs today and repeat in 10 days We will only contact you if something comes back abnormal or we need to make some changes. Otherwise no news is good news!  Your provider has recommended that you have a home sleep study.  We have provided you with the equipment in our office today. Please download the app and follow the instructions. YOUR PIN NUMBER IS: [id number]. Once you have completed the test you just dispose of the equipment, the information is automatically uploaded to [place] via blue-tooth technology. If your test is positive for sleep apnea and you need a home CPAP machine you will be contacted by Dr Theodosia Blender office [facility]) to set this up.   Your physician recommends that you schedule a follow-up appointment in: 3-4 weeks with the Nurse Practitioner  Please call office at [phone] option 2 if you have any questions or concerns.   At the [facility], you and your health needs are our priority. As part of our continuing mission to provide you with exceptional heart care, we have created designated Provider Care Teams. These Care Teams include your primary Cardiologist (physician) and Advanced Practice Providers (APPs- Physician Assistants and Nurse Practitioners) who all work together to provide you with the care you need, when you need it.   You may see any of the following providers on your designated Care Team at your next follow up: Marland Kitchen Dr Glori Bickers . Dr Loralie Champagne . Dr Vickki Muff . Darrick Grinder, NP . Lyda Jester, [place] . Audry Riles, PharmD   Please be sure to bring in all your medications bottles to every appointment.

## 2020-10-05 ENCOUNTER — Telehealth (HOSPITAL_COMMUNITY): Payer: Self-pay | Admitting: Surgery

## 2020-10-05 DIAGNOSIS — J9601 Acute respiratory failure with hypoxia: Secondary | ICD-10-CM | POA: Diagnosis not present

## 2020-10-05 DIAGNOSIS — I2510 Atherosclerotic heart disease of native coronary artery without angina pectoris: Secondary | ICD-10-CM | POA: Diagnosis not present

## 2020-10-05 DIAGNOSIS — K2101 Gastro-esophageal reflux disease with esophagitis, with bleeding: Secondary | ICD-10-CM | POA: Diagnosis not present

## 2020-10-05 DIAGNOSIS — N189 Chronic kidney disease, unspecified: Secondary | ICD-10-CM | POA: Diagnosis not present

## 2020-10-05 DIAGNOSIS — J440 Chronic obstructive pulmonary disease with acute lower respiratory infection: Secondary | ICD-10-CM | POA: Diagnosis not present

## 2020-10-05 DIAGNOSIS — E1122 Type 2 diabetes mellitus with diabetic chronic kidney disease: Secondary | ICD-10-CM | POA: Diagnosis not present

## 2020-10-05 DIAGNOSIS — I5043 Acute on chronic combined systolic (congestive) and diastolic (congestive) heart failure: Secondary | ICD-10-CM | POA: Diagnosis not present

## 2020-10-05 DIAGNOSIS — J1282 Pneumonia due to coronavirus disease 2019: Secondary | ICD-10-CM | POA: Diagnosis not present

## 2020-10-05 DIAGNOSIS — I130 Hypertensive heart and chronic kidney disease with heart failure and stage 1 through stage 4 chronic kidney disease, or unspecified chronic kidney disease: Secondary | ICD-10-CM | POA: Diagnosis not present

## 2020-10-05 DIAGNOSIS — U071 COVID-19: Secondary | ICD-10-CM | POA: Diagnosis not present

## 2020-10-05 DIAGNOSIS — D509 Iron deficiency anemia, unspecified: Secondary | ICD-10-CM | POA: Diagnosis not present

## 2020-10-05 DIAGNOSIS — I5082 Biventricular heart failure: Secondary | ICD-10-CM | POA: Diagnosis not present

## 2020-10-05 DIAGNOSIS — J441 Chronic obstructive pulmonary disease with (acute) exacerbation: Secondary | ICD-10-CM | POA: Diagnosis not present

## 2020-10-05 NOTE — Telephone Encounter (Signed)
I called patient and reached his granddaughter.  I let her know that it is fine to proceed with home sleep study as ordered and that insurance authorization is not required.

## 2020-10-06 ENCOUNTER — Encounter (INDEPENDENT_AMBULATORY_CARE_PROVIDER_SITE_OTHER): Payer: Medicare Other | Admitting: Cardiology

## 2020-10-06 DIAGNOSIS — G4733 Obstructive sleep apnea (adult) (pediatric): Secondary | ICD-10-CM | POA: Diagnosis not present

## 2020-10-08 ENCOUNTER — Ambulatory Visit: Payer: Medicare Other

## 2020-10-08 DIAGNOSIS — J449 Chronic obstructive pulmonary disease, unspecified: Secondary | ICD-10-CM | POA: Diagnosis not present

## 2020-10-08 DIAGNOSIS — R400 Somnolence: Secondary | ICD-10-CM

## 2020-10-08 NOTE — Procedures (Signed)
   Sleep Study Report  Patient Information Name: Perry Hunter  ID: [id number] Birth Date: 01-Jun-1951  Age: 70  Gender:Male Study Date:10/06/2020 Referring Physician:  Glori Bickers, MD  TEST DESCRIPTION: Home sleep apnea testing was completed using the WatchPat, a Type 1 device, utilizing  peripheral arterial tonometry (PAT), chest movement, actigraphy, pulse oximetry, pulse rate, body position and snore.  AHI was calculated with apnea and hypopnea using valid sleep time as the denominator. RDI includes apneas,  hypopneas, and RERAs. The data acquired and the scoring of sleep and all associated events were performed in  accordance with the recommended standards and specifications as outlined in the AASM Manual for the Scoring of  Sleep and Associated Events 2.2.0 (2015)  FINDINGS:  1. Moderate Obstructive Sleep Apnea with AHI 24.1/hr.  2. No Central Sleep Apnea with pAHIc 1.7/hr. 3. Oxygen desaturations as low as 57%. 4. Mild to moderate snoring was present. O2 sats were < 88% for 182.1 min. 5. Total sleep time was 7 hrs and 32 min. 6. 18.5% of total sleep time was spent in REM sleep.  7. Shortened sleep onset latency at 6 min.  8. Shortened REM sleep onset latency at 63 min.  9. Total awakenings were 22.   DIAGNOSIS:  Moderate Obstructive Sleep Apnea (G47.33) Nocturnal Hypoxemia  RECOMMENDATIONS: 1. Clinical correlation of these findings is necessary. The decision to treat obstructive sleep apnea (OSA) is usually  based on the presence of apnea symptoms or the presence of associated medical conditions such as Hypertension,  Congestive Heart Failure, Atrial Fibrillation or Obesity. The most common symptoms of OSA are snoring, gasping for  breath while sleeping, daytime sleepiness and fatigue.   2. Initiating apnea therapy is recommended given the presence of symptoms and/or associated conditions.  Recommend proceeding with one of the following:   a. Auto-CPAP therapy  with a pressure range of 5-20cm H2O.   b. An oral appliance (OA) that can be obtained from certain dentists with expertise in sleep medicine. These are  primarily of use in non-obese patients with mild and moderate disease.   c. An ENT consultation which may be useful to look for specific causes of obstruction and possible treatment  Options.   d. If patient is intolerant to PAP therapy, consider referral to ENT for evaluation for hypoglossal nerve stimulator.  3. Close follow-up is necessary to ensure success with CPAP or oral appliance therapy for maximum benefit .  4. A follow-up oximetry study on CPAP is recommended to assess the adequacy of therapy and determine the need  for supplemental oxygen or the potential need for Bi-level therapy. An arterial blood gas to determine the adequacy of  baseline ventilation and oxygenation should also be considered.  5. Healthy sleep recommendations include: adequate nightly sleep (normal 7-9 hrs/night), avoidance of caffeine after  noon and alcohol near bedtime, and maintaining a sleep environment that is cool, dark and quiet.  6. Weight loss for overweight patients is recommended. Even modest amounts of weight loss can significantly  improve the severity of sleep apnea.  7. Snoring recommendations include: weight loss where appropriate, side sleeping, and avoidance of alcohol before  Bed.  8. Operation of motor vehicle or dangerous equipment must be avoided when feeling drowsy, excessively sleepy, or  mentally fatigued.  Report prepared by: Signature: Fransico Him, MD [facility], [place] Board of Sleep Medicine  Electronically Signed: Oct 08, 2020

## 2020-10-10 ENCOUNTER — Telehealth (HOSPITAL_COMMUNITY): Payer: Self-pay | Admitting: *Deleted

## 2020-10-10 DIAGNOSIS — M47812 Spondylosis without myelopathy or radiculopathy, cervical region: Secondary | ICD-10-CM | POA: Diagnosis not present

## 2020-10-10 DIAGNOSIS — M4802 Spinal stenosis, cervical region: Secondary | ICD-10-CM | POA: Diagnosis not present

## 2020-10-10 DIAGNOSIS — I5022 Chronic systolic (congestive) heart failure: Secondary | ICD-10-CM

## 2020-10-10 DIAGNOSIS — M5030 Other cervical disc degeneration, unspecified cervical region: Secondary | ICD-10-CM | POA: Diagnosis not present

## 2020-10-10 DIAGNOSIS — Z79899 Other long term (current) drug therapy: Secondary | ICD-10-CM | POA: Diagnosis not present

## 2020-10-10 MED ORDER — FUROSEMIDE 40 MG PO TABS: 40.0000 mg | ORAL_TABLET | Freq: Two times a day (BID) | ORAL | 1 refills | Status: DC

## 2020-10-10 NOTE — Telephone Encounter (Signed)
Pts granddaughter called clinic social worker Eliezer Lofts to report pt has swelling in feet and ankles but weight is only up 2lbs since office visit 10/07/20. Pts granddaughter thinks he should continue lasix 40bid and not 40 daily to keep fluid down.  Routed to [employer] for advice

## 2020-10-10 NOTE — Telephone Encounter (Signed)
Continue Lasix 40 mg bid and repeat BMP on Monday.

## 2020-10-10 NOTE — Telephone Encounter (Signed)
Pts granddaughter aware and verbalized understanding. Pt has appt with urology and will have labs drawn there and faxed to our office.

## 2020-10-11 DIAGNOSIS — E1122 Type 2 diabetes mellitus with diabetic chronic kidney disease: Secondary | ICD-10-CM | POA: Diagnosis not present

## 2020-10-11 DIAGNOSIS — J1282 Pneumonia due to coronavirus disease 2019: Secondary | ICD-10-CM | POA: Diagnosis not present

## 2020-10-11 DIAGNOSIS — U071 COVID-19: Secondary | ICD-10-CM | POA: Diagnosis not present

## 2020-10-11 DIAGNOSIS — I2510 Atherosclerotic heart disease of native coronary artery without angina pectoris: Secondary | ICD-10-CM | POA: Diagnosis not present

## 2020-10-11 DIAGNOSIS — N189 Chronic kidney disease, unspecified: Secondary | ICD-10-CM | POA: Diagnosis not present

## 2020-10-11 DIAGNOSIS — J9601 Acute respiratory failure with hypoxia: Secondary | ICD-10-CM | POA: Diagnosis not present

## 2020-10-11 DIAGNOSIS — J440 Chronic obstructive pulmonary disease with acute lower respiratory infection: Secondary | ICD-10-CM | POA: Diagnosis not present

## 2020-10-11 DIAGNOSIS — K2101 Gastro-esophageal reflux disease with esophagitis, with bleeding: Secondary | ICD-10-CM | POA: Diagnosis not present

## 2020-10-11 DIAGNOSIS — J441 Chronic obstructive pulmonary disease with (acute) exacerbation: Secondary | ICD-10-CM | POA: Diagnosis not present

## 2020-10-11 DIAGNOSIS — D509 Iron deficiency anemia, unspecified: Secondary | ICD-10-CM | POA: Diagnosis not present

## 2020-10-11 DIAGNOSIS — I130 Hypertensive heart and chronic kidney disease with heart failure and stage 1 through stage 4 chronic kidney disease, or unspecified chronic kidney disease: Secondary | ICD-10-CM | POA: Diagnosis not present

## 2020-10-11 DIAGNOSIS — I5043 Acute on chronic combined systolic (congestive) and diastolic (congestive) heart failure: Secondary | ICD-10-CM | POA: Diagnosis not present

## 2020-10-11 DIAGNOSIS — I5082 Biventricular heart failure: Secondary | ICD-10-CM | POA: Diagnosis not present

## 2020-10-12 DIAGNOSIS — U071 COVID-19: Secondary | ICD-10-CM | POA: Diagnosis not present

## 2020-10-12 DIAGNOSIS — I5043 Acute on chronic combined systolic (congestive) and diastolic (congestive) heart failure: Secondary | ICD-10-CM | POA: Diagnosis not present

## 2020-10-12 DIAGNOSIS — J440 Chronic obstructive pulmonary disease with acute lower respiratory infection: Secondary | ICD-10-CM | POA: Diagnosis not present

## 2020-10-12 DIAGNOSIS — J9601 Acute respiratory failure with hypoxia: Secondary | ICD-10-CM | POA: Diagnosis not present

## 2020-10-12 DIAGNOSIS — I130 Hypertensive heart and chronic kidney disease with heart failure and stage 1 through stage 4 chronic kidney disease, or unspecified chronic kidney disease: Secondary | ICD-10-CM | POA: Diagnosis not present

## 2020-10-12 DIAGNOSIS — I2510 Atherosclerotic heart disease of native coronary artery without angina pectoris: Secondary | ICD-10-CM | POA: Diagnosis not present

## 2020-10-12 DIAGNOSIS — I5082 Biventricular heart failure: Secondary | ICD-10-CM | POA: Diagnosis not present

## 2020-10-12 DIAGNOSIS — D509 Iron deficiency anemia, unspecified: Secondary | ICD-10-CM | POA: Diagnosis not present

## 2020-10-12 DIAGNOSIS — K2101 Gastro-esophageal reflux disease with esophagitis, with bleeding: Secondary | ICD-10-CM | POA: Diagnosis not present

## 2020-10-12 DIAGNOSIS — J441 Chronic obstructive pulmonary disease with (acute) exacerbation: Secondary | ICD-10-CM | POA: Diagnosis not present

## 2020-10-12 DIAGNOSIS — E1122 Type 2 diabetes mellitus with diabetic chronic kidney disease: Secondary | ICD-10-CM | POA: Diagnosis not present

## 2020-10-12 DIAGNOSIS — N189 Chronic kidney disease, unspecified: Secondary | ICD-10-CM | POA: Diagnosis not present

## 2020-10-12 DIAGNOSIS — J1282 Pneumonia due to coronavirus disease 2019: Secondary | ICD-10-CM | POA: Diagnosis not present

## 2020-10-16 ENCOUNTER — Telehealth: Payer: Self-pay | Admitting: *Deleted

## 2020-10-16 ENCOUNTER — Other Ambulatory Visit: Payer: Self-pay

## 2020-10-16 ENCOUNTER — Encounter: Payer: Self-pay | Admitting: Urology

## 2020-10-16 ENCOUNTER — Ambulatory Visit (INDEPENDENT_AMBULATORY_CARE_PROVIDER_SITE_OTHER): Payer: Medicare Other | Admitting: Urology

## 2020-10-16 VITALS — BP 93/59 | HR 71 | Temp 98.2°F | Wt 215.0 lb

## 2020-10-16 DIAGNOSIS — R339 Retention of urine, unspecified: Secondary | ICD-10-CM

## 2020-10-16 DIAGNOSIS — N138 Other obstructive and reflux uropathy: Secondary | ICD-10-CM

## 2020-10-16 DIAGNOSIS — N401 Enlarged prostate with lower urinary tract symptoms: Secondary | ICD-10-CM | POA: Diagnosis not present

## 2020-10-16 DIAGNOSIS — R3912 Poor urinary stream: Secondary | ICD-10-CM

## 2020-10-16 DIAGNOSIS — G4733 Obstructive sleep apnea (adult) (pediatric): Secondary | ICD-10-CM

## 2020-10-16 MED ORDER — FINASTERIDE 5 MG PO TABS: 5.0000 mg | ORAL_TABLET | Freq: Every day | ORAL | 3 refills | Status: AC

## 2020-10-16 MED ORDER — CIPROFLOXACIN HCL 500 MG PO TABS
500.0000 mg | ORAL_TABLET | Freq: Once | ORAL | Status: AC
  Administered 2020-10-16: 500 mg via ORAL

## 2020-10-16 NOTE — Progress Notes (Signed)
Urological Symptom Review  Patient is experiencing the following symptoms: Frequent urination Stream starts and stops   Review of Systems  Gastrointestinal (upper)  : Nausea Indigestion/heartburn  Gastrointestinal (lower) : Negative for lower GI symptoms  Constitutional : Negative for symptoms  Skin: Negative for skin symptoms  Eyes: Negative for eye symptoms  Ear/Nose/Throat : Negative for Ear/Nose/Throat symptoms  Hematologic/Lymphatic: Easy bruising  Cardiovascular : Leg swelling  Respiratory : Negative for respiratory symptoms  Endocrine: Negative for endocrine symptoms  Musculoskeletal: Negative for musculoskeletal symptoms  Neurological: Negative for neurological symptoms  Psychologic: Negative for psychiatric symptoms

## 2020-10-16 NOTE — Patient Instructions (Signed)

## 2020-10-16 NOTE — Telephone Encounter (Signed)
-----   Message from Sueanne Margarita, MD sent at 10/08/2020  8:24 PM EDT ----- Please let patient know that they have sleep apnea and recommend CPAP titration ASAP due to severity of OSA and hypoxemia. Please set up titration in the sleep lab.

## 2020-10-16 NOTE — Telephone Encounter (Signed)
Staff message sent to Gae Bon ok to schedule CPAP titration. Per [employer] no PA is required. Decision [id number].

## 2020-10-16 NOTE — Telephone Encounter (Addendum)
Informed patient of sleep study results and patient understanding was verbalized. Patient understands his sleep study showed they have sleep apnea and recommend CPAP titration ASAP due to severity of OSA and hypoxemia. Please set up titration in the sleep lab.   Titration sent to sleep pool PER [facility]: Left detailed message on voicemail and informed patient to call back with questions.

## 2020-10-16 NOTE — Progress Notes (Signed)
Fill and Pull Catheter Removal  Patient is present today for a catheter removal.  Patient was cleaned and prepped in a sterile fashion 162m of sterile water/ saline was instilled into the bladder when the patient felt the urge to urinate. 122mof water was then drained from the balloon.  A 16FR foley cath was removed from the bladder no complications were noted .  Patient as then given some time to void on their own.  Patient can void  20028mn their own after some time.  Patient tolerated well.  Performed by: Lanore Renderos, lpn  Follow up/ Additional notes: Keep scheduled [place] for pvr

## 2020-10-16 NOTE — Progress Notes (Signed)
10/16/2020 11:32 AM   Perry Hunter [birth year] [id number]  Referring provider: Neale Burly, MD [address] [place],   [phone]  No chief complaint on file.   HPI:  New pt for -   1) BPH, urinary retention - pt with CHF, obesity admitted to hospital for diuresis 02/22. He went into urinary retention and foley placed. He underwent CT scan 02/22 which showed benign GU tract, no hydro, foley, and prostate ~45 g. He is on tamsulosin. Foley is draining well. Urine clear. He is ambulating much better now and stronger. No constipation. No prior GU hx.   Filled with 130 ml and voided 200 ml.   PMH: Past Medical History:  Diagnosis Date  . Arthritis   . Asthma   . CHF (congestive heart failure) ([place])   . Diabetes mellitus without complication ([place])   . GERD (gastroesophageal reflux disease)   . Hypercholesteremia   . Hypertension     Surgical History: Past Surgical History:  Procedure Laterality Date  . BIOPSY  08/29/2020   Procedure: BIOPSY;  Surgeon: Rush Landmark Telford Nab., MD;  Location: [place];  Service: Gastroenterology;;  . CARDIOVERSION N/A 09/07/2020   Procedure: CARDIOVERSION;  Surgeon: Jolaine Artist, MD;  Location: [facility] ENDOSCOPY;  Service: Cardiovascular;  Laterality: N/A;  . ESOPHAGOGASTRODUODENOSCOPY (EGD) WITH PROPOFOL N/A 08/29/2020   Procedure: ESOPHAGOGASTRODUODENOSCOPY (EGD) WITH PROPOFOL;  Surgeon: Irving Copas., MD;  Location: [place];  Service: Gastroenterology;  Laterality: N/A;  . RIGHT/LEFT HEART CATH AND CORONARY ANGIOGRAPHY N/A 08/31/2020   Procedure: RIGHT/LEFT HEART CATH AND CORONARY ANGIOGRAPHY;  Surgeon: Jolaine Artist, MD;  Location: [place] CV LAB;  Service: Cardiovascular;  Laterality: N/A;  . TEE WITHOUT CARDIOVERSION N/A 09/07/2020   Procedure: TRANSESOPHAGEAL ECHOCARDIOGRAM (TEE);  Surgeon: Jolaine Artist, MD;  Location[facility] ENDOSCOPY;  Service: Cardiovascular;  Laterality: N/A;    Home  Medications:  Allergies as of 10/16/2020   No Known Allergies     Medication List       Accurate as of October 16, 2020 11:32 AM. If you have any questions, ask your nurse or doctor.        acetaminophen 650 MG CR tablet Commonly known as: TYLENOL Take 1,300 mg by mouth daily.   Digoxin 62.5 MCG Tabs Take 0.0625 mg by mouth daily.   Eliquis 5 MG Tabs tablet Generic drug: apixaban Take 5 mg by mouth 2 (two) times daily.   ferrous sulfate 325 (65 FE) MG tablet Take 1 tablet (325 mg total) by mouth daily.   Fish Oil 1000 MG Caps Take 1 capsule by mouth daily.   fluticasone furoate-vilanterol 200-25 MCG/INH Aepb Commonly known as: BREO ELLIPTA Inhale 1 puff into the lungs daily.   folic acid 1 MG tablet Commonly known as: FOLVITE Take 1 tablet (1 mg total) by mouth daily.   furosemide 40 MG tablet Commonly known as: LASIX Take 1 tablet (40 mg total) by mouth 2 (two) times daily.   HYDROcodone-acetaminophen 5-325 MG tablet Commonly known as: NORCO/VICODIN Take 1 tablet by mouth 3 (three) times daily as needed for pain.   insulin detemir 100 UNIT/ML injection Commonly known as: LEVEMIR Inject 40 Units into the skin at bedtime.   metFORMIN 500 MG tablet Commonly known as: GLUCOPHAGE Take 1,000 mg by mouth daily with breakfast.   midodrine 5 MG tablet Commonly known as: PROAMATINE Take 3 tablets (15 mg total) by mouth 3 (three) times daily with meals.   pantoprazole 40 MG tablet  Commonly known as: PROTONIX Take 1 tablet (40 mg total) by mouth 2 (two) times daily.   potassium chloride SA 20 MEQ tablet Commonly known as: KLOR-CON Take 2 tablets (40 mEq total) by mouth daily.   ProAir HFA 108 (90 Base) MCG/ACT inhaler Generic drug: albuterol Inhale 2 puffs into the lungs 4 (four) times daily as needed for shortness of breath.   rosuvastatin 20 MG tablet Commonly known as: CRESTOR Take 1 tablet (20 mg total) by mouth daily.   sitaGLIPtin 100 MG  tablet Commonly known as: JANUVIA Take 100 mg by mouth daily.   sucralfate 1 GM/10ML suspension Commonly known as: CARAFATE Take 10 mLs (1 g total) by mouth 4 (four) times daily for 16 days.   tamsulosin 0.4 MG Caps capsule Commonly known as: FLOMAX Take 0.8 mg by mouth every evening.   thiamine 100 MG tablet Take 1 tablet (100 mg total) by mouth daily.   umeclidinium bromide 62.5 MCG/INH Aepb Commonly known as: INCRUSE ELLIPTA Inhale 1 puff into the lungs daily.   Vitamin D3 25 MCG (1000 UT) Caps Take 1 capsule by mouth daily.       Allergies: No Known Allergies  Family History: No family history on file.  Social History:  reports that he has been smoking cigarettes. He has never used smokeless tobacco. He reports previous alcohol use. He reports that he does not use drugs.   Physical Exam: BP (!) 93/59   Pulse 71   Temp 98.2 F (36.8 C)   Wt 215 lb (97.5 kg)   BMI 32.69 kg/m   Constitutional:  Alert and oriented, No acute distress. HEENT: [place] AT, moist mucus membranes.  Trachea midline, no masses. Cardiovascular: No clubbing, cyanosis, or edema. Respiratory: Normal respiratory effort, no increased work of breathing. GI: Abdomen is soft, nontender, nondistended, no abdominal masses GU: No CVA tenderness Skin: No rashes, bruises or suspicious lesions. Neurologic: Grossly intact, no focal deficits, moving all 4 extremities. Psychiatric: Normal mood and affect. GU: Foley in place - urine clear   Laboratory Data: Lab Results  Component Value Date   WBC 8.4 10/04/2020   HGB 9.8 (L) 10/04/2020   HCT 33.6 (L) 10/04/2020   MCV 66.5 (L) 10/04/2020   PLT 466 (H) 10/04/2020    Lab Results  Component Value Date   CREATININE 1.24 10/04/2020    No results found for: PSA  No results found for: TESTOSTERONE  No results found for: HGBA1C  Urinalysis    Component Value Date/Time   COLORURINE YELLOW 08/30/2020 [place] 08/30/2020 1445    LABSPEC 1.038 (H) 08/30/2020 1445   PHURINE 6.0 08/30/2020 1445   GLUCOSEU NEGATIVE 08/30/2020 1445   HGBUR SMALL (A) 08/30/2020 1445   BILIRUBINUR NEGATIVE 08/30/2020 1445   KETONESUR NEGATIVE 08/30/2020 1445   PROTEINUR NEGATIVE 08/30/2020 1445   NITRITE NEGATIVE 08/30/2020 1445   LEUKOCYTESUR NEGATIVE 08/30/2020 1445    Lab Results  Component Value Date   BACTERIA RARE (A) 08/30/2020    Pertinent Imaging: CT 02/22  No results found for this or any previous visit.  No results found for this or any previous visit.  No results found for this or any previous visit.  No results found for this or any previous visit.  No results found for this or any previous visit.  No results found for this or any previous visit.  No results found for this or any previous visit.  No results found for this or any previous  visit.   Assessment & Plan:    Urinary retention - void trial today successful.   BPH, Weak st - on tamsulosin. We went over the nature r/b/a of alpha blockers and addition of 5ari (inc FDA warnings). Discussed rationale for addition of 5ari. I sent in finasteride.   Check PVR in 1 week and see me in 3 mo. With PVR.   No follow-ups on file.  Festus Aloe, MD  [facility] [address] [place], [place] [address] [phone]

## 2020-10-17 DIAGNOSIS — U071 COVID-19: Secondary | ICD-10-CM | POA: Diagnosis not present

## 2020-10-17 DIAGNOSIS — I130 Hypertensive heart and chronic kidney disease with heart failure and stage 1 through stage 4 chronic kidney disease, or unspecified chronic kidney disease: Secondary | ICD-10-CM | POA: Diagnosis not present

## 2020-10-17 DIAGNOSIS — J441 Chronic obstructive pulmonary disease with (acute) exacerbation: Secondary | ICD-10-CM | POA: Diagnosis not present

## 2020-10-17 DIAGNOSIS — I5043 Acute on chronic combined systolic (congestive) and diastolic (congestive) heart failure: Secondary | ICD-10-CM | POA: Diagnosis not present

## 2020-10-17 DIAGNOSIS — J1282 Pneumonia due to coronavirus disease 2019: Secondary | ICD-10-CM | POA: Diagnosis not present

## 2020-10-17 DIAGNOSIS — N189 Chronic kidney disease, unspecified: Secondary | ICD-10-CM | POA: Diagnosis not present

## 2020-10-17 DIAGNOSIS — I5082 Biventricular heart failure: Secondary | ICD-10-CM | POA: Diagnosis not present

## 2020-10-17 DIAGNOSIS — J440 Chronic obstructive pulmonary disease with acute lower respiratory infection: Secondary | ICD-10-CM | POA: Diagnosis not present

## 2020-10-17 DIAGNOSIS — K2101 Gastro-esophageal reflux disease with esophagitis, with bleeding: Secondary | ICD-10-CM | POA: Diagnosis not present

## 2020-10-17 DIAGNOSIS — J9601 Acute respiratory failure with hypoxia: Secondary | ICD-10-CM | POA: Diagnosis not present

## 2020-10-17 DIAGNOSIS — E1122 Type 2 diabetes mellitus with diabetic chronic kidney disease: Secondary | ICD-10-CM | POA: Diagnosis not present

## 2020-10-17 DIAGNOSIS — I2510 Atherosclerotic heart disease of native coronary artery without angina pectoris: Secondary | ICD-10-CM | POA: Diagnosis not present

## 2020-10-17 DIAGNOSIS — D509 Iron deficiency anemia, unspecified: Secondary | ICD-10-CM | POA: Diagnosis not present

## 2020-10-17 NOTE — Telephone Encounter (Signed)
Lauralee Evener, CMA  Freada Bergeron, CMA Ok to schedule CPAP titration. Per [facility] web portal no PA is required. Decision [id number].      From: Freada Bergeron, CMA  Sent: 10/16/2020 11:57 AM EDT  To: Cv Div Sleep Studies  Subject: precert                      recommend CPAP titration ASAP

## 2020-10-18 ENCOUNTER — Telehealth: Payer: Self-pay | Admitting: *Deleted

## 2020-10-18 DIAGNOSIS — J441 Chronic obstructive pulmonary disease with (acute) exacerbation: Secondary | ICD-10-CM | POA: Diagnosis not present

## 2020-10-18 DIAGNOSIS — I5043 Acute on chronic combined systolic (congestive) and diastolic (congestive) heart failure: Secondary | ICD-10-CM | POA: Diagnosis not present

## 2020-10-18 DIAGNOSIS — N189 Chronic kidney disease, unspecified: Secondary | ICD-10-CM | POA: Diagnosis not present

## 2020-10-18 DIAGNOSIS — D509 Iron deficiency anemia, unspecified: Secondary | ICD-10-CM | POA: Diagnosis not present

## 2020-10-18 DIAGNOSIS — J440 Chronic obstructive pulmonary disease with acute lower respiratory infection: Secondary | ICD-10-CM | POA: Diagnosis not present

## 2020-10-18 DIAGNOSIS — I5082 Biventricular heart failure: Secondary | ICD-10-CM | POA: Diagnosis not present

## 2020-10-18 DIAGNOSIS — J1282 Pneumonia due to coronavirus disease 2019: Secondary | ICD-10-CM | POA: Diagnosis not present

## 2020-10-18 DIAGNOSIS — I2510 Atherosclerotic heart disease of native coronary artery without angina pectoris: Secondary | ICD-10-CM | POA: Diagnosis not present

## 2020-10-18 DIAGNOSIS — K2101 Gastro-esophageal reflux disease with esophagitis, with bleeding: Secondary | ICD-10-CM | POA: Diagnosis not present

## 2020-10-18 DIAGNOSIS — E1122 Type 2 diabetes mellitus with diabetic chronic kidney disease: Secondary | ICD-10-CM | POA: Diagnosis not present

## 2020-10-18 DIAGNOSIS — I130 Hypertensive heart and chronic kidney disease with heart failure and stage 1 through stage 4 chronic kidney disease, or unspecified chronic kidney disease: Secondary | ICD-10-CM | POA: Diagnosis not present

## 2020-10-18 DIAGNOSIS — U071 COVID-19: Secondary | ICD-10-CM | POA: Diagnosis not present

## 2020-10-18 DIAGNOSIS — J9601 Acute respiratory failure with hypoxia: Secondary | ICD-10-CM | POA: Diagnosis not present

## 2020-10-18 NOTE — Telephone Encounter (Signed)
Patient/Perry Hunter is scheduled for CPAP Titration on 11/02/20 Patient/Perry Hunter understands his titration study will be done at [facility] sleep lab. Patient understands he will receive a letter in a week or so detailing appointment, date, time, and location. Patient understands to call if he does not receive the letter  in a timely manner. Patient/Perry Hunter agrees with treatment and thanked me for call.

## 2020-10-18 NOTE — Addendum Note (Signed)
Addended by: Freada Bergeron on: 10/18/2020 01:28 PM   Modules accepted: Orders

## 2020-10-18 NOTE — Telephone Encounter (Signed)
-----   Message from Lauralee Evener, [place] sent at 10/16/2020 12:30 PM EDT ----- Regarding: RE: precert Ok to schedule CPAP titration. Per [facility] web portal no PA is required. Decision [id number]. ----- Message ----- From: Freada Bergeron, CMA Sent: 10/16/2020  11:57 AM EDT To: Cv Div Sleep Studies Subject: precert                                        recommend CPAP titration ASAP

## 2020-11-01 NOTE — Progress Notes (Incomplete)
Advanced Heart Failure Clinic Note  PCP: Dr. Sherrie Sport HF Cardiologist: Dr. Haroldine Laws  HPI: [name] a 70 y.o. M with a past medical history of T2DM, GERD, COPD, afib on Eliquis, biventricular systolic heart failure.  He presented 08/25/20 to [facility] for edema & SOB. Hgb 6.1, FOBT positive. EKG with afib w/ ST depression. Echo EF 20-25%, grade 3DD, severe RH dysfunction, RVSP 44.4, IVC dilated. CXR opacity RML and RLL with cardiomegaly. Also COVID+. He was started on pressors. He received 3 units of PRBCs and IV lasix. He was transferred to [facility] CVICU for cardiogenic shock. EGD showed gastritis.  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 Elevated filling pressures with preserved CO. TEE/DCCV (09/07/20) unsuccessful. Discharge weight 220 lbs.  Today he returns for HF follow up.Overall feeling fine. Denies SOB/PND/Orthopnea. Appetite ok. No fever or chills. Weight at home  pounds. Taking all medications.  Cardiac Studies: Echo (2/22): EF 20-25%, grade III DD, global hypokinesis, RVSF severely reduced, RVSP 44, small pericardial effusion. R/LHC (2/22):  Prox RCA lesion is 99% stenosed.  Mid LAD to Dist LAD lesion is 40% stenosed.  Prox LAD to Mid LAD lesion is 30% stenosed.  Mid Cx lesion is 100% stenosed.  Dist Cx lesion is 90% stenosed.   Findings:  Ao = 102/65 (80)  LV =  93/25 RA = 14 RV = 52/15 PA = 53/19 (35) PCW = 28 Fick cardiac output/index = 6.0/2.8 PVR = 1.1 WU Ao sat = 93% PA sat = 51%, 54%  Assessment: 1. 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 Elevated filling pressures with preserved CO  ROS: All systems negative except as listed in HPI, PMH and Problem List.  SH:  Social History   Socioeconomic History  . Marital status: Married    Spouse name: Not on file  . Number of children: Not on  file  . Years of education: Not on file  . Highest education level: Not on file  Occupational History  . Not on file  Tobacco Use  . Smoking status: Current Every Day Smoker    Types: Cigarettes  . Smokeless tobacco: Never Used  Substance and Sexual Activity  . Alcohol use: Not Currently  . Drug use: Never  . Sexual activity: Not on file  Other Topics Concern  . Not on file  Social History Narrative  . Not on file   Social Determinants of Health   Financial Resource Strain: Not on file  Food Insecurity: Not on file  Transportation Needs: Not on file  Physical Activity: Not on file  Stress: Not on file  Social Connections: Not on file  Intimate Partner Violence: Not on file   FH: No family history on file.  Past Medical History:  Diagnosis Date  . Arthritis   . Asthma   . CHF (congestive heart failure) ([place])   . Diabetes mellitus without complication ([place])   . GERD (gastroesophageal reflux disease)   . Hypercholesteremia   . Hypertension     Current Outpatient Medications  Medication Sig Dispense Refill  . acetaminophen (TYLENOL) 650 MG CR tablet Take 1,300 mg by mouth daily.    . Cholecalciferol (VITAMIN D3) 1000 units CAPS Take 1 capsule by mouth daily.    . digoxin 62.5 MCG TABS Take 0.0625 mg by mouth daily. 30 tablet 1  . ELIQUIS 5 MG TABS tablet  Take 5 mg by mouth 2 (two) times daily.    . ferrous sulfate 325 (65 FE) MG tablet Take 1 tablet (325 mg total) by mouth daily. 30 tablet 1  . finasteride (PROSCAR) 5 MG tablet Take 1 tablet (5 mg total) by mouth daily. 90 tablet 3  . fluticasone furoate-vilanterol (BREO ELLIPTA) 200-25 MCG/INH AEPB Inhale 1 puff into the lungs daily. (Patient not taking: Reported on 10/16/2020) 60 each 0  . folic acid (FOLVITE) 1 MG tablet Take 1 tablet (1 mg total) by mouth daily. 30 tablet 1  . furosemide (LASIX) 40 MG tablet Take 1 tablet (40 mg total) by mouth 2 (two) times daily. 60 tablet 1  . HYDROcodone-acetaminophen  (NORCO/VICODIN) 5-325 MG tablet Take 1 tablet by mouth 3 (three) times daily as needed for pain.    Marland Kitchen insulin detemir (LEVEMIR) 100 UNIT/ML injection Inject 40 Units into the skin at bedtime.    . metFORMIN (GLUCOPHAGE) 500 MG tablet Take 1,000 mg by mouth daily with breakfast.    . midodrine (PROAMATINE) 5 MG tablet Take 3 tablets (15 mg total) by mouth 3 (three) times daily with meals. 270 tablet 1  . Omega-3 Fatty Acids (FISH OIL) 1000 MG CAPS Take 1 capsule by mouth daily.    . pantoprazole (PROTONIX) 40 MG tablet Take 1 tablet (40 mg total) by mouth 2 (two) times daily. 60 tablet 1  . potassium chloride SA (KLOR-CON) 20 MEQ tablet Take 2 tablets (40 mEq total) by mouth daily. 30 tablet 1  . PROAIR HFA 108 (90 Base) MCG/ACT inhaler Inhale 2 puffs into the lungs 4 (four) times daily as needed for shortness of breath.    . rosuvastatin (CRESTOR) 20 MG tablet Take 1 tablet (20 mg total) by mouth daily. 30 tablet 1  . sitaGLIPtin (JANUVIA) 100 MG tablet Take 100 mg by mouth daily.    . sucralfate (CARAFATE) 1 GM/10ML suspension Take 10 mLs (1 g total) by mouth 4 (four) times daily for 16 days. 640 mL 0  . tamsulosin (FLOMAX) 0.4 MG CAPS capsule Take 0.8 mg by mouth every evening.    . thiamine 100 MG tablet Take 1 tablet (100 mg total) by mouth daily. 30 tablet 1  . umeclidinium bromide (INCRUSE ELLIPTA) 62.5 MCG/INH AEPB Inhale 1 puff into the lungs daily. 30 each 1   No current facility-administered medications for this visit.    There were no vitals filed for this visit. Wt Readings from Last 3 Encounters:  10/16/20 97.5 kg (215 lb)  10/04/20 102.2 kg (225 lb 6.4 oz)  09/09/20 100.1 kg (220 lb 10.9 oz)   PHYSICAL EXAM: General:  Well appearing. No resp difficulty HEENT: normal Neck: supple. no JVD. Carotids 2+ bilat; no bruits. No lymphadenopathy or thryomegaly appreciated. Cor: PMI nondisplaced. Regular rate & rhythm. No rubs, gallops or murmurs. Lungs: clear Abdomen: soft,  nontender, nondistended. No hepatosplenomegaly. No bruits or masses. Good bowel sounds. Extremities: no cyanosis, clubbing, rash, edema Neuro: alert & orientedx3, cranial nerves grossly intact. moves all 4 extremities w/o difficulty. Affect pleasant   ASSESSMENT & PLAN: 1. Biventricular systolic HF - Echo 0000000: EF 20-25%, global hypokinesis, GIIIDD, severely RV HK  - Suspect may be related to AF/tachy-mediated, w/ contribution from CAD and possibly ETOH.  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 Elevated filling pressures with preserved CO (on milrinone).  - NYHA III  - Continue midodrine 15 mg tid. -  Continue digoxin 0.0625 mg daily. - No ARB/Spiro/b-blocker until off midodrine.   2. 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  - Had Remdesivr. Completed 7 days of doxycycline.   - Needs to wear oxygen, keep sats >90%.  3. Persistent AF  - Has h/o AF. Unclear if this is chronic, given that he was on amio at home suspect not chronic.  - Failed cardioversion 09/07/20. Now on rate control strategy.  - Continue digoxin. - No b-blocker yet w/ need for midodrine. (If rates increase, can use oral amio for rate control.) - Continue Eliquis. No bleeding.  4. 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 - Continue asa + statin - no CP.  5. GIB/Anemia - S/P EGD 2/1 with gastritis. On protonix + carafate.  - On Eliquis 5 mg bid. No bleeding issues. - CBC today.  6. DM2 - On metformin, Levemir and Januvia. - per PCP.  7. Persistent Hypotension  - Continue midodrine 15 tid. -> wean as tolerated - TED hose.   8. Daytime fatigue - Nods off easily during the day, has daytime fatigue. - Will order sleep study.  9. Obesity - There is no height or weight on file to calculate  BMI. - Encouraged low carb food choices and increased physical activity as able.    Allena Katz, FNP-BC 11/01/20

## 2020-11-02 ENCOUNTER — Encounter (HOSPITAL_COMMUNITY): Payer: Medicare Other

## 2020-11-02 ENCOUNTER — Encounter: Payer: Medicare Other | Admitting: Cardiology

## 2020-11-06 DIAGNOSIS — Z0000 Encounter for general adult medical examination without abnormal findings: Secondary | ICD-10-CM | POA: Diagnosis not present

## 2020-11-06 DIAGNOSIS — I5032 Chronic diastolic (congestive) heart failure: Secondary | ICD-10-CM | POA: Diagnosis not present

## 2020-11-06 DIAGNOSIS — E1121 Type 2 diabetes mellitus with diabetic nephropathy: Secondary | ICD-10-CM | POA: Diagnosis not present

## 2020-11-06 DIAGNOSIS — Z7689 Persons encountering health services in other specified circumstances: Secondary | ICD-10-CM | POA: Diagnosis not present

## 2020-11-06 DIAGNOSIS — N182 Chronic kidney disease, stage 2 (mild): Secondary | ICD-10-CM | POA: Diagnosis not present

## 2020-11-06 DIAGNOSIS — J449 Chronic obstructive pulmonary disease, unspecified: Secondary | ICD-10-CM | POA: Diagnosis not present

## 2020-11-08 DIAGNOSIS — J449 Chronic obstructive pulmonary disease, unspecified: Secondary | ICD-10-CM | POA: Diagnosis not present

## 2020-11-14 NOTE — Progress Notes (Signed)
Not familiar with this patient. Eyes he has an appointment with Dr. Rush Landmark next month

## 2020-11-17 ENCOUNTER — Ambulatory Visit: Payer: Medicare Other

## 2020-11-24 LAB — ECHO TEE: Area-P 1/2: 2.63 cm2

## 2020-11-27 ENCOUNTER — Ambulatory Visit: Payer: Medicare Other

## 2020-11-27 DIAGNOSIS — N138 Other obstructive and reflux uropathy: Secondary | ICD-10-CM

## 2020-11-29 ENCOUNTER — Ambulatory Visit: Payer: Medicare Other | Admitting: Gastroenterology

## 2020-11-30 ENCOUNTER — Encounter: Payer: Medicare Other | Admitting: Cardiology

## 2020-12-04 ENCOUNTER — Encounter (HOSPITAL_COMMUNITY): Payer: Medicare Other

## 2020-12-08 DIAGNOSIS — J449 Chronic obstructive pulmonary disease, unspecified: Secondary | ICD-10-CM | POA: Diagnosis not present

## 2020-12-12 DIAGNOSIS — M5030 Other cervical disc degeneration, unspecified cervical region: Secondary | ICD-10-CM | POA: Diagnosis not present

## 2020-12-12 DIAGNOSIS — M4802 Spinal stenosis, cervical region: Secondary | ICD-10-CM | POA: Diagnosis not present

## 2020-12-12 DIAGNOSIS — M47812 Spondylosis without myelopathy or radiculopathy, cervical region: Secondary | ICD-10-CM | POA: Diagnosis not present

## 2020-12-12 DIAGNOSIS — Z79899 Other long term (current) drug therapy: Secondary | ICD-10-CM | POA: Diagnosis not present

## 2021-01-05 ENCOUNTER — Other Ambulatory Visit (HOSPITAL_COMMUNITY): Payer: Self-pay | Admitting: Cardiology

## 2021-01-08 DIAGNOSIS — J449 Chronic obstructive pulmonary disease, unspecified: Secondary | ICD-10-CM | POA: Diagnosis not present

## 2021-01-15 ENCOUNTER — Ambulatory Visit: Payer: Medicare Other | Admitting: Urology

## 2021-02-07 DIAGNOSIS — E1121 Type 2 diabetes mellitus with diabetic nephropathy: Secondary | ICD-10-CM | POA: Diagnosis not present

## 2021-02-07 DIAGNOSIS — N182 Chronic kidney disease, stage 2 (mild): Secondary | ICD-10-CM | POA: Diagnosis not present

## 2021-02-07 DIAGNOSIS — J449 Chronic obstructive pulmonary disease, unspecified: Secondary | ICD-10-CM | POA: Diagnosis not present

## 2021-02-07 DIAGNOSIS — I5032 Chronic diastolic (congestive) heart failure: Secondary | ICD-10-CM | POA: Diagnosis not present

## 2021-02-07 DIAGNOSIS — Z0000 Encounter for general adult medical examination without abnormal findings: Secondary | ICD-10-CM | POA: Diagnosis not present

## 2021-03-10 DIAGNOSIS — J449 Chronic obstructive pulmonary disease, unspecified: Secondary | ICD-10-CM | POA: Diagnosis not present

## 2021-03-28 DIAGNOSIS — N182 Chronic kidney disease, stage 2 (mild): Secondary | ICD-10-CM | POA: Diagnosis not present

## 2021-03-28 DIAGNOSIS — E1121 Type 2 diabetes mellitus with diabetic nephropathy: Secondary | ICD-10-CM | POA: Diagnosis not present

## 2021-03-28 DIAGNOSIS — J449 Chronic obstructive pulmonary disease, unspecified: Secondary | ICD-10-CM | POA: Diagnosis not present

## 2021-03-28 DIAGNOSIS — I700 Atherosclerosis of aorta: Secondary | ICD-10-CM | POA: Diagnosis not present

## 2021-03-28 DIAGNOSIS — I5032 Chronic diastolic (congestive) heart failure: Secondary | ICD-10-CM | POA: Diagnosis not present

## 2021-04-05 DIAGNOSIS — M5030 Other cervical disc degeneration, unspecified cervical region: Secondary | ICD-10-CM | POA: Diagnosis not present

## 2021-04-05 DIAGNOSIS — Z79899 Other long term (current) drug therapy: Secondary | ICD-10-CM | POA: Diagnosis not present

## 2021-04-05 DIAGNOSIS — M4802 Spinal stenosis, cervical region: Secondary | ICD-10-CM | POA: Diagnosis not present

## 2021-04-05 DIAGNOSIS — M47812 Spondylosis without myelopathy or radiculopathy, cervical region: Secondary | ICD-10-CM | POA: Diagnosis not present

## 2021-04-10 DIAGNOSIS — J449 Chronic obstructive pulmonary disease, unspecified: Secondary | ICD-10-CM | POA: Diagnosis not present

## 2021-05-10 DIAGNOSIS — J449 Chronic obstructive pulmonary disease, unspecified: Secondary | ICD-10-CM | POA: Diagnosis not present

## 2021-05-30 DIAGNOSIS — N182 Chronic kidney disease, stage 2 (mild): Secondary | ICD-10-CM | POA: Diagnosis not present

## 2021-05-30 DIAGNOSIS — J449 Chronic obstructive pulmonary disease, unspecified: Secondary | ICD-10-CM | POA: Diagnosis not present

## 2021-05-30 DIAGNOSIS — I5032 Chronic diastolic (congestive) heart failure: Secondary | ICD-10-CM | POA: Diagnosis not present

## 2021-05-30 DIAGNOSIS — E782 Mixed hyperlipidemia: Secondary | ICD-10-CM | POA: Diagnosis not present

## 2021-05-30 DIAGNOSIS — I480 Paroxysmal atrial fibrillation: Secondary | ICD-10-CM | POA: Diagnosis not present

## 2021-05-30 DIAGNOSIS — L570 Actinic keratosis: Secondary | ICD-10-CM | POA: Diagnosis not present

## 2021-05-30 DIAGNOSIS — E1121 Type 2 diabetes mellitus with diabetic nephropathy: Secondary | ICD-10-CM | POA: Diagnosis not present

## 2021-05-30 DIAGNOSIS — I700 Atherosclerosis of aorta: Secondary | ICD-10-CM | POA: Diagnosis not present

## 2021-06-10 DIAGNOSIS — J449 Chronic obstructive pulmonary disease, unspecified: Secondary | ICD-10-CM | POA: Diagnosis not present

## 2021-06-19 ENCOUNTER — Telehealth: Payer: Self-pay

## 2021-06-19 NOTE — Telephone Encounter (Signed)
Letter has been sent to patient informing them that their sleep study has expired. Patient will need to call and schedule an office visit to re-evaluate the need for a sleep study.    

## 2021-07-05 DIAGNOSIS — Z79899 Other long term (current) drug therapy: Secondary | ICD-10-CM | POA: Diagnosis not present

## 2021-07-05 DIAGNOSIS — M5030 Other cervical disc degeneration, unspecified cervical region: Secondary | ICD-10-CM | POA: Diagnosis not present

## 2021-07-05 DIAGNOSIS — M4802 Spinal stenosis, cervical region: Secondary | ICD-10-CM | POA: Diagnosis not present

## 2021-07-05 DIAGNOSIS — M47812 Spondylosis without myelopathy or radiculopathy, cervical region: Secondary | ICD-10-CM | POA: Diagnosis not present

## 2021-07-10 DIAGNOSIS — J449 Chronic obstructive pulmonary disease, unspecified: Secondary | ICD-10-CM | POA: Diagnosis not present

## 2021-07-23 IMAGING — DX DG CHEST 2V
2 series · 2 of 2 positions shown · non-contrast
Comparison: None.

CLINICAL DATA: Bilateral leg swelling and abdominal swelling for 3
months. History of asthma and CHF.

EXAM:
CHEST - 2 VIEW

[chest pa]
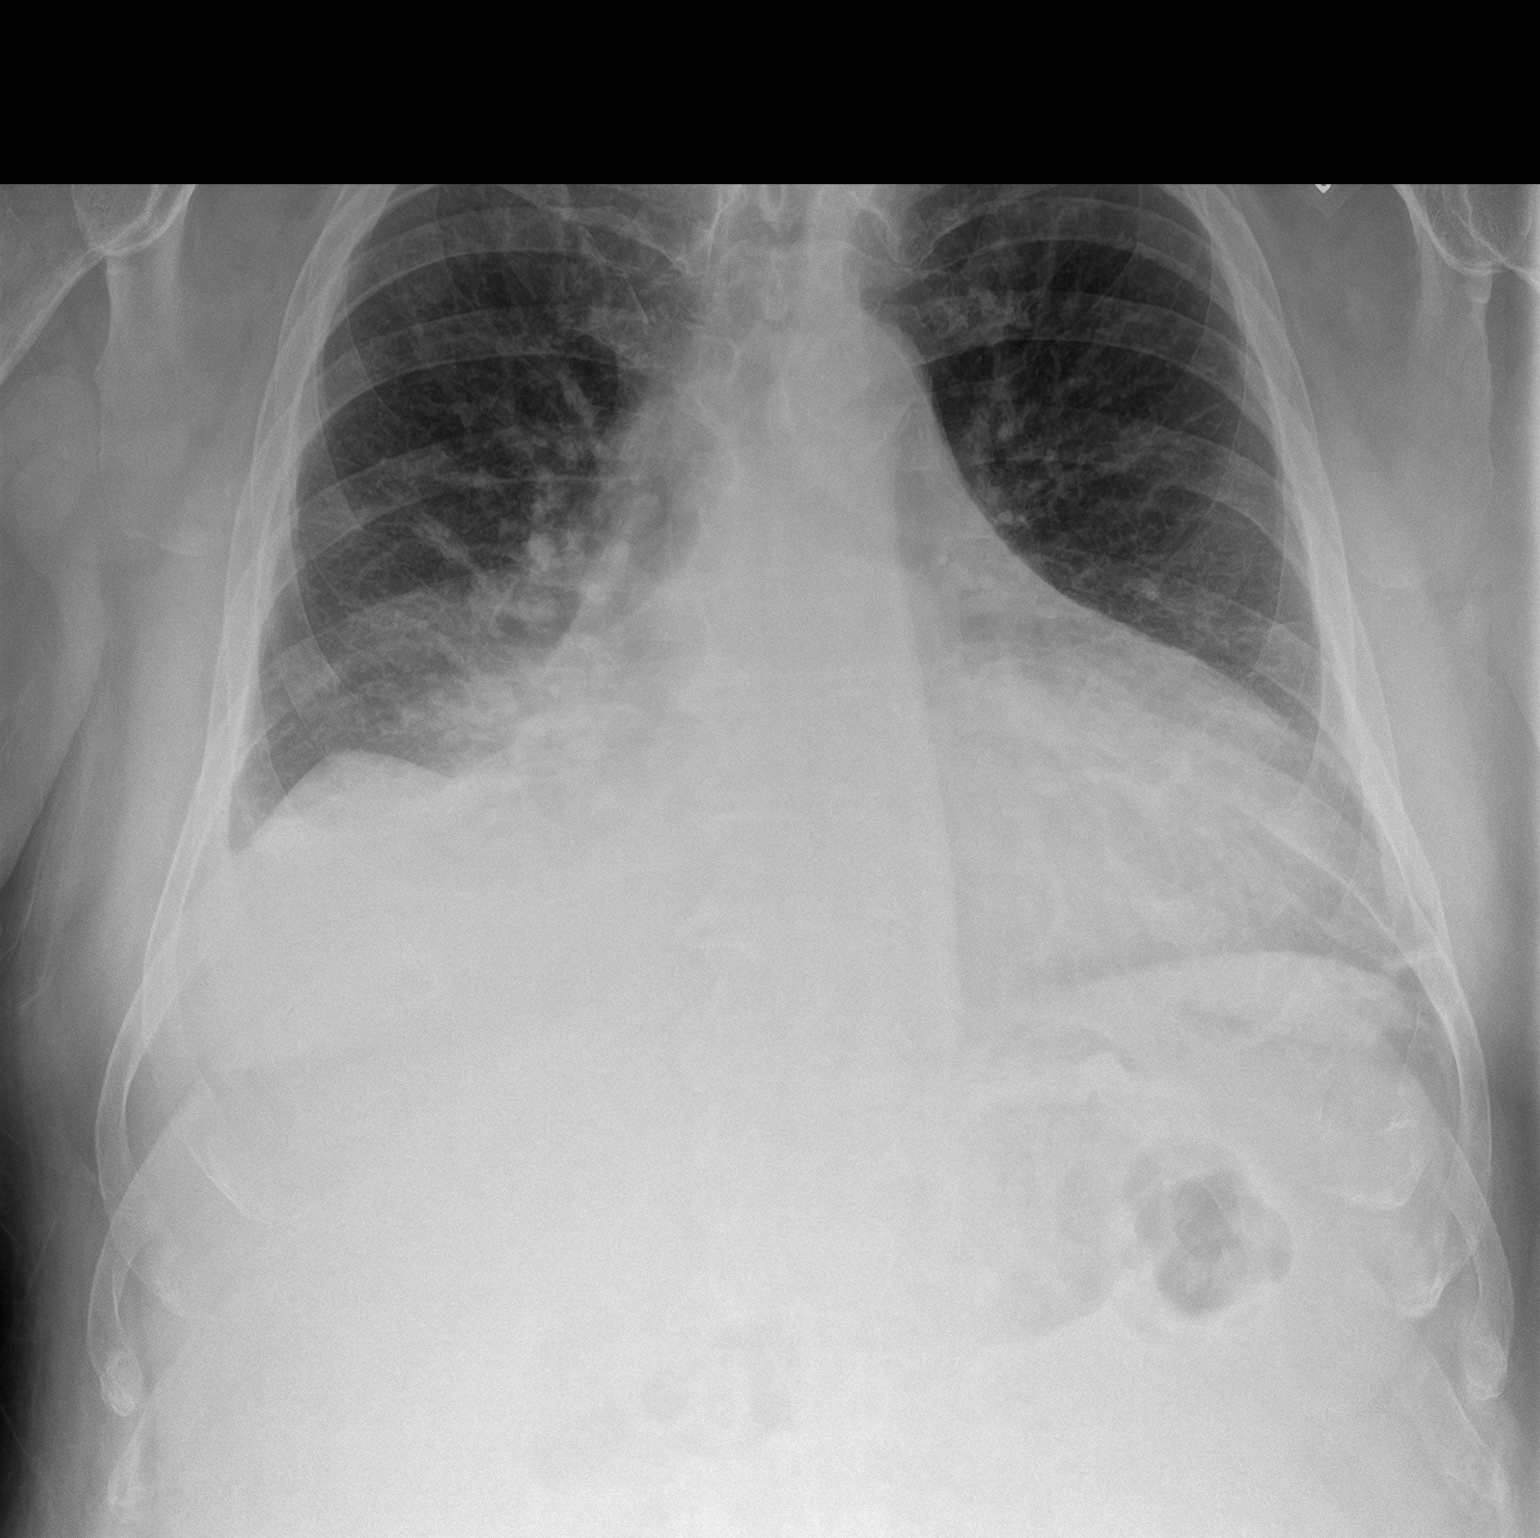

[chest lat]
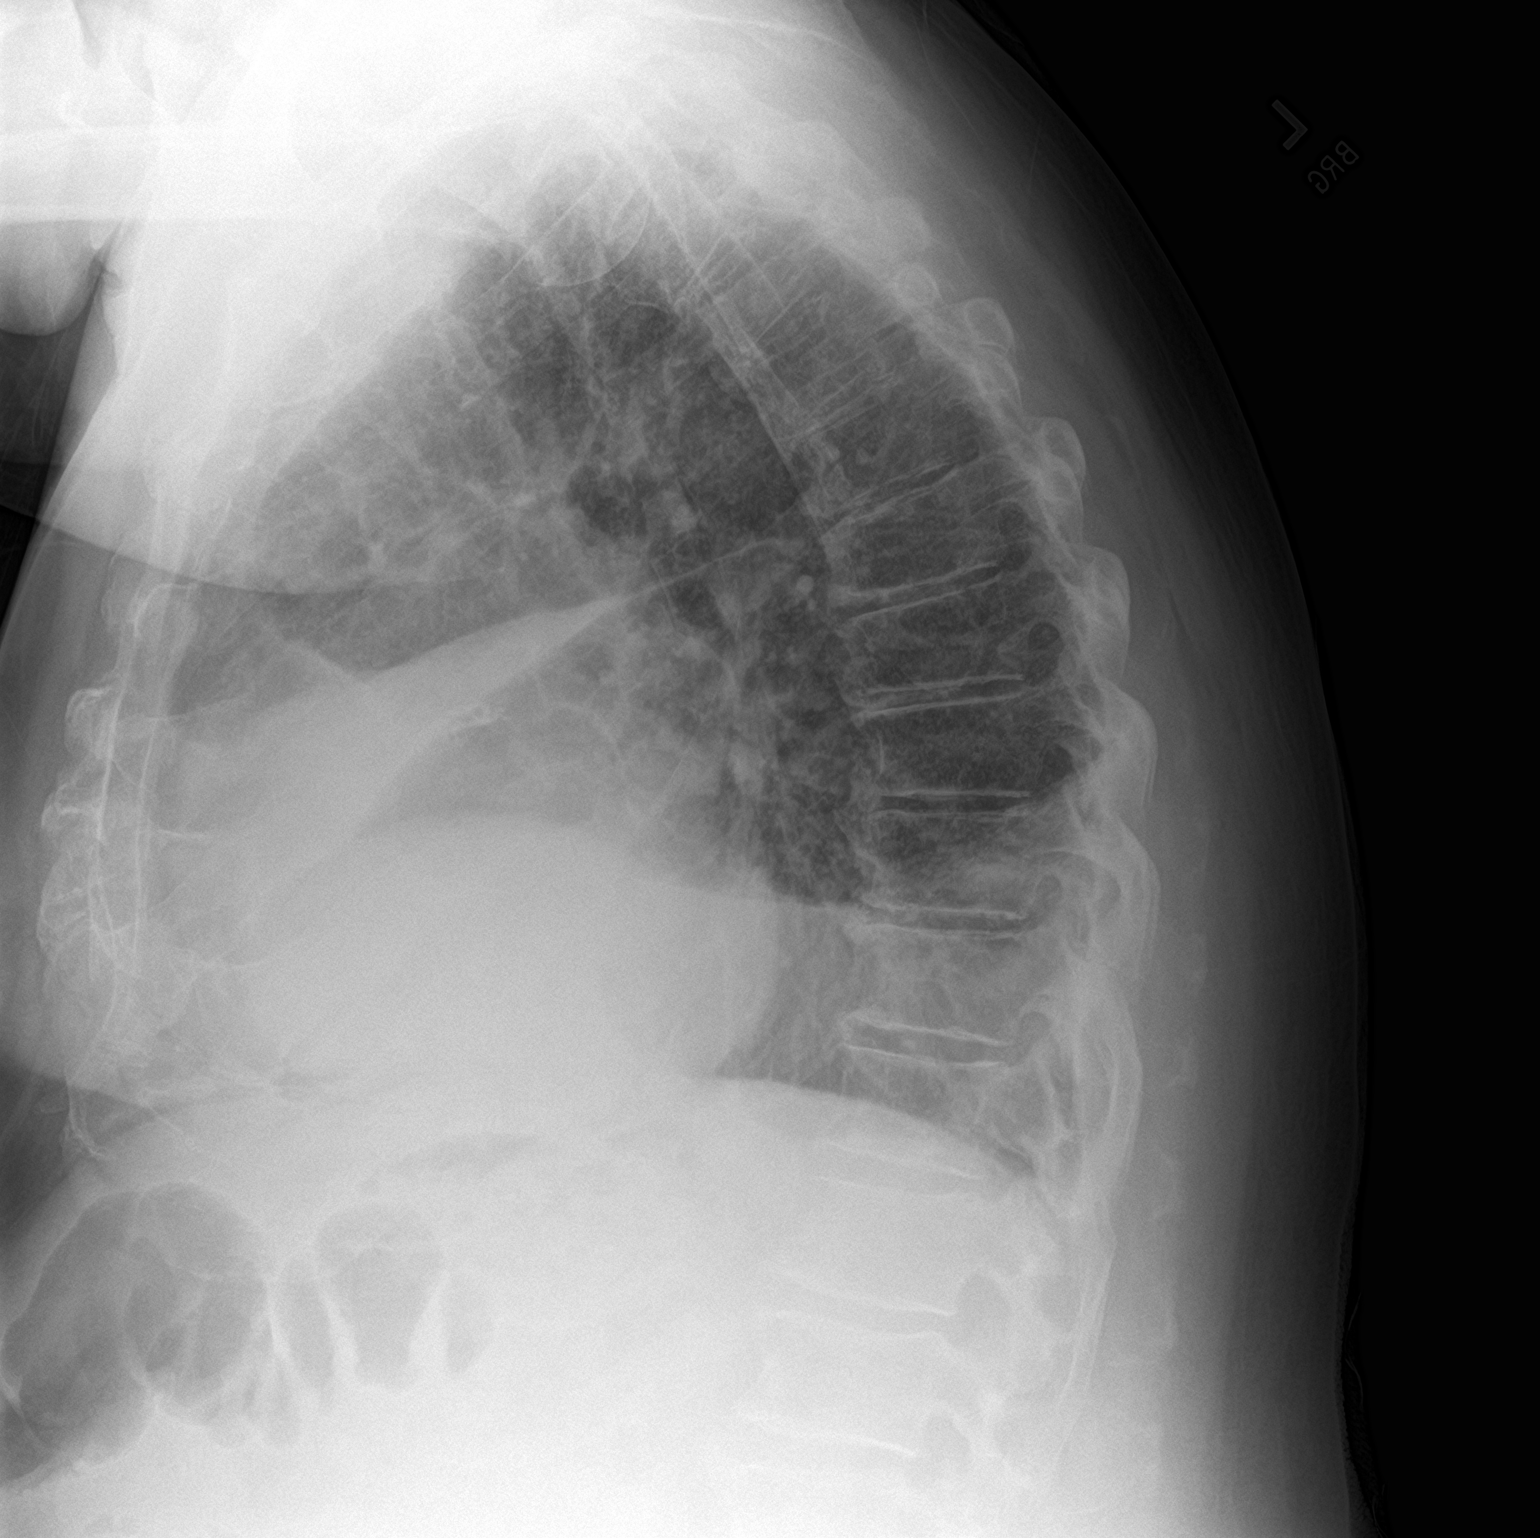

[2 of 2 positions shown; findings below may reference images not displayed]

FINDINGS: Cardiac silhouette is mildly enlarged. No mediastinal or hilar
masses or evidence of adenopathy.

Dense opacity with volume loss lies in the right middle lobe
consistent with atelectasis. Additional opacity noted at the right
lung base consistent with atelectasis possibly with a small
effusion. Mild linear atelectasis at the lateral left lung base.
Mild pleuroparenchymal scarring at the apices. Remainder of the
lungs is clear.

Elevated right hemidiaphragm.

No pneumothorax.

Skeletal structures are intact.
IMPRESSION: 1. Opacity with volume loss the right middle lobe most consistent
with atelectasis. No visualized mass although a central bronchial
obstruction is not excluded radiographically.
2. Mild opacity at the base of the right lower lobe, most likely
atelectasis. Possible small right pleural effusion. Pneumonia is not
excluded. No other evidence of acute cardiopulmonary disease.
3. Mild cardiomegaly.  No evidence of congestive heart failure.

## 2021-08-10 DIAGNOSIS — J449 Chronic obstructive pulmonary disease, unspecified: Secondary | ICD-10-CM | POA: Diagnosis not present

## 2021-08-18 ENCOUNTER — Other Ambulatory Visit: Payer: Self-pay

## 2021-08-18 ENCOUNTER — Ambulatory Visit
Admission: EM | Admit: 2021-08-18 | Discharge: 2021-08-18 | Disposition: A | Discharge status: Home / Self Care | Payer: Medicare Other | Attending: Urgent Care | Admitting: Urgent Care

## 2021-08-18 DIAGNOSIS — K130 Diseases of lips: Secondary | ICD-10-CM | POA: Diagnosis not present

## 2021-08-18 DIAGNOSIS — K122 Cellulitis and abscess of mouth: Secondary | ICD-10-CM | POA: Diagnosis not present

## 2021-08-18 MED ORDER — AMOXICILLIN-POT CLAVULANATE 875-125 MG PO TABS: 1.0000 | ORAL_TABLET | Freq: Two times a day (BID) | ORAL | 0 refills | Status: DC

## 2021-08-18 NOTE — ED Triage Notes (Signed)
Pt reports swelling and blister in the lower lip x 3 days

## 2021-08-18 NOTE — Discharge Instructions (Signed)
If you have no improvement within the next 48-72 hours then go to the hospital. Otherwise, take Augmentin for the abscess and cellulitis of the lip and oral soft tissue.

## 2021-08-18 NOTE — ED Provider Notes (Signed)
[place]   MRN: [id number] DOB: 08/31/1950  Subjective:   Perry Hunter is a 71 y.o. male presenting for 3-day history of acute onset persistent and worsening right-sided lower leg pain and swelling with redness.  Denies any dental pain, gum pain, bleeding, fevers, chest pain, shortness of breath.  Patient has a history of COPD, CHF and hypoxia.  They monitor the pulse oximetry at home and is generally in the low to mid 80s.  He has not felt any change in this regard.  No history of kidney disease.  No current facility-administered medications for this encounter.  Current Outpatient Medications:    acetaminophen (TYLENOL) 650 MG CR tablet, Take 1,300 mg by mouth daily., Disp: , Rfl:    Cholecalciferol (VITAMIN D3) 1000 units CAPS, Take 1 capsule by mouth daily., Disp: , Rfl:    digoxin 62.5 MCG TABS, Take 0.0625 mg by mouth daily., Disp: 30 tablet, Rfl: 1   ELIQUIS 5 MG TABS tablet, Take 5 mg by mouth 2 (two) times daily., Disp: , Rfl:    ferrous sulfate 325 (65 FE) MG tablet, Take 1 tablet (325 mg total) by mouth daily., Disp: 30 tablet, Rfl: 1   finasteride (PROSCAR) 5 MG tablet, Take 1 tablet (5 mg total) by mouth daily., Disp: 90 tablet, Rfl: 3   fluticasone furoate-vilanterol (BREO ELLIPTA) 200-25 MCG/INH AEPB, Inhale 1 puff into the lungs daily. (Patient not taking: Reported on 10/16/2020), Disp: 60 each, Rfl: 0   folic acid (FOLVITE) 1 MG tablet, Take 1 tablet (1 mg total) by mouth daily., Disp: 30 tablet, Rfl: 1   furosemide (LASIX) 40 MG tablet, TAKE (1) TABLET TWICE DAILY., Disp: 30 tablet, Rfl: 0   HYDROcodone-acetaminophen (NORCO/VICODIN) 5-325 MG tablet, Take 1 tablet by mouth 3 (three) times daily as needed for pain., Disp: , Rfl:    insulin detemir (LEVEMIR) 100 UNIT/ML injection, Inject 40 Units into the skin at bedtime., Disp: , Rfl:    metFORMIN (GLUCOPHAGE) 500 MG tablet, Take 1,000 mg by mouth daily with breakfast., Disp: , Rfl:    midodrine  (PROAMATINE) 5 MG tablet, Take 3 tablets (15 mg total) by mouth 3 (three) times daily with meals., Disp: 270 tablet, Rfl: 1   Omega-3 Fatty Acids (FISH OIL) 1000 MG CAPS, Take 1 capsule by mouth daily., Disp: , Rfl:    pantoprazole (PROTONIX) 40 MG tablet, Take 1 tablet (40 mg total) by mouth 2 (two) times daily., Disp: 60 tablet, Rfl: 1   potassium chloride SA (KLOR-CON) 20 MEQ tablet, Take 2 tablets (40 mEq total) by mouth daily., Disp: 30 tablet, Rfl: 1   PROAIR HFA 108 (90 Base) MCG/ACT inhaler, Inhale 2 puffs into the lungs 4 (four) times daily as needed for shortness of breath., Disp: , Rfl:    rosuvastatin (CRESTOR) 20 MG tablet, Take 1 tablet (20 mg total) by mouth daily., Disp: 30 tablet, Rfl: 1   sitaGLIPtin (JANUVIA) 100 MG tablet, Take 100 mg by mouth daily., Disp: , Rfl:    sucralfate (CARAFATE) 1 GM/10ML suspension, Take 10 mLs (1 g total) by mouth 4 (four) times daily for 16 days., Disp: 640 mL, Rfl: 0   tamsulosin (FLOMAX) 0.4 MG CAPS capsule, Take 0.8 mg by mouth every evening., Disp: , Rfl:    thiamine 100 MG tablet, Take 1 tablet (100 mg total) by mouth daily., Disp: 30 tablet, Rfl: 1   umeclidinium bromide (INCRUSE ELLIPTA) 62.5 MCG/INH AEPB, Inhale 1 puff into the lungs daily., Disp: 30 each,  Rfl: 1   No Known Allergies  Past Medical History:  Diagnosis Date   Arthritis    Asthma    CHF (congestive heart failure) (HCC)    Diabetes mellitus without complication (HCC)    GERD (gastroesophageal reflux disease)    Hypercholesteremia    Hypertension      Past Surgical History:  Procedure Laterality Date   BIOPSY  08/29/2020   Procedure: BIOPSY;  Surgeon: Rush Landmark Telford Nab., MD;  Location: [place];  Service: Gastroenterology;;   CARDIOVERSION N/A 09/07/2020   Procedure: CARDIOVERSION;  Surgeon: Jolaine Artist, MD;  Location: [facility] ENDOSCOPY;  Service: Cardiovascular;  Laterality: N/A;   ESOPHAGOGASTRODUODENOSCOPY (EGD) WITH PROPOFOL N/A 08/29/2020   Procedure:  ESOPHAGOGASTRODUODENOSCOPY (EGD) WITH PROPOFOL;  Surgeon: Irving Copas., MD;  Location: [place];  Service: Gastroenterology;  Laterality: N/A;   RIGHT/LEFT HEART CATH AND CORONARY ANGIOGRAPHY N/A 08/31/2020   Procedure: RIGHT/LEFT HEART CATH AND CORONARY ANGIOGRAPHY;  Surgeon: Jolaine Artist, MD;  Location: [place] CV LAB;  Service: Cardiovascular;  Laterality: N/A;   TEE WITHOUT CARDIOVERSION N/A 09/07/2020   Procedure: TRANSESOPHAGEAL ECHOCARDIOGRAM (TEE);  Surgeon: Jolaine Artist, MD;  Location: [facility] ENDOSCOPY;  Service: Cardiovascular;  Laterality: N/A;    No family history on file.  Social History   Tobacco Use   Smoking status: Every Day    Types: Cigarettes   Smokeless tobacco: Never  Substance Use Topics   Alcohol use: Not Currently   Drug use: Never    ROS   Objective:   Vitals: BP 104/66 (BP Location: Right Arm)    Temp 99.1 F (37.3 C) (Oral)    Resp 20    SpO2 (!) 88% Comment: Pt use 3L O2 at home.  Physical Exam Constitutional:      General: He is not in acute distress.    Appearance: Normal appearance. He is well-developed and normal weight. He is not ill-appearing, toxic-appearing or diaphoretic.  HENT:     Head: Normocephalic and atraumatic.      Right Ear: External ear normal.     Left Ear: External ear normal.     Nose: Nose normal.     Mouth/Throat:     Pharynx: Oropharynx is clear.     Comments: Inner right lower lip with swelling and erythema, exquisite tenderness. Eyes:     General: No scleral icterus.       Right eye: No discharge.        Left eye: No discharge.     Extraocular Movements: Extraocular movements intact.  Cardiovascular:     Rate and Rhythm: Normal rate.  Pulmonary:     Effort: Pulmonary effort is normal.  Musculoskeletal:     Cervical back: Normal range of motion.  Neurological:     Mental Status: He is alert and oriented to person, place, and time.  Psychiatric:        Mood and Affect: Mood normal.         Behavior: Behavior normal.        Thought Content: Thought content normal.        Judgment: Judgment normal.       Assessment and Plan :   PDMP not reviewed this encounter.  1. Cellulitis and abscess of oral soft tissues   2. Lip pain    Recommended covering for cellulitis and abscess of the oral soft tissues with Augmentin.  Use Tylenol for pain control.  Maintain strict ER precautions. Counseled patient on potential for adverse effects  with medications prescribed/recommended today, ER and return-to-clinic precautions discussed, patient verbalized understanding.    Jaynee Eagles, [place] 08/18/21 [phone]

## 2021-08-24 ENCOUNTER — Inpatient Hospital Stay (HOSPITAL_COMMUNITY)
Admission: EM | Admit: 2021-08-24 | Discharge: 2021-09-04 | DRG: 291 | Disposition: A | Discharge status: Hospice | Payer: Medicare Other | Attending: Internal Medicine | Admitting: Internal Medicine

## 2021-08-24 ENCOUNTER — Emergency Department (HOSPITAL_COMMUNITY): Payer: Medicare Other

## 2021-08-24 ENCOUNTER — Inpatient Hospital Stay (HOSPITAL_COMMUNITY): Payer: Medicare Other

## 2021-08-24 ENCOUNTER — Other Ambulatory Visit: Payer: Self-pay

## 2021-08-24 ENCOUNTER — Encounter (HOSPITAL_COMMUNITY): Payer: Self-pay | Admitting: Emergency Medicine

## 2021-08-24 DIAGNOSIS — R188 Other ascites: Secondary | ICD-10-CM | POA: Diagnosis not present

## 2021-08-24 DIAGNOSIS — I10 Essential (primary) hypertension: Secondary | ICD-10-CM | POA: Diagnosis present

## 2021-08-24 DIAGNOSIS — Z7984 Long term (current) use of oral hypoglycemic drugs: Secondary | ICD-10-CM

## 2021-08-24 DIAGNOSIS — R451 Restlessness and agitation: Secondary | ICD-10-CM | POA: Diagnosis not present

## 2021-08-24 DIAGNOSIS — R531 Weakness: Secondary | ICD-10-CM | POA: Diagnosis not present

## 2021-08-24 DIAGNOSIS — I9589 Other hypotension: Secondary | ICD-10-CM | POA: Diagnosis not present

## 2021-08-24 DIAGNOSIS — N179 Acute kidney failure, unspecified: Secondary | ICD-10-CM | POA: Diagnosis not present

## 2021-08-24 DIAGNOSIS — R570 Cardiogenic shock: Secondary | ICD-10-CM | POA: Diagnosis present

## 2021-08-24 DIAGNOSIS — E7800 Pure hypercholesterolemia, unspecified: Secondary | ICD-10-CM | POA: Diagnosis not present

## 2021-08-24 DIAGNOSIS — E860 Dehydration: Secondary | ICD-10-CM | POA: Diagnosis not present

## 2021-08-24 DIAGNOSIS — G9340 Encephalopathy, unspecified: Secondary | ICD-10-CM | POA: Diagnosis not present

## 2021-08-24 DIAGNOSIS — I4891 Unspecified atrial fibrillation: Secondary | ICD-10-CM | POA: Diagnosis not present

## 2021-08-24 DIAGNOSIS — Z794 Long term (current) use of insulin: Secondary | ICD-10-CM

## 2021-08-24 DIAGNOSIS — I255 Ischemic cardiomyopathy: Secondary | ICD-10-CM | POA: Diagnosis present

## 2021-08-24 DIAGNOSIS — R7401 Elevation of levels of liver transaminase levels: Secondary | ICD-10-CM

## 2021-08-24 DIAGNOSIS — R109 Unspecified abdominal pain: Secondary | ICD-10-CM | POA: Diagnosis not present

## 2021-08-24 DIAGNOSIS — Z20822 Contact with and (suspected) exposure to covid-19: Secondary | ICD-10-CM | POA: Diagnosis not present

## 2021-08-24 DIAGNOSIS — K760 Fatty (change of) liver, not elsewhere classified: Secondary | ICD-10-CM | POA: Diagnosis present

## 2021-08-24 DIAGNOSIS — Z79899 Other long term (current) drug therapy: Secondary | ICD-10-CM

## 2021-08-24 DIAGNOSIS — I4819 Other persistent atrial fibrillation: Secondary | ICD-10-CM | POA: Diagnosis not present

## 2021-08-24 DIAGNOSIS — E11649 Type 2 diabetes mellitus with hypoglycemia without coma: Secondary | ICD-10-CM | POA: Diagnosis not present

## 2021-08-24 DIAGNOSIS — E119 Type 2 diabetes mellitus without complications: Secondary | ICD-10-CM

## 2021-08-24 DIAGNOSIS — I700 Atherosclerosis of aorta: Secondary | ICD-10-CM | POA: Diagnosis not present

## 2021-08-24 DIAGNOSIS — I110 Hypertensive heart disease with heart failure: Secondary | ICD-10-CM | POA: Diagnosis not present

## 2021-08-24 DIAGNOSIS — E118 Type 2 diabetes mellitus with unspecified complications: Secondary | ICD-10-CM

## 2021-08-24 DIAGNOSIS — G9341 Metabolic encephalopathy: Secondary | ICD-10-CM | POA: Diagnosis not present

## 2021-08-24 DIAGNOSIS — R7989 Other specified abnormal findings of blood chemistry: Secondary | ICD-10-CM | POA: Diagnosis not present

## 2021-08-24 DIAGNOSIS — D638 Anemia in other chronic diseases classified elsewhere: Secondary | ICD-10-CM | POA: Diagnosis not present

## 2021-08-24 DIAGNOSIS — R609 Edema, unspecified: Secondary | ICD-10-CM

## 2021-08-24 DIAGNOSIS — K761 Chronic passive congestion of liver: Secondary | ICD-10-CM | POA: Diagnosis present

## 2021-08-24 DIAGNOSIS — E875 Hyperkalemia: Secondary | ICD-10-CM | POA: Diagnosis not present

## 2021-08-24 DIAGNOSIS — R6520 Severe sepsis without septic shock: Secondary | ICD-10-CM | POA: Diagnosis not present

## 2021-08-24 DIAGNOSIS — G4733 Obstructive sleep apnea (adult) (pediatric): Secondary | ICD-10-CM | POA: Diagnosis present

## 2021-08-24 DIAGNOSIS — Z515 Encounter for palliative care: Secondary | ICD-10-CM | POA: Diagnosis not present

## 2021-08-24 DIAGNOSIS — K219 Gastro-esophageal reflux disease without esophagitis: Secondary | ICD-10-CM | POA: Diagnosis not present

## 2021-08-24 DIAGNOSIS — J449 Chronic obstructive pulmonary disease, unspecified: Secondary | ICD-10-CM | POA: Diagnosis not present

## 2021-08-24 DIAGNOSIS — I480 Paroxysmal atrial fibrillation: Secondary | ICD-10-CM | POA: Diagnosis present

## 2021-08-24 DIAGNOSIS — R579 Shock, unspecified: Secondary | ICD-10-CM | POA: Diagnosis not present

## 2021-08-24 DIAGNOSIS — Z66 Do not resuscitate: Secondary | ICD-10-CM | POA: Diagnosis not present

## 2021-08-24 DIAGNOSIS — E872 Acidosis, unspecified: Secondary | ICD-10-CM | POA: Diagnosis present

## 2021-08-24 DIAGNOSIS — I3139 Other pericardial effusion (noninflammatory): Secondary | ICD-10-CM | POA: Diagnosis present

## 2021-08-24 DIAGNOSIS — R5381 Other malaise: Secondary | ICD-10-CM

## 2021-08-24 DIAGNOSIS — A419 Sepsis, unspecified organism: Principal | ICD-10-CM | POA: Diagnosis present

## 2021-08-24 DIAGNOSIS — Z7901 Long term (current) use of anticoagulants: Secondary | ICD-10-CM

## 2021-08-24 DIAGNOSIS — E1169 Type 2 diabetes mellitus with other specified complication: Secondary | ICD-10-CM

## 2021-08-24 DIAGNOSIS — N182 Chronic kidney disease, stage 2 (mild): Secondary | ICD-10-CM | POA: Diagnosis not present

## 2021-08-24 DIAGNOSIS — I5023 Acute on chronic systolic (congestive) heart failure: Secondary | ICD-10-CM | POA: Diagnosis not present

## 2021-08-24 DIAGNOSIS — N400 Enlarged prostate without lower urinary tract symptoms: Secondary | ICD-10-CM | POA: Diagnosis present

## 2021-08-24 DIAGNOSIS — Z8719 Personal history of other diseases of the digestive system: Secondary | ICD-10-CM

## 2021-08-24 DIAGNOSIS — F05 Delirium due to known physiological condition: Secondary | ICD-10-CM | POA: Diagnosis not present

## 2021-08-24 DIAGNOSIS — R6521 Severe sepsis with septic shock: Secondary | ICD-10-CM | POA: Diagnosis not present

## 2021-08-24 DIAGNOSIS — R748 Abnormal levels of other serum enzymes: Secondary | ICD-10-CM | POA: Diagnosis not present

## 2021-08-24 DIAGNOSIS — J9601 Acute respiratory failure with hypoxia: Secondary | ICD-10-CM | POA: Diagnosis present

## 2021-08-24 DIAGNOSIS — R791 Abnormal coagulation profile: Secondary | ICD-10-CM | POA: Diagnosis present

## 2021-08-24 DIAGNOSIS — J9602 Acute respiratory failure with hypercapnia: Secondary | ICD-10-CM | POA: Diagnosis present

## 2021-08-24 DIAGNOSIS — Z7951 Long term (current) use of inhaled steroids: Secondary | ICD-10-CM

## 2021-08-24 DIAGNOSIS — F17210 Nicotine dependence, cigarettes, uncomplicated: Secondary | ICD-10-CM | POA: Diagnosis not present

## 2021-08-24 DIAGNOSIS — M1990 Unspecified osteoarthritis, unspecified site: Secondary | ICD-10-CM | POA: Diagnosis present

## 2021-08-24 DIAGNOSIS — E1121 Type 2 diabetes mellitus with diabetic nephropathy: Secondary | ICD-10-CM | POA: Diagnosis not present

## 2021-08-24 DIAGNOSIS — R000 Tachycardia, unspecified: Secondary | ICD-10-CM | POA: Diagnosis not present

## 2021-08-24 DIAGNOSIS — J811 Chronic pulmonary edema: Secondary | ICD-10-CM | POA: Diagnosis not present

## 2021-08-24 DIAGNOSIS — I509 Heart failure, unspecified: Secondary | ICD-10-CM | POA: Diagnosis not present

## 2021-08-24 DIAGNOSIS — I5032 Chronic diastolic (congestive) heart failure: Secondary | ICD-10-CM | POA: Diagnosis not present

## 2021-08-24 DIAGNOSIS — I2510 Atherosclerotic heart disease of native coronary artery without angina pectoris: Secondary | ICD-10-CM | POA: Diagnosis present

## 2021-08-24 DIAGNOSIS — I517 Cardiomegaly: Secondary | ICD-10-CM | POA: Diagnosis not present

## 2021-08-24 DIAGNOSIS — J90 Pleural effusion, not elsewhere classified: Secondary | ICD-10-CM | POA: Diagnosis not present

## 2021-08-24 LAB — CBC WITH DIFFERENTIAL/PLATELET
Abs Immature Granulocytes: 0.05 10*3/uL (ref 0.00–0.07)
Basophils Absolute: 0 10*3/uL (ref 0.0–0.1)
Basophils Relative: 0 %
Eosinophils Absolute: 0 10*3/uL (ref 0.0–0.5)
Eosinophils Relative: 0 %
HCT: 33.4 % — ABNORMAL LOW (ref 39.0–52.0)
Hemoglobin: 8.8 g/dL — ABNORMAL LOW (ref 13.0–17.0)
Immature Granulocytes: 1 %
Lymphocytes Relative: 6 %
Lymphs Abs: 0.5 10*3/uL — ABNORMAL LOW (ref 0.7–4.0)
MCH: 15.8 pg — ABNORMAL LOW (ref 26.0–34.0)
MCHC: 26.3 g/dL — ABNORMAL LOW (ref 30.0–36.0)
MCV: 60.1 fL — ABNORMAL LOW (ref 80.0–100.0)
Monocytes Absolute: 1 10*3/uL (ref 0.1–1.0)
Monocytes Relative: 12 %
Neutro Abs: 6.8 10*3/uL (ref 1.7–7.7)
Neutrophils Relative %: 81 %
Platelets: 222 10*3/uL (ref 150–400)
RBC: 5.56 MIL/uL (ref 4.22–5.81)
RDW: 23.1 % — ABNORMAL HIGH (ref 11.5–15.5)
WBC: 8.3 10*3/uL (ref 4.0–10.5)
nRBC: 0.7 % — ABNORMAL HIGH (ref 0.0–0.2)

## 2021-08-24 LAB — APTT: aPTT: 33 seconds (ref 24–36)

## 2021-08-24 LAB — COMPREHENSIVE METABOLIC PANEL
ALT: 50 U/L — ABNORMAL HIGH (ref 0–44)
AST: 53 U/L — ABNORMAL HIGH (ref 15–41)
Albumin: 3.1 g/dL — ABNORMAL LOW (ref 3.5–5.0)
Alkaline Phosphatase: 107 U/L (ref 38–126)
Anion gap: 10 (ref 5–15)
BUN: 21 mg/dL (ref 8–23)
CO2: 27 mmol/L (ref 22–32)
Calcium: 8.2 mg/dL — ABNORMAL LOW (ref 8.9–10.3)
Chloride: 97 mmol/L — ABNORMAL LOW (ref 98–111)
Creatinine, Ser: 1.71 mg/dL — ABNORMAL HIGH (ref 0.61–1.24)
GFR, Estimated: 42 mL/min — ABNORMAL LOW (ref >60)
Glucose, Bld: 112 mg/dL — ABNORMAL HIGH (ref 70–99)
Potassium: 4.9 mmol/L (ref 3.5–5.1)
Sodium: 134 mmol/L — ABNORMAL LOW (ref 135–145)
Total Bilirubin: 1.9 mg/dL — ABNORMAL HIGH (ref 0.3–1.2)
Total Protein: 7.4 g/dL (ref 6.5–8.1)

## 2021-08-24 LAB — PROTIME-INR
INR: 2 — ABNORMAL HIGH (ref 0.8–1.2)
Prothrombin Time: 22.7 seconds — ABNORMAL HIGH (ref 11.4–15.2)

## 2021-08-24 LAB — AMMONIA: Ammonia: 29 umol/L (ref 9–35)

## 2021-08-24 LAB — DIGOXIN LEVEL: Digoxin Level: 0.8 ng/mL (ref 0.8–2.0)

## 2021-08-24 LAB — LACTIC ACID, PLASMA
Lactic Acid, Venous: 7.3 mmol/L (ref 0.5–1.9)
Lactic Acid, Venous: 4.4 mmol/L (ref 0.5–1.9)

## 2021-08-24 LAB — GLUCOSE, CAPILLARY: Glucose-Capillary: 126 mg/dL — ABNORMAL HIGH (ref 70–99)

## 2021-08-24 LAB — I-STAT CHEM 8, ED
BUN: 21 mg/dL (ref 8–23)
Calcium, Ion: 1.06 mmol/L — ABNORMAL LOW (ref 1.15–1.40)
Chloride: 99 mmol/L (ref 98–111)
Creatinine, Ser: 1.5 mg/dL — ABNORMAL HIGH (ref 0.61–1.24)
Glucose, Bld: 106 mg/dL — ABNORMAL HIGH (ref 70–99)
HCT: 41 % (ref 39.0–52.0)
Hemoglobin: 13.9 g/dL (ref 13.0–17.0)
Potassium: 5 mmol/L (ref 3.5–5.1)
Sodium: 136 mmol/L (ref 135–145)
TCO2: 24 mmol/L (ref 22–32)

## 2021-08-24 LAB — BLOOD GAS, ARTERIAL
Acid-base deficit: 1.5 mmol/L (ref 0.0–2.0)
Bicarbonate: 22.1 mmol/L (ref 20.0–28.0)
Drawn by: 22223
FIO2: 52
O2 Saturation: 90.2 %
Patient temperature: 37
pCO2 arterial: 74.8 mmHg (ref 32.0–48.0)
pH, Arterial: 7.168 — CL (ref 7.350–7.450)
pO2, Arterial: 77.3 mmHg — ABNORMAL LOW (ref 83.0–108.0)

## 2021-08-24 LAB — RESP PANEL BY RT-PCR (FLU A&B, COVID) ARPGX2
Influenza A by PCR: NEGATIVE
Influenza B by PCR: NEGATIVE
SARS Coronavirus 2 by RT PCR: NEGATIVE

## 2021-08-24 LAB — CBG MONITORING, ED: Glucose-Capillary: 112 mg/dL — ABNORMAL HIGH (ref 70–99)

## 2021-08-24 MED ORDER — SODIUM CHLORIDE 0.9 % IV SOLN
2.0000 g | Freq: Once | INTRAVENOUS | Status: AC
  Administered 2021-08-24: 2 g via INTRAVENOUS
  Filled 2021-08-24: qty 2

## 2021-08-24 MED ORDER — LACTATED RINGERS IV SOLN: INTRAVENOUS | Status: AC

## 2021-08-24 MED ORDER — VANCOMYCIN HCL IN DEXTROSE 1-5 GM/200ML-% IV SOLN
1000.0000 mg | Freq: Once | INTRAVENOUS | Status: AC
  Administered 2021-08-24: 1000 mg via INTRAVENOUS
  Filled 2021-08-24: qty 200

## 2021-08-24 MED ORDER — DIGOXIN 0.0625 MG HALF TABLET
0.0625 mg | ORAL_TABLET | Freq: Every day | ORAL | Status: DC
  Administered 2021-08-25 – 2021-09-02 (×8): 0.0625 mg via ORAL
  Filled 2021-08-24 (×11): qty 1

## 2021-08-24 MED ORDER — NOREPINEPHRINE 4 MG/250ML-% IV SOLN
INTRAVENOUS | Status: AC
  Administered 2021-08-24: 2 ug/min via INTRAVENOUS
  Filled 2021-08-24: qty 250

## 2021-08-24 MED ORDER — ROSUVASTATIN CALCIUM 20 MG PO TABS
20.0000 mg | ORAL_TABLET | Freq: Every day | ORAL | Status: DC
  Administered 2021-08-25: 20 mg via ORAL
  Filled 2021-08-24: qty 1

## 2021-08-24 MED ORDER — HYDROCODONE-ACETAMINOPHEN 5-325 MG PO TABS
1.0000 | ORAL_TABLET | Freq: Three times a day (TID) | ORAL | Status: DC | PRN
  Administered 2021-08-28 – 2021-08-31 (×2): 1 via ORAL
  Filled 2021-08-24 (×2): qty 1

## 2021-08-24 MED ORDER — MIDODRINE HCL 5 MG PO TABS
15.0000 mg | ORAL_TABLET | Freq: Three times a day (TID) | ORAL | Status: DC
  Administered 2021-08-24 – 2021-09-04 (×25): 15 mg via ORAL
  Filled 2021-08-24 (×26): qty 3

## 2021-08-24 MED ORDER — NOREPINEPHRINE 4 MG/250ML-% IV SOLN
2.0000 ug/min | INTRAVENOUS | Status: DC
  Administered 2021-08-25: 7 ug/min via INTRAVENOUS
  Administered 2021-08-26: 4 ug/min via INTRAVENOUS
  Filled 2021-08-24 (×2): qty 250

## 2021-08-24 MED ORDER — ONDANSETRON HCL 4 MG/2ML IJ SOLN: 4.0000 mg | Freq: Four times a day (QID) | INTRAMUSCULAR | Status: DC | PRN

## 2021-08-24 MED ORDER — LORAZEPAM 2 MG/ML IJ SOLN
1.0000 mg | INTRAMUSCULAR | Status: DC | PRN
  Administered 2021-08-24 – 2021-09-01 (×7): 1 mg via INTRAVENOUS
  Filled 2021-08-24 (×7): qty 1

## 2021-08-24 MED ORDER — ENOXAPARIN SODIUM 100 MG/ML IJ SOSY
100.0000 mg | PREFILLED_SYRINGE | Freq: Two times a day (BID) | INTRAMUSCULAR | Status: DC
  Administered 2021-08-25: 100 mg via SUBCUTANEOUS
  Filled 2021-08-24: qty 1

## 2021-08-24 MED ORDER — LACTATED RINGERS IV BOLUS
1000.0000 mL | Freq: Once | INTRAVENOUS | Status: AC
  Administered 2021-08-24: 1000 mL via INTRAVENOUS

## 2021-08-24 MED ORDER — NOREPINEPHRINE 4 MG/250ML-% IV SOLN: 0.0000 ug/min | INTRAVENOUS | Status: DC

## 2021-08-24 MED ORDER — LACTATED RINGERS IV BOLUS (SEPSIS): 2000.0000 mL | Freq: Once | INTRAVENOUS | Status: DC

## 2021-08-24 MED ORDER — UMECLIDINIUM BROMIDE 62.5 MCG/ACT IN AEPB
1.0000 | INHALATION_SPRAY | Freq: Every day | RESPIRATORY_TRACT | Status: DC
  Administered 2021-08-26 – 2021-08-31 (×6): 1 via RESPIRATORY_TRACT
  Filled 2021-08-24: qty 7

## 2021-08-24 MED ORDER — INSULIN DETEMIR 100 UNIT/ML ~~LOC~~ SOLN
20.0000 [IU] | Freq: Every day | SUBCUTANEOUS | Status: DC
  Administered 2021-08-25 – 2021-08-27 (×3): 20 [IU] via SUBCUTANEOUS
  Filled 2021-08-24 (×5): qty 0.2

## 2021-08-24 MED ORDER — CHLORHEXIDINE GLUCONATE CLOTH 2 % EX PADS
6.0000 | MEDICATED_PAD | Freq: Every day | CUTANEOUS | Status: DC
  Administered 2021-08-25 – 2021-08-31 (×6): 6 via TOPICAL

## 2021-08-24 MED ORDER — SODIUM CHLORIDE 0.9 % IV SOLN
250.0000 mL | INTRAVENOUS | Status: DC
  Administered 2021-08-28: 250 mL via INTRAVENOUS

## 2021-08-24 MED ORDER — METRONIDAZOLE 500 MG/100ML IV SOLN
500.0000 mg | Freq: Once | INTRAVENOUS | Status: AC
  Administered 2021-08-24: 500 mg via INTRAVENOUS
  Filled 2021-08-24: qty 100

## 2021-08-24 MED ORDER — SODIUM CHLORIDE 0.9 % IV SOLN
2.0000 g | Freq: Two times a day (BID) | INTRAVENOUS | Status: DC
  Administered 2021-08-25 – 2021-08-27 (×6): 2 g via INTRAVENOUS
  Filled 2021-08-24 (×6): qty 2

## 2021-08-24 MED ORDER — FINASTERIDE 5 MG PO TABS
5.0000 mg | ORAL_TABLET | Freq: Every day | ORAL | Status: DC
  Administered 2021-08-25 – 2021-09-04 (×10): 5 mg via ORAL
  Filled 2021-08-24 (×11): qty 1

## 2021-08-24 MED ORDER — TAMSULOSIN HCL 0.4 MG PO CAPS: 0.8000 mg | ORAL_CAPSULE | Freq: Every evening | ORAL | Status: DC

## 2021-08-24 MED ORDER — ONDANSETRON HCL 4 MG PO TABS: 4.0000 mg | ORAL_TABLET | Freq: Four times a day (QID) | ORAL | Status: DC | PRN

## 2021-08-24 MED ORDER — IOHEXOL 300 MG/ML  SOLN
80.0000 mL | Freq: Once | INTRAMUSCULAR | Status: AC | PRN
  Administered 2021-08-24: 80 mL via INTRAVENOUS

## 2021-08-24 MED ORDER — APIXABAN 5 MG PO TABS: 5.0000 mg | ORAL_TABLET | Freq: Two times a day (BID) | ORAL | Status: DC

## 2021-08-24 MED ORDER — SUCRALFATE 1 GM/10ML PO SUSP
1.0000 g | Freq: Four times a day (QID) | ORAL | Status: DC
  Administered 2021-08-25 – 2021-09-02 (×26): 1 g via ORAL
  Filled 2021-08-24 (×26): qty 10

## 2021-08-24 MED ORDER — ACETAMINOPHEN 500 MG PO TABS
1000.0000 mg | ORAL_TABLET | Freq: Once | ORAL | Status: AC
  Administered 2021-08-24: 1000 mg via ORAL
  Filled 2021-08-24: qty 2

## 2021-08-24 MED ORDER — LORAZEPAM 2 MG/ML IJ SOLN
0.5000 mg | Freq: Once | INTRAMUSCULAR | Status: AC
  Administered 2021-08-24: 0.5 mg via INTRAVENOUS
  Filled 2021-08-24: qty 1

## 2021-08-24 MED ORDER — DILTIAZEM HCL-DEXTROSE 125-5 MG/125ML-% IV SOLN (PREMIX)
5.0000 mg/h | INTRAVENOUS | Status: DC
  Administered 2021-08-24: 5 mg/h via INTRAVENOUS
  Filled 2021-08-24: qty 125

## 2021-08-24 MED ORDER — ALBUTEROL SULFATE (2.5 MG/3ML) 0.083% IN NEBU
3.0000 mL | INHALATION_SOLUTION | Freq: Four times a day (QID) | RESPIRATORY_TRACT | Status: DC | PRN
  Administered 2021-08-26 – 2021-09-03 (×5): 3 mL via RESPIRATORY_TRACT
  Filled 2021-08-24 (×8): qty 3

## 2021-08-24 MED ORDER — INSULIN ASPART 100 UNIT/ML IJ SOLN
0.0000 [IU] | INTRAMUSCULAR | Status: DC
  Administered 2021-08-25 – 2021-08-27 (×3): 2 [IU] via SUBCUTANEOUS

## 2021-08-24 MED ORDER — PANTOPRAZOLE SODIUM 40 MG IV SOLR
40.0000 mg | Freq: Two times a day (BID) | INTRAVENOUS | Status: DC
  Administered 2021-08-25 – 2021-08-31 (×14): 40 mg via INTRAVENOUS
  Filled 2021-08-24 (×14): qty 40

## 2021-08-24 MED ORDER — VANCOMYCIN HCL 1250 MG/250ML IV SOLN
1250.0000 mg | INTRAVENOUS | Status: DC
  Administered 2021-08-25 – 2021-08-27 (×3): 1250 mg via INTRAVENOUS
  Filled 2021-08-24 (×3): qty 250

## 2021-08-24 MED ORDER — MIDODRINE HCL 5 MG PO TABS: 15.0000 mg | ORAL_TABLET | Freq: Three times a day (TID) | ORAL | Status: DC

## 2021-08-24 NOTE — Progress Notes (Signed)
ANTICOAGULATION CONSULT NOTE - Initial Consult  Pharmacy Consult for Lovenox (Apixaban on hold) Indication: atrial fibrillation  No Known Allergies  Patient Measurements: Height: 5\' 8"  (172.7 cm) Weight: 102.6 kg (226 lb 3.1 oz) IBW/kg (Calculated) : 68.4  Vital Signs: Temp: 98.6 F (37 C) (01/27 2340) Temp Source: Axillary (01/27 2340) BP: 92/55 (01/27 2255) Pulse Rate: 101 (01/27 2329)  Labs: Recent Labs    08/24/21 1602 08/24/21 1603  HGB 8.8* 13.9  HCT 33.4* 41.0  PLT 222  --   APTT 33  --   LABPROT 22.7*  --   INR 2.0*  --   CREATININE 1.71* 1.50*    Estimated Creatinine Clearance: 51.7 mL/min (A) (by C-G formula based on SCr of 1.5 mg/dL (H)).   Medical History: Past Medical History:  Diagnosis Date   Arthritis    Asthma    CHF (congestive heart failure) ([place])    Diabetes mellitus without complication (HCC)    GERD (gastroesophageal reflux disease)    Hypercholesteremia    Hypertension      Assessment: Holding apixaban and starting Lovenox for afib. It has been >12 hours since last apixaban dose, ok to start Lovenox now. Labs as above-noted renal dysfunction.  Goal of Therapy:  Monitor platelets by anticoagulation protocol: Yes   Plan:  Lovenox 1 mg/kg subcutaneous q12h Daily CBC F/U ability to switch back to [place], PharmD, [place] Pharmacist Phone: [phone]

## 2021-08-24 NOTE — ED Notes (Signed)
Left femoral central line access by Dr Matilde Sprang unsuccessful- attempting CVC to left IJ at this time.

## 2021-08-24 NOTE — ED Notes (Addendum)
Dr Nehemiah Settle and Dr Matilde Sprang at bedside. EDP Kommor starting central line- right femoral attempted-unsuccessful-  EDP will attempt left femoral.

## 2021-08-24 NOTE — H&P (Signed)
History and Physical  Orris Perin VOH:[id number] DOB: 26-Nov-1950 DOA: 08/24/2021  Referring physician: Dr Matilde Sprang, ED physician PCP: Neale Burly, MD  Outpatient Specialists:   Patient Coming From: home  Chief Complaint: Confusion   HPI: Yi Haugan is a 71 y.o. male with a history of diabetes, CHF, hypertension, COPD, paroxysmal atrial fibrillation on anticoagulation, BPH, obstructive sleep apnea not on CPAP.  Patient brought to the hospital for increasing confusion, shortness of breath, fever.  Patient is unable to provide history given the degree of confusion.  History is obtained from the patient's daughter and the patient's granddaughter.  Patient seen recently for concerns of a dental infection and was prescribed antibiotics at the care approximately 6 days ago.  The area in question appears to be an aphthous ulcer.  However, the patient was placed on Augmentin.  The patient began to have diarrhea and stopped taking his medications.  This morning, the patient became extremely confused and altered and the patient was brought to the hospital for evaluation.  On arrival, the patient was hypoxic to 85% on room air.  It increased to normal on 4 L nasal cannula.  He was initially hypotensive and febrile.  The patient was started on IV fluids and received 2 L bolus.  Additionally, the patient is on midodrine and was given that with good response to his blood pressures.  Emergency Department Course: Room antibiotics given: Cefepime, vancomycin.  CT chest, abdomen, pelvis was done showing no acute process.  His fever has improved, although the patient remains very confused initially his lactic acid was 7.3.  After 2 L of IV fluids, this has improved to 4.4.  Review of Systems:  Unable to obtain   Past Medical History:  Diagnosis Date   Arthritis    Asthma    CHF (congestive heart failure) (Ages)    Diabetes mellitus without complication (HCC)    GERD (gastroesophageal reflux disease)     Hypercholesteremia    Hypertension    Past Surgical History:  Procedure Laterality Date   BIOPSY  08/29/2020   Procedure: BIOPSY;  Surgeon: Rush Landmark Telford Nab., MD;  Location: [facility];  Service: Gastroenterology;;   CARDIOVERSION N/A 09/07/2020   Procedure: CARDIOVERSION;  Surgeon: Jolaine Artist, MD;  Location: [facility] ENDOSCOPY;  Service: Cardiovascular;  Laterality: N/A;   ESOPHAGOGASTRODUODENOSCOPY (EGD) WITH PROPOFOL N/A 08/29/2020   Procedure: ESOPHAGOGASTRODUODENOSCOPY (EGD) WITH PROPOFOL;  Surgeon: Irving Copas., MD;  Location: [facility] ENDOSCOPY;  Service: Gastroenterology;  Laterality: N/A;   RIGHT/LEFT HEART CATH AND CORONARY ANGIOGRAPHY N/A 08/31/2020   Procedure: RIGHT/LEFT HEART CATH AND CORONARY ANGIOGRAPHY;  Surgeon: Jolaine Artist, MD;  Location: [facility] CV LAB;  Service: Cardiovascular;  Laterality: N/A;   TEE WITHOUT CARDIOVERSION N/A 09/07/2020   Procedure: TRANSESOPHAGEAL ECHOCARDIOGRAM (TEE);  Surgeon: Jolaine Artist, MD;  Location: [facility] ENDOSCOPY;  Service: Cardiovascular;  Laterality: N/A;   Social History:  reports that he has been smoking cigarettes. He has never used smokeless tobacco. He reports that he does not currently use alcohol. He reports that he does not use drugs. Patient lives at home with patient's daughter and granddaughter  No Known Allergies  History reviewed. No pertinent family history.   Prior to Admission medications   Medication Sig Start Date End Date Taking? Authorizing Provider  acetaminophen (TYLENOL) 650 MG CR tablet Take 1,300 mg by mouth daily.    [provider]  amoxicillin-clavulanate (AUGMENTIN) 875-125 MG tablet Take 1 tablet by mouth 2 (two) times daily. 08/18/21  Jaynee Eagles, PA-C  Cholecalciferol (VITAMIN D3) 1000 units CAPS Take 1 capsule by mouth daily.    [provider]  digoxin 62.5 MCG TABS Take 0.0625 mg by mouth daily. 09/10/20   Shelly Coss, MD  ELIQUIS 5 MG TABS tablet Take 5 mg  by mouth 2 (two) times daily. 08/16/20   [provider]  ferrous sulfate 325 (65 FE) MG tablet Take 1 tablet (325 mg total) by mouth daily. 09/09/20 09/09/21  Shelly Coss, MD  finasteride (PROSCAR) 5 MG tablet Take 1 tablet (5 mg total) by mouth daily. 10/16/20   Festus Aloe, MD  fluticasone furoate-vilanterol (BREO ELLIPTA) 200-25 MCG/INH AEPB Inhale 1 puff into the lungs daily. Patient not taking: Reported on 10/16/2020 09/10/20   Shelly Coss, MD  folic acid (FOLVITE) 1 MG tablet Take 1 tablet (1 mg total) by mouth daily. 09/10/20   Shelly Coss, MD  furosemide (LASIX) 40 MG tablet TAKE (1) TABLET TWICE DAILY. 01/08/21   Consuelo Pandy, PA-C  HYDROcodone-acetaminophen (NORCO/VICODIN) 5-325 MG tablet Take 1 tablet by mouth 3 (three) times daily as needed for pain. 06/12/16   [provider]  insulin detemir (LEVEMIR) 100 UNIT/ML injection Inject 40 Units into the skin at bedtime.    [provider]  metFORMIN (GLUCOPHAGE) 500 MG tablet Take 1,000 mg by mouth daily with breakfast.    [provider]  midodrine (PROAMATINE) 5 MG tablet Take 3 tablets (15 mg total) by mouth 3 (three) times daily with meals. 09/09/20   Shelly Coss, MD  Omega-3 Fatty Acids (FISH OIL) 1000 MG CAPS Take 1 capsule by mouth daily.    [provider]  pantoprazole (PROTONIX) 40 MG tablet Take 1 tablet (40 mg total) by mouth 2 (two) times daily. 09/09/20   Shelly Coss, MD  potassium chloride SA (KLOR-CON) 20 MEQ tablet Take 2 tablets (40 mEq total) by mouth daily. 09/10/20   Shelly Coss, MD  PROAIR HFA 108 [phone] Base) MCG/ACT inhaler Inhale 2 puffs into the lungs 4 (four) times daily as needed for shortness of breath. 06/28/16   [provider]  rosuvastatin (CRESTOR) 20 MG tablet Take 1 tablet (20 mg total) by mouth daily. 09/10/20   Shelly Coss, MD  sitaGLIPtin (JANUVIA) 100 MG tablet Take 100 mg by mouth daily.    [provider]   sucralfate (CARAFATE) 1 GM/10ML suspension Take 10 mLs (1 g total) by mouth 4 (four) times daily for 16 days. 09/09/20 09/25/20  Shelly Coss, MD  tamsulosin (FLOMAX) 0.4 MG CAPS capsule Take 0.8 mg by mouth every evening. 08/03/20   [provider]  thiamine 100 MG tablet Take 1 tablet (100 mg total) by mouth daily. 09/10/20   Shelly Coss, MD  umeclidinium bromide (INCRUSE ELLIPTA) 62.5 MCG/INH AEPB Inhale 1 puff into the lungs daily. 09/10/20   Shelly Coss, MD    Physical Exam: BP 115/81    Pulse (!) 110    Temp (!) 102.3 F (39.1 C) (Oral)    Resp (!) 24    Ht 5\' 8"  (1.727 m)    Wt 107.4 kg    SpO2 98%    BMI 36.01 kg/m   General: Elderly male. Awake and alert.  Patient is on oriented. No acute cardiopulmonary distress.  HEENT: Normocephalic atraumatic.  Right and left ears normal in appearance.  Pupils equal, round, reactive to light. Extraocular muscles are intact. Sclerae anicteric and noninjected.  Moist mucosal membranes. No mucosal lesions.  Neck: Neck supple without  lymphadenopathy. No carotid bruits. No masses palpated.  Cardiovascular: Regular rate with normal S1-S2 sounds. No murmurs, rubs, gallops auscultated. No JVD.  Respiratory: Good respiratory effort with no wheezes, rales, rhonchi. Lungs clear to auscultation bilaterally.  No accessory muscle use. Abdomen: Soft, nontender, nondistended. Active bowel sounds. No masses or hepatosplenomegaly  Skin: No rashes, lesions, or ulcerations.  Dry, warm to touch. 2+ dorsalis pedis and radial pulses. Musculoskeletal: No calf or leg pain. All major joints not erythematous nontender.  No upper or lower joint deformation.  Good ROM.  No contractures  Psychiatric: Lacks judgment and insight. Neurologic: Patient unable to follow exam, although no focal deficits observed other than mental status           Labs on Admission: I have personally reviewed following labs and imaging studies  CBC: Recent Labs  Lab  08/24/21 1602 08/24/21 1603  WBC 8.3  --   NEUTROABS 6.8  --   HGB 8.8* 13.9  HCT 33.4* 41.0  MCV 60.1*  --   PLT 222  --    Basic Metabolic Panel: Recent Labs  Lab 08/24/21 1602 08/24/21 1603  NA 134* 136  K 4.9 5.0  CL 97* 99  CO2 27  --   GLUCOSE 112* 106*  BUN 21 21  CREATININE 1.71* 1.50*  CALCIUM 8.2*  --    GFR: Estimated Creatinine Clearance: 52.9 mL/min (A) (by C-G formula based on SCr of 1.5 mg/dL (H)). Liver Function Tests: Recent Labs  Lab 08/24/21 1602  AST 53*  ALT 50*  ALKPHOS 107  BILITOT 1.9*  PROT 7.4  ALBUMIN 3.1*   No results for input(s): LIPASE, AMYLASE in the last 168 hours. Recent Labs  Lab 08/24/21 1849  AMMONIA 29   Coagulation Profile: Recent Labs  Lab 08/24/21 1602  INR 2.0*   Cardiac Enzymes: No results for input(s): CKTOTAL, CKMB, CKMBINDEX, TROPONINI in the last 168 hours. BNP (last 3 results) No results for input(s): PROBNP in the last 8760 hours. HbA1C: No results for input(s): HGBA1C in the last 72 hours. CBG: Recent Labs  Lab 08/24/21 1558  GLUCAP 112*   Lipid Profile: No results for input(s): CHOL, HDL, LDLCALC, TRIG, CHOLHDL, LDLDIRECT in the last 72 hours. Thyroid Function Tests: No results for input(s): TSH, T4TOTAL, FREET4, T3FREE, THYROIDAB in the last 72 hours. Anemia Panel: No results for input(s): VITAMINB12, FOLATE, FERRITIN, TIBC, IRON, RETICCTPCT in the last 72 hours. Urine analysis:    Component Value Date/Time   COLORURINE YELLOW 08/30/2020 [place] 08/30/2020 1445   LABSPEC 1.038 (H) 08/30/2020 1445   PHURINE 6.0 08/30/2020 1445   GLUCOSEU NEGATIVE 08/30/2020 1445   HGBUR SMALL (A) 08/30/2020 1445   BILIRUBINUR NEGATIVE 08/30/2020 1445   KETONESUR NEGATIVE 08/30/2020 1445   PROTEINUR NEGATIVE 08/30/2020 1445   NITRITE NEGATIVE 08/30/2020 1445   LEUKOCYTESUR NEGATIVE 08/30/2020 1445   Sepsis Labs: @LABRCNTIP (procalcitonin:4,lacticidven:4) ) Recent Results (from the  past 240 hour(s))  Blood Culture (routine x 2)     Status: None (Preliminary result)   Collection Time: 08/24/21  4:02 PM   Specimen: Right Antecubital; Blood  Result Value Ref Range Status   Specimen Description   Final    RIGHT ANTECUBITAL BOTTLES DRAWN AEROBIC AND ANAEROBIC   Special Requests   Final    Blood Culture adequate volume Performed at [facility], [address] [place], [place] [address]    Culture PENDING  Incomplete   Report Status PENDING  Incomplete  Blood Culture (  routine x 2)     Status: None (Preliminary result)   Collection Time: 08/24/21  4:07 PM   Specimen: Vein; Blood  Result Value Ref Range Status   Specimen Description   Final    BLOOD LEFT FOREARM BOTTLES DRAWN AEROBIC AND ANAEROBIC   Special Requests   Final    Blood Culture results may not be optimal due to an excessive volume of blood received in culture bottles Performed at [facility], [address], [place], [place] [address]    Culture PENDING  Incomplete   Report Status PENDING  Incomplete  Resp Panel by RT-PCR (Flu A&B, Covid) Nasopharyngeal Swab     Status: None   Collection Time: 08/24/21  5:52 PM   Specimen: Nasopharyngeal Swab; Nasopharyngeal(NP) swabs in vial transport medium  Result Value Ref Range Status   SARS Coronavirus 2 by RT PCR NEGATIVE NEGATIVE Final    Comment: (NOTE) SARS-CoV-2 target nucleic acids are NOT DETECTED.  The SARS-CoV-2 RNA is generally detectable in upper respiratory specimens during the acute phase of infection. The lowest concentration of SARS-CoV-2 viral copies this assay can detect is 138 copies/mL. A negative result does not preclude SARS-Cov-2 infection and should not be used as the sole basis for treatment or other patient management decisions. A negative result may occur with  improper specimen collection/handling, submission of specimen other than nasopharyngeal swab, presence of viral mutation(s) within the areas targeted by this assay, and  inadequate number of viral copies(<138 copies/mL). A negative result must be combined with clinical observations, patient history, and epidemiological information. The expected result is Negative.  Fact Sheet for Patients:  EntrepreneurPulse.com.au  Fact Sheet for Healthcare Providers:  IncredibleEmployment.be  This test is no t yet approved or cleared by the [place] FDA and  has been authorized for detection and/or diagnosis of SARS-CoV-2 by FDA under an Emergency Use Authorization (EUA). This EUA will remain  in effect (meaning this test can be used) for the duration of the COVID-19 declaration under Section 564(b)(1) of the Act, 21 U.S.C.section 360bbb-3(b)(1), unless the authorization is terminated  or revoked sooner.       Influenza A by PCR NEGATIVE NEGATIVE Final   Influenza B by PCR NEGATIVE NEGATIVE Final    Comment: (NOTE) The Xpert Xpress SARS-CoV-2/FLU/RSV plus assay is intended as an aid in the diagnosis of influenza from Nasopharyngeal swab specimens and should not be used as a sole basis for treatment. Nasal washings and aspirates are unacceptable for Xpert Xpress SARS-CoV-2/FLU/RSV testing.  Fact Sheet for Patients: EntrepreneurPulse.com.au  Fact Sheet for Healthcare Providers: IncredibleEmployment.be  This test is not yet approved or cleared by the [place] FDA and has been authorized for detection and/or diagnosis of SARS-CoV-2 by FDA under an Emergency Use Authorization (EUA). This EUA will remain in effect (meaning this test can be used) for the duration of the COVID-19 declaration under Section 564(b)(1) of the Act, 21 U.S.C. section 360bbb-3(b)(1), unless the authorization is terminated or revoked.  Performed at [facility], [address], [place],  [address]      Radiological Exams on Admission: CT CHEST ABDOMEN PELVIS W CONTRAST  Result Date:  08/24/2021 CLINICAL DATA:  Sepsis, worsening mental status, confusion EXAM: CT CHEST, ABDOMEN, AND PELVIS WITH CONTRAST TECHNIQUE: Multidetector CT imaging of the chest, abdomen and pelvis was performed following the standard protocol during bolus administration of intravenous contrast. RADIATION DOSE REDUCTION: This exam was performed according to the departmental dose-optimization program which includes automated exposure control,  adjustment of the mA and/or kV according to patient size and/or use of iterative reconstruction technique. CONTRAST:  58mL OMNIPAQUE IOHEXOL 300 MG/ML  SOLN COMPARISON:  08/24/2021, 08/30/2020 FINDINGS: CT CHEST FINDINGS Cardiovascular: The heart is enlarged, with trace pericardial effusion. There is marked atherosclerosis of the coronary vasculature. No evidence of thoracic aortic aneurysm or dissection. Moderate atherosclerosis of the aortic arch. Mediastinum/Nodes: Precarinal adenopathy measuring up to 17 mm in short axis. Subcentimeter lymph nodes are seen elsewhere within the mediastinum. No hilar or axillary adenopathy. Thyroid, trachea, and esophagus are grossly unremarkable. Lungs/Pleura: There are small bilateral pleural effusions, right greater than left. No acute airspace disease. No pneumothorax. Central airways are patent. Musculoskeletal: No acute or destructive bony lesions. Reconstructed images demonstrate no additional findings. CT ABDOMEN PELVIS FINDINGS Hepatobiliary: No focal liver abnormality is seen. No gallstones, gallbladder wall thickening, or biliary dilatation. Pancreas: Unremarkable. No pancreatic ductal dilatation or surrounding inflammatory changes. Spleen: Normal in size without focal abnormality. Adrenals/Urinary Tract: Adrenal glands are unremarkable. Kidneys are normal, without renal calculi, focal lesion, or hydronephrosis. Bladder is unremarkable. Stomach/Bowel: No bowel obstruction or ileus. Normal appendix right lower quadrant. No bowel wall  thickening or inflammatory change. Vascular/Lymphatic: Aortic atherosclerosis. No enlarged abdominal or pelvic lymph nodes. Reproductive: Prostate is unremarkable. Other: Trace ascites most pronounced in the upper abdomen. No free intraperitoneal gas. No abdominal wall hernia. Musculoskeletal: No acute or destructive bony lesions. Reconstructed images demonstrate no additional findings. IMPRESSION: Chest: 1. Cardiomegaly, with trace pericardial effusion. 2. Small bilateral pleural effusions, right greater the left. 3. Mediastinal lymphadenopathy. 4. Aortic Atherosclerosis (ICD10-I70.0). Coronary artery atherosclerosis. Abdomen/pelvis: 1. Trace ascites. 2. Otherwise no acute intra-abdominal or intrapelvic process. 3.  Aortic Atherosclerosis (ICD10-I70.0). Electronically Signed   By: Randa Ngo M.D.   On: 08/24/2021 17:49   DG Chest Port 1 View  Result Date: 08/24/2021 CLINICAL DATA:  Possible sepsis.  Altered mental status. EXAM: PORTABLE CHEST 1 VIEW COMPARISON:  09/05/2020 FINDINGS: Marked enlargement of the cardiopericardial silhouette, increased from prior. Pulmonary vascular congestion with diffuse bilateral interstitial prominence. Aortic atherosclerosis. No pleural effusion or pneumothorax. IMPRESSION: 1. Marked enlargement of the cardiopericardial silhouette, increased from prior. Pericardial effusion not excluded. 2. Pulmonary vascular congestion with diffuse bilateral interstitial prominence, likely representing mild edema. Electronically Signed   By: Davina Poke D.O.   On: 08/24/2021 16:30    EKG: Independently reviewed.  Pending  Assessment/Plan: Active Problems:   Essential hypertension   COPD (chronic obstructive pulmonary disease) (HCC)   Diabetes mellitus (HCC)   PAF (paroxysmal atrial fibrillation) (HCC)   Sepsis (HCC)   Elevated LFTs   AKI (acute kidney injury) ([place])   Acute metabolic encephalopathy    This patient was discussed with the ED physician, including pertinent  vitals, physical exam findings, labs, and imaging.  We also discussed care given by the ED provider.  Sepsis - unknown source Continue broad-spectrum antibiotics: Vancomycin and cefepime. Blood cultures, urine culture pending Repeat lactic acid now and in 3 hours Will give another fluid bolus and continue maintenance fluids. Recheck CBC in the morning Metabolic encephalopathy Likely secondary to infection.  We will watch for clearing it has sepsis status improves.  If it does not appear to continue to improve, will need further work-up such as brain imaging. AKI Likely secondary due to sepsis and dehydration, especially poor p.o. intake over the past couple of days. Rehydrate.  Recheck creatinine in the morning Elevated LFTs Likely secondary to dehydration. Will repeat in the morning.  If still  elevated, will need further work-up and perhaps ultrasound PAF Rate controlled Continue digoxin Will check dig level Diabetes Will give half his long-acting dose.  Hold oral hypoglycemics. Sliding scale insulin every 4 hours.  CBGs every 4 hours  DVT prophylaxis: On Xarelto Consultants: None Code Status: Presumed full code at this point.  Patient's family to discuss to see whether they would like to change CODE STATUS Family Communication: Patient's daughter and granddaughter Disposition Plan: Pending   Truett Mainland, DO

## 2021-08-24 NOTE — Progress Notes (Addendum)
Elink following for sepsis protocol. Bedside RN endorses that fluid resuscitation to be judicious r/t FVO/CHF hx.

## 2021-08-24 NOTE — ED Notes (Signed)
Pt to CT

## 2021-08-24 NOTE — ED Triage Notes (Signed)
Pt arrived via [facility] for c/o worsening AMS x 1 day.Pt had went to urgent care for an abscessed tooth and received antibiotics--However he did not complete it due to GI upset

## 2021-08-24 NOTE — Progress Notes (Addendum)
Patient ID: Perry Hunter, male   DOB: 01/05/1951, 71 y.o.   MRN: [id number]  Called to patient's room in ED - BP dropping, more somnolent and not responding.  Levophed started in peripheral IV. EDP asked to place central line. As patient having snoring respirations, will place on BiPAP as patient has OSA. Additionally, A fib with RVR. Will start cardizem.  Truett Mainland, DO

## 2021-08-24 NOTE — ED Notes (Signed)
Date and time results received: 08/24/21 2246 (use smartphrase ".now" to insert current time)  Test: ABG Critical Value: pH 7.168, PCO2 74.8  Name of Provider Notified: Ruthe Mannan and Kommor, MD

## 2021-08-24 NOTE — ED Notes (Signed)
Pt back from CT

## 2021-08-24 NOTE — ED Notes (Signed)
Both sets of cultures drawn before antibiotic administration  

## 2021-08-24 NOTE — Progress Notes (Signed)
Pharmacy Antibiotic Note  Perry Hunter a 71 y.o. male admitted on 08/24/2021 with sepsis.  Pharmacy has been consulted for vancomycin and cefepime dosing.  Plan: Vancomycin 1250mg  IV every 24 hours.  Goal trough 15-20 mcg/mL. Cefepime 2gm IV every 12 hours.  Medical History: Past Medical History:  Diagnosis Date   Arthritis    Asthma    CHF (congestive heart failure) (HCC)    Diabetes mellitus without complication (HCC)    GERD (gastroesophageal reflux disease)    Hypercholesteremia    Hypertension     Allergies:  No Known Allergies  Filed Weights   08/24/21 1638  Weight: 107.4 kg (236 lb 12.8 oz)    CBC Latest Ref Rng & Units 08/24/2021 10/04/2020 09/09/2020  WBC 4.0 - 10.5 K/uL - 8.4 6.3  Hemoglobin 13.0 - 17.0 g/dL 13.9 9.8(L) 8.4(L)  Hematocrit 39.0 - 52.0 % 41.0 33.6(L) 30.8(L)  Platelets 150 - 400 K/uL - 466(H) 264     Estimated Creatinine Clearance: 52.9 mL/min (A) (by C-G formula based on SCr of 1.5 mg/dL (H)).  Antibiotics Given (last 72 hours)     Date/Time Action Medication Dose Rate   08/24/21 1614 New Bag/Given   ceFEPIme (MAXIPIME) 2 g in sodium chloride 0.9 % 100 mL IVPB 2 g 200 mL/hr   08/24/21 1628 New Bag/Given   metroNIDAZOLE (FLAGYL) IVPB 500 mg 500 mg 100 mL/hr       Antimicrobials this admission:  Cefepime 08/24/2021  >>  vancomycin 08/24/2021  >>  Metronidazole 08/24/2021   x 1   Microbiology results: 08/24/2021  BCx: sent 08/24/2021  UCx: sent 08/24/2021  Resp Panel: sent  08/24/2021  MRSA PCR: sent  Thank you for allowing pharmacy to be a part of this patients care.  Thomasenia Sales, PharmD Clinical Pharmacist

## 2021-08-24 NOTE — ED Provider Notes (Signed)
[facility] Provider Note  CSN: [id number] Arrival date & time: 08/24/21 1543  Chief Complaint(s) No chief complaint on file.  HPI Perry Hunter is a 72 y.o. male with PMH asthma, CHF with last EF 35%, persistent A. fib on Eliquis, HLD, HTN, T2DM who presents emergency department for evaluation of shortness of breath, fever and altered mental status.  History obtained from patient's daughter as patient is currently altered.  He was recently seen in urgent care for a dental infection and was prescribed antibiotics but the patient self discontinued these antibiotics due to persistent diarrhea.  On chart review of this dental infection, it appears that urgent care staff believe the patient had a cellulitis of the lip and mucosal tissues of the lip and discharged the patient on Augmentin.  On evaluation of the patient's lip, the cellulitis in question is an aphthous ulcer.  Patient arrives hypoxic to 85% on room air and he was increased to 4 L nasal cannula.  Patient also arrives hypotensive with initial pressures in the 60s.  Patient denies abdominal pain, chest pain, shortness of breath, headache, fever or other systemic symptoms.  Daughter has also not noticed any additional symptoms other than lower extremity edema.  HPI  Past Medical History Past Medical History:  Diagnosis Date   Arthritis    Asthma    CHF (congestive heart failure) ([place])    Diabetes mellitus without complication (HCC)    GERD (gastroesophageal reflux disease)    Hypercholesteremia    Hypertension    Patient Active Problem List   Diagnosis Date Noted   Persistent atrial fibrillation ([place])    PAF (paroxysmal atrial fibrillation) ([place])    Acute on chronic combined systolic and diastolic CHF (congestive heart failure) ([place])    Cardiogenic shock ([place]) 08/26/2020   GI bleed 08/25/2020   Symptomatic anemia 08/25/2020   Microcytic anemia 08/25/2020   Elevated brain natriuretic peptide (BNP) level  08/25/2020   Hypotension 08/25/2020   Hyponatremia 08/25/2020   Chronic kidney disease 08/25/2020   Essential hypertension 08/25/2020   Hyperlipidemia 08/25/2020   GERD (gastroesophageal reflux disease) 08/25/2020   COPD (chronic obstructive pulmonary disease) ([place]) 08/25/2020   Diabetes mellitus ([place]) 08/25/2020   Obesity 08/25/2020   Home Medication(s) Prior to Admission medications   Medication Sig Start Date End Date Taking? Authorizing Provider  acetaminophen (TYLENOL) 650 MG CR tablet Take 1,300 mg by mouth daily.    [provider]  amoxicillin-clavulanate (AUGMENTIN) 875-125 MG tablet Take 1 tablet by mouth 2 (two) times daily. 08/18/21   Jaynee Eagles, PA-C  Cholecalciferol (VITAMIN D3) 1000 units CAPS Take 1 capsule by mouth daily.    [provider]  digoxin 62.5 MCG TABS Take 0.0625 mg by mouth daily. 09/10/20   Shelly Coss, MD  ELIQUIS 5 MG TABS tablet Take 5 mg by mouth 2 (two) times daily. 08/16/20   [provider]  ferrous sulfate 325 (65 FE) MG tablet Take 1 tablet (325 mg total) by mouth daily. 09/09/20 09/09/21  Shelly Coss, MD  finasteride (PROSCAR) 5 MG tablet Take 1 tablet (5 mg total) by mouth daily. 10/16/20   Festus Aloe, MD  fluticasone furoate-vilanterol (BREO ELLIPTA) 200-25 MCG/INH AEPB Inhale 1 puff into the lungs daily. Patient not taking: Reported on 10/16/2020 09/10/20   Shelly Coss, MD  folic acid (FOLVITE) 1 MG tablet Take 1 tablet (1 mg total) by mouth daily. 09/10/20   Shelly Coss, MD  furosemide (LASIX) 40 MG tablet TAKE (1) TABLET  TWICE DAILY. 01/08/21   Consuelo Pandy, PA-C  HYDROcodone-acetaminophen (NORCO/VICODIN) 5-325 MG tablet Take 1 tablet by mouth 3 (three) times daily as needed for pain. 06/12/16   [provider]  insulin detemir (LEVEMIR) 100 UNIT/ML injection Inject 40 Units into the skin at bedtime.    [provider]  metFORMIN (GLUCOPHAGE) 500 MG tablet Take 1,000 mg by mouth  daily with breakfast.    [provider]  midodrine (PROAMATINE) 5 MG tablet Take 3 tablets (15 mg total) by mouth 3 (three) times daily with meals. 09/09/20   Shelly Coss, MD  Omega-3 Fatty Acids (FISH OIL) 1000 MG CAPS Take 1 capsule by mouth daily.    [provider]  pantoprazole (PROTONIX) 40 MG tablet Take 1 tablet (40 mg total) by mouth 2 (two) times daily. 09/09/20   Shelly Coss, MD  potassium chloride SA (KLOR-CON) 20 MEQ tablet Take 2 tablets (40 mEq total) by mouth daily. 09/10/20   Shelly Coss, MD  PROAIR HFA 108 [phone] Base) MCG/ACT inhaler Inhale 2 puffs into the lungs 4 (four) times daily as needed for shortness of breath. 06/28/16   [provider]  rosuvastatin (CRESTOR) 20 MG tablet Take 1 tablet (20 mg total) by mouth daily. 09/10/20   Shelly Coss, MD  sitaGLIPtin (JANUVIA) 100 MG tablet Take 100 mg by mouth daily.    [provider]  sucralfate (CARAFATE) 1 GM/10ML suspension Take 10 mLs (1 g total) by mouth 4 (four) times daily for 16 days. 09/09/20 09/25/20  Shelly Coss, MD  tamsulosin (FLOMAX) 0.4 MG CAPS capsule Take 0.8 mg by mouth every evening. 08/03/20   [provider]  thiamine 100 MG tablet Take 1 tablet (100 mg total) by mouth daily. 09/10/20   Shelly Coss, MD  umeclidinium bromide (INCRUSE ELLIPTA) 62.5 MCG/INH AEPB Inhale 1 puff into the lungs daily. 09/10/20   Shelly Coss, MD                                                                                                                                    Past Surgical History Past Surgical History:  Procedure Laterality Date   BIOPSY  08/29/2020   Procedure: BIOPSY;  Surgeon: Rush Landmark Telford Nab., MD;  Location: [facility];  Service: Gastroenterology;;   CARDIOVERSION N/A 09/07/2020   Procedure: CARDIOVERSION;  Surgeon: Jolaine Artist, MD;  Location: [facility] ENDOSCOPY;  Service: Cardiovascular;  Laterality: N/A;   ESOPHAGOGASTRODUODENOSCOPY (EGD) WITH  PROPOFOL N/A 08/29/2020   Procedure: ESOPHAGOGASTRODUODENOSCOPY (EGD) WITH PROPOFOL;  Surgeon: Irving Copas., MD;  Location: [facility];  Service: Gastroenterology;  Laterality: N/A;   RIGHT/LEFT HEART CATH AND CORONARY ANGIOGRAPHY N/A 08/31/2020   Procedure: RIGHT/LEFT HEART CATH AND CORONARY ANGIOGRAPHY;  Surgeon: Jolaine Artist, MD;  Location: [facility];  Service: Cardiovascular;  Laterality: N/A;   TEE WITHOUT CARDIOVERSION N/A 09/07/2020   Procedure: TRANSESOPHAGEAL ECHOCARDIOGRAM (TEE);  Surgeon: Haroldine Laws,  Shaune Pascal, MD;  Location: [facility] ENDOSCOPY;  Service: Cardiovascular;  Laterality: N/A;   Family History No family history on file.  Social History Social History   Tobacco Use   Smoking status: Every Day    Types: Cigarettes   Smokeless tobacco: Never  Substance Use Topics   Alcohol use: Not Currently   Drug use: Never   Allergies Patient has no known allergies.  Review of Systems Review of Systems  Unable to perform ROS: Mental status change   Physical Exam Vital Signs  I have reviewed the triage vital signs BP (!) 131/114 (BP Location: Left Arm)    Pulse (!) 128    Temp (!) 102.4 F (39.1 C) (Oral)    Resp (!) 21    SpO2 100%   Physical Exam Vitals and nursing note reviewed.  Constitutional:      General: He is not in acute distress.    Appearance: He is well-developed. He is ill-appearing.  HENT:     Head: Normocephalic and atraumatic.  Eyes:     Conjunctiva/sclera: Conjunctivae normal.  Cardiovascular:     Rate and Rhythm: Tachycardia present. Rhythm irregular.     Heart sounds: No murmur heard. Pulmonary:     Effort: Pulmonary effort is normal. No respiratory distress.     Breath sounds: Normal breath sounds.  Abdominal:     Palpations: Abdomen is soft.     Tenderness: There is no abdominal tenderness.  Musculoskeletal:        General: No swelling.     Cervical back: Neck supple.     Right lower leg: Edema present.     Left lower  leg: Edema present.  Skin:    General: Skin is warm and dry.     Capillary Refill: Capillary refill takes less than 2 seconds.  Neurological:     Mental Status: He is alert.  Psychiatric:        Mood and Affect: Mood normal.    ED Results and Treatments Labs (all labs ordered are listed, but only abnormal results are displayed) Labs Reviewed  COMPREHENSIVE METABOLIC PANEL - Abnormal; Notable for the following components:      Result Value   Sodium 134 (*)    Chloride 97 (*)    Glucose, Bld 112 (*)    Creatinine, Ser 1.71 (*)    Calcium 8.2 (*)    Albumin 3.1 (*)    AST 53 (*)    ALT 50 (*)    Total Bilirubin 1.9 (*)    GFR, Estimated 42 (*)    All other components within normal limits  PROTIME-INR - Abnormal; Notable for the following components:   Prothrombin Time 22.7 (*)    INR 2.0 (*)    All other components within normal limits  CBG MONITORING, ED - Abnormal; Notable for the following components:   Glucose-Capillary 112 (*)    All other components within normal limits  I-STAT CHEM 8, ED - Abnormal; Notable for the following components:   Creatinine, Ser 1.50 (*)    Glucose, Bld 106 (*)    Calcium, Ion 1.06 (*)    All other components within normal limits  CULTURE, BLOOD (ROUTINE X 2)  RESP PANEL BY RT-PCR (FLU A&B, COVID) ARPGX2  CULTURE, BLOOD (ROUTINE X 2)  URINE CULTURE  APTT  LACTIC ACID, PLASMA  LACTIC ACID, PLASMA  CBC WITH DIFFERENTIAL/PLATELET  URINALYSIS, ROUTINE W REFLEX MICROSCOPIC  Radiology DG Chest Port 1 View  Result Date: 08/24/2021 CLINICAL DATA:  Possible sepsis.  Altered mental status. EXAM: PORTABLE CHEST 1 VIEW COMPARISON:  09/05/2020 FINDINGS: Marked enlargement of the cardiopericardial silhouette, increased from prior. Pulmonary vascular congestion with diffuse bilateral interstitial prominence. Aortic atherosclerosis.  No pleural effusion or pneumothorax. IMPRESSION: 1. Marked enlargement of the cardiopericardial silhouette, increased from prior. Pericardial effusion not excluded. 2. Pulmonary vascular congestion with diffuse bilateral interstitial prominence, likely representing mild edema. Electronically Signed   By: Davina Poke D.O.   On: 08/24/2021 16:30    Pertinent labs & imaging results that were available during my care of the patient were reviewed by me and considered in my medical decision making (see MDM for details).  Medications Ordered in ED Medications  lactated ringers infusion (has no administration in time range)  ceFEPIme (MAXIPIME) 2 g in sodium chloride 0.9 % 100 mL IVPB (2 g Intravenous New Bag/Given 08/24/21 1614)  metroNIDAZOLE (FLAGYL) IVPB 500 mg (500 mg Intravenous New Bag/Given 08/24/21 1628)  vancomycin (VANCOCIN) IVPB 1000 mg/200 mL premix (has no administration in time range)  lactated ringers bolus 1,000 mL (has no administration in time range)  acetaminophen (TYLENOL) tablet 1,000 mg (1,000 mg Oral Given 08/24/21 1609)                                                                                                                                     Procedures .Critical Care Performed by: Teressa Lower, MD Authorized by: Teressa Lower, MD   Critical care provider statement:    Critical care time (minutes):  75   Critical care was time spent personally by me on the following activities:  Development of treatment plan with patient or surrogate, discussions with consultants, evaluation of patient's response to treatment, examination of patient, ordering and review of laboratory studies, ordering and review of radiographic studies, ordering and performing treatments and interventions, pulse oximetry, re-evaluation of patient's condition and review of old charts .Central Line  Date/Time: 08/25/2021 12:06 AM Performed by: Teressa Lower, MD Authorized by: Teressa Lower,  MD   Consent:    Consent obtained:  Emergent situation Pre-procedure details:    Indication(s): central venous access     Skin preparation:  Chlorhexidine Sedation:    Sedation type:  None Anesthesia:    Anesthesia method:  None Procedure details:    Location:  L internal jugular   Procedural supplies:  Triple lumen   Number of attempts:  3   Successful placement: yes   Post-procedure details:    Post-procedure:  Dressing applied and line sutured   Assessment:  Blood return through all ports, placement verified by x-ray and no pneumothorax on x-ray   Procedure completion:  Tolerated well, no immediate complications Comments:     Initial site selection of right femoral vein with intraluminal obstruction and inability to pass a wire and thus the site was aborted and the  left fem was attempted with similar obstruction.  Left IJ then proceeded with successful placement  (including critical care time)  Medical Decision Making / ED Course   This patient presents to the ED for concern of shortness of breath and fever, this involves an extensive number of treatment options, and is a complaint that carries with it a high risk of complications and morbidity.  The differential diagnosis includes sepsis, intra-abdominal infection, pneumonia, urinary tract infection, encephalopathy  MDM: Patient seen the emergency department for evaluation of multiple complaints described above.  Initial presentation, patient critically ill with hypotension and tachycardia.  Sepsis alert called and patient begun on broad-spectrum antibiotics.  Due to patient's EF of 35% and external evidence of fluid overload, we attempted to treat via ideal body weight and not pursue 30 cc/kg.  Patient received a total of 2 L of fluid consistent with his weight.  Initial lactate 7.3 and now downtrending to 4.4.  Initial CBC with no leukocytosis but hemoglobin of 8.8 with an MCV of 60.1, creatinine elevated to 1.5, INR elevated to  2.0.  Chest x-ray with enlarged cardiac silhouette with possibility of pericardial effusion.  Bedside ultrasound revealing no pericardial effusion or minimal at best.  CT chest abdomen pelvis with no acute source of infection, small pleural effusions and minimal pericardial effusion.  Patient was then admitted to the stepdown unit.  While boarding in the emergency department, patient's clinical status deteriorated and I worked alongside the hospitalist to resuscitate the patient and a central line was placed in the IJ after 2 failed femoral attempts.  IJ confirmed via chest x-ray.  Patient started on Levophed and admitted to the ICU.   Additional history obtained: -Additional history obtained from daughter -External records from outside source obtained and reviewed including: Chart review including previous notes, labs, imaging, consultation notes   Lab Tests: -I ordered, reviewed, and interpreted labs.   The pertinent results include:   Labs Reviewed  COMPREHENSIVE METABOLIC PANEL - Abnormal; Notable for the following components:      Result Value   Sodium 134 (*)    Chloride 97 (*)    Glucose, Bld 112 (*)    Creatinine, Ser 1.71 (*)    Calcium 8.2 (*)    Albumin 3.1 (*)    AST 53 (*)    ALT 50 (*)    Total Bilirubin 1.9 (*)    GFR, Estimated 42 (*)    All other components within normal limits  PROTIME-INR - Abnormal; Notable for the following components:   Prothrombin Time 22.7 (*)    INR 2.0 (*)    All other components within normal limits  CBG MONITORING, ED - Abnormal; Notable for the following components:   Glucose-Capillary 112 (*)    All other components within normal limits  I-STAT CHEM 8, ED - Abnormal; Notable for the following components:   Creatinine, Ser 1.50 (*)    Glucose, Bld 106 (*)    Calcium, Ion 1.06 (*)    All other components within normal limits  CULTURE, BLOOD (ROUTINE X 2)  RESP PANEL BY RT-PCR (FLU A&B, COVID) ARPGX2  CULTURE, BLOOD (ROUTINE X 2)   URINE CULTURE  APTT  LACTIC ACID, PLASMA  LACTIC ACID, PLASMA  CBC WITH DIFFERENTIAL/PLATELET  URINALYSIS, ROUTINE W REFLEX MICROSCOPIC      EKG   EKG Interpretation  Date/Time:    Ventricular Rate:    PR Interval:    QRS Duration:   QT Interval:  QTC Calculation:   R Axis:     Text Interpretation:           Imaging Studies ordered: I ordered imaging studies including CXR, CTCAP I independently visualized and interpreted imaging. I agree with the radiologist interpretation   Medicines ordered and prescription drug management: Meds ordered this encounter  Medications   lactated ringers infusion   DISCONTD: lactated ringers bolus 2,000 mL    Order Specific Question:   Reason 30 mL/kg dose is not being ordered    Answer:   Other (see comment below)    Order Specific Question:   Comment    Answer:   ideal body weight   ceFEPIme (MAXIPIME) 2 g in sodium chloride 0.9 % 100 mL IVPB    Order Specific Question:   Antibiotic Indication:    Answer:   Other Indication (list below)    Order Specific Question:   Other Indication:    Answer:   Unknown source   metroNIDAZOLE (FLAGYL) IVPB 500 mg    Order Specific Question:   Antibiotic Indication:    Answer:   Other Indication (list below)    Order Specific Question:   Other Indication:    Answer:   Unknown source   vancomycin (VANCOCIN) IVPB 1000 mg/200 mL premix    Order Specific Question:   Indication:    Answer:   Other Indication (list below)    Order Specific Question:   Other Indication:    Answer:   Unknown source   acetaminophen (TYLENOL) tablet 1,000 mg   lactated ringers bolus 1,000 mL    -I have reviewed the patients home medicines and have made adjustments as needed  Critical interventions Central line placement, multiple antibiotics, fluid resuscitation   Cardiac Monitoring: The patient was maintained on a cardiac monitor.  I personally viewed and interpreted the cardiac monitored which showed an  underlying rhythm of: afib with RVR  Social Determinants of Health:  Factors impacting patients care include: none   Reevaluation: After the interventions noted above, I reevaluated the patient and found that they have :stayed the same and ultimaey worsened promptin upgrade to ICU admission   Co morbidities that complicate the patient evaluation  Past Medical History:  Diagnosis Date   Arthritis    Asthma    CHF (congestive heart failure) ([place])    Diabetes mellitus without complication ([place])    GERD (gastroesophageal reflux disease)    Hypercholesteremia    Hypertension       Dispostion: I considered admission for this patient, and due to critical illness and concern for sepsis patient admitted     Final Clinical Impression(s) / ED Diagnoses Final diagnoses:  None     @PCDICTATION @    Teressa Lower, MD 08/25/21 [phone]

## 2021-08-25 ENCOUNTER — Other Ambulatory Visit: Payer: Self-pay

## 2021-08-25 ENCOUNTER — Inpatient Hospital Stay (HOSPITAL_COMMUNITY): Payer: Medicare Other

## 2021-08-25 LAB — BLOOD GAS, ARTERIAL
Acid-Base Excess: 2 mmol/L (ref 0.0–2.0)
Bicarbonate: 25.6 mmol/L (ref 20.0–28.0)
Drawn by: 22223
FIO2: 35
O2 Saturation: 97.7 %
Patient temperature: 37
pCO2 arterial: 63 mmHg — ABNORMAL HIGH (ref 32.0–48.0)
pH, Arterial: 7.273 — ABNORMAL LOW (ref 7.350–7.450)
pO2, Arterial: 110 mmHg — ABNORMAL HIGH (ref 83.0–108.0)

## 2021-08-25 LAB — CBC
HCT: 29.4 % — ABNORMAL LOW (ref 39.0–52.0)
Hemoglobin: 7.9 g/dL — ABNORMAL LOW (ref 13.0–17.0)
MCH: 16.1 pg — ABNORMAL LOW (ref 26.0–34.0)
MCHC: 26.9 g/dL — ABNORMAL LOW (ref 30.0–36.0)
MCV: 60 fL — ABNORMAL LOW (ref 80.0–100.0)
Platelets: 198 10*3/uL (ref 150–400)
RBC: 4.9 MIL/uL (ref 4.22–5.81)
RDW: 22.6 % — ABNORMAL HIGH (ref 11.5–15.5)
WBC: 6.9 10*3/uL (ref 4.0–10.5)
nRBC: 1 % — ABNORMAL HIGH (ref 0.0–0.2)

## 2021-08-25 LAB — URINALYSIS, ROUTINE W REFLEX MICROSCOPIC
Glucose, UA: NEGATIVE mg/dL
Hgb urine dipstick: NEGATIVE
Ketones, ur: NEGATIVE mg/dL
Leukocytes,Ua: NEGATIVE
Nitrite: NEGATIVE
Protein, ur: 30 mg/dL — AB
Specific Gravity, Urine: 1.025 (ref 1.005–1.030)
pH: 5.5 (ref 5.0–8.0)

## 2021-08-25 LAB — LACTIC ACID, PLASMA
Lactic Acid, Venous: 1.4 mmol/L (ref 0.5–1.9)
Lactic Acid, Venous: 2.5 mmol/L (ref 0.5–1.9)

## 2021-08-25 LAB — GLUCOSE, CAPILLARY
Glucose-Capillary: 123 mg/dL — ABNORMAL HIGH (ref 70–99)
Glucose-Capillary: 80 mg/dL (ref 70–99)
Glucose-Capillary: 57 mg/dL — ABNORMAL LOW (ref 70–99)
Glucose-Capillary: 59 mg/dL — ABNORMAL LOW (ref 70–99)
Glucose-Capillary: 70 mg/dL (ref 70–99)

## 2021-08-25 LAB — URINALYSIS, MICROSCOPIC (REFLEX): Squamous Epithelial / HPF: NONE SEEN (ref 0–5)

## 2021-08-25 LAB — COMPREHENSIVE METABOLIC PANEL
ALT: 73 U/L — ABNORMAL HIGH (ref 0–44)
AST: 124 U/L — ABNORMAL HIGH (ref 15–41)
Albumin: 2.8 g/dL — ABNORMAL LOW (ref 3.5–5.0)
Alkaline Phosphatase: 95 U/L (ref 38–126)
Anion gap: 6 (ref 5–15)
BUN: 26 mg/dL — ABNORMAL HIGH (ref 8–23)
CO2: 27 mmol/L (ref 22–32)
Calcium: 7.7 mg/dL — ABNORMAL LOW (ref 8.9–10.3)
Chloride: 100 mmol/L (ref 98–111)
Creatinine, Ser: 1.77 mg/dL — ABNORMAL HIGH (ref 0.61–1.24)
GFR, Estimated: 40 mL/min — ABNORMAL LOW (ref >60)
Glucose, Bld: 133 mg/dL — ABNORMAL HIGH (ref 70–99)
Potassium: 4.6 mmol/L (ref 3.5–5.1)
Sodium: 133 mmol/L — ABNORMAL LOW (ref 135–145)
Total Bilirubin: 1.6 mg/dL — ABNORMAL HIGH (ref 0.3–1.2)
Total Protein: 6.4 g/dL — ABNORMAL LOW (ref 6.5–8.1)

## 2021-08-25 LAB — HEMOGLOBIN A1C
Hgb A1c MFr Bld: 6.3 % — ABNORMAL HIGH (ref 4.8–5.6)
Mean Plasma Glucose: 134.11 mg/dL

## 2021-08-25 LAB — PROCALCITONIN
Procalcitonin: 0.27 ng/mL
Procalcitonin: 0.28 ng/mL

## 2021-08-25 LAB — PROTIME-INR
INR: 2 — ABNORMAL HIGH (ref 0.8–1.2)
Prothrombin Time: 22.8 seconds — ABNORMAL HIGH (ref 11.4–15.2)

## 2021-08-25 LAB — MRSA NEXT GEN BY PCR, NASAL: MRSA by PCR Next Gen: DETECTED — AB

## 2021-08-25 LAB — TSH: TSH: 1.38 u[IU]/mL (ref 0.350–4.500)

## 2021-08-25 MED ORDER — METRONIDAZOLE 500 MG/100ML IV SOLN
500.0000 mg | Freq: Two times a day (BID) | INTRAVENOUS | Status: DC
  Administered 2021-08-25 – 2021-08-28 (×7): 500 mg via INTRAVENOUS
  Filled 2021-08-25 (×7): qty 100

## 2021-08-25 MED ORDER — MUPIROCIN 2 % EX OINT
1.0000 "application " | TOPICAL_OINTMENT | Freq: Two times a day (BID) | CUTANEOUS | Status: AC
  Administered 2021-08-25 – 2021-08-29 (×10): 1 via NASAL
  Filled 2021-08-25: qty 22

## 2021-08-25 MED ORDER — ENOXAPARIN SODIUM 100 MG/ML IJ SOSY
100.0000 mg | PREFILLED_SYRINGE | Freq: Two times a day (BID) | INTRAMUSCULAR | Status: DC
  Administered 2021-08-25 – 2021-08-31 (×13): 100 mg via SUBCUTANEOUS
  Filled 2021-08-25 (×13): qty 1

## 2021-08-25 NOTE — Progress Notes (Signed)
PROGRESS NOTE    Perry Hunter  WUJ:[id number] DOB: 20-Oct-1950 DOA: 08/24/2021 PCP: Neale Burly, MD   Brief Narrative:   Perry Hunter is a 71 y.o. male with a history of diabetes, CHF, hypertension, COPD, paroxysmal atrial fibrillation on anticoagulation, BPH, obstructive sleep apnea not on CPAP.  Patient brought to the hospital for increasing confusion, shortness of breath, fever.  Patient was admitted with septic shock with unknown source with associated metabolic encephalopathy and AKI.  Assessment & Plan:   Principal Problem:   Sepsis ([place]) Active Problems:   Essential hypertension   COPD (chronic obstructive pulmonary disease) (HCC)   Diabetes mellitus (HCC)   PAF (paroxysmal atrial fibrillation) (HCC)   Elevated LFTs   AKI (acute kidney injury) ([place])   Acute metabolic encephalopathy   Septic shock present on admission-unknown source -Possibly related to dental abscess with recent use of Augmentin with incomplete course on account of diarrhea onset -Currently weaned off norepinephrine, continue midodrine and monitor on A-line -Continue cefepime, vancomycin, and Flagyl -Hold off on C. difficile testing as no further diarrhea present -Blood cultures pending -No signs of UTI noted -Lactic acidosis resolved  Acute metabolic encephalopathy secondary to above-improving -Continue to monitor closely -TSH 1.38 -Ammonia 29  Acute hypercapnic respiratory failure -Improved with the use of BiPAP -Monitor venous blood gas in a.m.  AKI related to above -Baseline creatinine 0.8-1.2 -Continue gentle IV fluid and monitor -Monitor strict I's and O's -Avoid nephrotoxic agents  Elevated LFTs related to above -No acute process noted on CT scan -Continue close monitoring -Discontinue statin  PAF -Improved rate control and off Cardizem -Continue digoxin and monitor on telemetry -Continue full dose Lovenox for now and then transition back to Eliquis  Type 2  diabetes -Blood glucose levels improved -SSI   DVT prophylaxis: Full dose Lovenox Code Status: Full Family Communication: Daughter at bedside 1/28 Disposition Plan:  Status is: Inpatient  Remains inpatient appropriate because: Requires IV medications and is in critical condition.   Consultants:  None  Procedures:  See below  Antimicrobials:  Anti-infectives (From admission, onward)    Start     Dose/Rate Route Frequency Ordered Stop   08/25/21 1600  vancomycin (VANCOREADY) IVPB 1250 mg/250 mL        1,250 mg 166.7 mL/hr over 90 Minutes Intravenous Every 24 hours 08/24/21 1649     08/25/21 0445  metroNIDAZOLE (FLAGYL) IVPB 500 mg        500 mg 100 mL/hr over 60 Minutes Intravenous Every 12 hours 08/25/21 0355     08/25/21 0400  ceFEPIme (MAXIPIME) 2 g in sodium chloride 0.9 % 100 mL IVPB        2 g 200 mL/hr over 30 Minutes Intravenous Every 12 hours 08/24/21 1649     08/24/21 1700  vancomycin (VANCOCIN) IVPB 1000 mg/200 mL premix        1,000 mg 200 mL/hr over 60 Minutes Intravenous  Once 08/24/21 1649 08/24/21 2008   08/24/21 1615  ceFEPIme (MAXIPIME) 2 g in sodium chloride 0.9 % 100 mL IVPB        2 g 200 mL/hr over 30 Minutes Intravenous  Once 08/24/21 1603 08/24/21 1705   08/24/21 1615  metroNIDAZOLE (FLAGYL) IVPB 500 mg        500 mg 100 mL/hr over 60 Minutes Intravenous  Once 08/24/21 1603 08/24/21 2007   08/24/21 1615  vancomycin (VANCOCIN) IVPB 1000 mg/200 mL premix        1,000 mg 200 mL/hr over  60 Minutes Intravenous  Once 08/24/21 1603 08/24/21 2007       Subjective: Patient seen and evaluated today and is currently on BiPAP and confused.  Daughter at bedside.  Norepinephrine is being weaned.  Heart rates are stabilizing.  Objective: Vitals:   08/25/21 0600 08/25/21 0758 08/25/21 0801 08/25/21 1126  BP:      Pulse: 95 90 100 98  Resp: 19 (!) 22 16 18   Temp:   (!) 97.2 F (36.2 C) 97.7 F (36.5 C)  TempSrc:   Axillary Oral  SpO2: 100% 100% 100%  98%  Weight:      Height:        Intake/Output Summary (Last 24 hours) at 08/25/2021 1217 Last data filed at 08/25/2021 1100 Gross per 24 hour  Intake 3694.26 ml  Output 750 ml  Net 2944.26 ml   Filed Weights   08/24/21 1638 08/24/21 2340  Weight: 107.4 kg 102.6 kg    Examination:  General exam: Somnolent Respiratory system: Clear to auscultation. Respiratory effort normal.  On BiPAP 18/6 FiO2 35% Cardiovascular system: S1 & S2 heard, RRR.  Irregular Gastrointestinal system: Abdomen is soft Central nervous system: Somnolent Extremities: 2+ pitting edema bilaterally Skin: No significant lesions noted Psychiatry: Cannot be assessed    Data Reviewed: I have personally reviewed following labs and imaging studies  CBC: Recent Labs  Lab 08/24/21 1602 08/24/21 1603 08/25/21 0237  WBC 8.3  --  6.9  NEUTROABS 6.8  --   --   HGB 8.8* 13.9 7.9*  HCT 33.4* 41.0 29.4*  MCV 60.1*  --  60.0*  PLT 222  --  627   Basic Metabolic Panel: Recent Labs  Lab 08/24/21 1602 08/24/21 1603 08/25/21 0237  NA 134* 136 133*  K 4.9 5.0 4.6  CL 97* 99 100  CO2 27  --  27  GLUCOSE 112* 106* 133*  BUN 21 21 26*  CREATININE 1.71* 1.50* 1.77*  CALCIUM 8.2*  --  7.7*   GFR: Estimated Creatinine Clearance: 43.8 mL/min (A) (by C-G formula based on SCr of 1.77 mg/dL (H)). Liver Function Tests: Recent Labs  Lab 08/24/21 1602 08/25/21 0237  AST 53* 124*  ALT 50* 73*  ALKPHOS 107 95  BILITOT 1.9* 1.6*  PROT 7.4 6.4*  ALBUMIN 3.1* 2.8*   No results for input(s): LIPASE, AMYLASE in the last 168 hours. Recent Labs  Lab 08/24/21 1849  AMMONIA 29   Coagulation Profile: Recent Labs  Lab 08/24/21 1602 08/25/21 0237  INR 2.0* 2.0*   Cardiac Enzymes: No results for input(s): CKTOTAL, CKMB, CKMBINDEX, TROPONINI in the last 168 hours. BNP (last 3 results) No results for input(s): PROBNP in the last 8760 hours. HbA1C: Recent Labs    08/24/21 2349  HGBA1C 6.3*   CBG: Recent  Labs  Lab 08/24/21 1558 08/24/21 2329 08/25/21 0415 08/25/21 0803 08/25/21 1127  GLUCAP 112* 126* 123* 80 57*   Lipid Profile: No results for input(s): CHOL, HDL, LDLCALC, TRIG, CHOLHDL, LDLDIRECT in the last 72 hours. Thyroid Function Tests: Recent Labs    08/24/21 1610  TSH 1.380   Anemia Panel: No results for input(s): VITAMINB12, FOLATE, FERRITIN, TIBC, IRON, RETICCTPCT in the last 72 hours. Sepsis Labs: Recent Labs  Lab 08/24/21 1645 08/24/21 1810 08/24/21 2348 08/24/21 2349 08/25/21 0237  PROCALCITON  --   --   --  0.27 0.28  LATICACIDVEN 7.3* 4.4* 2.5*  --  1.4    Recent Results (from the past 240 hour(s))  Blood  Culture (routine x 2)     Status: None (Preliminary result)   Collection Time: 08/24/21  4:02 PM   Specimen: Right Antecubital; Blood  Result Value Ref Range Status   Specimen Description   Final    RIGHT ANTECUBITAL BOTTLES DRAWN AEROBIC AND ANAEROBIC   Special Requests   Final    Blood Culture adequate volume Performed at [facility], [address] [place], [place] [address]    Culture PENDING  Incomplete   Report Status PENDING  Incomplete  Blood Culture (routine x 2)     Status: None (Preliminary result)   Collection Time: 08/24/21  4:07 PM   Specimen: Vein; Blood  Result Value Ref Range Status   Specimen Description   Final    BLOOD LEFT FOREARM BOTTLES DRAWN AEROBIC AND ANAEROBIC   Special Requests   Final    Blood Culture results may not be optimal due to an excessive volume of blood received in culture bottles Performed at [facility], [address], [place],  [address]    Culture PENDING  Incomplete   Report Status PENDING  Incomplete  Resp Panel by RT-PCR (Flu A&B, Covid) Nasopharyngeal Swab     Status: None   Collection Time: 08/24/21  5:52 PM   Specimen: Nasopharyngeal Swab; Nasopharyngeal(NP) swabs in vial transport medium  Result Value Ref Range Status   SARS Coronavirus 2 by RT PCR NEGATIVE NEGATIVE Final     Comment: (NOTE) SARS-CoV-2 target nucleic acids are NOT DETECTED.  The SARS-CoV-2 RNA is generally detectable in upper respiratory specimens during the acute phase of infection. The lowest concentration of SARS-CoV-2 viral copies this assay can detect is 138 copies/mL. A negative result does not preclude SARS-Cov-2 infection and should not be used as the sole basis for treatment or other patient management decisions. A negative result may occur with  improper specimen collection/handling, submission of specimen other than nasopharyngeal swab, presence of viral mutation(s) within the areas targeted by this assay, and inadequate number of viral copies(<138 copies/mL). A negative result must be combined with clinical observations, patient history, and epidemiological information. The expected result is Negative.  Fact Sheet for Patients:  EntrepreneurPulse.com.au  Fact Sheet for Healthcare Providers:  IncredibleEmployment.be  This test is no t yet approved or cleared by the [place] FDA and  has been authorized for detection and/or diagnosis of SARS-CoV-2 by FDA under an Emergency Use Authorization (EUA). This EUA will remain  in effect (meaning this test can be used) for the duration of the COVID-19 declaration under Section 564(b)(1) of the Act, 21 U.S.C.section 360bbb-3(b)(1), unless the authorization is terminated  or revoked sooner.       Influenza A by PCR NEGATIVE NEGATIVE Final   Influenza B by PCR NEGATIVE NEGATIVE Final    Comment: (NOTE) The Xpert Xpress SARS-CoV-2/FLU/RSV plus assay is intended as an aid in the diagnosis of influenza from Nasopharyngeal swab specimens and should not be used as a sole basis for treatment. Nasal washings and aspirates are unacceptable for Xpert Xpress SARS-CoV-2/FLU/RSV testing.  Fact Sheet for Patients: EntrepreneurPulse.com.au  Fact Sheet for Healthcare  Providers: IncredibleEmployment.be  This test is not yet approved or cleared by the [place] FDA and has been authorized for detection and/or diagnosis of SARS-CoV-2 by FDA under an Emergency Use Authorization (EUA). This EUA will remain in effect (meaning this test can be used) for the duration of the COVID-19 declaration under Section 564(b)(1) of the Act, 21 U.S.C. section 360bbb-3(b)(1), unless the authorization  is terminated or revoked.  Performed at [facility], [address], [place], [place] [address]   MRSA Next Gen by PCR, Nasal     Status: Abnormal   Collection Time: 08/24/21  8:20 PM   Specimen: Nasal Mucosa; Nasal Swab  Result Value Ref Range Status   MRSA by PCR Next Gen DETECTED (A) NOT DETECTED Final    Comment: RESULT CALLED TO, READ BACK BY AND VERIFIED WITH: TINAJERO,E @ 1914 ON 08/25/21 BY JUW (NOTE) The GeneXpert MRSA Assay (FDA approved for NASAL specimens only), is one component of a comprehensive MRSA colonization surveillance program. It is not intended to diagnose MRSA infection nor to guide or monitor treatment for MRSA infections. Test performance is not FDA approved in patients less than 34 years old. Performed at [facility], [address], [place], [place] [address]          Radiology Studies: CT CHEST ABDOMEN PELVIS W CONTRAST  Result Date: 08/24/2021 CLINICAL DATA:  Sepsis, worsening mental status, confusion EXAM: CT CHEST, ABDOMEN, AND PELVIS WITH CONTRAST TECHNIQUE: Multidetector CT imaging of the chest, abdomen and pelvis was performed following the standard protocol during bolus administration of intravenous contrast. RADIATION DOSE REDUCTION: This exam was performed according to the departmental dose-optimization program which includes automated exposure control, adjustment of the mA and/or kV according to patient size and/or use of iterative reconstruction technique. CONTRAST:  34mL OMNIPAQUE IOHEXOL 300 MG/ML  SOLN  COMPARISON:  08/24/2021, 08/30/2020 FINDINGS: CT CHEST FINDINGS Cardiovascular: The heart is enlarged, with trace pericardial effusion. There is marked atherosclerosis of the coronary vasculature. No evidence of thoracic aortic aneurysm or dissection. Moderate atherosclerosis of the aortic arch. Mediastinum/Nodes: Precarinal adenopathy measuring up to 17 mm in short axis. Subcentimeter lymph nodes are seen elsewhere within the mediastinum. No hilar or axillary adenopathy. Thyroid, trachea, and esophagus are grossly unremarkable. Lungs/Pleura: There are small bilateral pleural effusions, right greater than left. No acute airspace disease. No pneumothorax. Central airways are patent. Musculoskeletal: No acute or destructive bony lesions. Reconstructed images demonstrate no additional findings. CT ABDOMEN PELVIS FINDINGS Hepatobiliary: No focal liver abnormality is seen. No gallstones, gallbladder wall thickening, or biliary dilatation. Pancreas: Unremarkable. No pancreatic ductal dilatation or surrounding inflammatory changes. Spleen: Normal in size without focal abnormality. Adrenals/Urinary Tract: Adrenal glands are unremarkable. Kidneys are normal, without renal calculi, focal lesion, or hydronephrosis. Bladder is unremarkable. Stomach/Bowel: No bowel obstruction or ileus. Normal appendix right lower quadrant. No bowel wall thickening or inflammatory change. Vascular/Lymphatic: Aortic atherosclerosis. No enlarged abdominal or pelvic lymph nodes. Reproductive: Prostate is unremarkable. Other: Trace ascites most pronounced in the upper abdomen. No free intraperitoneal gas. No abdominal wall hernia. Musculoskeletal: No acute or destructive bony lesions. Reconstructed images demonstrate no additional findings. IMPRESSION: Chest: 1. Cardiomegaly, with trace pericardial effusion. 2. Small bilateral pleural effusions, right greater the left. 3. Mediastinal lymphadenopathy. 4. Aortic Atherosclerosis (ICD10-I70.0). Coronary  artery atherosclerosis. Abdomen/pelvis: 1. Trace ascites. 2. Otherwise no acute intra-abdominal or intrapelvic process. 3.  Aortic Atherosclerosis (ICD10-I70.0). Electronically Signed   By: Randa Ngo M.D.   On: 08/24/2021 17:49   DG CHEST PORT 1 VIEW  Result Date: 08/25/2021 CLINICAL DATA:  Edema. HX of asthma, diabetes and hypertension. EXAM: PORTABLE CHEST 1 VIEW COMPARISON:  08/24/2021 and older studies. FINDINGS: Moderate to marked enlargement of the cardiopericardial silhouette, stable. Persistent vascular congestion and interstitial thickening, without convincing change from the previous day's study. Additional opacity at the lung bases is consistent with small effusions, more evident on the  right. No pneumothorax. Left internal jugular central venous line is stable. IMPRESSION: 1. No convincing change from the previous day's study. 2. Enlarged cardiopericardial silhouette consistent with a combination of cardiomegaly and pericardial fluid as noted on the previous day's CT. 3. Right greater than left pleural effusions and vascular congestion with interstitial thickening. Findings support congestive heart failure and interstitial edema. Electronically Signed   By: Lajean Manes M.D.   On: 08/25/2021 09:40   DG Chest Port 1 View  Result Date: 08/24/2021 CLINICAL DATA:  Possible sepsis.  Altered mental status. EXAM: PORTABLE CHEST 1 VIEW COMPARISON:  09/05/2020 FINDINGS: Marked enlargement of the cardiopericardial silhouette, increased from prior. Pulmonary vascular congestion with diffuse bilateral interstitial prominence. Aortic atherosclerosis. No pleural effusion or pneumothorax. IMPRESSION: 1. Marked enlargement of the cardiopericardial silhouette, increased from prior. Pericardial effusion not excluded. 2. Pulmonary vascular congestion with diffuse bilateral interstitial prominence, likely representing mild edema. Electronically Signed   By: Davina Poke D.O.   On: 08/24/2021 16:30   DG  Chest Port 1V same Day  Result Date: 08/24/2021 CLINICAL DATA:  Internal jugular catheter placement EXAM: PORTABLE CHEST 1 VIEW COMPARISON:  08/24/2021 FINDINGS: Frontal view of the chest demonstrates interval placement of a left internal jugular catheter tip overlying superior vena cava. The cardiac silhouette remains enlarged. Persistent central vascular congestion without airspace disease. The bilateral effusions seen on recent CT are not well visualized. No pneumothorax. Right costophrenic angle is excluded by collimation. IMPRESSION: 1. No complication after left internal jugular catheter placement. 2. Persistent central vascular congestion. 3. Cardiomegaly. Electronically Signed   By: Randa Ngo M.D.   On: 08/24/2021 22:49        Scheduled Meds:  Chlorhexidine Gluconate Cloth  6 each Topical Q0600   digoxin  0.0625 mg Oral Daily   enoxaparin (LOVENOX) injection  100 mg Subcutaneous Q12H   finasteride  5 mg Oral Daily   insulin aspart  0-15 Units Subcutaneous Q4H   insulin detemir  20 Units Subcutaneous QHS   midodrine  15 mg Oral TID WC   mupirocin ointment  1 application Nasal BID   pantoprazole (PROTONIX) IV  40 mg Intravenous Q12H   rosuvastatin  20 mg Oral Daily   sucralfate  1 g Oral QID   umeclidinium bromide  1 puff Inhalation Daily   Continuous Infusions:  sodium chloride     ceFEPime (MAXIPIME) IV 2 g (08/25/21 0425)   diltiazem (CARDIZEM) infusion Stopped (08/25/21 0115)   metronidazole Stopped (08/25/21 8299)   norepinephrine (LEVOPHED) Adult infusion Stopped (08/25/21 1216)   vancomycin       LOS: 1 day    Critical care time: 65 minutes.    Joan Avetisyan Darleen Crocker, DO [facility]  If 7PM-7AM, please contact night-coverage [email or web address] 08/25/2021, 12:17 PM

## 2021-08-25 NOTE — Plan of Care (Signed)

## 2021-08-25 NOTE — Progress Notes (Signed)
ANTICOAGULATION CONSULT NOTE - Consult  Pharmacy Consult for Lovenox (Apixaban on hold) Indication: atrial fibrillation  No Known Allergies  Patient Measurements: Height: 5\' 8"  (172.7 cm) Weight: 102.6 kg (226 lb 3.1 oz) IBW/kg (Calculated) : 68.4  Vital Signs: Temp: 97.2 F (36.2 C) (01/28 0801) Temp Source: Axillary (01/28 0801) BP: 92/53 (01/28 0030) Pulse Rate: 100 (01/28 0801)  Labs: Recent Labs    08/24/21 1602 08/24/21 1603 08/25/21 0237  HGB 8.8* 13.9 7.9*  HCT 33.4* 41.0 29.4*  PLT 222  --  198  APTT 33  --   --   LABPROT 22.7*  --  22.8*  INR 2.0*  --  2.0*  CREATININE 1.71* 1.50* 1.77*     Estimated Creatinine Clearance: 43.8 mL/min (A) (by C-G formula based on SCr of 1.77 mg/dL (H)).   Medical History: Past Medical History:  Diagnosis Date   Arthritis    Asthma    CHF (congestive heart failure) ([place])    Diabetes mellitus without complication (HCC)    GERD (gastroesophageal reflux disease)    Hypercholesteremia    Hypertension      Assessment: Holding apixaban and starting Lovenox for afib. It has been >12 hours since last apixaban dose, ok to start Lovenox now. Labs as above-noted renal dysfunction.  Goal of Therapy:  Monitor platelets by anticoagulation protocol: Yes   Plan:  Lovenox 1 mg/kg subcutaneous q12h Daily CBC F/U ability to switch back to Apixaban (1/28 on bipap)  Thomasenia Sales, PharmD, [facility] Clinical Pharmacist

## 2021-08-25 NOTE — Progress Notes (Signed)
eLink Physician-Brief Progress Note Patient Name: Perry Hunter DOB: 1950/12/25 MRN: [id number]   Date of Service  08/25/2021  HPI/Events of Note  32 M history of DM, HFrEF 30-35%, HTN, PAF on apixaban, COPD/OSA. He is brought in due to shortness of breath and altered sensorium. Hypotensive and hypoxic on presentation. Also had fever. He reportedly was given Augmentin a week prior for a dental abscess which he did not complete due to development of diarrhea.  Seen on BiPap 18/6 rate pf 18 FiO2 35%, MV 12 BP 97/40  HR 1902, O2 98% On cardizem and norepinephrine  eICU Interventions  On vancomycin and cefepime empirically. UA to be collected. If diarrhea persistent check Cdiff given recent antibiotic use although no evidence of bowel inflammation on abdominal CT. If increasing pressor requirement would consider shifting cardizem to amiodarone for rate control. Check TSH, no recent result. Hypercapneic hypoxemic respiratory failure on BiPap. Cautious hydration     Intervention Category Evaluation Type: New Patient Evaluation  Judd Lien 08/25/2021, 12:02 AM

## 2021-08-26 ENCOUNTER — Inpatient Hospital Stay (HOSPITAL_COMMUNITY): Payer: Medicare Other

## 2021-08-26 LAB — CBC
HCT: 31.2 % — ABNORMAL LOW (ref 39.0–52.0)
Hemoglobin: 8.6 g/dL — ABNORMAL LOW (ref 13.0–17.0)
MCH: 16.7 pg — ABNORMAL LOW (ref 26.0–34.0)
MCHC: 27.6 g/dL — ABNORMAL LOW (ref 30.0–36.0)
MCV: 60.5 fL — ABNORMAL LOW (ref 80.0–100.0)
Platelets: 265 10*3/uL (ref 150–400)
RBC: 5.16 MIL/uL (ref 4.22–5.81)
RDW: 23.8 % — ABNORMAL HIGH (ref 11.5–15.5)
WBC: 10.1 10*3/uL (ref 4.0–10.5)
nRBC: 1.6 % — ABNORMAL HIGH (ref 0.0–0.2)

## 2021-08-26 LAB — COMPREHENSIVE METABOLIC PANEL
ALT: 146 U/L — ABNORMAL HIGH (ref 0–44)
AST: 292 U/L — ABNORMAL HIGH (ref 15–41)
Albumin: 2.9 g/dL — ABNORMAL LOW (ref 3.5–5.0)
Alkaline Phosphatase: 100 U/L (ref 38–126)
Anion gap: 8 (ref 5–15)
BUN: 39 mg/dL — ABNORMAL HIGH (ref 8–23)
CO2: 26 mmol/L (ref 22–32)
Calcium: 7.9 mg/dL — ABNORMAL LOW (ref 8.9–10.3)
Chloride: 99 mmol/L (ref 98–111)
Creatinine, Ser: 1.82 mg/dL — ABNORMAL HIGH (ref 0.61–1.24)
GFR, Estimated: 39 mL/min — ABNORMAL LOW (ref >60)
Glucose, Bld: 87 mg/dL (ref 70–99)
Potassium: 5.3 mmol/L — ABNORMAL HIGH (ref 3.5–5.1)
Sodium: 133 mmol/L — ABNORMAL LOW (ref 135–145)
Total Bilirubin: 1.7 mg/dL — ABNORMAL HIGH (ref 0.3–1.2)
Total Protein: 6.8 g/dL (ref 6.5–8.1)

## 2021-08-26 LAB — URINE CULTURE: Culture: NO GROWTH

## 2021-08-26 LAB — GLUCOSE, CAPILLARY
Glucose-Capillary: 104 mg/dL — ABNORMAL HIGH (ref 70–99)
Glucose-Capillary: 107 mg/dL — ABNORMAL HIGH (ref 70–99)
Glucose-Capillary: 111 mg/dL — ABNORMAL HIGH (ref 70–99)
Glucose-Capillary: 87 mg/dL (ref 70–99)
Glucose-Capillary: 89 mg/dL (ref 70–99)
Glucose-Capillary: 92 mg/dL (ref 70–99)

## 2021-08-26 LAB — MAGNESIUM: Magnesium: 1.8 mg/dL (ref 1.7–2.4)

## 2021-08-26 LAB — HEPATITIS PANEL, ACUTE
HCV Ab: NONREACTIVE
Hep A IgM: NONREACTIVE
Hep B C IgM: NONREACTIVE
Hepatitis B Surface Ag: NONREACTIVE

## 2021-08-26 LAB — BLOOD GAS, ARTERIAL
Acid-base deficit: 1.7 mmol/L (ref 0.0–2.0)
Bicarbonate: 22.2 mmol/L (ref 20.0–28.0)
Drawn by: 22179
FIO2: 28
O2 Saturation: 96.3 %
Patient temperature: 36.5
pCO2 arterial: 65.1 mmHg (ref 32.0–48.0)
pH, Arterial: 7.21 — ABNORMAL LOW (ref 7.350–7.450)
pO2, Arterial: 99.4 mmHg (ref 83.0–108.0)

## 2021-08-26 LAB — PROCALCITONIN: Procalcitonin: 0.32 ng/mL

## 2021-08-26 LAB — AMMONIA: Ammonia: 30 umol/L (ref 9–35)

## 2021-08-26 MED ORDER — SODIUM CHLORIDE 0.9 % IV SOLN: INTRAVENOUS | Status: AC

## 2021-08-26 MED ORDER — TRAZODONE HCL 50 MG PO TABS
50.0000 mg | ORAL_TABLET | Freq: Every day | ORAL | Status: DC
  Administered 2021-08-26 – 2021-09-03 (×9): 50 mg via ORAL
  Filled 2021-08-26 (×9): qty 1

## 2021-08-26 MED ORDER — SODIUM ZIRCONIUM CYCLOSILICATE 10 G PO PACK
10.0000 g | PACK | Freq: Once | ORAL | Status: AC
  Administered 2021-08-26: 10 g via ORAL
  Filled 2021-08-26: qty 1

## 2021-08-26 NOTE — Plan of Care (Signed)

## 2021-08-26 NOTE — Progress Notes (Signed)
PROGRESS NOTE    Perry Hunter  XFG:[id number] DOB: 1951-06-27 DOA: 08/24/2021 PCP: Neale Burly, MD   Brief Narrative:   Perry Hunter is a 71 y.o. male with a history of diabetes, CHF, hypertension, COPD, paroxysmal atrial fibrillation on anticoagulation, BPH, obstructive sleep apnea not on CPAP.  Patient brought to the hospital for increasing confusion, shortness of breath, fever.  Patient was admitted with septic shock with unknown source with associated metabolic encephalopathy and AKI.  Assessment & Plan:   Principal Problem:   Sepsis ([place]) Active Problems:   Essential hypertension   COPD (chronic obstructive pulmonary disease) (HCC)   Diabetes mellitus (HCC)   PAF (paroxysmal atrial fibrillation) (HCC)   Elevated LFTs   AKI (acute kidney injury) ([place])   Acute metabolic encephalopathy   Septic shock present on admission-unknown source -Possibly related to dental abscess with recent use of Augmentin with incomplete course on account of diarrhea onset -Also concern for worsening transaminitis and gallbladder wall thickening, possible acute cholecystitis?  Plan for general surgery evaluation by a.m. if continuing to worsen -Currently weaned off norepinephrine, continue midodrine -Continue cefepime, vancomycin, and Flagyl -Hold off on C. difficile testing as no further diarrhea present -Blood cultures negative x2 days -No signs of UTI noted -Lactic acidosis resolved   Acute metabolic encephalopathy secondary to above -Continue to monitor closely -TSH 1.38 -Ammonia 29   Acute hypercapnic respiratory failure-improved -Improved with the use of BiPAP -Monitor venous blood gas in a.m.   AKI related to above -Baseline creatinine 0.8-1.2 -Gentle, time-limited IV fluid due to poor oral intake -Monitor strict I's and O's -Avoid nephrotoxic agents  Hyperkalemia -Plan to administer Lokelma and monitor -Likely related to AKI   Elevated LFTs related to above -No acute  process noted on CT scan -Abdominal ultrasound with gallbladder wall thickening noted, may need general surgery evaluation by a.m. -Continue close monitoring -Discontinue statin   PAF -Improved rate control and off Cardizem -Continue digoxin and monitor on telemetry -Continue full dose Lovenox for now and then transition back to Eliquis once stable enough to do so   Type 2 diabetes -Blood glucose levels improved -SSI     DVT prophylaxis: Full dose Lovenox Code Status: DNR Family Communication: Daughter and Granddaughter at bedside 1/29 Disposition Plan:  Status is: Inpatient   Remains inpatient appropriate because: Requires IV medications and is in critical condition.     Consultants:  None   Procedures:  See below  Antimicrobials:  Anti-infectives (From admission, onward)    Start     Dose/Rate Route Frequency Ordered Stop   08/25/21 1600  vancomycin (VANCOREADY) IVPB 1250 mg/250 mL        1,250 mg 166.7 mL/hr over 90 Minutes Intravenous Every 24 hours 08/24/21 1649     08/25/21 0445  metroNIDAZOLE (FLAGYL) IVPB 500 mg        500 mg 100 mL/hr over 60 Minutes Intravenous Every 12 hours 08/25/21 0355     08/25/21 0400  ceFEPIme (MAXIPIME) 2 g in sodium chloride 0.9 % 100 mL IVPB        2 g 200 mL/hr over 30 Minutes Intravenous Every 12 hours 08/24/21 1649     08/24/21 1700  vancomycin (VANCOCIN) IVPB 1000 mg/200 mL premix        1,000 mg 200 mL/hr over 60 Minutes Intravenous  Once 08/24/21 1649 08/24/21 2008   08/24/21 1615  ceFEPIme (MAXIPIME) 2 g in sodium chloride 0.9 % 100 mL IVPB  2 g 200 mL/hr over 30 Minutes Intravenous  Once 08/24/21 1603 08/24/21 1705   08/24/21 1615  metroNIDAZOLE (FLAGYL) IVPB 500 mg        500 mg 100 mL/hr over 60 Minutes Intravenous  Once 08/24/21 1603 08/24/21 2007   08/24/21 1615  vancomycin (VANCOCIN) IVPB 1000 mg/200 mL premix        1,000 mg 200 mL/hr over 60 Minutes Intravenous  Once 08/24/21 1603 08/24/21 2007        Subjective: Patient seen and evaluated today and continues to remain confused.  He was weaned off of norepinephrine yesterday, but has started to require this again overnight.  Objective: Vitals:   08/26/21 0800 08/26/21 0830 08/26/21 0930 08/26/21 1005  BP: 112/62 (!) 106/53  114/67  Pulse: (!) 104 96  96  Resp: (!) 24 (!) 26  (!) 22  Temp:      TempSrc:      SpO2: 98% 98% 91% 100%  Weight:      Height:        Intake/Output Summary (Last 24 hours) at 08/26/2021 1207 Last data filed at 08/26/2021 1000 Gross per 24 hour  Intake 1239.89 ml  Output 250 ml  Net 989.89 ml   Filed Weights   08/24/21 1638 08/24/21 2340 08/26/21 0500  Weight: 107.4 kg 102.6 kg 106.3 kg    Examination:  General exam: Appears confused and minimally agitated Respiratory system: Clear to auscultation. Respiratory effort normal. Cardiovascular system: S1 & S2 heard, RRR.  Gastrointestinal system: Abdomen is soft Central nervous system: Drowsy, but arousable Extremities: Persistent 2+ edema in bilateral lower extremities Skin: No significant lesions noted Psychiatry: Flat affect.    Data Reviewed: I have personally reviewed following labs and imaging studies  CBC: Recent Labs  Lab 08/24/21 1602 08/24/21 1603 08/25/21 0237 08/26/21 0448  WBC 8.3  --  6.9 10.1  NEUTROABS 6.8  --   --   --   HGB 8.8* 13.9 7.9* 8.6*  HCT 33.4* 41.0 29.4* 31.2*  MCV 60.1*  --  60.0* 60.5*  PLT 222  --  198 614   Basic Metabolic Panel: Recent Labs  Lab 08/24/21 1602 08/24/21 1603 08/25/21 0237 08/26/21 0448  NA 134* 136 133* 133*  K 4.9 5.0 4.6 5.3*  CL 97* 99 100 99  CO2 27  --  27 26  GLUCOSE 112* 106* 133* 87  BUN 21 21 26* 39*  CREATININE 1.71* 1.50* 1.77* 1.82*  CALCIUM 8.2*  --  7.7* 7.9*  MG  --   --   --  1.8   GFR: Estimated Creatinine Clearance: 43.4 mL/min (A) (by C-G formula based on SCr of 1.82 mg/dL (H)). Liver Function Tests: Recent Labs  Lab 08/24/21 1602 08/25/21 0237  08/26/21 0448  AST 53* 124* 292*  ALT 50* 73* 146*  ALKPHOS 107 95 100  BILITOT 1.9* 1.6* 1.7*  PROT 7.4 6.4* 6.8  ALBUMIN 3.1* 2.8* 2.9*   No results for input(s): LIPASE, AMYLASE in the last 168 hours. Recent Labs  Lab 08/24/21 1849  AMMONIA 29   Coagulation Profile: Recent Labs  Lab 08/24/21 1602 08/25/21 0237  INR 2.0* 2.0*   Cardiac Enzymes: No results for input(s): CKTOTAL, CKMB, CKMBINDEX, TROPONINI in the last 168 hours. BNP (last 3 results) No results for input(s): PROBNP in the last 8760 hours. HbA1C: Recent Labs    08/24/21 2349  HGBA1C 6.3*   CBG: Recent Labs  Lab 08/25/21 2001 08/26/21 0023 08/26/21 0418 08/26/21  5366 08/26/21 1117  GLUCAP 70 92 89 104* 107*   Lipid Profile: No results for input(s): CHOL, HDL, LDLCALC, TRIG, CHOLHDL, LDLDIRECT in the last 72 hours. Thyroid Function Tests: Recent Labs    08/24/21 1610  TSH 1.380   Anemia Panel: No results for input(s): VITAMINB12, FOLATE, FERRITIN, TIBC, IRON, RETICCTPCT in the last 72 hours. Sepsis Labs: Recent Labs  Lab 08/24/21 1645 08/24/21 1810 08/24/21 2348 08/24/21 2349 08/25/21 0237 08/26/21 0448  PROCALCITON  --   --   --  0.27 0.28 0.32  LATICACIDVEN 7.3* 4.4* 2.5*  --  1.4  --     Recent Results (from the past 240 hour(s))  Blood Culture (routine x 2)     Status: None (Preliminary result)   Collection Time: 08/24/21  4:02 PM   Specimen: Right Antecubital; Blood  Result Value Ref Range Status   Specimen Description   Final    RIGHT ANTECUBITAL BOTTLES DRAWN AEROBIC AND ANAEROBIC   Special Requests Blood Culture adequate volume  Final   Culture   Final    NO GROWTH 2 DAYS Performed at [facility], [address] [place], [place] [address]    Report Status PENDING  Incomplete  Blood Culture (routine x 2)     Status: None (Preliminary result)   Collection Time: 08/24/21  4:07 PM   Specimen: BLOOD LEFT FOREARM  Result Value Ref Range Status   Specimen Description    Final    BLOOD LEFT FOREARM BOTTLES DRAWN AEROBIC AND ANAEROBIC   Special Requests   Final    Blood Culture results may not be optimal due to an excessive volume of blood received in culture bottles   Culture   Final    NO GROWTH 2 DAYS Performed at [facility], [address], [place], [place] [address]    Report Status PENDING  Incomplete  Resp Panel by RT-PCR (Flu A&B, Covid) Nasopharyngeal Swab     Status: None   Collection Time: 08/24/21  5:52 PM   Specimen: Nasopharyngeal Swab; Nasopharyngeal(NP) swabs in vial transport medium  Result Value Ref Range Status   SARS Coronavirus 2 by RT PCR NEGATIVE NEGATIVE Final    Comment: (NOTE) SARS-CoV-2 target nucleic acids are NOT DETECTED.  The SARS-CoV-2 RNA is generally detectable in upper respiratory specimens during the acute phase of infection. The lowest concentration of SARS-CoV-2 viral copies this assay can detect is 138 copies/mL. A negative result does not preclude SARS-Cov-2 infection and should not be used as the sole basis for treatment or other patient management decisions. A negative result may occur with  improper specimen collection/handling, submission of specimen other than nasopharyngeal swab, presence of viral mutation(s) within the areas targeted by this assay, and inadequate number of viral copies(<138 copies/mL). A negative result must be combined with clinical observations, patient history, and epidemiological information. The expected result is Negative.  Fact Sheet for Patients:  EntrepreneurPulse.com.au  Fact Sheet for Healthcare Providers:  IncredibleEmployment.be  This test is no t yet approved or cleared by the [place] FDA and  has been authorized for detection and/or diagnosis of SARS-CoV-2 by FDA under an Emergency Use Authorization (EUA). This EUA will remain  in effect (meaning this test can be used) for the duration of the COVID-19 declaration under  Section 564(b)(1) of the Act, 21 U.S.C.section 360bbb-3(b)(1), unless the authorization is terminated  or revoked sooner.       Influenza A by PCR NEGATIVE NEGATIVE Final   Influenza B by  PCR NEGATIVE NEGATIVE Final    Comment: (NOTE) The Xpert Xpress SARS-CoV-2/FLU/RSV plus assay is intended as an aid in the diagnosis of influenza from Nasopharyngeal swab specimens and should not be used as a sole basis for treatment. Nasal washings and aspirates are unacceptable for Xpert Xpress SARS-CoV-2/FLU/RSV testing.  Fact Sheet for Patients: EntrepreneurPulse.com.au  Fact Sheet for Healthcare Providers: IncredibleEmployment.be  This test is not yet approved or cleared by the [place] FDA and has been authorized for detection and/or diagnosis of SARS-CoV-2 by FDA under an Emergency Use Authorization (EUA). This EUA will remain in effect (meaning this test can be used) for the duration of the COVID-19 declaration under Section 564(b)(1) of the Act, 21 U.S.C. section 360bbb-3(b)(1), unless the authorization is terminated or revoked.  Performed at [facility], [address], [place], [place] [address]   MRSA Next Gen by PCR, Nasal     Status: Abnormal   Collection Time: 08/24/21  8:20 PM   Specimen: Nasal Mucosa; Nasal Swab  Result Value Ref Range Status   MRSA by PCR Next Gen DETECTED (A) NOT DETECTED Final    Comment: RESULT CALLED TO, READ BACK BY AND VERIFIED WITH: TINAJERO,E @ 3557 ON 08/25/21 BY JUW (NOTE) The GeneXpert MRSA Assay (FDA approved for NASAL specimens only), is one component of a comprehensive MRSA colonization surveillance program. It is not intended to diagnose MRSA infection nor to guide or monitor treatment for MRSA infections. Test performance is not FDA approved in patients less than 55 years old. Performed at [facility], [address], [place], [place] [address]   Urine Culture     Status: None   Collection Time:  08/25/21 12:28 AM   Specimen: In/Out Cath Urine  Result Value Ref Range Status   Specimen Description   Final    IN/OUT CATH URINE Performed at [facility], [address] [place], [place] [address]    Special Requests   Final    NONE Performed at [facility], [address] [place], [place] [address]    Culture   Final    NO GROWTH Performed at [facility], [place] [address], [place], [place] [address]    Report Status 08/26/2021 FINAL  Final         Radiology Studies: CT CHEST ABDOMEN PELVIS W CONTRAST  Result Date: 08/24/2021 CLINICAL DATA:  Sepsis, worsening mental status, confusion EXAM: CT CHEST, ABDOMEN, AND PELVIS WITH CONTRAST TECHNIQUE: Multidetector CT imaging of the chest, abdomen and pelvis was performed following the standard protocol during bolus administration of intravenous contrast. RADIATION DOSE REDUCTION: This exam was performed according to the departmental dose-optimization program which includes automated exposure control, adjustment of the mA and/or kV according to patient size and/or use of iterative reconstruction technique. CONTRAST:  60mL OMNIPAQUE IOHEXOL 300 MG/ML  SOLN COMPARISON:  08/24/2021, 08/30/2020 FINDINGS: CT CHEST FINDINGS Cardiovascular: The heart is enlarged, with trace pericardial effusion. There is marked atherosclerosis of the coronary vasculature. No evidence of thoracic aortic aneurysm or dissection. Moderate atherosclerosis of the aortic arch. Mediastinum/Nodes: Precarinal adenopathy measuring up to 17 mm in short axis. Subcentimeter lymph nodes are seen elsewhere within the mediastinum. No hilar or axillary adenopathy. Thyroid, trachea, and esophagus are grossly unremarkable. Lungs/Pleura: There are small bilateral pleural effusions, right greater than left. No acute airspace disease. No pneumothorax. Central airways are patent. Musculoskeletal: No acute or destructive bony lesions. Reconstructed images demonstrate no additional  findings. CT ABDOMEN PELVIS FINDINGS Hepatobiliary: No focal liver abnormality  is seen. No gallstones, gallbladder wall thickening, or biliary dilatation. Pancreas: Unremarkable. No pancreatic ductal dilatation or surrounding inflammatory changes. Spleen: Normal in size without focal abnormality. Adrenals/Urinary Tract: Adrenal glands are unremarkable. Kidneys are normal, without renal calculi, focal lesion, or hydronephrosis. Bladder is unremarkable. Stomach/Bowel: No bowel obstruction or ileus. Normal appendix right lower quadrant. No bowel wall thickening or inflammatory change. Vascular/Lymphatic: Aortic atherosclerosis. No enlarged abdominal or pelvic lymph nodes. Reproductive: Prostate is unremarkable. Other: Trace ascites most pronounced in the upper abdomen. No free intraperitoneal gas. No abdominal wall hernia. Musculoskeletal: No acute or destructive bony lesions. Reconstructed images demonstrate no additional findings. IMPRESSION: Chest: 1. Cardiomegaly, with trace pericardial effusion. 2. Small bilateral pleural effusions, right greater the left. 3. Mediastinal lymphadenopathy. 4. Aortic Atherosclerosis (ICD10-I70.0). Coronary artery atherosclerosis. Abdomen/pelvis: 1. Trace ascites. 2. Otherwise no acute intra-abdominal or intrapelvic process. 3.  Aortic Atherosclerosis (ICD10-I70.0). Electronically Signed   By: Randa Ngo M.D.   On: 08/24/2021 17:49   DG CHEST PORT 1 VIEW  Result Date: 08/25/2021 CLINICAL DATA:  Edema. HX of asthma, diabetes and hypertension. EXAM: PORTABLE CHEST 1 VIEW COMPARISON:  08/24/2021 and older studies. FINDINGS: Moderate to marked enlargement of the cardiopericardial silhouette, stable. Persistent vascular congestion and interstitial thickening, without convincing change from the previous day's study. Additional opacity at the lung bases is consistent with small effusions, more evident on the right. No pneumothorax. Left internal jugular central venous line is stable.  IMPRESSION: 1. No convincing change from the previous day's study. 2. Enlarged cardiopericardial silhouette consistent with a combination of cardiomegaly and pericardial fluid as noted on the previous day's CT. 3. Right greater than left pleural effusions and vascular congestion with interstitial thickening. Findings support congestive heart failure and interstitial edema. Electronically Signed   By: Lajean Manes M.D.   On: 08/25/2021 09:40   DG Chest Port 1 View  Result Date: 08/24/2021 CLINICAL DATA:  Possible sepsis.  Altered mental status. EXAM: PORTABLE CHEST 1 VIEW COMPARISON:  09/05/2020 FINDINGS: Marked enlargement of the cardiopericardial silhouette, increased from prior. Pulmonary vascular congestion with diffuse bilateral interstitial prominence. Aortic atherosclerosis. No pleural effusion or pneumothorax. IMPRESSION: 1. Marked enlargement of the cardiopericardial silhouette, increased from prior. Pericardial effusion not excluded. 2. Pulmonary vascular congestion with diffuse bilateral interstitial prominence, likely representing mild edema. Electronically Signed   By: Davina Poke D.O.   On: 08/24/2021 16:30   DG Chest Port 1V same Day  Result Date: 08/24/2021 CLINICAL DATA:  Internal jugular catheter placement EXAM: PORTABLE CHEST 1 VIEW COMPARISON:  08/24/2021 FINDINGS: Frontal view of the chest demonstrates interval placement of a left internal jugular catheter tip overlying superior vena cava. The cardiac silhouette remains enlarged. Persistent central vascular congestion without airspace disease. The bilateral effusions seen on recent CT are not well visualized. No pneumothorax. Right costophrenic angle is excluded by collimation. IMPRESSION: 1. No complication after left internal jugular catheter placement. 2. Persistent central vascular congestion. 3. Cardiomegaly. Electronically Signed   By: Randa Ngo M.D.   On: 08/24/2021 22:49   US Abdomen Limited RUQ (LIVER/GB)  Result  Date: 08/26/2021 CLINICAL DATA:  Transaminitis. EXAM: ULTRASOUND ABDOMEN LIMITED RIGHT UPPER QUADRANT COMPARISON:  August 24, 2021.  August 27, 2020. FINDINGS: Gallbladder: No cholelithiasis is noted. Moderate to severe gallbladder wall thickening is noted measuring 7 mm. Mild ascites is noted. Common bile duct: Diameter: 6 mm which is within normal limits. Liver: No focal lesion identified. Mildly increased echogenicity of hepatic parenchyma is noted suggesting hepatic steatosis or  other diffuse hepatocellular disease. Portal vein is patent on color Doppler imaging with normal direction of blood flow towards the liver. Other: None. IMPRESSION: Mildly increased echogenicity of hepatic parenchyma is noted suggesting hepatic steatosis or other diffuse hepatocellular disease. Moderate to severe gallbladder wall thickening is noted without cholelithiasis. It is uncertain if this is due to cholecystitis or adjacent hepatocellular disease. Mild ascites is noted in the right upper quadrant. No definite biliary dilatation is noted. Electronically Signed   By: Marijo Conception M.D.   On: 08/26/2021 11:53        Scheduled Meds:  Chlorhexidine Gluconate Cloth  6 each Topical Q0600   digoxin  0.0625 mg Oral Daily   enoxaparin (LOVENOX) injection  100 mg Subcutaneous Q12H   finasteride  5 mg Oral Daily   insulin aspart  0-15 Units Subcutaneous Q4H   insulin detemir  20 Units Subcutaneous QHS   midodrine  15 mg Oral TID WC   mupirocin ointment  1 application Nasal BID   pantoprazole (PROTONIX) IV  40 mg Intravenous Q12H   sucralfate  1 g Oral QID   traZODone  50 mg Oral QHS   umeclidinium bromide  1 puff Inhalation Daily   Continuous Infusions:  sodium chloride     ceFEPime (MAXIPIME) IV 2 g (08/26/21 0936)   diltiazem (CARDIZEM) infusion Stopped (08/25/21 0115)   metronidazole Stopped (08/26/21 0538)   norepinephrine (LEVOPHED) Adult infusion 4 mcg/min (08/26/21 0732)   vancomycin 1,250 mg (08/25/21  1721)     LOS: 2 days    Time spent: 35 minutes    Blossom Crume D Manuella Ghazi, DO [facility]  If 7PM-7AM, please contact night-coverage [email or web address] 08/26/2021, 12:07 PM

## 2021-08-26 NOTE — Progress Notes (Signed)
PT Cancellation Note  Patient Details Name: Perry Hunter MRN: [id number] DOB: 1951-02-18   Cancelled Treatment:    Reason Eval/Treat Not Completed: Medical issues which prohibited therapy   Therapist spoke to nurse who stated to hold treatment today.  Rayetta Humphrey, PT CLT [phone]  08/26/2021, 10:19 AM

## 2021-08-26 NOTE — Progress Notes (Signed)
ANTICOAGULATION CONSULT NOTE - Consult  Pharmacy Consult for Lovenox (Apixaban on hold) Indication: atrial fibrillation  No Known Allergies  Patient Measurements: Height: 5\' 8"  (172.7 cm) Weight: 106.3 kg (234 lb 5.6 oz) IBW/kg (Calculated) : 68.4  Vital Signs: Temp: 97.8 F (36.6 C) (01/29 0758) Temp Source: Axillary (01/29 0758) BP: 112/62 (01/29 0800) Pulse Rate: 104 (01/29 0800)  Labs: Recent Labs    08/24/21 1602 08/24/21 1603 08/25/21 0237 08/26/21 0448  HGB 8.8* 13.9 7.9* 8.6*  HCT 33.4* 41.0 29.4* 31.2*  PLT 222  --  198 265  APTT 33  --   --   --   LABPROT 22.7*  --  22.8*  --   INR 2.0*  --  2.0*  --   CREATININE 1.71* 1.50* 1.77* 1.82*     Estimated Creatinine Clearance: 43.4 mL/min (A) (by C-G formula based on SCr of 1.82 mg/dL (H)).   Medical History: Past Medical History:  Diagnosis Date   Arthritis    Asthma    CHF (congestive heart failure) ([place])    Diabetes mellitus without complication (HCC)    GERD (gastroesophageal reflux disease)    Hypercholesteremia    Hypertension      Assessment: Holding apixaban and starting Lovenox for afib. It has been >12 hours since last apixaban dose, ok to start Lovenox now. Labs as above-noted renal dysfunction. Hgb trending up, 8.6 today   Goal of Therapy:  Monitor platelets by anticoagulation protocol: Yes   Plan:  Lovenox 1 mg/kg subcutaneous q12h Daily CBC F/U ability to switch back to Apixaban (1/28 on bipap)  Thomasenia Sales, PharmD, [facility] Clinical Pharmacist

## 2021-08-27 ENCOUNTER — Encounter (HOSPITAL_COMMUNITY): Payer: Self-pay

## 2021-08-27 ENCOUNTER — Inpatient Hospital Stay (HOSPITAL_COMMUNITY): Payer: Medicare Other

## 2021-08-27 LAB — COMPREHENSIVE METABOLIC PANEL
ALT: 207 U/L — ABNORMAL HIGH (ref 0–44)
AST: 413 U/L — ABNORMAL HIGH (ref 15–41)
Albumin: 2.7 g/dL — ABNORMAL LOW (ref 3.5–5.0)
Alkaline Phosphatase: 103 U/L (ref 38–126)
Anion gap: 7 (ref 5–15)
BUN: 38 mg/dL — ABNORMAL HIGH (ref 8–23)
CO2: 26 mmol/L (ref 22–32)
Calcium: 7.8 mg/dL — ABNORMAL LOW (ref 8.9–10.3)
Chloride: 100 mmol/L (ref 98–111)
Creatinine, Ser: 1.51 mg/dL — ABNORMAL HIGH (ref 0.61–1.24)
GFR, Estimated: 49 mL/min — ABNORMAL LOW (ref >60)
Glucose, Bld: 85 mg/dL (ref 70–99)
Potassium: 4.6 mmol/L (ref 3.5–5.1)
Sodium: 133 mmol/L — ABNORMAL LOW (ref 135–145)
Total Bilirubin: 1.6 mg/dL — ABNORMAL HIGH (ref 0.3–1.2)
Total Protein: 6.5 g/dL (ref 6.5–8.1)

## 2021-08-27 LAB — CBC
HCT: 30.1 % — ABNORMAL LOW (ref 39.0–52.0)
Hemoglobin: 8.3 g/dL — ABNORMAL LOW (ref 13.0–17.0)
MCH: 16.6 pg — ABNORMAL LOW (ref 26.0–34.0)
MCHC: 27.6 g/dL — ABNORMAL LOW (ref 30.0–36.0)
MCV: 60.2 fL — ABNORMAL LOW (ref 80.0–100.0)
Platelets: 290 10*3/uL (ref 150–400)
RBC: 5 MIL/uL (ref 4.22–5.81)
RDW: 23.6 % — ABNORMAL HIGH (ref 11.5–15.5)
WBC: 9.1 10*3/uL (ref 4.0–10.5)
nRBC: 1 % — ABNORMAL HIGH (ref 0.0–0.2)

## 2021-08-27 LAB — VANCOMYCIN, TROUGH
Vancomycin Tr: 22 ug/mL (ref 15–20)
Vancomycin Tr: 18 ug/mL (ref 15–20)

## 2021-08-27 LAB — GLUCOSE, CAPILLARY
Glucose-Capillary: 101 mg/dL — ABNORMAL HIGH (ref 70–99)
Glucose-Capillary: 104 mg/dL — ABNORMAL HIGH (ref 70–99)
Glucose-Capillary: 126 mg/dL — ABNORMAL HIGH (ref 70–99)
Glucose-Capillary: 74 mg/dL (ref 70–99)
Glucose-Capillary: 87 mg/dL (ref 70–99)
Glucose-Capillary: 91 mg/dL (ref 70–99)
Glucose-Capillary: 93 mg/dL (ref 70–99)

## 2021-08-27 LAB — MAGNESIUM: Magnesium: 1.9 mg/dL (ref 1.7–2.4)

## 2021-08-27 MED ORDER — SODIUM CHLORIDE 0.9 % IV SOLN: INTRAVENOUS | Status: AC

## 2021-08-27 MED ORDER — TECHNETIUM TC 99M MEBROFENIN IV KIT
5.0000 | PACK | Freq: Once | INTRAVENOUS | Status: AC | PRN
  Administered 2021-08-27: 5.1 via INTRAVENOUS

## 2021-08-27 NOTE — Evaluation (Signed)
Physical Therapy Evaluation Patient Details Name: Perry Hunter MRN: [id number] DOB: April 24, 1951 Today's Date: 08/27/2021  History of Present Illness  Perry Hunter is a 71 y.o. male with a history of diabetes, CHF, hypertension, COPD, paroxysmal atrial fibrillation on anticoagulation, BPH, obstructive sleep apnea not on CPAP.  Patient brought to the hospital for increasing confusion, shortness of breath, fever.  Patient is unable to provide history given the degree of confusion.  History is obtained from the patient's daughter and the patient's granddaughter.  Patient seen recently for concerns of a dental infection and was prescribed antibiotics at the care approximately 6 days ago.  The area in question appears to be an aphthous ulcer.  However, the patient was placed on Augmentin.  The patient began to have diarrhea and stopped taking his medications.  This morning, the patient became extremely confused and altered and the patient was brought to the hospital for evaluation.  On arrival, the patient was hypoxic to 85% on room air.  It increased to normal on 4 L nasal cannula.  He was initially hypotensive and febrile.  The patient was started on IV fluids and received 2 L bolus.  Additionally, the patient is on midodrine and was given that with good response to his blood pressures.   Clinical Impression  Patient demonstrates slightly labored movement for sitting up at bedside, unsteady on feet and limited to a few side steps and steps forward/backwards at bedside with frequent buckling of knees due to weakness.  Patient put back to bed after therapy to be taken to a procedure.  Patient will benefit from continued skilled physical therapy in hospital and recommended venue below to increase strength, balance, endurance for safe ADLs and gait.        Recommendations for follow up therapy are one component of a multi-disciplinary discharge planning process, led by the attending physician.  Recommendations  may be updated based on patient status, additional functional criteria and insurance authorization.  Follow Up Recommendations Home health PT    Assistance Recommended at Discharge Intermittent Supervision/Assistance  Patient can return home with the following  A little help with bathing/dressing/bathroom;Help with stairs or ramp for entrance;A lot of help with walking and/or transfers;Assistance with cooking/housework    Equipment Recommendations Rolling walker (2 wheels)  Recommendations for Other Services       Functional Status Assessment Patient has had a recent decline in their functional status and demonstrates the ability to make significant improvements in function in a reasonable and predictable amount of time.     Precautions / Restrictions Precautions Precautions: Fall Restrictions Weight Bearing Restrictions: No      Mobility  Bed Mobility Overal bed mobility: Needs Assistance Bed Mobility: Supine to Sit, Sit to Supine     Supine to sit: Min guard Sit to supine: Min guard   General bed mobility comments: increased time, slightly labored movement    Transfers Overall transfer level: Needs assistance Equipment used: Rolling walker (2 wheels) Transfers: Sit to/from Stand Sit to Stand: Min assist, Min guard           General transfer comment: increased time, labored movement    Ambulation/Gait Ambulation/Gait assistance: Min assist Gait Distance (Feet): 10 Feet Assistive device: Rolling walker (2 wheels) Gait Pattern/deviations: Decreased step length - right, Decreased step length - left, Decreased stride length Gait velocity: decreased     General Gait Details: slow labored cadence while taking side steps and steps forward/backwards with frequent buckling of knees without loss of balance  Stairs            Wheelchair Mobility    Modified Rankin (Stroke Patients Only)       Balance Overall balance assessment: Needs  assistance Sitting-balance support: Feet supported, No upper extremity supported Sitting balance-Leahy Scale: Good Sitting balance - Comments: seated at EOB   Standing balance support: Reliant on assistive device for balance, During functional activity, Bilateral upper extremity supported Standing balance-Leahy Scale: Poor Standing balance comment: fair/poor using RW                             Pertinent Vitals/Pain Pain Assessment Pain Assessment: No/denies pain    Home Living Family/patient expects to be discharged to:: Private residence Living Arrangements: Spouse/significant other;Children Available Help at Discharge: Family Type of Home: Apartment Home Access: Stairs to enter Entrance Stairs-Rails: Right Entrance Stairs-Number of Steps: flight   Home Layout: One level Home Equipment: Advice worker (2 wheels);Cane - single point;BSC/3in1      Prior Function Prior Level of Function : Needs assist       Physical Assist : Mobility (physical);ADLs (physical) Mobility (physical): Bed mobility;Transfers;Gait;Stairs   Mobility Comments: household ambulator using RW ADLs Comments: assisted by family     Hand Dominance   Dominant Hand: Right    Extremity/Trunk Assessment   Upper Extremity Assessment Upper Extremity Assessment: Generalized weakness    Lower Extremity Assessment Lower Extremity Assessment: Generalized weakness    Cervical / Trunk Assessment Cervical / Trunk Assessment: Normal  Communication   Communication: HOH  Cognition Arousal/Alertness: Awake/alert Behavior During Therapy: WFL for tasks assessed/performed Overall Cognitive Status: Within Functional Limits for tasks assessed                                          General Comments      Exercises     Assessment/Plan    PT Assessment Patient needs continued PT services  PT Problem List Decreased activity tolerance;Decreased strength;Decreased  balance;Decreased mobility       PT Treatment Interventions DME instruction;Gait training;Stair training;Functional mobility training;Therapeutic activities;Therapeutic exercise;Patient/family education;Balance training    PT Goals (Current goals can be found in the Care Plan section)  Acute Rehab PT Goals Patient Stated Goal: return home with family to assist PT Goal Formulation: With patient/family Time For Goal Achievement: 09/03/21 Potential to Achieve Goals: Good    Frequency Min 3X/week     Co-evaluation               AM-PAC PT "6 Clicks" Mobility  Outcome Measure Help needed turning from your back to your side while in a flat bed without using bedrails?: A Little Help needed moving from lying on your back to sitting on the side of a flat bed without using bedrails?: A Little Help needed moving to and from a bed to a chair (including a wheelchair)?: A Little Help needed standing up from a chair using your arms (e.g., wheelchair or bedside chair)?: A Little Help needed to walk in hospital room?: A Lot Help needed climbing 3-5 steps with a railing? : A Lot 6 Click Score: 16    End of Session   Activity Tolerance: Patient tolerated treatment well;Patient limited by fatigue Patient left: in bed;with call bell/phone within reach Nurse Communication: Mobility status PT Visit Diagnosis: Unsteadiness on feet (R26.81);Other abnormalities of gait and  mobility (R26.89);Muscle weakness (generalized) (M62.81)    Time: 4136-4383 PT Time Calculation (min) (ACUTE ONLY): 23 min   Charges:   PT Evaluation $PT Eval Moderate Complexity: 1 Mod PT Treatments $Therapeutic Activity: 23-37 mins        3:53 PM, 08/27/21 Lonell Grandchild, MPT Physical Therapist with [facility] [phone] office [phone] mobile phone

## 2021-08-27 NOTE — Progress Notes (Signed)
PROGRESS NOTE    Perry Hunter  SFK:[id number] DOB: 1950-08-16 DOA: 08/24/2021 PCP: Neale Burly, MD   Brief Narrative:   Perry Hunter is a 71 y.o. male with a history of diabetes, CHF, hypertension, COPD, paroxysmal atrial fibrillation on anticoagulation, BPH, obstructive sleep apnea not on CPAP.  Patient brought to the hospital for increasing confusion, shortness of breath, fever.  Patient was admitted with septic shock with unknown source with associated metabolic encephalopathy and AKI.  He now appears to have some findings of cholecystitis for which further evaluation is pending.  Assessment & Plan:   Principal Problem:   Sepsis ([place]) Active Problems:   Essential hypertension   COPD (chronic obstructive pulmonary disease) (HCC)   Diabetes mellitus (HCC)   PAF (paroxysmal atrial fibrillation) (HCC)   Elevated LFTs   AKI (acute kidney injury) ([place])   Acute metabolic encephalopathy   Septic shock present on admission-unknown source -Possibly related to cholecystitis -Currently weaned off norepinephrine, continue midodrine -Continue cefepime, vancomycin, and Flagyl -Hold off on C. difficile testing as no further diarrhea present -Blood cultures negative x2 days -No signs of UTI noted -Lactic acidosis resolved   Acute metabolic encephalopathy secondary to above -Continue to monitor closely -TSH 1.38 -Ammonia 29   Acute hypercapnic respiratory failure-improved -Improved with the use of BiPAP -Monitor venous blood gas in a.m.   AKI related to above -Baseline creatinine 0.8-1.2 -Gentle, time-limited IV fluid due to poor oral intake; improving with IV fluid -Monitor strict I's and O's -Avoid nephrotoxic agents   Hyperkalemia -Plan to administer Lokelma and monitor -Likely related to AKI   Elevated LFTs related to above -No acute process noted on CT scan -Abdominal ultrasound with gallbladder wall thickening noted, may need general surgery evaluation by  a.m. -Plan to order HIDA scan 1/30 -Discontinue statin   PAF -Improved rate control and off Cardizem -Continue digoxin and monitor on telemetry -Continue full dose Lovenox for now and then transition back to Eliquis once stable enough to do so   Type 2 diabetes -Blood glucose levels improved -SSI     DVT prophylaxis: Full dose Lovenox Code Status: DNR Family Communication: Daughter and Granddaughter at bedside 1/30 Disposition Plan:  Status is: Inpatient   Remains inpatient appropriate because: Requires IV medications and is in critical condition.     Consultants:  General surgery   Procedures:  See below   Antimicrobials:  Anti-infectives (From admission, onward)    Start     Dose/Rate Route Frequency Ordered Stop   08/25/21 1600  vancomycin (VANCOREADY) IVPB 1250 mg/250 mL        1,250 mg 166.7 mL/hr over 90 Minutes Intravenous Every 24 hours 08/24/21 1649     08/25/21 0445  metroNIDAZOLE (FLAGYL) IVPB 500 mg        500 mg 100 mL/hr over 60 Minutes Intravenous Every 12 hours 08/25/21 0355     08/25/21 0400  ceFEPIme (MAXIPIME) 2 g in sodium chloride 0.9 % 100 mL IVPB        2 g 200 mL/hr over 30 Minutes Intravenous Every 12 hours 08/24/21 1649     08/24/21 1700  vancomycin (VANCOCIN) IVPB 1000 mg/200 mL premix        1,000 mg 200 mL/hr over 60 Minutes Intravenous  Once 08/24/21 1649 08/24/21 2008   08/24/21 1615  ceFEPIme (MAXIPIME) 2 g in sodium chloride 0.9 % 100 mL IVPB        2 g 200 mL/hr over 30 Minutes Intravenous  Once  08/24/21 1603 08/24/21 1705   08/24/21 1615  metroNIDAZOLE (FLAGYL) IVPB 500 mg        500 mg 100 mL/hr over 60 Minutes Intravenous  Once 08/24/21 1603 08/24/21 2007   08/24/21 1615  vancomycin (VANCOCIN) IVPB 1000 mg/200 mL premix        1,000 mg 200 mL/hr over 60 Minutes Intravenous  Once 08/24/21 1603 08/24/21 2007       Subjective: Patient seen and evaluated today with no new acute complaints or concerns. No acute concerns or  events noted overnight.  He is no longer on norepinephrine, but does not have an appetite or eat very much.  LFTs are elevating.  Objective: Vitals:   08/27/21 0700 08/27/21 0800 08/27/21 0813 08/27/21 0829  BP: (!) 99/50 (!) 87/63    Pulse: 98     Resp: 14     Temp:   97.6 F (36.4 C)   TempSrc:   Axillary   SpO2: 98%   97%  Weight:      Height:        Intake/Output Summary (Last 24 hours) at 08/27/2021 0945 Last data filed at 08/27/2021 0805 Gross per 24 hour  Intake 1837.25 ml  Output 900 ml  Net 937.25 ml   Filed Weights   08/24/21 2340 08/26/21 0500 08/27/21 0400  Weight: 102.6 kg 106.3 kg 109.6 kg    Examination:  General exam: Appears somnolent, but arousable.  Confused. Respiratory system: Clear to auscultation. Respiratory effort normal.  On nasal cannula oxygen Cardiovascular system: S1 & S2 heard, RRR.  Gastrointestinal system: Abdomen is soft Central nervous system: Somnolent but arousable Extremities: No edema Skin: No significant lesions noted Psychiatry: Flat affect.    Data Reviewed: I have personally reviewed following labs and imaging studies  CBC: Recent Labs  Lab 08/24/21 1602 08/24/21 1603 08/25/21 0237 08/26/21 0448 08/27/21 0243  WBC 8.3  --  6.9 10.1 9.1  NEUTROABS 6.8  --   --   --   --   HGB 8.8* 13.9 7.9* 8.6* 8.3*  HCT 33.4* 41.0 29.4* 31.2* 30.1*  MCV 60.1*  --  60.0* 60.5* 60.2*  PLT 222  --  198 265 814   Basic Metabolic Panel: Recent Labs  Lab 08/24/21 1602 08/24/21 1603 08/25/21 0237 08/26/21 0448 08/27/21 0243  NA 134* 136 133* 133* 133*  K 4.9 5.0 4.6 5.3* 4.6  CL 97* 99 100 99 100  CO2 27  --  27 26 26   GLUCOSE 112* 106* 133* 87 85  BUN 21 21 26* 39* 38*  CREATININE 1.71* 1.50* 1.77* 1.82* 1.51*  CALCIUM 8.2*  --  7.7* 7.9* 7.8*  MG  --   --   --  1.8 1.9   GFR: Estimated Creatinine Clearance: 53.1 mL/min (A) (by C-G formula based on SCr of 1.51 mg/dL (H)). Liver Function Tests: Recent Labs  Lab  08/24/21 1602 08/25/21 0237 08/26/21 0448 08/27/21 0243  AST 53* 124* 292* 413*  ALT 50* 73* 146* 207*  ALKPHOS 107 95 100 103  BILITOT 1.9* 1.6* 1.7* 1.6*  PROT 7.4 6.4* 6.8 6.5  ALBUMIN 3.1* 2.8* 2.9* 2.7*   No results for input(s): LIPASE, AMYLASE in the last 168 hours. Recent Labs  Lab 08/24/21 1849 08/26/21 1205  AMMONIA 29 30   Coagulation Profile: Recent Labs  Lab 08/24/21 1602 08/25/21 0237  INR 2.0* 2.0*   Cardiac Enzymes: No results for input(s): CKTOTAL, CKMB, CKMBINDEX, TROPONINI in the last 168 hours. BNP (last  3 results) No results for input(s): PROBNP in the last 8760 hours. HbA1C: Recent Labs    08/24/21 2349  HGBA1C 6.3*   CBG: Recent Labs  Lab 08/26/21 1624 08/26/21 2013 08/27/21 0008 08/27/21 0426 08/27/21 0740  GLUCAP 87 111* 101* 87 74   Lipid Profile: No results for input(s): CHOL, HDL, LDLCALC, TRIG, CHOLHDL, LDLDIRECT in the last 72 hours. Thyroid Function Tests: Recent Labs    08/24/21 1610  TSH 1.380   Anemia Panel: No results for input(s): VITAMINB12, FOLATE, FERRITIN, TIBC, IRON, RETICCTPCT in the last 72 hours. Sepsis Labs: Recent Labs  Lab 08/24/21 1645 08/24/21 1810 08/24/21 2348 08/24/21 2349 08/25/21 0237 08/26/21 0448  PROCALCITON  --   --   --  0.27 0.28 0.32  LATICACIDVEN 7.3* 4.4* 2.5*  --  1.4  --     Recent Results (from the past 240 hour(s))  Blood Culture (routine x 2)     Status: None (Preliminary result)   Collection Time: 08/24/21  4:02 PM   Specimen: Right Antecubital; Blood  Result Value Ref Range Status   Specimen Description   Final    RIGHT ANTECUBITAL BOTTLES DRAWN AEROBIC AND ANAEROBIC   Special Requests Blood Culture adequate volume  Final   Culture   Final    NO GROWTH 2 DAYS Performed at [facility], [address] [place], [place] [address]    Report Status PENDING  Incomplete  Blood Culture (routine x 2)     Status: None (Preliminary result)   Collection Time: 08/24/21  4:07 PM    Specimen: BLOOD LEFT FOREARM  Result Value Ref Range Status   Specimen Description   Final    BLOOD LEFT FOREARM BOTTLES DRAWN AEROBIC AND ANAEROBIC   Special Requests   Final    Blood Culture results may not be optimal due to an excessive volume of blood received in culture bottles   Culture   Final    NO GROWTH 2 DAYS Performed at [facility], [address], [place], [place] [address]    Report Status PENDING  Incomplete  Resp Panel by RT-PCR (Flu A&B, Covid) Nasopharyngeal Swab     Status: None   Collection Time: 08/24/21  5:52 PM   Specimen: Nasopharyngeal Swab; Nasopharyngeal(NP) swabs in vial transport medium  Result Value Ref Range Status   SARS Coronavirus 2 by RT PCR NEGATIVE NEGATIVE Final    Comment: (NOTE) SARS-CoV-2 target nucleic acids are NOT DETECTED.  The SARS-CoV-2 RNA is generally detectable in upper respiratory specimens during the acute phase of infection. The lowest concentration of SARS-CoV-2 viral copies this assay can detect is 138 copies/mL. A negative result does not preclude SARS-Cov-2 infection and should not be used as the sole basis for treatment or other patient management decisions. A negative result may occur with  improper specimen collection/handling, submission of specimen other than nasopharyngeal swab, presence of viral mutation(s) within the areas targeted by this assay, and inadequate number of viral copies(<138 copies/mL). A negative result must be combined with clinical observations, patient history, and epidemiological information. The expected result is Negative.  Fact Sheet for Patients:  EntrepreneurPulse.com.au  Fact Sheet for Healthcare Providers:  IncredibleEmployment.be  This test is no t yet approved or cleared by the [place] FDA and  has been authorized for detection and/or diagnosis of SARS-CoV-2 by FDA under an Emergency Use Authorization (EUA). This EUA will remain  in effect  (meaning this test can be used) for the duration of the COVID-19  declaration under Section 564(b)(1) of the Act, 21 U.S.C.section 360bbb-3(b)(1), unless the authorization is terminated  or revoked sooner.       Influenza A by PCR NEGATIVE NEGATIVE Final   Influenza B by PCR NEGATIVE NEGATIVE Final    Comment: (NOTE) The Xpert Xpress SARS-CoV-2/FLU/RSV plus assay is intended as an aid in the diagnosis of influenza from Nasopharyngeal swab specimens and should not be used as a sole basis for treatment. Nasal washings and aspirates are unacceptable for Xpert Xpress SARS-CoV-2/FLU/RSV testing.  Fact Sheet for Patients: EntrepreneurPulse.com.au  Fact Sheet for Healthcare Providers: IncredibleEmployment.be  This test is not yet approved or cleared by the [place] FDA and has been authorized for detection and/or diagnosis of SARS-CoV-2 by FDA under an Emergency Use Authorization (EUA). This EUA will remain in effect (meaning this test can be used) for the duration of the COVID-19 declaration under Section 564(b)(1) of the Act, 21 U.S.C. section 360bbb-3(b)(1), unless the authorization is terminated or revoked.  Performed at [facility], [address], [place], [place] [address]   MRSA Next Gen by PCR, Nasal     Status: Abnormal   Collection Time: 08/24/21  8:20 PM   Specimen: Nasal Mucosa; Nasal Swab  Result Value Ref Range Status   MRSA by PCR Next Gen DETECTED (A) NOT DETECTED Final    Comment: RESULT CALLED TO, READ BACK BY AND VERIFIED WITH: TINAJERO,E @ 4970 ON 08/25/21 BY JUW (NOTE) The GeneXpert MRSA Assay (FDA approved for NASAL specimens only), is one component of a comprehensive MRSA colonization surveillance program. It is not intended to diagnose MRSA infection nor to guide or monitor treatment for MRSA infections. Test performance is not FDA approved in patients less than 72 years old. Performed at [facility], [address], [place], [place] [address]   Urine Culture     Status: None   Collection Time: 08/25/21 12:28 AM   Specimen: In/Out Cath Urine  Result Value Ref Range Status   Specimen Description   Final    IN/OUT CATH URINE Performed at [facility], [address] [place], [place] [address]    Special Requests   Final    NONE Performed at [facility], [address], [place], [place] [address]    Culture   Final    NO GROWTH Performed at [facility], [place] [address], [place], [place] [address]    Report Status 08/26/2021 FINAL  Final         Radiology Studies: US Abdomen Limited RUQ (LIVER/GB)  Result Date: 08/26/2021 CLINICAL DATA:  Transaminitis. EXAM: ULTRASOUND ABDOMEN LIMITED RIGHT UPPER QUADRANT COMPARISON:  August 24, 2021.  August 27, 2020. FINDINGS: Gallbladder: No cholelithiasis is noted. Moderate to severe gallbladder wall thickening is noted measuring 7 mm. Mild ascites is noted. Common bile duct: Diameter: 6 mm which is within normal limits. Liver: No focal lesion identified. Mildly increased echogenicity of hepatic parenchyma is noted suggesting hepatic steatosis or other diffuse hepatocellular disease. Portal vein is patent on color Doppler imaging with normal direction of blood flow towards the liver. Other: None. IMPRESSION: Mildly increased echogenicity of hepatic parenchyma is noted suggesting hepatic steatosis or other diffuse hepatocellular disease. Moderate to severe gallbladder wall thickening is noted without cholelithiasis. It is uncertain if this is due to cholecystitis or adjacent hepatocellular disease. Mild ascites is noted in the right upper quadrant. No definite biliary dilatation is noted. Electronically Signed   By: Marijo Conception M.D.   On:  08/26/2021 11:53        Scheduled Meds:  Chlorhexidine Gluconate Cloth  6 each Topical Q0600   digoxin  0.0625 mg Oral Daily   enoxaparin (LOVENOX) injection  100 mg Subcutaneous Q12H   finasteride  5 mg Oral  Daily   insulin aspart  0-15 Units Subcutaneous Q4H   insulin detemir  20 Units Subcutaneous QHS   midodrine  15 mg Oral TID WC   mupirocin ointment  1 application Nasal BID   pantoprazole (PROTONIX) IV  40 mg Intravenous Q12H   sucralfate  1 g Oral QID   traZODone  50 mg Oral QHS   umeclidinium bromide  1 puff Inhalation Daily   Continuous Infusions:  sodium chloride     sodium chloride     ceFEPime (MAXIPIME) IV 2 g (08/27/21 0805)   diltiazem (CARDIZEM) infusion Stopped (08/25/21 0115)   metronidazole 500 mg (08/27/21 0407)   norepinephrine (LEVOPHED) Adult infusion Stopped (08/26/21 1043)   vancomycin 1,250 mg (08/26/21 1804)     LOS: 3 days    Time spent: 35 minutes    Cathie Bonnell D Manuella Ghazi, DO [facility]  If 7PM-7AM, please contact night-coverage [email or web address] 08/27/2021, 9:45 AM

## 2021-08-27 NOTE — Plan of Care (Signed)

## 2021-08-27 NOTE — TOC Initial Note (Signed)
Transition of Care [facility]) - Initial/Assessment Note    Patient Details  Name: Perry Hunter MRN: [id number] Date of Birth: Dec 23, 1950  Transition of Care [facility]) CM/SW Contact:    Salome Arnt, [place] Phone Number: 08/27/2021, 3:39 PM  Clinical Narrative:  Pt admitted due to sepsis. LCSW completed assessment with pt's daughter and granddaughter as pt not in room. Pt lives with his daughter, granddaughter and her husband, and great grandchildren. He is married, but they do not live together. Pt is independent at baseline with ADLs. He uses 2L home O2 PRN, but family is not sure what agency it is through. Discussed PT evaluation and recommendation for HHPT. Family is agreeable and pt has used [facility] in the past. They request [facility] again. Referred and accepted by Vaughan Basta with [facility] for HHPT and RN. Orders needed. Family report they will be with pt around the clock at home.                   Expected Discharge Plan: [place] Barriers to Discharge: Continued Medical Work up   Patient Goals and CMS Choice Patient states their goals for this hospitalization and ongoing recovery are:: return home   Choice offered to / list presented to : Adult Children  Expected Discharge Plan and Services Expected Discharge Plan: [place] In-house Referral: Clinical Social Work   Post Acute Care Choice: [place] arrangements for the past 2 months: [place]: RN, PT [place] Agency: [place] ([name]) Date [place]: 08/27/21 Time [place]: 1538 Representative spoke with at [place]: [place] Arrangements/Services Living arrangements for the past 2 months: [place] Lives with:: Adult Children, Relatives Patient language and need for interpreter reviewed:: Yes Do you feel safe going back to the place where you live?: Yes      Need for Family  Participation in Patient Care: Yes (Comment) Care giver support system in place?: Yes (comment) Current home services: DME (walker, O2) Criminal Activity/Legal Involvement Pertinent to Current Situation/Hospitalization: No - Comment as needed  Activities of Daily Living Home Assistive Devices/Equipment: Oxygen (Home 3 liters O2) ADL Screening (condition at time of admission) Patient's cognitive ability adequate to safely complete daily activities?: Yes Is the patient deaf or have difficulty hearing?: Yes Does the patient have difficulty seeing, even when wearing glasses/contacts?: No Does the patient have difficulty concentrating, remembering, or making decisions?: No Patient able to express need for assistance with ADLs?: Yes Does the patient have difficulty dressing or bathing?: No Independently performs ADLs?: Yes (appropriate for developmental age) Does the patient have difficulty walking or climbing stairs?: Yes Weakness of Legs: Both Weakness of Arms/Hands: None  Permission Sought/Granted                  Emotional Assessment         Alcohol / Substance Use: Not Applicable Psych Involvement: No (comment)  Admission diagnosis:  Weakness [R53.1] Sepsis ([place]) [A41.9] Sepsis, due to unspecified organism, unspecified whether acute organ dysfunction present [facility]) [A41.9] Patient Active Problem List   Diagnosis Date Noted   Sepsis ([place]) 08/24/2021   Elevated LFTs 08/24/2021   AKI (acute kidney injury) ([place]) 11/94/1740   Acute metabolic encephalopathy 81/44/8185   Persistent atrial fibrillation ([place])  PAF (paroxysmal atrial fibrillation) (HCC)    Acute on chronic combined systolic and diastolic CHF (congestive heart failure) ([place])    Cardiogenic shock ([place]) 08/26/2020   GI bleed 08/25/2020   Symptomatic anemia 08/25/2020   Microcytic anemia 08/25/2020   Elevated brain natriuretic peptide (BNP) level 08/25/2020   Hypotension 08/25/2020   Hyponatremia 08/25/2020    Chronic kidney disease 08/25/2020   Essential hypertension 08/25/2020   Hyperlipidemia 08/25/2020   GERD (gastroesophageal reflux disease) 08/25/2020   COPD (chronic obstructive pulmonary disease) ([place]) 08/25/2020   Diabetes mellitus ([place]) 08/25/2020   Obesity 08/25/2020   PCP:  Neale Burly, MD Pharmacy:   [facility] [place] [address] Phone: [phone] Fax: [phone]     Social Determinants of Health (SDOH) Interventions    Readmission Risk Interventions Readmission Risk Prevention Plan 08/27/2021  Transportation Screening Complete  Medication Review ([place]) Complete  HRI or [place] Complete  SW Recovery Care/Counseling Consult Complete  Palliative Care Screening Not [place] Not Applicable  Some recent data might be hidden

## 2021-08-27 NOTE — Plan of Care (Signed)
°  Problem: Acute Rehab PT Goals(only PT should resolve) Goal: Pt Will Go Supine/Side To Sit Outcome: Progressing Flowsheets (Taken 08/27/2021 1554) Pt will go Supine/Side to Sit: with modified independence Goal: Patient Will Transfer Sit To/From Stand Outcome: Progressing Flowsheets (Taken 08/27/2021 1554) Patient will transfer sit to/from stand: with modified independence Goal: Pt Will Transfer Bed To Chair/Chair To Bed Outcome: Progressing Flowsheets (Taken 08/27/2021 1554) Pt will Transfer Bed to Chair/Chair to Bed:  with supervision  min guard assist Goal: Pt Will Ambulate Outcome: Progressing Flowsheets (Taken 08/27/2021 1554) Pt will Ambulate:  50 feet  with min guard assist  with rolling walker   3:55 PM, 08/27/21 Lonell Grandchild, MPT Physical Therapist with [facility] [phone] office [phone] mobile phone

## 2021-08-27 NOTE — Progress Notes (Signed)
Pharmacy Antibiotic Note  Perry Hunter a 71 y.o. male admitted on 08/27/2021 with sepsis.  Pharmacy has been consulted for vancomycin and cefepime dosing. Ordering Vancomycin  trough level for continued dosing Septic shock present on admission-unknown source.  Possibly related to cholecystitis. Plan to do HIDA scan.   Vancomycin random level ordered by MD at 0923 is 51mcg/ml. F/U with Vanc Trough prior to 1600 dose.  Plan: Continue Vancomycin 1250mg  IV every 24 hours.  Goal trough 15-20 mcg/mL. Cefepime 2gm IV every 12 hours. F/U cxs and clinical progress Monitor V/S, labs and levels as indicated F/U with Vanc Trough prior to 1600 dose.  Medical History: Past Medical History:  Diagnosis Date   Arthritis    Asthma    CHF (congestive heart failure) (HCC)    Diabetes mellitus without complication (HCC)    GERD (gastroesophageal reflux disease)    Hypercholesteremia    Hypertension     Allergies:  No Known Allergies  Filed Weights   08/24/21 2340 08/26/21 0500 08/27/21 0400  Weight: 102.6 kg (226 lb 3.1 oz) 106.3 kg (234 lb 5.6 oz) 109.6 kg (241 lb 10 oz)    CBC Latest Ref Rng & Units 08/27/2021 08/26/2021 08/25/2021  WBC 4.0 - 10.5 K/uL 9.1 10.1 6.9  Hemoglobin 13.0 - 17.0 g/dL 8.3(L) 8.6(L) 7.9(L)  Hematocrit 39.0 - 52.0 % 30.1(L) 31.2(L) 29.4(L)  Platelets 150 - 400 K/uL 290 265 198     Estimated Creatinine Clearance: 53.1 mL/min (A) (by C-G formula based on SCr of 1.51 mg/dL (H)).  Antibiotics Given (last 72 hours)     Date/Time Action Medication Dose Rate   08/24/21 1614 New Bag/Given   ceFEPIme (MAXIPIME) 2 g in sodium chloride 0.9 % 100 mL IVPB 2 g 200 mL/hr   08/24/21 1628 New Bag/Given   metroNIDAZOLE (FLAGYL) IVPB 500 mg 500 mg 100 mL/hr   08/24/21 1650 New Bag/Given   vancomycin (VANCOCIN) IVPB 1000 mg/200 mL premix 1,000 mg 200 mL/hr   08/24/21 1747 New Bag/Given   vancomycin (VANCOCIN) IVPB 1000 mg/200 mL premix 1,000 mg 200 mL/hr   08/25/21 0425 New  Bag/Given   ceFEPIme (MAXIPIME) 2 g in sodium chloride 0.9 % 100 mL IVPB 2 g 200 mL/hr   08/25/21 0550 New Bag/Given   metroNIDAZOLE (FLAGYL) IVPB 500 mg 500 mg 100 mL/hr   08/25/21 1446 New Bag/Given   metroNIDAZOLE (FLAGYL) IVPB 500 mg 500 mg 100 mL/hr   08/25/21 1721 New Bag/Given   vancomycin (VANCOREADY) IVPB 1250 mg/250 mL 1,250 mg 166.7 mL/hr   08/25/21 2159 New Bag/Given   ceFEPIme (MAXIPIME) 2 g in sodium chloride 0.9 % 100 mL IVPB 2 g 200 mL/hr   08/26/21 0433 New Bag/Given   metroNIDAZOLE (FLAGYL) IVPB 500 mg 500 mg 100 mL/hr   08/26/21 0936 New Bag/Given   ceFEPIme (MAXIPIME) 2 g in sodium chloride 0.9 % 100 mL IVPB 2 g 200 mL/hr   08/26/21 1648 New Bag/Given   metroNIDAZOLE (FLAGYL) IVPB 500 mg 500 mg 100 mL/hr   08/26/21 1804 New Bag/Given   vancomycin (VANCOREADY) IVPB 1250 mg/250 mL 1,250 mg 166.7 mL/hr   08/26/21 2130 New Bag/Given   ceFEPIme (MAXIPIME) 2 g in sodium chloride 0.9 % 100 mL IVPB 2 g 200 mL/hr   08/27/21 0407 New Bag/Given   metroNIDAZOLE (FLAGYL) IVPB 500 mg 500 mg 100 mL/hr   08/27/21 0805 New Bag/Given   ceFEPIme (MAXIPIME) 2 g in sodium chloride 0.9 % 100 mL IVPB 2 g 200 mL/hr  Antimicrobials this admission:  Cefepime 08/24/2021  >>  vancomycin 08/24/2021  >>  Metronidazole 08/24/2021   x 1   Microbiology results: 08/24/2021  BCx: ngtd 08/24/2021  UCx: no growth 08/24/2021  Resp Panel: neg 08/24/2021  MRSA PCR: positive   Thank you for allowing pharmacy to be a part of this patients care.  Isac Sarna, BS [place], BCPS Clinical Pharmacist Pager [phone]

## 2021-08-27 NOTE — Progress Notes (Signed)
Pharmacy Antibiotic Note  Perry Hunter a 71 y.o. male admitted on 08/27/2021 with sepsis.  Pharmacy has been consulted for vancomycin and cefepime dosing. Ordering Vancomycin  trough level for continued dosing Septic shock present on admission-unknown source.  Possibly related to cholecystitis. Plan to do HIDA scan.   Vancomycin random level ordered by MD at 0923 is 4mcg/ml.   Vanc Trough prior to 1600 dose - was 18 which is within the desired goal range of 15 - 20.  Plan: Continue Vancomycin 1250mg  IV every 24 hours.  Goal trough 15-20 mcg/mL. Cefepime 2gm IV every 12 hours. F/U cxs and clinical progress Monitor V/S, labs and levels as indicated  Medical History: Past Medical History:  Diagnosis Date   Arthritis    Asthma    CHF (congestive heart failure) (HCC)    Diabetes mellitus without complication (HCC)    GERD (gastroesophageal reflux disease)    Hypercholesteremia    Hypertension     Allergies:  No Known Allergies  Filed Weights   08/24/21 2340 08/26/21 0500 08/27/21 0400  Weight: 102.6 kg (226 lb 3.1 oz) 106.3 kg (234 lb 5.6 oz) 109.6 kg (241 lb 10 oz)    CBC Latest Ref Rng & Units 08/27/2021 08/26/2021 08/25/2021  WBC 4.0 - 10.5 K/uL 9.1 10.1 6.9  Hemoglobin 13.0 - 17.0 g/dL 8.3(L) 8.6(L) 7.9(L)  Hematocrit 39.0 - 52.0 % 30.1(L) 31.2(L) 29.4(L)  Platelets 150 - 400 K/uL 290 265 198     Estimated Creatinine Clearance: 53.1 mL/min (A) (by C-G formula based on SCr of 1.51 mg/dL (H)).  Antibiotics Given (last 72 hours)     Date/Time Action Medication Dose Rate   08/25/21 0425 New Bag/Given   ceFEPIme (MAXIPIME) 2 g in sodium chloride 0.9 % 100 mL IVPB 2 g 200 mL/hr   08/25/21 0550 New Bag/Given   metroNIDAZOLE (FLAGYL) IVPB 500 mg 500 mg 100 mL/hr   08/25/21 1446 New Bag/Given   metroNIDAZOLE (FLAGYL) IVPB 500 mg 500 mg 100 mL/hr   08/25/21 1721 New Bag/Given   vancomycin (VANCOREADY) IVPB 1250 mg/250 mL 1,250 mg 166.7 mL/hr   08/25/21 2159 New Bag/Given    ceFEPIme (MAXIPIME) 2 g in sodium chloride 0.9 % 100 mL IVPB 2 g 200 mL/hr   08/26/21 0433 New Bag/Given   metroNIDAZOLE (FLAGYL) IVPB 500 mg 500 mg 100 mL/hr   08/26/21 0936 New Bag/Given   ceFEPIme (MAXIPIME) 2 g in sodium chloride 0.9 % 100 mL IVPB 2 g 200 mL/hr   08/26/21 1648 New Bag/Given   metroNIDAZOLE (FLAGYL) IVPB 500 mg 500 mg 100 mL/hr   08/26/21 1804 New Bag/Given   vancomycin (VANCOREADY) IVPB 1250 mg/250 mL 1,250 mg 166.7 mL/hr   08/26/21 2130 New Bag/Given   ceFEPIme (MAXIPIME) 2 g in sodium chloride 0.9 % 100 mL IVPB 2 g 200 mL/hr   08/27/21 0407 New Bag/Given   metroNIDAZOLE (FLAGYL) IVPB 500 mg 500 mg 100 mL/hr   08/27/21 0805 New Bag/Given   ceFEPIme (MAXIPIME) 2 g in sodium chloride 0.9 % 100 mL IVPB 2 g 200 mL/hr   08/27/21 1641 New Bag/Given   metroNIDAZOLE (FLAGYL) IVPB 500 mg 500 mg 100 mL/hr   08/27/21 1816 New Bag/Given   vancomycin (VANCOREADY) IVPB 1250 mg/250 mL 1,250 mg 166.7 mL/hr       Antimicrobials this admission:  Cefepime 08/24/2021  >>  vancomycin 08/24/2021  >>  Metronidazole 08/24/2021   x 1   Microbiology results: 08/24/2021  BCx: ngtd 08/24/2021  UCx: no  growth 08/24/2021  Resp Panel: neg 08/24/2021  MRSA PCR: positive   Thank you for allowing pharmacy to be a part of this patients care.  Alanda Slim, PharmD, [facility] Clinical Pharmacist Please see AMION for all Pharmacists' Contact Phone Numbers 08/27/2021, 6:26 PM

## 2021-08-27 NOTE — Consult Note (Signed)
[facility] Consult  Reason for Consult: ? Cholecystitis  Referring Physician:  Dr. Manuella Ghazi   Chief Complaint   Weakness     HPI: Perry Hunter is a 71 y.o. male with CHF EF 20%, DM, COPD, A fib on anticoagulation, CPAP use who was admitted with SOB, fever and confusion. He had sepsis of unknown etiology and was on pressors for a period of time.  He had some concern for a dental infection a week ago and was prescribed Augmentin.  He has had worsening LFTs during the admission and [place] with hepatic steatosis and some concern for thickened gallbladder but no stones.   I was consulted to evaluate for any cholecystitis. I recommended that Dr. Manuella Ghazi obtain a [place] scan when he consulted me.   Patient currently says he has no pain. He says when he did have abdominal pain it was in the left side/ upper abdomen.   Past Medical History:  Diagnosis Date   Arthritis    Asthma    CHF (congestive heart failure) ([place])    Diabetes mellitus without complication (HCC)    GERD (gastroesophageal reflux disease)    Hypercholesteremia    Hypertension     Past Surgical History:  Procedure Laterality Date   BIOPSY  08/29/2020   Procedure: BIOPSY;  Surgeon: Rush Landmark, Telford Nab., MD;  Location: [place];  Service: Gastroenterology;;   CARDIOVERSION N/A 09/07/2020   Procedure: CARDIOVERSION;  Surgeon: Jolaine Artist, MD;  Location: [facility] ENDOSCOPY;  Service: Cardiovascular;  Laterality: N/A;   ESOPHAGOGASTRODUODENOSCOPY (EGD) WITH PROPOFOL N/A 08/29/2020   Procedure: ESOPHAGOGASTRODUODENOSCOPY (EGD) WITH PROPOFOL;  Surgeon: Irving Copas., MD;  Location: [place];  Service: Gastroenterology;  Laterality: N/A;   RIGHT/LEFT HEART CATH AND CORONARY ANGIOGRAPHY N/A 08/31/2020   Procedure: RIGHT/LEFT HEART CATH AND CORONARY ANGIOGRAPHY;  Surgeon: Jolaine Artist, MD;  Location: [place] [facility];  Service: Cardiovascular;  Laterality: N/A;   TEE WITHOUT CARDIOVERSION N/A  09/07/2020   Procedure: TRANSESOPHAGEAL ECHOCARDIOGRAM (TEE);  Surgeon: Jolaine Artist, MD;  Location: [facility] ENDOSCOPY;  Service: Cardiovascular;  Laterality: N/A;    History reviewed. No pertinent family history.  Social History   Tobacco Use   Smoking status: Every Day    Types: Cigarettes   Smokeless tobacco: Never  Substance Use Topics   Alcohol use: Not Currently   Drug use: Never    Medications: I have reviewed the patient's current medications. Prior to Admission:  Medications Prior to Admission  Medication Sig Dispense Refill Last Dose   acetaminophen (TYLENOL) 650 MG CR tablet Take 1,300 mg by mouth daily.   unknown   Cholecalciferol (VITAMIN D3) 1000 units CAPS Take 1 capsule by mouth daily.   Past Week   digoxin 62.5 MCG TABS Take 0.0625 mg by mouth daily. 30 tablet 1 Past Week   ELIQUIS 5 MG TABS tablet Take 5 mg by mouth 2 (two) times daily.   Past Week at unknown   ferrous sulfate 325 (65 FE) MG tablet Take 1 tablet (325 mg total) by mouth daily. 30 tablet 1 Past Week   finasteride (PROSCAR) 5 MG tablet Take 1 tablet (5 mg total) by mouth daily. 90 tablet 3 Past Week   fluticasone furoate-vilanterol (BREO ELLIPTA) 200-25 MCG/INH AEPB Inhale 1 puff into the lungs daily. 60 each 0 Past Week   folic acid (FOLVITE) 1 MG tablet Take 1 tablet (1 mg total) by mouth daily. 30 tablet 1 Past Week   furosemide (LASIX) 40 MG tablet  TAKE (1) TABLET TWICE DAILY. 30 tablet 0 Past Week   HYDROcodone-acetaminophen (NORCO/VICODIN) 5-325 MG tablet Take 1 tablet by mouth 3 (three) times daily as needed for pain.   Past Week   insulin detemir (LEVEMIR) 100 UNIT/ML injection Inject 40 Units into the skin at bedtime.   Past Month   metFORMIN (GLUCOPHAGE) 500 MG tablet Take 1,000 mg by mouth daily with breakfast.   Past Week   midodrine (PROAMATINE) 5 MG tablet Take 3 tablets (15 mg total) by mouth 3 (three) times daily with meals. 270 tablet 1 Past Week   Omega-3 Fatty Acids (FISH OIL) 1000  MG CAPS Take 1 capsule by mouth daily.   Past Week   ondansetron (ZOFRAN) 4 MG tablet Take by mouth daily.   unknown   pantoprazole (PROTONIX) 40 MG tablet Take 1 tablet (40 mg total) by mouth 2 (two) times daily. 60 tablet 1 Past Week   potassium chloride SA (KLOR-CON) 20 MEQ tablet Take 2 tablets (40 mEq total) by mouth daily. 30 tablet 1 Past Week   PROAIR HFA 108 (90 Base) MCG/ACT inhaler Inhale 2 puffs into the lungs 4 (four) times daily as needed for shortness of breath.   Past Week   rosuvastatin (CRESTOR) 20 MG tablet Take 1 tablet (20 mg total) by mouth daily. 30 tablet 1 Past Week   sitaGLIPtin (JANUVIA) 100 MG tablet Take 100 mg by mouth daily.   Past Week   sucralfate (CARAFATE) 1 GM/10ML suspension Take 10 mLs (1 g total) by mouth 4 (four) times daily for 16 days. 640 mL 0 unknown   tamsulosin (FLOMAX) 0.4 MG CAPS capsule Take 0.8 mg by mouth every evening.   Past Week   thiamine 100 MG tablet Take 1 tablet (100 mg total) by mouth daily. 30 tablet 1 Past Week   umeclidinium bromide (INCRUSE ELLIPTA) 62.5 MCG/INH AEPB Inhale 1 puff into the lungs daily. 30 each 1 Past Week   amoxicillin-clavulanate (AUGMENTIN) 875-125 MG tablet Take 1 tablet by mouth 2 (two) times daily. (Patient not taking: Reported on 08/25/2021) 20 tablet 0 Not Taking   Scheduled:  Chlorhexidine Gluconate Cloth  6 each Topical Q0600   digoxin  0.0625 mg Oral Daily   enoxaparin (LOVENOX) injection  100 mg Subcutaneous Q12H   finasteride  5 mg Oral Daily   insulin aspart  0-15 Units Subcutaneous Q4H   insulin detemir  20 Units Subcutaneous QHS   midodrine  15 mg Oral TID WC   mupirocin ointment  1 application Nasal BID   pantoprazole (PROTONIX) IV  40 mg Intravenous Q12H   sucralfate  1 g Oral QID   traZODone  50 mg Oral QHS   umeclidinium bromide  1 puff Inhalation Daily   Continuous:  sodium chloride     sodium chloride 75 mL/hr at 08/27/21 1246   ceFEPime (MAXIPIME) IV 2 g (08/27/21 0805)   diltiazem  (CARDIZEM) infusion Stopped (08/25/21 0115)   metronidazole 500 mg (08/27/21 0407)   norepinephrine (LEVOPHED) Adult infusion Stopped (08/26/21 1043)   vancomycin 1,250 mg (08/26/21 1804)   TML:YYTKPTWSF, HYDROcodone-acetaminophen, LORazepam, ondansetron **OR** ondansetron (ZOFRAN) IV  No Known Allergies   ROS:  A comprehensive review of systems was negative except for: Respiratory: positive for SOB  Blood pressure 102/71, pulse 77, temperature 97.8 F (36.6 C), temperature source Oral, resp. rate 18, height 5\' 8"  (1.727 m), weight 109.6 kg, SpO2 98 %. Physical Exam Vitals reviewed.  Constitutional:      Appearance: Normal  appearance.  HENT:     Head: Atraumatic.     Nose: Nose normal.  Eyes:     Pupils: Pupils are equal, round, and reactive to light.  Cardiovascular:     Rate and Rhythm: Normal rate.  Pulmonary:     Effort: Pulmonary effort is normal.  Abdominal:     General: There is distension.     Palpations: Abdomen is soft.     Tenderness: There is no abdominal tenderness. There is no guarding or rebound.  Skin:    General: Skin is warm.  Neurological:     General: No focal deficit present.     Mental Status: He is alert.  Psychiatric:        Mood and Affect: Mood normal.    Results: Results for orders placed or performed during the hospital encounter of 08/24/21 (from the past 48 hour(s))  Glucose, capillary     Status: Abnormal   Collection Time: 08/25/21  4:19 PM  Result Value Ref Range   Glucose-Capillary 59 (L) 70 - 99 mg/dL    Comment: Glucose reference range applies only to samples taken after fasting for at least 8 hours.   Comment 1 Notify RN   Glucose, capillary     Status: None   Collection Time: 08/25/21  8:01 PM  Result Value Ref Range   Glucose-Capillary 70 70 - 99 mg/dL    Comment: Glucose reference range applies only to samples taken after fasting for at least 8 hours.   Comment 1 Notify RN    Comment 2 Document in Chart   Glucose,  capillary     Status: None   Collection Time: 08/26/21 12:23 AM  Result Value Ref Range   Glucose-Capillary 92 70 - 99 mg/dL    Comment: Glucose reference range applies only to samples taken after fasting for at least 8 hours.   Comment 1 Notify RN    Comment 2 Document in Chart   Glucose, capillary     Status: None   Collection Time: 08/26/21  4:18 AM  Result Value Ref Range   Glucose-Capillary 89 70 - 99 mg/dL    Comment: Glucose reference range applies only to samples taken after fasting for at least 8 hours.  Procalcitonin     Status: None   Collection Time: 08/26/21  4:48 AM  Result Value Ref Range   Procalcitonin 0.32 ng/mL    Comment:        Interpretation: PCT (Procalcitonin) <= 0.5 ng/mL: Systemic infection (sepsis) is not likely. Local bacterial infection is possible. (NOTE)       Sepsis PCT Algorithm           Lower Respiratory Tract                                      Infection PCT Algorithm    ----------------------------     ----------------------------         PCT < 0.25 ng/mL                PCT < 0.10 ng/mL          Strongly encourage             Strongly discourage   discontinuation of antibiotics    initiation of antibiotics    ----------------------------     -----------------------------       PCT 0.25 - 0.50 ng/mL  PCT 0.10 - 0.25 ng/mL               OR       >80% decrease in PCT            Discourage initiation of                                            antibiotics      Encourage discontinuation           of antibiotics    ----------------------------     -----------------------------         PCT >= 0.50 ng/mL              PCT 0.26 - 0.50 ng/mL               AND        <80% decrease in PCT             Encourage initiation of                                             antibiotics       Encourage continuation           of antibiotics    ----------------------------     -----------------------------        PCT >= 0.50 ng/mL                   PCT > 0.50 ng/mL               AND         increase in PCT                  Strongly encourage                                      initiation of antibiotics    Strongly encourage escalation           of antibiotics                                     -----------------------------                                           PCT <= 0.25 ng/mL                                                 OR                                        > 80% decrease in PCT  Discontinue / Do not initiate                                             antibiotics  Performed at [facility], [address], [place], [place] [address]   CBC     Status: Abnormal   Collection Time: 08/26/21  4:48 AM  Result Value Ref Range   WBC 10.1 4.0 - 10.5 K/uL   RBC 5.16 4.22 - 5.81 MIL/uL   Hemoglobin 8.6 (L) 13.0 - 17.0 g/dL    Comment: Reticulocyte Hemoglobin testing may be clinically indicated, consider ordering this additional test [id number]    HCT 31.2 (L) 39.0 - 52.0 %   MCV 60.5 (L) 80.0 - 100.0 fL   MCH 16.7 (L) 26.0 - 34.0 pg   MCHC 27.6 (L) 30.0 - 36.0 g/dL   RDW 23.8 (H) 11.5 - 15.5 %   Platelets 265 150 - 400 K/uL   nRBC 1.6 (H) 0.0 - 0.2 %    Comment: Performed at [facility], [address], [place], [place] [address]  Comprehensive metabolic panel     Status: Abnormal   Collection Time: 08/26/21  4:48 AM  Result Value Ref Range   Sodium 133 (L) 135 - 145 mmol/L   Potassium 5.3 (H) 3.5 - 5.1 mmol/L   Chloride 99 98 - 111 mmol/L   CO2 26 22 - 32 mmol/L   Glucose, Bld 87 70 - 99 mg/dL    Comment: Glucose reference range applies only to samples taken after fasting for at least 8 hours.   BUN 39 (H) 8 - 23 mg/dL   Creatinine, Ser 1.82 (H) 0.61 - 1.24 mg/dL   Calcium 7.9 (L) 8.9 - 10.3 mg/dL   Total Protein 6.8 6.5 - 8.1 g/dL   Albumin 2.9 (L) 3.5 - 5.0 g/dL   AST 292 (H) 15 - 41 U/L   ALT 146 (H) 0 - 44 U/L   Alkaline Phosphatase 100 38 - 126 U/L   Total  Bilirubin 1.7 (H) 0.3 - 1.2 mg/dL   GFR, Estimated 39 (L) >60 mL/min    Comment: (NOTE) Calculated using the CKD-EPI Creatinine Equation (2021)    Anion gap 8 5 - 15    Comment: Performed at [facility], [address] [place], [place] [address]  Magnesium     Status: None   Collection Time: 08/26/21  4:48 AM  Result Value Ref Range   Magnesium 1.8 1.7 - 2.4 mg/dL    Comment: Performed at [facility], [address] [place], [place] [address]  Glucose, capillary     Status: Abnormal   Collection Time: 08/26/21  7:29 AM  Result Value Ref Range   Glucose-Capillary 104 (H) 70 - 99 mg/dL    Comment: Glucose reference range applies only to samples taken after fasting for at least 8 hours.  Hepatitis panel, acute     Status: None   Collection Time: 08/26/21  8:46 AM  Result Value Ref Range   Hepatitis B Surface Ag NON REACTIVE NON REACTIVE   HCV Ab NON REACTIVE NON REACTIVE    Comment: (NOTE) Nonreactive HCV antibody screen is consistent with no HCV infections,  unless recent infection is suspected or other evidence exists to indicate HCV infection.     Hep A IgM NON REACTIVE NON REACTIVE   Hep B C IgM NON REACTIVE  NON REACTIVE    Comment: Performed at [facility], [place] [address], [place], [place] [address]  Blood gas, arterial     Status: Abnormal   Collection Time: 08/26/21 10:10 AM  Result Value Ref Range   FIO2 28.00    pH, Arterial 7.210 (L) 7.350 - 7.450   pCO2 arterial 65.1 (HH) 32.0 - 48.0 mmHg    Comment: CRITICAL RESULT CALLED TO, READ BACK BY AND VERIFIED WITH:  L.HILTON @ 1028 08/26/21 BY STEPHTR    pO2, Arterial 99.4 83.0 - 108.0 mmHg   Bicarbonate 22.2 20.0 - 28.0 mmol/L   Acid-base deficit 1.7 0.0 - 2.0 mmol/L   O2 Saturation 96.3 %   Patient temperature 36.5    Collection site RIGHT RADIAL    Drawn by [id number]    Sample type ARTERIAL    Allens test (pass/fail) PASS PASS    Comment: Performed at [facility], [address] [place], [place] [address]   Glucose, capillary     Status: Abnormal   Collection Time: 08/26/21 11:17 AM  Result Value Ref Range   Glucose-Capillary 107 (H) 70 - 99 mg/dL    Comment: Glucose reference range applies only to samples taken after fasting for at least 8 hours.  Ammonia     Status: None   Collection Time: 08/26/21 12:05 PM  Result Value Ref Range   Ammonia 30 9 - 35 umol/L    Comment: Performed at [facility], [address] [place],  [address]  Glucose, capillary     Status: None   Collection Time: 08/26/21  4:24 PM  Result Value Ref Range   Glucose-Capillary 87 70 - 99 mg/dL    Comment: Glucose reference range applies only to samples taken after fasting for at least 8 hours.  Glucose, capillary     Status: Abnormal   Collection Time: 08/26/21  8:13 PM  Result Value Ref Range   Glucose-Capillary 111 (H) 70 - 99 mg/dL    Comment: Glucose reference range applies only to samples taken after fasting for at least 8 hours.  Glucose, capillary     Status: Abnormal   Collection Time: 08/27/21 12:08 AM  Result Value Ref Range   Glucose-Capillary 101 (H) 70 - 99 mg/dL    Comment: Glucose reference range applies only to samples taken after fasting for at least 8 hours.  Comprehensive metabolic panel     Status: Abnormal   Collection Time: 08/27/21  2:43 AM  Result Value Ref Range   Sodium 133 (L) 135 - 145 mmol/L   Potassium 4.6 3.5 - 5.1 mmol/L   Chloride 100 98 - 111 mmol/L   CO2 26 22 - 32 mmol/L   Glucose, Bld 85 70 - 99 mg/dL    Comment: Glucose reference range applies only to samples taken after fasting for at least 8 hours.   BUN 38 (H) 8 - 23 mg/dL   Creatinine, Ser 1.51 (H) 0.61 - 1.24 mg/dL   Calcium 7.8 (L) 8.9 - 10.3 mg/dL   Total Protein 6.5 6.5 - 8.1 g/dL   Albumin 2.7 (L) 3.5 - 5.0 g/dL   AST 413 (H) 15 - 41 U/L   ALT 207 (H) 0 - 44 U/L   Alkaline Phosphatase 103 38 - 126 U/L   Total Bilirubin 1.6 (H) 0.3 - 1.2 mg/dL   GFR, Estimated 49 (L) >60 mL/min    Comment:  (NOTE) Calculated using the CKD-EPI Creatinine Equation (2021)    Anion gap  7 5 - 15    Comment: Performed at [facility], [address] [place], [place] [address]  CBC     Status: Abnormal   Collection Time: 08/27/21  2:43 AM  Result Value Ref Range   WBC 9.1 4.0 - 10.5 K/uL   RBC 5.00 4.22 - 5.81 MIL/uL   Hemoglobin 8.3 (L) 13.0 - 17.0 g/dL    Comment: Reticulocyte Hemoglobin testing may be clinically indicated, consider ordering this additional test [id number]    HCT 30.1 (L) 39.0 - 52.0 %   MCV 60.2 (L) 80.0 - 100.0 fL   MCH 16.6 (L) 26.0 - 34.0 pg   MCHC 27.6 (L) 30.0 - 36.0 g/dL   RDW 23.6 (H) 11.5 - 15.5 %   Platelets 290 150 - 400 K/uL   nRBC 1.0 (H) 0.0 - 0.2 %    Comment: Performed at [facility], [address], [place], [place] [address]  Magnesium     Status: None   Collection Time: 08/27/21  2:43 AM  Result Value Ref Range   Magnesium 1.9 1.7 - 2.4 mg/dL    Comment: Performed at [facility], [address], [place], [place] [address]  Glucose, capillary     Status: None   Collection Time: 08/27/21  4:26 AM  Result Value Ref Range   Glucose-Capillary 87 70 - 99 mg/dL    Comment: Glucose reference range applies only to samples taken after fasting for at least 8 hours.  Glucose, capillary     Status: None   Collection Time: 08/27/21  7:40 AM  Result Value Ref Range   Glucose-Capillary 74 70 - 99 mg/dL    Comment: Glucose reference range applies only to samples taken after fasting for at least 8 hours.  Vancomycin, trough     Status: Abnormal   Collection Time: 08/27/21  9:23 AM  Result Value Ref Range   Vancomycin Tr 22 (HH) 15 - 20 ug/mL    Comment: CRITICAL RESULT CALLED TO, READ BACK BY AND VERIFIED WITH: SHELTON,A AT 10:05AM ON 08/27/21 BY [facility] Performed at [facility], [address], [place], [place] [address]   Glucose, capillary     Status: None   Collection Time: 08/27/21 11:24 AM  Result Value Ref Range   Glucose-Capillary 91 70 - 99 mg/dL     Comment: Glucose reference range applies only to samples taken after fasting for at least 8 hours.   Personally reviewed CT and [place]- no stones, some gallbladder distention US Abdomen Limited RUQ (LIVER/GB)  Result Date: 08/26/2021 CLINICAL DATA:  Transaminitis. EXAM: ULTRASOUND ABDOMEN LIMITED RIGHT UPPER QUADRANT COMPARISON:  August 24, 2021.  August 27, 2020. FINDINGS: Gallbladder: No cholelithiasis is noted. Moderate to severe gallbladder wall thickening is noted measuring 7 mm. Mild ascites is noted. Common bile duct: Diameter: 6 mm which is within normal limits. Liver: No focal lesion identified. Mildly increased echogenicity of hepatic parenchyma is noted suggesting hepatic steatosis or other diffuse hepatocellular disease. Portal vein is patent on color Doppler imaging with normal direction of blood flow towards the liver. Other: None. IMPRESSION: Mildly increased echogenicity of hepatic parenchyma is noted suggesting hepatic steatosis or other diffuse hepatocellular disease. Moderate to severe gallbladder wall thickening is noted without cholelithiasis. It is uncertain if this is due to cholecystitis or adjacent hepatocellular disease. Mild ascites is noted in the right upper quadrant. No definite biliary dilatation is noted. Electronically Signed   By: Marijo Conception M.D.   On: 08/26/2021 11:53  Assessment & Plan:  Silvano Garofano is a 71 y.o. male with elevated LFTs but alkaline phosphatase is normal, concern for fatty liver on [place], and CHF with EF 20% which likely means thickening is related to some hepatic congestion from the heart failure. HIDA recommended this AM. This [facility] at [facility].   HIDA will determine if cholecystitis or not  He is not an operative candidate regardless because of cardiac disease and current health issues, recent sepsis etc   Discussed with Dr. Manuella Ghazi and RN.    Virl Cagey 08/27/2021, 1:41 PM

## 2021-08-28 ENCOUNTER — Other Ambulatory Visit (HOSPITAL_COMMUNITY): Payer: Self-pay | Admitting: *Deleted

## 2021-08-28 ENCOUNTER — Inpatient Hospital Stay (HOSPITAL_COMMUNITY): Payer: Medicare Other

## 2021-08-28 DIAGNOSIS — I5023 Acute on chronic systolic (congestive) heart failure: Secondary | ICD-10-CM

## 2021-08-28 DIAGNOSIS — R579 Shock, unspecified: Secondary | ICD-10-CM

## 2021-08-28 DIAGNOSIS — I9589 Other hypotension: Secondary | ICD-10-CM | POA: Diagnosis not present

## 2021-08-28 DIAGNOSIS — I700 Atherosclerosis of aorta: Secondary | ICD-10-CM | POA: Diagnosis not present

## 2021-08-28 DIAGNOSIS — N182 Chronic kidney disease, stage 2 (mild): Secondary | ICD-10-CM | POA: Diagnosis not present

## 2021-08-28 DIAGNOSIS — I5032 Chronic diastolic (congestive) heart failure: Secondary | ICD-10-CM | POA: Diagnosis not present

## 2021-08-28 DIAGNOSIS — E1121 Type 2 diabetes mellitus with diabetic nephropathy: Secondary | ICD-10-CM | POA: Diagnosis not present

## 2021-08-28 DIAGNOSIS — I4891 Unspecified atrial fibrillation: Secondary | ICD-10-CM | POA: Diagnosis not present

## 2021-08-28 DIAGNOSIS — J449 Chronic obstructive pulmonary disease, unspecified: Secondary | ICD-10-CM | POA: Diagnosis not present

## 2021-08-28 DIAGNOSIS — I480 Paroxysmal atrial fibrillation: Secondary | ICD-10-CM | POA: Diagnosis not present

## 2021-08-28 LAB — CBC
HCT: 29.8 % — ABNORMAL LOW (ref 39.0–52.0)
Hemoglobin: 7.8 g/dL — ABNORMAL LOW (ref 13.0–17.0)
MCH: 15.6 pg — ABNORMAL LOW (ref 26.0–34.0)
MCHC: 26.2 g/dL — ABNORMAL LOW (ref 30.0–36.0)
MCV: 59.5 fL — ABNORMAL LOW (ref 80.0–100.0)
Platelets: 360 10*3/uL (ref 150–400)
RBC: 5.01 MIL/uL (ref 4.22–5.81)
RDW: 23.6 % — ABNORMAL HIGH (ref 11.5–15.5)
WBC: 8.7 10*3/uL (ref 4.0–10.5)
nRBC: 1.3 % — ABNORMAL HIGH (ref 0.0–0.2)

## 2021-08-28 LAB — COMPREHENSIVE METABOLIC PANEL
ALT: 173 U/L — ABNORMAL HIGH (ref 0–44)
AST: 273 U/L — ABNORMAL HIGH (ref 15–41)
Albumin: 2.6 g/dL — ABNORMAL LOW (ref 3.5–5.0)
Alkaline Phosphatase: 102 U/L (ref 38–126)
Anion gap: 4 — ABNORMAL LOW (ref 5–15)
BUN: 31 mg/dL — ABNORMAL HIGH (ref 8–23)
CO2: 29 mmol/L (ref 22–32)
Calcium: 7.5 mg/dL — ABNORMAL LOW (ref 8.9–10.3)
Chloride: 100 mmol/L (ref 98–111)
Creatinine, Ser: 1.06 mg/dL (ref 0.61–1.24)
GFR, Estimated: >60 mL/min (ref >60)
Glucose, Bld: 47 mg/dL — ABNORMAL LOW (ref 70–99)
Potassium: 4.1 mmol/L (ref 3.5–5.1)
Sodium: 133 mmol/L — ABNORMAL LOW (ref 135–145)
Total Bilirubin: 1.4 mg/dL — ABNORMAL HIGH (ref 0.3–1.2)
Total Protein: 6.4 g/dL — ABNORMAL LOW (ref 6.5–8.1)

## 2021-08-28 LAB — ECHOCARDIOGRAM COMPLETE
AR max vel: 1.71 cm2
AV Area VTI: 1.62 cm2
AV Area mean vel: 1.58 cm2
AV Mean grad: 5.5 mmHg
AV Peak grad: 8.6 mmHg
Ao pk vel: 1.47 m/s
Area-P 1/2: 3.76 cm2
Height: 68 in
S' Lateral: 5.4 cm
Weight: 3954.17 oz

## 2021-08-28 LAB — GLUCOSE, CAPILLARY
Glucose-Capillary: 104 mg/dL — ABNORMAL HIGH (ref 70–99)
Glucose-Capillary: 105 mg/dL — ABNORMAL HIGH (ref 70–99)
Glucose-Capillary: 48 mg/dL — ABNORMAL LOW (ref 70–99)
Glucose-Capillary: 59 mg/dL — ABNORMAL LOW (ref 70–99)
Glucose-Capillary: 69 mg/dL — ABNORMAL LOW (ref 70–99)
Glucose-Capillary: 70 mg/dL (ref 70–99)
Glucose-Capillary: 83 mg/dL (ref 70–99)
Glucose-Capillary: 87 mg/dL (ref 70–99)

## 2021-08-28 LAB — MAGNESIUM: Magnesium: 2.1 mg/dL (ref 1.7–2.4)

## 2021-08-28 LAB — COOXEMETRY PANEL
Carboxyhemoglobin: 1.9 % — ABNORMAL HIGH (ref 0.5–1.5)
Methemoglobin: 1.3 % (ref 0.0–1.5)
O2 Saturation: 69.2 %
Total hemoglobin: 8.7 g/dL — ABNORMAL LOW (ref 12.0–16.0)

## 2021-08-28 LAB — PROCALCITONIN: Procalcitonin: 0.14 ng/mL

## 2021-08-28 LAB — BRAIN NATRIURETIC PEPTIDE: B Natriuretic Peptide: 1346 pg/mL — ABNORMAL HIGH (ref 0.0–100.0)

## 2021-08-28 MED ORDER — SODIUM CHLORIDE 0.9 % IV SOLN: 2.0000 g | Freq: Three times a day (TID) | INTRAVENOUS | Status: DC

## 2021-08-28 MED ORDER — DEXTROSE 50 % IV SOLN
1.0000 | Freq: Once | INTRAVENOUS | Status: AC
  Administered 2021-08-28: 50 mL via INTRAVENOUS

## 2021-08-28 MED ORDER — FUROSEMIDE 10 MG/ML IJ SOLN
40.0000 mg | Freq: Two times a day (BID) | INTRAMUSCULAR | Status: DC
  Administered 2021-08-28 – 2021-09-02 (×11): 40 mg via INTRAVENOUS
  Filled 2021-08-28 (×11): qty 4

## 2021-08-28 MED ORDER — INSULIN DETEMIR 100 UNIT/ML ~~LOC~~ SOLN
10.0000 [IU] | Freq: Every day | SUBCUTANEOUS | Status: DC
  Filled 2021-08-28 (×2): qty 0.1

## 2021-08-28 MED ORDER — INSULIN ASPART 100 UNIT/ML IJ SOLN
0.0000 [IU] | INTRAMUSCULAR | Status: DC
  Administered 2021-08-29: 1 [IU] via SUBCUTANEOUS
  Administered 2021-08-30 – 2021-08-31 (×2): 2 [IU] via SUBCUTANEOUS
  Administered 2021-09-02: 1 [IU] via SUBCUTANEOUS

## 2021-08-28 MED ORDER — DEXTROSE 50 % IV SOLN
INTRAVENOUS | Status: AC
  Filled 2021-08-28: qty 50

## 2021-08-28 MED ORDER — PERFLUTREN LIPID MICROSPHERE
1.0000 mL | INTRAVENOUS | Status: AC | PRN
  Administered 2021-08-28: 5 mL via INTRAVENOUS
  Filled 2021-08-28: qty 10

## 2021-08-28 NOTE — Progress Notes (Signed)
Pharmacy Antibiotic Note  Perry Hunter a 71 y.o. male admitted on 08/28/2021 with sepsis.  Pharmacy has been consulted for vancomycin and cefepime dosing. Ordering Vancomycin  trough level for continued dosing Septic shock present on admission-unknown source.  Possibly related to cholecystitis. Plan to do HIDA scan.   Vancomycin random level ordered by MD at 0923 is 51mcg/ml.   Vanc Trough prior to 1600 dose - was 18 which is within the desired goal range of 15 - 20.  Plan: Continue Vancomycin 1250mg  IV every 24 hours.  Goal trough 15-20 mcg/mL. Cefepime 2gm IV every 8 hours. F/U cxs and clinical progress Monitor V/S, labs and levels as indicated  Medical History: Past Medical History:  Diagnosis Date   Arthritis    Asthma    CHF (congestive heart failure) (HCC)    Diabetes mellitus without complication (HCC)    GERD (gastroesophageal reflux disease)    Hypercholesteremia    Hypertension     Allergies:  No Known Allergies  Filed Weights   08/26/21 0500 08/27/21 0400 08/28/21 0414  Weight: 106.3 kg (234 lb 5.6 oz) 109.6 kg (241 lb 10 oz) 112.1 kg (247 lb 2.2 oz)    CBC Latest Ref Rng & Units 08/28/2021 08/27/2021 08/26/2021  WBC 4.0 - 10.5 K/uL 8.7 9.1 10.1  Hemoglobin 13.0 - 17.0 g/dL 7.8(L) 8.3(L) 8.6(L)  Hematocrit 39.0 - 52.0 % 29.8(L) 30.1(L) 31.2(L)  Platelets 150 - 400 K/uL 360 290 265     Estimated Creatinine Clearance: 76.5 mL/min (by C-G formula based on SCr of 1.06 mg/dL).  Antibiotics Given (last 72 hours)     Date/Time Action Medication Dose Rate   08/25/21 1446 New Bag/Given   metroNIDAZOLE (FLAGYL) IVPB 500 mg 500 mg 100 mL/hr   08/25/21 1721 New Bag/Given   vancomycin (VANCOREADY) IVPB 1250 mg/250 mL 1,250 mg 166.7 mL/hr   08/25/21 2159 New Bag/Given   ceFEPIme (MAXIPIME) 2 g in sodium chloride 0.9 % 100 mL IVPB 2 g 200 mL/hr   08/26/21 0433 New Bag/Given   metroNIDAZOLE (FLAGYL) IVPB 500 mg 500 mg 100 mL/hr   08/26/21 0936 New Bag/Given   ceFEPIme  (MAXIPIME) 2 g in sodium chloride 0.9 % 100 mL IVPB 2 g 200 mL/hr   08/26/21 1648 New Bag/Given   metroNIDAZOLE (FLAGYL) IVPB 500 mg 500 mg 100 mL/hr   08/26/21 1804 New Bag/Given   vancomycin (VANCOREADY) IVPB 1250 mg/250 mL 1,250 mg 166.7 mL/hr   08/26/21 2130 New Bag/Given   ceFEPIme (MAXIPIME) 2 g in sodium chloride 0.9 % 100 mL IVPB 2 g 200 mL/hr   08/27/21 0407 New Bag/Given   metroNIDAZOLE (FLAGYL) IVPB 500 mg 500 mg 100 mL/hr   08/27/21 0805 New Bag/Given   ceFEPIme (MAXIPIME) 2 g in sodium chloride 0.9 % 100 mL IVPB 2 g 200 mL/hr   08/27/21 1641 New Bag/Given   metroNIDAZOLE (FLAGYL) IVPB 500 mg 500 mg 100 mL/hr   08/27/21 1816 New Bag/Given   vancomycin (VANCOREADY) IVPB 1250 mg/250 mL 1,250 mg 166.7 mL/hr   08/27/21 2238 New Bag/Given   ceFEPIme (MAXIPIME) 2 g in sodium chloride 0.9 % 100 mL IVPB 2 g 200 mL/hr   08/28/21 0359 New Bag/Given   metroNIDAZOLE (FLAGYL) IVPB 500 mg 500 mg 100 mL/hr       Antimicrobials this admission:  Cefepime 08/24/2021  >>  vancomycin 08/24/2021  >>  Metronidazole 08/24/2021   x 1   Microbiology results: 08/24/2021  BCx: ngtd 08/24/2021  UCx: no growth 08/24/2021  Resp Panel: neg 08/24/2021  MRSA PCR: positive   Thank you for allowing pharmacy to be a part of this patients care.  Thomasenia Sales, PharmD, [facility] Clinical Pharmacist  08/28/2021, 7:45 AM

## 2021-08-28 NOTE — Consult Note (Addendum)
Cardiology Consultation:   Patient ID: Perry Hunter MRN: [id number]; DOB: Jun 20, 1951  Admit date: 08/24/2021 Date of Consult: 08/28/2021  PCP:  Neale Burly, MD   [place] Providers Cardiologist:  Bensimhon   Patient Profile:   Perry Hunter is a 71 y.o. male with a hx of chronic sysotlic HF, CAD, afib who is being seen 08/28/2021 for the evaluation of SOB at the request of Dr Manuella Ghazi.  History of Present Illness:   Perry Hunter 71 yo male history of chronic systolic HF LVEF 23-76%. From notes thought secondary to CAD, afib/tachymediated, and perhaps EtOH. Admission 08/2020 with biventricular failure, required milrinone. History of afib, prior GI bleed, DM2 admitted Jan 27with AMS. Managed initially for septic shock by primary team with IVFs, antibiotics, and transient levophed requirement. Admission complicated by AKI and ALI likely secondary to hypotension, as well as significant hypercapneic respiratory failure requiring bipap. BP's have improved off pressors, after IVFs patient now with signs of fluid overload requiring diuresis. Patient reports mild SOB, denies any chest pains.     K 4.9 Cr 1.71 BUN 21 WBC 8.3 Hgb 8.8 Plt 222 INR 2 TSH 1.38 Lactic acid 7.3 Ammonia 29 INR 2 BNP 1346 COVID neg flu neg ABG 7.16/75/77/22 EKG afib, IVCD CXR: cariomegaly, pulm congestion CT A/P:small bilateral effusions, trace ascites RUQ [place]: mod to severe gall bladder thickeing, no cholelithiasis.  HIDA scan: normal gall bladder  Jan 2022 echo LVE 20-25%, grade III dd, severe RV dysfunction,  08/2020 RHC/LHC: prox RCA 99%, mid LA D 40%, prox LAD 30%, mid LCX 100% and distal 90% CI 2.8 mean PA 35 PCWP 28    Past Medical History:  Diagnosis Date   Arthritis    Asthma    CHF (congestive heart failure) ([place])    Diabetes mellitus without complication (HCC)    GERD (gastroesophageal reflux disease)    Hypercholesteremia    Hypertension     Past Surgical History:  Procedure Laterality  Date   BIOPSY  08/29/2020   Procedure: BIOPSY;  Surgeon: Irving Copas., MD;  Location: [place];  Service: Gastroenterology;;   CARDIOVERSION N/A 09/07/2020   Procedure: CARDIOVERSION;  Surgeon: Jolaine Artist, MD;  Location[facility] ENDOSCOPY;  Service: Cardiovascular;  Laterality: N/A;   ESOPHAGOGASTRODUODENOSCOPY (EGD) WITH PROPOFOL N/A 08/29/2020   Procedure: ESOPHAGOGASTRODUODENOSCOPY (EGD) WITH PROPOFOL;  Surgeon: Irving Copas., MD;  Location: [place];  Service: Gastroenterology;  Laterality: N/A;   RIGHT/LEFT HEART CATH AND CORONARY ANGIOGRAPHY N/A 08/31/2020   Procedure: RIGHT/LEFT HEART CATH AND CORONARY ANGIOGRAPHY;  Surgeon: Jolaine Artist, MD;  Location: [place] [facility];  Service: Cardiovascular;  Laterality: N/A;   TEE WITHOUT CARDIOVERSION N/A 09/07/2020   Procedure: TRANSESOPHAGEAL ECHOCARDIOGRAM (TEE);  Surgeon: Jolaine Artist, MD;  Location: [facility] ENDOSCOPY;  Service: Cardiovascular;  Laterality: N/A;       Inpatient Medications: Scheduled Meds:  Chlorhexidine Gluconate Cloth  6 each Topical Q0600   dextrose       digoxin  0.0625 mg Oral Daily   enoxaparin (LOVENOX) injection  100 mg Subcutaneous Q12H   finasteride  5 mg Oral Daily   furosemide  40 mg Intravenous Q12H   insulin aspart  0-15 Units Subcutaneous Q4H   insulin detemir  20 Units Subcutaneous QHS   midodrine  15 mg Oral TID WC   mupirocin ointment  1 application Nasal BID   pantoprazole (PROTONIX) IV  40 mg Intravenous Q12H   sucralfate  1 g Oral QID   traZODone  50 mg Oral QHS   umeclidinium bromide  1 puff Inhalation Daily   Continuous Infusions:  sodium chloride     diltiazem (CARDIZEM) infusion Stopped (08/25/21 0115)   norepinephrine (LEVOPHED) Adult infusion Stopped (08/26/21 1043)   PRN Meds: albuterol, HYDROcodone-acetaminophen, LORazepam, ondansetron **OR** ondansetron (ZOFRAN) IV  Allergies:   No Known Allergies  Social History:   Social History    Socioeconomic History   Marital status: Married    Spouse name: Not on file   Number of children: Not on file   Years of education: Not on file   Highest education level: Not on file  Occupational History   Not on file  Tobacco Use   Smoking status: Every Day    Types: Cigarettes   Smokeless tobacco: Never  Substance and Sexual Activity   Alcohol use: Not Currently   Drug use: Never   Sexual activity: Not Currently  Other Topics Concern   Not on file  Social History Narrative   Not on file   Social Determinants of Health   Financial Resource Strain: Not on file  Food Insecurity: Not on file  Transportation Needs: Not on file  Physical Activity: Not on file  Stress: Not on file  Social Connections: Not on file  Intimate Partner Violence: Not on file    Family History:   History reviewed. No pertinent family history.   ROS:  Please see the history of present illness.   All other ROS reviewed and negative.     Physical Exam/Data:   Vitals:   08/28/21 0500 08/28/21 0600 08/28/21 0700 08/28/21 0800  BP: 106/67 (!) 140/92  108/63  Pulse: 90   97  Resp: (!) 0 14  (!) 27  Temp:   97.6 F (36.4 C)   TempSrc:   Axillary   SpO2: 100%   93%  Weight:      Height:        Intake/Output Summary (Last 24 hours) at 08/28/2021 0831 Last data filed at 08/28/2021 0800 Gross per 24 hour  Intake 1246.01 ml  Output 1475 ml  Net -228.99 ml   Last 3 Weights 08/28/2021 08/27/2021 08/26/2021  Weight (lbs) 247 lb 2.2 oz 241 lb 10 oz 234 lb 5.6 oz  Weight (kg) 112.1 kg 109.6 kg 106.3 kg     Body mass index is 37.58 kg/m.  General:  Well nourished, well developed, in no acute distress HEENT: normal Neck: no JVD Vascular: No carotid bruits; Distal pulses 2+ bilaterally Cardiac:  irreg Lungs:  decreased BS bases, mild wheezing.  Abd: soft, nontender, no hepatomegaly  Ext: no edema Musculoskeletal:  No deformities, BUE and BLE strength normal and equal Skin: warm and dry   Neuro:  CNs 2-12 intact, no focal abnormalities noted Psych:  Normal affect     Laboratory Data:  High Sensitivity Troponin:  No results for input(s): TROPONINIHS in the last 720 hours.   Chemistry Recent Labs  Lab 08/26/21 0448 08/27/21 0243 08/28/21 0354  NA 133* 133* 133*  K 5.3* 4.6 4.1  CL 99 100 100  CO2 26 26 29   GLUCOSE 87 85 47*  BUN 39* 38* 31*  CREATININE 1.82* 1.51* 1.06  CALCIUM 7.9* 7.8* 7.5*  MG 1.8 1.9 2.1  GFRNONAA 39* 49* >60  ANIONGAP 8 7 4*    Recent Labs  Lab 08/26/21 0448 08/27/21 0243 08/28/21 0354  PROT 6.8 6.5 6.4*  ALBUMIN 2.9* 2.7* 2.6*  AST 292* 413* 273*  ALT 146* 207* 173*  ALKPHOS 100 103 102  BILITOT 1.7* 1.6* 1.4*   Lipids No results for input(s): CHOL, TRIG, HDL, LABVLDL, LDLCALC, CHOLHDL in the last 168 hours.  Hematology Recent Labs  Lab 08/26/21 0448 08/27/21 0243 08/28/21 0354  WBC 10.1 9.1 8.7  RBC 5.16 5.00 5.01  HGB 8.6* 8.3* 7.8*  HCT 31.2* 30.1* 29.8*  MCV 60.5* 60.2* 59.5*  MCH 16.7* 16.6* 15.6*  MCHC 27.6* 27.6* 26.2*  RDW 23.8* 23.6* 23.6*  PLT 265 290 360   Thyroid  Recent Labs  Lab 08/24/21 1610  TSH 1.380    BNP Recent Labs  Lab 08/28/21 0354  BNP 1,346.0*    DDimer No results for input(s): DDIMER in the last 168 hours.   Radiology/Studies:  CT CHEST ABDOMEN PELVIS W CONTRAST  Result Date: 08/24/2021 CLINICAL DATA:  Sepsis, worsening mental status, confusion EXAM: CT CHEST, ABDOMEN, AND PELVIS WITH CONTRAST TECHNIQUE: Multidetector CT imaging of the chest, abdomen and pelvis was performed following the standard protocol during bolus administration of intravenous contrast. RADIATION DOSE REDUCTION: This exam was performed according to the departmental dose-optimization program which includes automated exposure control, adjustment of the mA and/or kV according to patient size and/or use of iterative reconstruction technique. CONTRAST:  76mL OMNIPAQUE IOHEXOL 300 MG/ML  SOLN COMPARISON:   08/24/2021, 08/30/2020 FINDINGS: CT CHEST FINDINGS Cardiovascular: The heart is enlarged, with trace pericardial effusion. There is marked atherosclerosis of the coronary vasculature. No evidence of thoracic aortic aneurysm or dissection. Moderate atherosclerosis of the aortic arch. Mediastinum/Nodes: Precarinal adenopathy measuring up to 17 mm in short axis. Subcentimeter lymph nodes are seen elsewhere within the mediastinum. No hilar or axillary adenopathy. Thyroid, trachea, and esophagus are grossly unremarkable. Lungs/Pleura: There are small bilateral pleural effusions, right greater than left. No acute airspace disease. No pneumothorax. Central airways are patent. Musculoskeletal: No acute or destructive bony lesions. Reconstructed images demonstrate no additional findings. CT ABDOMEN PELVIS FINDINGS Hepatobiliary: No focal liver abnormality is seen. No gallstones, gallbladder wall thickening, or biliary dilatation. Pancreas: Unremarkable. No pancreatic ductal dilatation or surrounding inflammatory changes. Spleen: Normal in size without focal abnormality. Adrenals/Urinary Tract: Adrenal glands are unremarkable. Kidneys are normal, without renal calculi, focal lesion, or hydronephrosis. Bladder is unremarkable. Stomach/Bowel: No bowel obstruction or ileus. Normal appendix right lower quadrant. No bowel wall thickening or inflammatory change. Vascular/Lymphatic: Aortic atherosclerosis. No enlarged abdominal or pelvic lymph nodes. Reproductive: Prostate is unremarkable. Other: Trace ascites most pronounced in the upper abdomen. No free intraperitoneal gas. No abdominal wall hernia. Musculoskeletal: No acute or destructive bony lesions. Reconstructed images demonstrate no additional findings. IMPRESSION: Chest: 1. Cardiomegaly, with trace pericardial effusion. 2. Small bilateral pleural effusions, right greater the left. 3. Mediastinal lymphadenopathy. 4. Aortic Atherosclerosis (ICD10-I70.0). Coronary artery  atherosclerosis. Abdomen/pelvis: 1. Trace ascites. 2. Otherwise no acute intra-abdominal or intrapelvic process. 3.  Aortic Atherosclerosis (ICD10-I70.0). Electronically Signed   By: Randa Ngo M.D.   On: 08/24/2021 17:49   NM Hepato W/EF  Result Date: 08/27/2021 CLINICAL DATA:  Abdominal pain. EXAM: NUCLEAR MEDICINE HEPATOBILIARY IMAGING WITH GALLBLADDER EF TECHNIQUE: Sequential images of the abdomen were obtained out to 60 minutes following intravenous administration of radiopharmaceutical. After oral ingestion of Ensure, gallbladder ejection fraction was determined. At 60 min, normal ejection fraction is greater than 33%. RADIOPHARMACEUTICALS:  mCi Tc-11m  Choletec IV COMPARISON:  None. FINDINGS: Prompt uptake and biliary excretion of activity by the liver is seen. Gallbladder activity is visualized, consistent with patency of cystic duct. Biliary activity passes into small bowel, consistent  with patent common bile duct. Calculated gallbladder ejection fraction is 57%. (Normal gallbladder ejection fraction with Ensure is greater than 33%.) IMPRESSION: 1. Normal gallbladder ejection fraction. 2. Patent cystic duct and common bile duct. Electronically Signed   By: Suzy Bouchard M.D.   On: 08/27/2021 16:57   DG CHEST PORT 1 VIEW  Result Date: 08/25/2021 CLINICAL DATA:  Edema. HX of asthma, diabetes and hypertension. EXAM: PORTABLE CHEST 1 VIEW COMPARISON:  08/24/2021 and older studies. FINDINGS: Moderate to marked enlargement of the cardiopericardial silhouette, stable. Persistent vascular congestion and interstitial thickening, without convincing change from the previous day's study. Additional opacity at the lung bases is consistent with small effusions, more evident on the right. No pneumothorax. Left internal jugular central venous line is stable. IMPRESSION: 1. No convincing change from the previous day's study. 2. Enlarged cardiopericardial silhouette consistent with a combination of  cardiomegaly and pericardial fluid as noted on the previous day's CT. 3. Right greater than left pleural effusions and vascular congestion with interstitial thickening. Findings support congestive heart failure and interstitial edema. Electronically Signed   By: Lajean Manes M.D.   On: 08/25/2021 09:40   DG Chest Port 1 View  Result Date: 08/24/2021 CLINICAL DATA:  Possible sepsis.  Altered mental status. EXAM: PORTABLE CHEST 1 VIEW COMPARISON:  09/05/2020 FINDINGS: Marked enlargement of the cardiopericardial silhouette, increased from prior. Pulmonary vascular congestion with diffuse bilateral interstitial prominence. Aortic atherosclerosis. No pleural effusion or pneumothorax. IMPRESSION: 1. Marked enlargement of the cardiopericardial silhouette, increased from prior. Pericardial effusion not excluded. 2. Pulmonary vascular congestion with diffuse bilateral interstitial prominence, likely representing mild edema. Electronically Signed   By: Davina Poke D.O.   On: 08/24/2021 16:30   DG Chest Port 1V same Day  Result Date: 08/24/2021 CLINICAL DATA:  Internal jugular catheter placement EXAM: PORTABLE CHEST 1 VIEW COMPARISON:  08/24/2021 FINDINGS: Frontal view of the chest demonstrates interval placement of a left internal jugular catheter tip overlying superior vena cava. The cardiac silhouette remains enlarged. Persistent central vascular congestion without airspace disease. The bilateral effusions seen on recent CT are not well visualized. No pneumothorax. Right costophrenic angle is excluded by collimation. IMPRESSION: 1. No complication after left internal jugular catheter placement. 2. Persistent central vascular congestion. 3. Cardiomegaly. Electronically Signed   By: Randa Ngo M.D.   On: 08/24/2021 22:49   US Abdomen Limited RUQ (LIVER/GB)  Result Date: 08/26/2021 CLINICAL DATA:  Transaminitis. EXAM: ULTRASOUND ABDOMEN LIMITED RIGHT UPPER QUADRANT COMPARISON:  August 24, 2021.  August 27, 2020. FINDINGS: Gallbladder: No cholelithiasis is noted. Moderate to severe gallbladder wall thickening is noted measuring 7 mm. Mild ascites is noted. Common bile duct: Diameter: 6 mm which is within normal limits. Liver: No focal lesion identified. Mildly increased echogenicity of hepatic parenchyma is noted suggesting hepatic steatosis or other diffuse hepatocellular disease. Portal vein is patent on color Doppler imaging with normal direction of blood flow towards the liver. Other: None. IMPRESSION: Mildly increased echogenicity of hepatic parenchyma is noted suggesting hepatic steatosis or other diffuse hepatocellular disease. Moderate to severe gallbladder wall thickening is noted without cholelithiasis. It is uncertain if this is due to cholecystitis or adjacent hepatocellular disease. Mild ascites is noted in the right upper quadrant. No definite biliary dilatation is noted. Electronically Signed   By: Marijo Conception M.D.   On: 08/26/2021 11:53     Assessment and Plan:   1.Acute on chronic systolic HF - Jan 4166 echo LVE 20-25%, grade III dd, severe  RV dysfunction,  - 08/2020 RHC/LHC: prox RCA 99%, mid LA D 40%, prox LAD 30%, mid LCX 100% and distal 90% - from notes thought secondary to CAD, afib/tachymediated, and perhaps EtOH.  -medical therapy has been limited by low bp's   -presented with shock, iniitally thought as septic shock - aggressive IVFs, +4L by charting - BNP up to 1346 and f/u CXR with pulm edema - started on IV lasix first dose this AM, follow diuresis - Further cardiac management would depend on coox and ability to diurese.     2.Shock - lactic acid 7.3-->4.4-->2.5-->1.4 -Pro calc 0.27-->0.28-->0.32-->0.14 - multiorgnan failure with AKI, transaminitis, AMS  -initially managed as septic shock, broad spectrum and aggressive IVFs. Have not found clear source. Unclear if cardiogenic component - transiently on norepi now off, continues on his chronic home midodrine.   - BP's and lactic acidosis have improved - COOX pending. Repeat echo pending. Further cardiac management would depend on coox and ability to diurese.    3.AKI - admitted with Cr 1.71, baseline 1.2 - likely secondary to shock, hypotensin - Cr this AM has normalized  4.Chronic hypotension - he is on high dose midodrine at home.   5.Hypercapneic respiratory failure - improved with bipap, now off. Just on 2L [place]  6.Afib - eliquis on hold, on lovenox in ICU setting - on just oral digoxin at home for rate control, rates are reasonable at this time.     For questions or updates, please contact [place] Please consult [email or web address] for contact info under    Signed, Carlyle Dolly, MD  08/28/2021 8:31 AM

## 2021-08-28 NOTE — Plan of Care (Signed)

## 2021-08-28 NOTE — Progress Notes (Signed)
Inpatient Diabetes Program Recommendations  AACE/ADA: New Consensus Statement on Inpatient Glycemic Control   Target Ranges:  Prepandial:   less than 140 mg/dL      Peak postprandial:   less than 180 mg/dL (1-2 hours)      Critically ill patients:  140 - 180 mg/dL    Latest Reference Range & Units 08/28/21 04:05 08/28/21 04:36 08/28/21 07:40  Glucose-Capillary 70 - 99 mg/dL 48 (L) 104 (H) 59 (L)    Latest Reference Range & Units 08/27/21 11:24 08/27/21 16:40 08/27/21 19:32 08/27/21 23:08  Glucose-Capillary 70 - 99 mg/dL 91 104 (H) 126 (H) 93   Review of Glycemic Control  Diabetes history: DM2 Outpatient Diabetes medications: Levemir 40 units QHS, Metformin 1000 mg QAM, Januvia 100 mg daily Current orders for Inpatient glycemic control: Levemir 20 units QHS, Novolog 0-15 units Q4H  Inpatient Diabetes Program Recommendations:    Insulin: Patient received Levemir 20 units last night. Glucose 48 mg/dl at 4:05 am today and 59 mg/dl at 7:40 am today.  Please consider decreasing Levemir to 10 units QHS and Novolog correction to 0-9 units Q4H.   Thanks, Barnie Alderman, RN, MSN, CDE Diabetes Coordinator Inpatient Diabetes Program [phone] (Team Pager from 8am to 5pm)

## 2021-08-28 NOTE — Progress Notes (Signed)
*  PRELIMINARY RESULTS* Echocardiogram 2D Echocardiogram has been performed.  Elpidio Anis 08/28/2021, 11:42 AM

## 2021-08-28 NOTE — Progress Notes (Signed)
*  PRELIMINARY RESULTS* Echocardiogram 2D Echocardiogram has been performed.  Perry Hunter 08/28/2021, 9:57 AM

## 2021-08-28 NOTE — Progress Notes (Signed)
[facility] Progress Note     Subjective: HIDA negative, LFTs likely all elevated from hepatic congestion from CHF and fatty liver.   Does have some left sided abdominal pain, cannot really say if worse with eating or not. EGD 08/2020 showed severe ulcerative esophagitis and gastritis, and he is on carafate and protonix.   Objective: Vital signs in last 24 hours: Temp:  [96.9 F (36.1 C)-99.1 F (37.3 C)] 97.6 F (36.4 C) (01/31 0700) Pulse Rate:  [52-111] 103 (01/31 1000) Resp:  [0-27] 16 (01/31 1000) BP: (89-160)/(54-106) 160/106 (01/31 1000) SpO2:  [93 %-100 %] 99 % (01/31 1000) FiO2 (%):  [28 %] 28 % (01/31 0400) Weight:  [112.1 kg] 112.1 kg (01/31 0414) Last BM Date: 08/24/21  Intake/Output from previous day: 01/30 0701 - 01/31 0700 In: 1290.9 [P.O.:120; I.V.:495.9; IV Piggyback:675.1] Out: 1475 [Urine:1475] Intake/Output this shift: Total I/O In: 315.1 [P.O.:240; IV Piggyback:75.1] Out: -   General appearance: alert and no distress GI: distended, epigastric tenderness but no RUQ tenderness   Lab Results:  Recent Labs    08/27/21 0243 08/28/21 0354  WBC 9.1 8.7  HGB 8.3* 7.8*  HCT 30.1* 29.8*  PLT 290 360   BMET Recent Labs    08/27/21 0243 08/28/21 0354  NA 133* 133*  K 4.6 4.1  CL 100 100  CO2 26 29  GLUCOSE 85 47*  BUN 38* 31*  CREATININE 1.51* 1.06  CALCIUM 7.8* 7.5*   PT/INR No results for input(s): LABPROT, INR in the last 72 hours.  Studies/Results: NM Hepato W/EF  Result Date: 08/27/2021 CLINICAL DATA:  Abdominal pain. EXAM: NUCLEAR MEDICINE HEPATOBILIARY IMAGING WITH GALLBLADDER EF TECHNIQUE: Sequential images of the abdomen were obtained out to 60 minutes following intravenous administration of radiopharmaceutical. After oral ingestion of Ensure, gallbladder ejection fraction was determined. At 60 min, normal ejection fraction is greater than 33%. RADIOPHARMACEUTICALS:  mCi Tc-28m  Choletec IV COMPARISON:  None.  FINDINGS: Prompt uptake and biliary excretion of activity by the liver is seen. Gallbladder activity is visualized, consistent with patency of cystic duct. Biliary activity passes into small bowel, consistent with patent common bile duct. Calculated gallbladder ejection fraction is 57%. (Normal gallbladder ejection fraction with Ensure is greater than 33%.) IMPRESSION: 1. Normal gallbladder ejection fraction. 2. Patent cystic duct and common bile duct. Electronically Signed   By: Suzy Bouchard M.D.   On: 08/27/2021 16:57    Anti-infectives: Anti-infectives (From admission, onward)    Start     Dose/Rate Route Frequency Ordered Stop   08/28/21 1400  ceFEPIme (MAXIPIME) 2 g in sodium chloride 0.9 % 100 mL IVPB  Status:  Discontinued        2 g 200 mL/hr over 30 Minutes Intravenous Every 8 hours 08/28/21 0745 08/28/21 0759   08/25/21 1600  vancomycin (VANCOREADY) IVPB 1250 mg/250 mL  Status:  Discontinued        1,250 mg 166.7 mL/hr over 90 Minutes Intravenous Every 24 hours 08/24/21 1649 08/28/21 0759   08/25/21 0445  metroNIDAZOLE (FLAGYL) IVPB 500 mg  Status:  Discontinued        500 mg 100 mL/hr over 60 Minutes Intravenous Every 12 hours 08/25/21 0355 08/28/21 0759   08/25/21 0400  ceFEPIme (MAXIPIME) 2 g in sodium chloride 0.9 % 100 mL IVPB  Status:  Discontinued        2 g 200 mL/hr over 30 Minutes Intravenous Every 12 hours 08/24/21 1649 08/28/21 0745   08/24/21 1700  vancomycin (VANCOCIN)  IVPB 1000 mg/200 mL premix        1,000 mg 200 mL/hr over 60 Minutes Intravenous  Once 08/24/21 1649 08/24/21 2008   08/24/21 1615  ceFEPIme (MAXIPIME) 2 g in sodium chloride 0.9 % 100 mL IVPB        2 g 200 mL/hr over 30 Minutes Intravenous  Once 08/24/21 1603 08/24/21 1705   08/24/21 1615  metroNIDAZOLE (FLAGYL) IVPB 500 mg        500 mg 100 mL/hr over 60 Minutes Intravenous  Once 08/24/21 1603 08/24/21 2007   08/24/21 1615  vancomycin (VANCOCIN) IVPB 1000 mg/200 mL premix        1,000 mg 200  mL/hr over 60 Minutes Intravenous  Once 08/24/21 1603 08/24/21 2007       Assessment/Plan: Patient with negative HIDA, no cholecystitis. He has elevated LFTs likely from hepatic congestion. He may have worsening esophagitis or gastritis, stress ulcer given his recent illness and ICU stay that could explain some of the pain he is having in his abdomen. He is on protonix 40 BID and carafate. He may need repeat EGD if not improving. Has not had a repeat since 08/2020.   Updated family and team.    LOS: 4 days    Virl Cagey 08/28/2021

## 2021-08-28 NOTE — Progress Notes (Signed)
PROGRESS NOTE    Perry Hunter  HCW:[id number] DOB: Mar 13, 1951 DOA: 08/24/2021 PCP: Neale Burly, MD   Brief Narrative:   Perry Hunter is a 71 y.o. male with a history of diabetes, CHF, hypertension, COPD, paroxysmal atrial fibrillation on anticoagulation, BPH, obstructive sleep apnea not on CPAP.  Patient brought to the hospital for increasing confusion, shortness of breath, fever.  Patient was admitted with initial concerns for septic shock with unknown source with associated metabolic encephalopathy and AKI.  However, it appears that he may not have initially been septic and simply presented with shock-possibly cardiogenic.  No source of infection has been found and therefore antibiotics have been discontinued 1/31.  He was thought to have possible acute cholecystitis during the course of this hospitalization as well, however HIDA scan has come back negative.  He now appears quite volume overloaded and likely has acute on chronic systolic CHF exacerbation and has been started on IV Lasix with cardiology following.  Assessment & Plan:   Principal Problem:   Sepsis ([place]) Active Problems:   Essential hypertension   COPD (chronic obstructive pulmonary disease) (HCC)   Diabetes mellitus (HCC)   PAF (paroxysmal atrial fibrillation) (HCC)   Elevated LFTs   AKI (acute kidney injury) ([place])   Acute metabolic encephalopathy   Shock present on admission-unknown source -Initially thought to be septic shock, but no source found and procalcitonin low-discontinue antibiotics 1/31 -Possibly related to cardiogenic etiology with low EF noted-repeat 2D echocardiogram today -Currently weaned off norepinephrine, continue midodrine -Blood cultures negative x4 days -No signs of UTI noted -Lactic acidosis resolved  Acute on chronic systolic congestive heart failure exacerbation -Noted to have LVEF 20-25% on 2D echocardiogram 07/2020 -Repeat 2D echocardiogram ordered and pending -Unable to initially  diurese given low BP, but will start IV Lasix twice daily 1/31 -Monitor CVP through central venous line -Strict I's and O's and fluid restriction -Appreciate cardiology consultation for assistance   Acute metabolic/hypercapnic encephalopathy secondary to above-improved -Continue to monitor closely -TSH 1.38 -Ammonia 29   AKI related to above-resolved -Baseline creatinine 0.8-1.2 -Monitor carefully on IV diuresis   Elevated LFTs related to hepatic congestion -Seems to be related to acute heart failure as noted above -No acute process noted on CT scan -Abdominal ultrasound with gallbladder wall thickening noted which prompted general surgery evaluation and HIDA scan on 1/30 which has now returned negative -Continue to monitor LFTs -Discontinued statin  Chronic hypotension -Continue high-dose midodrine   PAF -Improved rate control and off Cardizem -Continue digoxin and monitor on telemetry -Continue full dose Lovenox for now and then transition back to Eliquis once stable enough to do so   Type 2 diabetes with some hypoglycemia -Levemir decreased to 10 units at night -SSI decreased     DVT prophylaxis: Full dose Lovenox Code Status: DNR Family Communication: Daughter and Granddaughter at bedside 1/31 Disposition Plan:  Status is: Inpatient   Remains inpatient appropriate because: Requires IV medications and is in critical condition.     Consultants:  General surgery Cardiology   Procedures:  See below  Antimicrobials:  Anti-infectives (From admission, onward)    Start     Dose/Rate Route Frequency Ordered Stop   08/28/21 1400  ceFEPIme (MAXIPIME) 2 g in sodium chloride 0.9 % 100 mL IVPB  Status:  Discontinued        2 g 200 mL/hr over 30 Minutes Intravenous Every 8 hours 08/28/21 0745 08/28/21 0759   08/25/21 1600  vancomycin (VANCOREADY) IVPB 1250 mg/250 mL  Status:  Discontinued        1,250 mg 166.7 mL/hr over 90 Minutes Intravenous Every 24 hours 08/24/21  1649 08/28/21 0759   08/25/21 0445  metroNIDAZOLE (FLAGYL) IVPB 500 mg  Status:  Discontinued        500 mg 100 mL/hr over 60 Minutes Intravenous Every 12 hours 08/25/21 0355 08/28/21 0759   08/25/21 0400  ceFEPIme (MAXIPIME) 2 g in sodium chloride 0.9 % 100 mL IVPB  Status:  Discontinued        2 g 200 mL/hr over 30 Minutes Intravenous Every 12 hours 08/24/21 1649 08/28/21 0745   08/24/21 1700  vancomycin (VANCOCIN) IVPB 1000 mg/200 mL premix        1,000 mg 200 mL/hr over 60 Minutes Intravenous  Once 08/24/21 1649 08/24/21 2008   08/24/21 1615  ceFEPIme (MAXIPIME) 2 g in sodium chloride 0.9 % 100 mL IVPB        2 g 200 mL/hr over 30 Minutes Intravenous  Once 08/24/21 1603 08/24/21 1705   08/24/21 1615  metroNIDAZOLE (FLAGYL) IVPB 500 mg        500 mg 100 mL/hr over 60 Minutes Intravenous  Once 08/24/21 1603 08/24/21 2007   08/24/21 1615  vancomycin (VANCOCIN) IVPB 1000 mg/200 mL premix        1,000 mg 200 mL/hr over 60 Minutes Intravenous  Once 08/24/21 1603 08/24/21 2007       Subjective: Patient seen and evaluated today with need for BiPAP overnight.  He is more alert and awake this morning and denies any symptomatic complaints or concerns.  He was noted to have some hypoglycemia this morning requiring some orange juice for correction.  He continues to have significant volume overload this morning.  Objective: Vitals:   08/28/21 0500 08/28/21 0600 08/28/21 0700 08/28/21 0800  BP: 106/67 (!) 140/92  108/63  Pulse: 90   97  Resp: (!) 0 14  (!) 27  Temp:   97.6 F (36.4 C)   TempSrc:   Axillary   SpO2: 100%   93%  Weight:      Height:        Intake/Output Summary (Last 24 hours) at 08/28/2021 0919 Last data filed at 08/28/2021 0800 Gross per 24 hour  Intake 1246.01 ml  Output 1475 ml  Net -228.99 ml   Filed Weights   08/26/21 0500 08/27/21 0400 08/28/21 0414  Weight: 106.3 kg 109.6 kg 112.1 kg    Examination:  General exam: Appears calm and comfortable   Respiratory system: Decreased breath sounds at the bases with mild wheezing. Respiratory effort normal.  Currently on nasal cannula oxygen. Cardiovascular system: S1 & S2 heard, RRR.  Gastrointestinal system: Abdomen is soft Central nervous system: Alert and awake Extremities: Bilateral lower extremity edema present 2+ Skin: No significant lesions noted Psychiatry: Flat affect.    Data Reviewed: I have personally reviewed following labs and imaging studies  CBC: Recent Labs  Lab 08/24/21 1602 08/24/21 1603 08/25/21 0237 08/26/21 0448 08/27/21 0243 08/28/21 0354  WBC 8.3  --  6.9 10.1 9.1 8.7  NEUTROABS 6.8  --   --   --   --   --   HGB 8.8* 13.9 7.9* 8.6* 8.3* 7.8*  HCT 33.4* 41.0 29.4* 31.2* 30.1* 29.8*  MCV 60.1*  --  60.0* 60.5* 60.2* 59.5*  PLT 222  --  198 265 290 938   Basic Metabolic Panel: Recent Labs  Lab 08/24/21 1602 08/24/21 1603 08/25/21 0237 08/26/21 0448 08/27/21  0243 08/28/21 0354  NA 134* 136 133* 133* 133* 133*  K 4.9 5.0 4.6 5.3* 4.6 4.1  CL 97* 99 100 99 100 100  CO2 27  --  27 26 26 29   GLUCOSE 112* 106* 133* 87 85 47*  BUN 21 21 26* 39* 38* 31*  CREATININE 1.71* 1.50* 1.77* 1.82* 1.51* 1.06  CALCIUM 8.2*  --  7.7* 7.9* 7.8* 7.5*  MG  --   --   --  1.8 1.9 2.1   GFR: Estimated Creatinine Clearance: 76.5 mL/min (by C-G formula based on SCr of 1.06 mg/dL). Liver Function Tests: Recent Labs  Lab 08/24/21 1602 08/25/21 0237 08/26/21 0448 08/27/21 0243 08/28/21 0354  AST 53* 124* 292* 413* 273*  ALT 50* 73* 146* 207* 173*  ALKPHOS 107 95 100 103 102  BILITOT 1.9* 1.6* 1.7* 1.6* 1.4*  PROT 7.4 6.4* 6.8 6.5 6.4*  ALBUMIN 3.1* 2.8* 2.9* 2.7* 2.6*   No results for input(s): LIPASE, AMYLASE in the last 168 hours. Recent Labs  Lab 08/24/21 1849 08/26/21 1205  AMMONIA 29 30   Coagulation Profile: Recent Labs  Lab 08/24/21 1602 08/25/21 0237  INR 2.0* 2.0*   Cardiac Enzymes: No results for input(s): CKTOTAL, CKMB, CKMBINDEX,  TROPONINI in the last 168 hours. BNP (last 3 results) No results for input(s): PROBNP in the last 8760 hours. HbA1C: No results for input(s): HGBA1C in the last 72 hours. CBG: Recent Labs  Lab 08/27/21 1932 08/27/21 2308 08/28/21 0405 08/28/21 0436 08/28/21 0740  GLUCAP 126* 93 48* 104* 59*   Lipid Profile: No results for input(s): CHOL, HDL, LDLCALC, TRIG, CHOLHDL, LDLDIRECT in the last 72 hours. Thyroid Function Tests: No results for input(s): TSH, T4TOTAL, FREET4, T3FREE, THYROIDAB in the last 72 hours. Anemia Panel: No results for input(s): VITAMINB12, FOLATE, FERRITIN, TIBC, IRON, RETICCTPCT in the last 72 hours. Sepsis Labs: Recent Labs  Lab 08/24/21 1645 08/24/21 1810 08/24/21 2348 08/24/21 2349 08/25/21 0237 08/26/21 0448 08/28/21 0354  PROCALCITON  --   --   --  0.27 0.28 0.32 0.14  LATICACIDVEN 7.3* 4.4* 2.5*  --  1.4  --   --     Recent Results (from the past 240 hour(s))  Blood Culture (routine x 2)     Status: None (Preliminary result)   Collection Time: 08/24/21  4:02 PM   Specimen: Right Antecubital; Blood  Result Value Ref Range Status   Specimen Description   Final    RIGHT ANTECUBITAL BOTTLES DRAWN AEROBIC AND ANAEROBIC   Special Requests Blood Culture adequate volume  Final   Culture   Final    NO GROWTH 4 DAYS Performed at [facility], [address] [place], [place] [address]    Report Status PENDING  Incomplete  Blood Culture (routine x 2)     Status: None (Preliminary result)   Collection Time: 08/24/21  4:07 PM   Specimen: BLOOD LEFT FOREARM  Result Value Ref Range Status   Specimen Description   Final    BLOOD LEFT FOREARM BOTTLES DRAWN AEROBIC AND ANAEROBIC   Special Requests   Final    Blood Culture results may not be optimal due to an excessive volume of blood received in culture bottles   Culture   Final    NO GROWTH 4 DAYS Performed at [facility], [address], [place], [place] [address]    Report Status PENDING  Incomplete   Resp Panel by RT-PCR (Flu A&B, Covid) Nasopharyngeal Swab  Status: None   Collection Time: 08/24/21  5:52 PM   Specimen: Nasopharyngeal Swab; Nasopharyngeal(NP) swabs in vial transport medium  Result Value Ref Range Status   SARS Coronavirus 2 by RT PCR NEGATIVE NEGATIVE Final    Comment: (NOTE) SARS-CoV-2 target nucleic acids are NOT DETECTED.  The SARS-CoV-2 RNA is generally detectable in upper respiratory specimens during the acute phase of infection. The lowest concentration of SARS-CoV-2 viral copies this assay can detect is 138 copies/mL. A negative result does not preclude SARS-Cov-2 infection and should not be used as the sole basis for treatment or other patient management decisions. A negative result may occur with  improper specimen collection/handling, submission of specimen other than nasopharyngeal swab, presence of viral mutation(s) within the areas targeted by this assay, and inadequate number of viral copies(<138 copies/mL). A negative result must be combined with clinical observations, patient history, and epidemiological information. The expected result is Negative.  Fact Sheet for Patients:  EntrepreneurPulse.com.au  Fact Sheet for Healthcare Providers:  IncredibleEmployment.be  This test is no t yet approved or cleared by the [place] FDA and  has been authorized for detection and/or diagnosis of SARS-CoV-2 by FDA under an Emergency Use Authorization (EUA). This EUA will remain  in effect (meaning this test can be used) for the duration of the COVID-19 declaration under Section 564(b)(1) of the Act, 21 U.S.C.section 360bbb-3(b)(1), unless the authorization is terminated  or revoked sooner.       Influenza A by PCR NEGATIVE NEGATIVE Final   Influenza B by PCR NEGATIVE NEGATIVE Final    Comment: (NOTE) The Xpert Xpress SARS-CoV-2/FLU/RSV plus assay is intended as an aid in the diagnosis of influenza from  Nasopharyngeal swab specimens and should not be used as a sole basis for treatment. Nasal washings and aspirates are unacceptable for Xpert Xpress SARS-CoV-2/FLU/RSV testing.  Fact Sheet for Patients: EntrepreneurPulse.com.au  Fact Sheet for Healthcare Providers: IncredibleEmployment.be  This test is not yet approved or cleared by the [place] FDA and has been authorized for detection and/or diagnosis of SARS-CoV-2 by FDA under an Emergency Use Authorization (EUA). This EUA will remain in effect (meaning this test can be used) for the duration of the COVID-19 declaration under Section 564(b)(1) of the Act, 21 U.S.C. section 360bbb-3(b)(1), unless the authorization is terminated or revoked.  Performed at [facility], [address], [place], [place] [address]   MRSA Next Gen by PCR, Nasal     Status: Abnormal   Collection Time: 08/24/21  8:20 PM   Specimen: Nasal Mucosa; Nasal Swab  Result Value Ref Range Status   MRSA by PCR Next Gen DETECTED (A) NOT DETECTED Final    Comment: RESULT CALLED TO, READ BACK BY AND VERIFIED WITH: TINAJERO,E @ 3810 ON 08/25/21 BY JUW (NOTE) The GeneXpert MRSA Assay (FDA approved for NASAL specimens only), is one component of a comprehensive MRSA colonization surveillance program. It is not intended to diagnose MRSA infection nor to guide or monitor treatment for MRSA infections. Test performance is not FDA approved in patients less than 36 years old. Performed at [facility], [address] [place], [place] [address]   Urine Culture     Status: None   Collection Time: 08/25/21 12:28 AM   Specimen: In/Out Cath Urine  Result Value Ref Range Status   Specimen Description   Final    IN/OUT CATH URINE Performed at [facility], [address], [place], [place] [address]    Special Requests   Final  NONE Performed at [facility], [address] [place], [place] [address]    Culture   Final    NO  GROWTH Performed at [facility], [place] [address] [place], [place] [address]    Report Status 08/26/2021 FINAL  Final         Radiology Studies: NM Hepato W/EF  Result Date: 08/27/2021 CLINICAL DATA:  Abdominal pain. EXAM: NUCLEAR MEDICINE HEPATOBILIARY IMAGING WITH GALLBLADDER EF TECHNIQUE: Sequential images of the abdomen were obtained out to 60 minutes following intravenous administration of radiopharmaceutical. After oral ingestion of Ensure, gallbladder ejection fraction was determined. At 60 min, normal ejection fraction is greater than 33%. RADIOPHARMACEUTICALS:  mCi Tc-74m  Choletec IV COMPARISON:  None. FINDINGS: Prompt uptake and biliary excretion of activity by the liver is seen. Gallbladder activity is visualized, consistent with patency of cystic duct. Biliary activity passes into small bowel, consistent with patent common bile duct. Calculated gallbladder ejection fraction is 57%. (Normal gallbladder ejection fraction with Ensure is greater than 33%.) IMPRESSION: 1. Normal gallbladder ejection fraction. 2. Patent cystic duct and common bile duct. Electronically Signed   By: Suzy Bouchard M.D.   On: 08/27/2021 16:57   US Abdomen Limited RUQ (LIVER/GB)  Result Date: 08/26/2021 CLINICAL DATA:  Transaminitis. EXAM: ULTRASOUND ABDOMEN LIMITED RIGHT UPPER QUADRANT COMPARISON:  August 24, 2021.  August 27, 2020. FINDINGS: Gallbladder: No cholelithiasis is noted. Moderate to severe gallbladder wall thickening is noted measuring 7 mm. Mild ascites is noted. Common bile duct: Diameter: 6 mm which is within normal limits. Liver: No focal lesion identified. Mildly increased echogenicity of hepatic parenchyma is noted suggesting hepatic steatosis or other diffuse hepatocellular disease. Portal vein is patent on color Doppler imaging with normal direction of blood flow towards the liver. Other: None. IMPRESSION: Mildly increased echogenicity of hepatic parenchyma is noted suggesting  hepatic steatosis or other diffuse hepatocellular disease. Moderate to severe gallbladder wall thickening is noted without cholelithiasis. It is uncertain if this is due to cholecystitis or adjacent hepatocellular disease. Mild ascites is noted in the right upper quadrant. No definite biliary dilatation is noted. Electronically Signed   By: Marijo Conception M.D.   On: 08/26/2021 11:53        Scheduled Meds:  Chlorhexidine Gluconate Cloth  6 each Topical Q0600   dextrose       digoxin  0.0625 mg Oral Daily   enoxaparin (LOVENOX) injection  100 mg Subcutaneous Q12H   finasteride  5 mg Oral Daily   furosemide  40 mg Intravenous Q12H   insulin aspart  0-9 Units Subcutaneous Q4H   insulin detemir  10 Units Subcutaneous QHS   midodrine  15 mg Oral TID WC   mupirocin ointment  1 application Nasal BID   pantoprazole (PROTONIX) IV  40 mg Intravenous Q12H   sucralfate  1 g Oral QID   traZODone  50 mg Oral QHS   umeclidinium bromide  1 puff Inhalation Daily   Continuous Infusions:  sodium chloride     norepinephrine (LEVOPHED) Adult infusion Stopped (08/26/21 1043)     LOS: 4 days    Time spent: 35 minutes    Jadon Harbaugh Darleen Crocker, DO [facility]  If 7PM-7AM, please contact night-coverage [email or web address] 08/28/2021, 9:19 AM

## 2021-08-28 NOTE — Progress Notes (Signed)
**Note De-Identified  Obfuscation** Patient removed from BIPAP and placed on 2 l [place] prior to RT assessment. Patient tolerating well. RRT to continue to monitor.

## 2021-08-28 NOTE — Progress Notes (Signed)
Physical Therapy Treatment Patient Details Name: Perry Hunter MRN: [id number] DOB: August 22, 1950 Today's Date: 08/28/2021   History of Present Illness Perry Hunter is a 71 y.o. male presents with increasing confusion, shortness of breath, fever. Pt admitted 1/27 with sepsis. PMH: diabetes, CHF, hypertension, COPD, paroxysmal atrial fibrillation on anticoagulation, BPH, obstructive sleep apnea not on CPAP.    PT Comments    Pt reports feeling weak, questioning ability to stand EOB but agreeable to try with therapy. Pt requires mod A to upright trunk into sitting pulling on therapist's hand with RUE to upright trunk into sitting EOB. Once sitting EOB pt requires seated rest break, denies pain or dizziness, just reports weakness; using UE to support self while seated EOB and min guard from therapist. Pt powers to stand with single UE support, min A to power up and pivot to recliner at bedside, increased time to complete. Pt requires significantly increased time with bed mobility and transfer. Educated pt on hand placement and sequencing with transfers, therapist managing lines for safety. Pt unable to take steps at EOB, reports too weak to take steps today. Updated d/c recommendation to SNF due to pt requiring increased assist with limited mobility this session; if pt able to provide current level of assist, will need HHPT. Chaplain in room with pt's family at [place], pt up in chair with call bell in lap.   Recommendations for follow up therapy are one component of a multi-disciplinary discharge planning process, led by the attending physician.  Recommendations may be updated based on patient status, additional functional criteria and insurance authorization.  Follow Up Recommendations  Skilled nursing-short term rehab (<3 hours/day) (unless family able to provide current level of care, then recommend HHPT)     Assistance Recommended at Discharge Frequent or constant Supervision/Assistance  Patient can  return home with the following A lot of help with walking and/or transfers;A lot of help with bathing/dressing/bathroom;Assistance with cooking/housework;Assist for transportation;Help with stairs or ramp for entrance   Equipment Recommendations  Rolling walker (2 wheels)    Recommendations for Other Services       Precautions / Restrictions Precautions Precautions: Fall Restrictions Weight Bearing Restrictions: No     Mobility  Bed Mobility Overal bed mobility: Needs Assistance Bed Mobility: Supine to Sit  Supine to sit: Mod assist, HOB elevated  General bed mobility comments: increased time, VC for sequencing, inches BLE over to EOB, pt pulling on therapist's hand to upright trunk into sitting    Transfers Overall transfer level: Needs assistance Equipment used: 1 person hand held assist Transfers: Sit to/from Stand, Bed to chair/wheelchair/BSC Sit to Stand: Min assist Stand pivot transfers: Min assist  General transfer comment: min A to steady with rising from EOB, reaches for chair armrest to steady self with pivoting over to recliner, increasead time    Ambulation/Gait  General Gait Details: pt declines due to weakness complaints   Stairs             Wheelchair Mobility    Modified Rankin (Stroke Patients Only)       Balance Overall balance assessment: Needs assistance Sitting-balance support: Feet supported, No upper extremity supported Sitting balance-Leahy Scale: Fair Sitting balance - Comments: seated EOB   Standing balance support: During functional activity, Bilateral upper extremity supported Standing balance-Leahy Scale: Poor Standing balance comment: reliant on UE support     Cognition Arousal/Alertness: Awake/alert Behavior During Therapy: WFL for tasks assessed/performed Overall Cognitive Status: Within Functional Limits for tasks assessed  Exercises      General Comments        Pertinent Vitals/Pain Pain Assessment Pain  Assessment: No/denies pain    Home Living                          Prior Function            PT Goals (current goals can now be found in the care plan section) Acute Rehab PT Goals Patient Stated Goal: return home with family to assist PT Goal Formulation: With patient/family Time For Goal Achievement: 09/03/21 Potential to Achieve Goals: Good Progress towards PT goals: Progressing toward goals    Frequency    Min 3X/week      PT Plan Discharge plan needs to be updated    Co-evaluation              AM-PAC PT "6 Clicks" Mobility   Outcome Measure  Help needed turning from your back to your side while in a flat bed without using bedrails?: A Little Help needed moving from lying on your back to sitting on the side of a flat bed without using bedrails?: A Lot Help needed moving to and from a bed to a chair (including a wheelchair)?: A Little Help needed standing up from a chair using your arms (e.g., wheelchair or bedside chair)?: A Little Help needed to walk in hospital room?: Total Help needed climbing 3-5 steps with a railing? : Total 6 Click Score: 13    End of Session Equipment Utilized During Treatment: Gait belt Activity Tolerance: Patient tolerated treatment well;Patient limited by fatigue Patient left: in chair;with call bell/phone within reach;with family/visitor present (chaplain in room) Nurse Communication: Mobility status;Other (comment) (RN confirmed CVP placement prior to pt transferring and once pt up in recliner) PT Visit Diagnosis: Unsteadiness on feet (R26.81);Other abnormalities of gait and mobility (R26.89);Muscle weakness (generalized) (M62.81)     Time: 4166-0630 PT Time Calculation (min) (ACUTE ONLY): 23 min  Charges:  $Therapeutic Activity: 23-37 mins                      Tori Ruberta Holck PT, DPT 08/28/21, 1:12 PM

## 2021-08-28 NOTE — NC FL2 (Signed)
[place] LEVEL OF CARE SCREENING TOOL     IDENTIFICATION  Patient Name: Perry Hunter Birthdate: 13-Sep-1950 Sex: male Admission Date (Current Location): 08/24/2021  [place] and [place] Number:  [place] [facility] [id number] [place] and Address:  [name] [address], [place]      Provider Number: [id number]  Attending Physician Name and Address:  Rodena Goldmann, DO  Relative Name and Phone Number:  Rigoberto Noel (Daughter)   [phone]    Current Level of Care: Hospital Recommended Level of Care: [place] Prior Approval Number:    Date Approved/Denied:   PASRR Number: [id number]  Discharge Plan: SNF    Current Diagnoses: Patient Active Problem List   Diagnosis Date Noted   Sepsis ([place]) 08/24/2021   Elevated LFTs 08/24/2021   AKI (acute kidney injury) ([place]) 27/74/1287   Acute metabolic encephalopathy 86/76/7209   Persistent atrial fibrillation (HCC)    PAF (paroxysmal atrial fibrillation) ([place])    Acute on chronic combined systolic and diastolic CHF (congestive heart failure) ([place])    Cardiogenic shock ([place]) 08/26/2020   GI bleed 08/25/2020   Symptomatic anemia 08/25/2020   Microcytic anemia 08/25/2020   Elevated brain natriuretic peptide (BNP) level 08/25/2020   Hypotension 08/25/2020   Hyponatremia 08/25/2020   Chronic kidney disease 08/25/2020   Essential hypertension 08/25/2020   Hyperlipidemia 08/25/2020   GERD (gastroesophageal reflux disease) 08/25/2020   COPD (chronic obstructive pulmonary disease) ([place]) 08/25/2020   Diabetes mellitus ([place]) 08/25/2020   Obesity 08/25/2020    Orientation RESPIRATION BLADDER Height & Weight     Self, Time, Situation, Place  O2 (2L) Continent Weight: 247 lb 2.2 oz (112.1 kg) Height:  5\' 8"  (172.7 cm)  BEHAVIORAL SYMPTOMS/MOOD NEUROLOGICAL BOWEL NUTRITION STATUS      Continent  (see d/c summary)  AMBULATORY STATUS COMMUNICATION OF NEEDS Skin   Extensive Assist  Verbally Normal                       Personal Care Assistance Level of Assistance  Bathing, Dressing, Feeding Bathing Assistance: Limited assistance Feeding assistance: Independent Dressing Assistance: Limited assistance     Functional Limitations Info  Sight, Hearing, Speech Sight Info: Adequate Hearing Info: Adequate Speech Info: Adequate    SPECIAL CARE FACTORS FREQUENCY  PT (By licensed PT)     PT Frequency: 5x/week              Contractures Contractures Info: Not present    Additional Factors Info  Code Status, Allergies Code Status Info: DNR Allergies Info: NKA           Current Medications (08/28/2021):  This is the current hospital active medication list Current Facility-Administered Medications  Medication Dose Route Frequency Provider Last Rate Last Admin   0.9 %  sodium chloride infusion  250 mL Intravenous Continuous Henri Medal, RPH       albuterol (PROVENTIL) (2.5 MG/3ML) 0.083% nebulizer solution 3 mL  3 mL Inhalation QID PRN Truett Mainland, DO   3 mL at 08/27/21 2204   Chlorhexidine Gluconate Cloth 2 % PADS 6 each  6 each Topical Q0600 Truett Mainland, DO   6 each at 08/28/21 0411   dextrose 50 % solution            digoxin (LANOXIN) tablet 0.0625 mg  0.0625 mg Oral Daily Truett Main[place], DO   0.0625 mg at 08/28/21 0933   enoxaparin (LOVENOX) injection 100 mg  100 mg  Subcutaneous Q12H Heath Lark D, DO   100 mg at 08/28/21 9381   finasteride (PROSCAR) tablet 5 mg  5 mg Oral Daily Truett Mainland, DO   5 mg at 08/28/21 8299   furosemide (LASIX) injection 40 mg  40 mg Intravenous Q12H Shah, Pratik D, DO   40 mg at 08/28/21 0841   HYDROcodone-acetaminophen (NORCO/VICODIN) 5-325 MG per tablet 1 tablet  1 tablet Oral TID PRN Truett Main[place], DO   1 tablet at 08/28/21 1033   insulin aspart (novoLOG) injection 0-9 Units  0-9 Units Subcutaneous Q4H Shah, Pratik D, DO       insulin detemir (LEVEMIR) injection 10 Units  10 Units Subcutaneous QHS  Manuella Ghazi, Pratik D, DO       LORazepam (ATIVAN) injection 1 mg  1 mg Intravenous Q4H PRN Truett Mainland, DO   1 mg at 08/26/21 0011   midodrine (PROAMATINE) tablet 15 mg  15 mg Oral TID WC Truett Mainland, DO   15 mg at 08/28/21 1239   mupirocin ointment (BACTROBAN) 2 % 1 application  1 application Nasal BID Adefeso, Oladapo, DO   1 application at 37/16/96 0934   norepinephrine (LEVOPHED) 4mg  in 275mL (0.016 mg/mL) premix infusion  2-10 mcg/min Intravenous Titrated Henri Medal, RPH   Stopped at 08/26/21 1043   ondansetron (ZOFRAN) tablet 4 mg  4 mg Oral Q6H PRN Truett Main[place], DO       Or   ondansetron [facility]) injection 4 mg  4 mg Intravenous Q6H PRN Truett Mainland, DO       pantoprazole (PROTONIX) injection 40 mg  40 mg Intravenous Q12H Truett Mainland, DO   40 mg at 08/28/21 0934   sucralfate (CARAFATE) 1 GM/10ML suspension 1 g  1 g Oral QID Truett Mainland, DO   1 g at 08/28/21 7893   traZODone (DESYREL) tablet 50 mg  50 mg Oral QHS Shah, Pratik D, DO   50 mg at 08/27/21 2239   umeclidinium bromide (INCRUSE ELLIPTA) 62.5 MCG/ACT 1 puff  1 puff Inhalation Daily Truett Mainland, DO   1 puff at 08/28/21 0750     Discharge Medications: Please see discharge summary for a list of discharge medications.  Relevant Imaging Results:  Relevant Lab Results:   Additional Information SSN [id number]. Patient is NOT vaccinated for COVID.  Arlenne Kimbley, Clydene Pugh, LCSW

## 2021-08-28 NOTE — TOC Progression Note (Signed)
Transition of Care [facility]) - Progression Note    Patient Details  Name: Perry Hunter MRN: [id number] Date of Birth: Feb 11, 1951  Transition of Care [facility]) CM/SW Contact  Ihor Gully, LCSW Phone Number: 08/28/2021, 2:12 PM  Clinical Narrative:    Physical changed patient's recommendation from HHPT to SNF. Discussed recommendation with patinet, daughter, [name], and granddaughter at bedside. Discussed bed choices. Patient is considering SNF. Referrals sent to facilities request and authorization started.  Patient is not vaccinated for COVID.    Expected Discharge Plan: [place] Barriers to Discharge: Continued Medical Work up  Expected Discharge Plan and Services Expected Discharge Plan: [place] In-house Referral: Clinical Social Work   Post Acute Care Choice: [place] arrangements for the past 2 months: [place]: RN, PT [place] Agency: [place] ([name]) Date [place]: 08/27/21 Time [place]: [place] Representative spoke with at [place]: [place] Determinants of Health ([place]) Interventions    Readmission Risk Interventions Readmission Risk Prevention Plan 08/27/2021  Transportation Screening Complete  Medication Review Press photographer) Complete  HRI or [place] Complete  SW Recovery Care/Counseling Consult Complete  [place] Not Applicable  Some recent data might be hidden

## 2021-08-29 DIAGNOSIS — G9341 Metabolic encephalopathy: Secondary | ICD-10-CM | POA: Diagnosis not present

## 2021-08-29 DIAGNOSIS — K219 Gastro-esophageal reflux disease without esophagitis: Secondary | ICD-10-CM

## 2021-08-29 DIAGNOSIS — E118 Type 2 diabetes mellitus with unspecified complications: Secondary | ICD-10-CM

## 2021-08-29 DIAGNOSIS — R5381 Other malaise: Secondary | ICD-10-CM

## 2021-08-29 DIAGNOSIS — I5023 Acute on chronic systolic (congestive) heart failure: Secondary | ICD-10-CM | POA: Diagnosis not present

## 2021-08-29 DIAGNOSIS — Z794 Long term (current) use of insulin: Secondary | ICD-10-CM

## 2021-08-29 DIAGNOSIS — J449 Chronic obstructive pulmonary disease, unspecified: Secondary | ICD-10-CM | POA: Diagnosis not present

## 2021-08-29 DIAGNOSIS — N179 Acute kidney failure, unspecified: Secondary | ICD-10-CM | POA: Diagnosis not present

## 2021-08-29 LAB — CULTURE, BLOOD (ROUTINE X 2)
Culture: NO GROWTH
Culture: NO GROWTH
Special Requests: ADEQUATE

## 2021-08-29 LAB — CBC
HCT: 28.7 % — ABNORMAL LOW (ref 39.0–52.0)
Hemoglobin: 7.7 g/dL — ABNORMAL LOW (ref 13.0–17.0)
MCH: 15.8 pg — ABNORMAL LOW (ref 26.0–34.0)
MCHC: 26.8 g/dL — ABNORMAL LOW (ref 30.0–36.0)
MCV: 59.1 fL — ABNORMAL LOW (ref 80.0–100.0)
Platelets: 397 10*3/uL (ref 150–400)
RBC: 4.86 MIL/uL (ref 4.22–5.81)
RDW: 23.4 % — ABNORMAL HIGH (ref 11.5–15.5)
WBC: 9.2 10*3/uL (ref 4.0–10.5)
nRBC: 1 % — ABNORMAL HIGH (ref 0.0–0.2)

## 2021-08-29 LAB — GLUCOSE, CAPILLARY
Glucose-Capillary: 88 mg/dL (ref 70–99)
Glucose-Capillary: 78 mg/dL (ref 70–99)
Glucose-Capillary: 80 mg/dL (ref 70–99)
Glucose-Capillary: 86 mg/dL (ref 70–99)
Glucose-Capillary: 138 mg/dL — ABNORMAL HIGH (ref 70–99)

## 2021-08-29 LAB — COMPREHENSIVE METABOLIC PANEL
ALT: 132 U/L — ABNORMAL HIGH (ref 0–44)
AST: 167 U/L — ABNORMAL HIGH (ref 15–41)
Albumin: 2.7 g/dL — ABNORMAL LOW (ref 3.5–5.0)
Alkaline Phosphatase: 100 U/L (ref 38–126)
Anion gap: 6 (ref 5–15)
BUN: 25 mg/dL — ABNORMAL HIGH (ref 8–23)
CO2: 30 mmol/L (ref 22–32)
Calcium: 7.8 mg/dL — ABNORMAL LOW (ref 8.9–10.3)
Chloride: 99 mmol/L (ref 98–111)
Creatinine, Ser: 1.09 mg/dL (ref 0.61–1.24)
GFR, Estimated: >60 mL/min (ref >60)
Glucose, Bld: 86 mg/dL (ref 70–99)
Potassium: 3.2 mmol/L — ABNORMAL LOW (ref 3.5–5.1)
Sodium: 135 mmol/L (ref 135–145)
Total Bilirubin: 1.6 mg/dL — ABNORMAL HIGH (ref 0.3–1.2)
Total Protein: 6.4 g/dL — ABNORMAL LOW (ref 6.5–8.1)

## 2021-08-29 LAB — COOXEMETRY PANEL
Carboxyhemoglobin: 1.9 % — ABNORMAL HIGH (ref 0.5–1.5)
Methemoglobin: 1.3 % (ref 0.0–1.5)
O2 Saturation: 61.9 %
Total hemoglobin: 8.3 g/dL — ABNORMAL LOW (ref 12.0–16.0)

## 2021-08-29 LAB — MAGNESIUM: Magnesium: 1.8 mg/dL (ref 1.7–2.4)

## 2021-08-29 MED ORDER — POTASSIUM CHLORIDE CRYS ER 20 MEQ PO TBCR
40.0000 meq | EXTENDED_RELEASE_TABLET | Freq: Once | ORAL | Status: AC
  Administered 2021-08-29: 40 meq via ORAL
  Filled 2021-08-29: qty 2

## 2021-08-29 MED ORDER — IPRATROPIUM-ALBUTEROL 0.5-2.5 (3) MG/3ML IN SOLN
3.0000 mL | Freq: Four times a day (QID) | RESPIRATORY_TRACT | Status: AC
  Administered 2021-08-29 (×2): 3 mL via RESPIRATORY_TRACT
  Filled 2021-08-29 (×2): qty 3

## 2021-08-29 MED ORDER — MAGNESIUM SULFATE 2 GM/50ML IV SOLN
2.0000 g | Freq: Once | INTRAVENOUS | Status: AC
  Administered 2021-08-29: 2 g via INTRAVENOUS
  Filled 2021-08-29: qty 50

## 2021-08-29 MED ORDER — POTASSIUM CHLORIDE CRYS ER 20 MEQ PO TBCR
20.0000 meq | EXTENDED_RELEASE_TABLET | Freq: Every day | ORAL | Status: DC
  Administered 2021-08-29 – 2021-08-30 (×2): 20 meq via ORAL
  Filled 2021-08-29 (×2): qty 1

## 2021-08-29 MED ORDER — TAMSULOSIN HCL 0.4 MG PO CAPS
0.4000 mg | ORAL_CAPSULE | Freq: Every day | ORAL | Status: DC
  Administered 2021-08-29 – 2021-09-03 (×6): 0.4 mg via ORAL
  Filled 2021-08-29 (×6): qty 1

## 2021-08-29 NOTE — Progress Notes (Signed)
Inpatient Diabetes Program Recommendations  AACE/ADA: New Consensus Statement on Inpatient Glycemic Control (2015)  Target Ranges:  Prepandial:   less than 140 mg/dL      Peak postprandial:   less than 180 mg/dL (1-2 hours)      Critically ill patients:  140 - 180 mg/dL   Lab Results  Component Value Date   GLUCAP 78 08/29/2021   HGBA1C 6.3 (H) 08/24/2021    Review of Glycemic Control  Latest Reference Range & Units 08/28/21 11:29 08/28/21 16:37 08/28/21 19:20 08/28/21 23:14 08/28/21 23:38 08/29/21 04:34 08/29/21 07:35  Glucose-Capillary 70 - 99 mg/dL 105 (H) 70 83 69 (L) 87 88 78  Diabetes history: DM2 Outpatient Diabetes medications: Levemir 40 units QHS, Metformin 1000 mg QAM, Januvia 100 mg daily Current orders for Inpatient glycemic control: Levemir 10 units QHS, Novolog 0-9 units Q4H   Inpatient Diabetes Program Recommendations:    Please d/c Levemir for now.  Also consider reducing Novolog correction to 0-6 units q 4 hours.   Thanks,  Adah Perl, RN, BC-ADM Inpatient Diabetes Coordinator Pager [phone]  (8a-5p)

## 2021-08-29 NOTE — Progress Notes (Signed)
Progress Note  Patient Name: Perry Hunter Date of Encounter: 08/29/2021  [facility] Cardiologist: Bensimhon  Subjective   Some ongoing SOB  Inpatient Medications    Scheduled Meds:  Chlorhexidine Gluconate Cloth  6 each Topical Q0600   digoxin  0.0625 mg Oral Daily   enoxaparin (LOVENOX) injection  100 mg Subcutaneous Q12H   finasteride  5 mg Oral Daily   furosemide  40 mg Intravenous Q12H   insulin aspart  0-9 Units Subcutaneous Q4H   insulin detemir  10 Units Subcutaneous QHS   midodrine  15 mg Oral TID WC   pantoprazole (PROTONIX) IV  40 mg Intravenous Q12H   sucralfate  1 g Oral QID   traZODone  50 mg Oral QHS   umeclidinium bromide  1 puff Inhalation Daily   Continuous Infusions:  sodium chloride 10 mL/hr at 08/29/21 0438   magnesium sulfate bolus IVPB     norepinephrine (LEVOPHED) Adult infusion Stopped (08/26/21 1043)   PRN Meds: albuterol, HYDROcodone-acetaminophen, LORazepam, ondansetron **OR** ondansetron (ZOFRAN) IV   Vital Signs    Vitals:   08/29/21 0400 08/29/21 0438 08/29/21 0737 08/29/21 0821  BP: (!) 118/93     Pulse:      Resp: (!) 22  18   Temp:  (!) 97.2 F (36.2 C) (!) 97.2 F (36.2 C)   TempSrc:  Oral Axillary   SpO2: 98%   100%  Weight:  107.4 kg    Height:        Intake/Output Summary (Last 24 hours) at 08/29/2021 0856 Last data filed at 08/29/2021 0438 Gross per 24 hour  Intake 593.5 ml  Output 1800 ml  Net -1206.5 ml   Last 3 Weights 08/29/2021 08/28/2021 08/27/2021  Weight (lbs) 236 lb 12.4 oz 247 lb 2.2 oz 241 lb 10 oz  Weight (kg) 107.4 kg 112.1 kg 109.6 kg      Telemetry    Rate controlled afib - Personally Reviewed  ECG    N/a - Personally Reviewed  Physical Exam   GEN: No acute distress.   Neck: No JVD Cardiac: irreg Respiratory: bilateral wheezing GI: Soft, nontender, non-distended  MS: 1+ bilateral LE edema Neuro:  Nonfocal  Psych: Normal affect   Labs    High Sensitivity Troponin:  No results for  input(s): TROPONINIHS in the last 720 hours.   Chemistry Recent Labs  Lab 08/27/21 0243 08/28/21 0354 08/29/21 0347  NA 133* 133* 135  K 4.6 4.1 3.2*  CL 100 100 99  CO2 26 29 30   GLUCOSE 85 47* 86  BUN 38* 31* 25*  CREATININE 1.51* 1.06 1.09  CALCIUM 7.8* 7.5* 7.8*  MG 1.9 2.1 1.8  PROT 6.5 6.4* 6.4*  ALBUMIN 2.7* 2.6* 2.7*  AST 413* 273* 167*  ALT 207* 173* 132*  ALKPHOS 103 102 100  BILITOT 1.6* 1.4* 1.6*  GFRNONAA 49* >60 >60  ANIONGAP 7 4* 6    Lipids No results for input(s): CHOL, TRIG, HDL, LABVLDL, LDLCALC, CHOLHDL in the last 168 hours.  Hematology Recent Labs  Lab 08/27/21 0243 08/28/21 0354 08/29/21 0347  WBC 9.1 8.7 9.2  RBC 5.00 5.01 4.86  HGB 8.3* 7.8* 7.7*  HCT 30.1* 29.8* 28.7*  MCV 60.2* 59.5* 59.1*  MCH 16.6* 15.6* 15.8*  MCHC 27.6* 26.2* 26.8*  RDW 23.6* 23.6* 23.4*  PLT 290 360 397   Thyroid  Recent Labs  Lab 08/24/21 1610  TSH 1.380    BNP Recent Labs  Lab 08/28/21 0354  BNP  1,346.0*    DDimer No results for input(s): DDIMER in the last 168 hours.   Radiology    NM Hepato W/EF  Result Date: 08/27/2021 CLINICAL DATA:  Abdominal pain. EXAM: NUCLEAR MEDICINE HEPATOBILIARY IMAGING WITH GALLBLADDER EF TECHNIQUE: Sequential images of the abdomen were obtained out to 60 minutes following intravenous administration of radiopharmaceutical. After oral ingestion of Ensure, gallbladder ejection fraction was determined. At 60 min, normal ejection fraction is greater than 33%. RADIOPHARMACEUTICALS:  mCi Tc-52m  Choletec IV COMPARISON:  None. FINDINGS: Prompt uptake and biliary excretion of activity by the liver is seen. Gallbladder activity is visualized, consistent with patency of cystic duct. Biliary activity passes into small bowel, consistent with patent common bile duct. Calculated gallbladder ejection fraction is 57%. (Normal gallbladder ejection fraction with Ensure is greater than 33%.) IMPRESSION: 1. Normal gallbladder ejection fraction. 2.  Patent cystic duct and common bile duct. Electronically Signed   By: Suzy Bouchard M.D.   On: 08/27/2021 16:57   ECHOCARDIOGRAM COMPLETE  Result Date: 08/28/2021    ECHOCARDIOGRAM REPORT   Patient Name:   Perry Hunter Date of Exam: 08/28/2021 Medical Rec #:  [id number]     Height:       68.0 in Accession #:    [id number]    Weight:       247.1 lb Date of Birth:  [birth year]     BSA:          2.236 m Patient Age:    50 years      BP:           140/92 mmHg Patient Gender: M             HR:           90 bpm. Exam Location:  [facility] Procedure: 2D Echo, Cardiac Doppler, Color Doppler and Intracardiac            Opacification Agent Indications:    CHF  History:        Patient has prior history of Echocardiogram examinations, most                 recent 08/26/2020. CHF, COPD, Arrythmias:Atrial Fibrillation;                 Risk Factors:Hypertension, Diabetes, Dyslipidemia and Former                 Smoker.  Sonographer:    Wenda Low Referring Phys: [id number] [facility]  Sonographer Comments: Patient is morbidly obese. Image acquisition challenging due to COPD. IMPRESSIONS  1. Left ventricular ejection fraction, by estimation, is 20 to 25%. The left ventricle has severely decreased function. The left ventricle demonstrates global hypokinesis. The left ventricular internal cavity size was moderately dilated. Left ventricular diastolic parameters are indeterminate. Elevated left atrial pressure.  2. Right ventricular systolic function is mildly reduced. The right ventricular size is moderately enlarged. There is moderately elevated pulmonary artery systolic pressure.  3. Left atrial size was severely dilated.  4. Right atrial size was severely dilated.  5. Moderate pericardial effusion. The pericardial effusion is circumferential. There is no evidence of cardiac tamponade.  6. The mitral valve is normal in structure. Trivial mitral valve regurgitation. No evidence of mitral stenosis.  7. The aortic valve is  tricuspid. There is moderate calcification of the aortic valve. There is moderate thickening of the aortic valve. Aortic valve regurgitation is mild.  8. Aortic dilatation noted. There is mild dilatation of the ascending  aorta, measuring 37 mm.  9. The inferior vena cava is dilated in size with >50% respiratory variability, suggesting right atrial pressure of 8 mmHg. FINDINGS  Left Ventricle: Left ventricular ejection fraction, by estimation, is 20 to 25%. The left ventricle has severely decreased function. The left ventricle demonstrates global hypokinesis. Definity contrast agent was given IV to delineate the left ventricular endocardial borders. The left ventricular internal cavity size was moderately dilated. There is no left ventricular hypertrophy. Left ventricular diastolic parameters are indeterminate. Elevated left atrial pressure. Right Ventricle: The right ventricular size is moderately enlarged. Right vetricular wall thickness was not well visualized. Right ventricular systolic function is mildly reduced. There is moderately elevated pulmonary artery systolic pressure. The tricuspid regurgitant velocity is 2.91 m/s, and with an assumed right atrial pressure of 15 mmHg, the estimated right ventricular systolic pressure is 78.2 mmHg. Left Atrium: Left atrial size was severely dilated. Right Atrium: Right atrial size was severely dilated. Pericardium: A moderately sized pericardial effusion is present. The pericardial effusion is circumferential. There is no evidence of cardiac tamponade. Mitral Valve: The mitral valve is normal in structure. There is mild thickening of the mitral valve leaflet(s). There is mild calcification of the mitral valve leaflet(s). Mild mitral annular calcification. Trivial mitral valve regurgitation. No evidence  of mitral valve stenosis. MV peak gradient, 9.7 mmHg. The mean mitral valve gradient is 3.0 mmHg. Tricuspid Valve: The tricuspid valve is not well visualized. Tricuspid  valve regurgitation is mild . No evidence of tricuspid stenosis. Aortic Valve: The aortic valve is tricuspid. There is moderate calcification of the aortic valve. There is moderate thickening of the aortic valve. Aortic valve regurgitation is mild. Aortic valve mean gradient measures 5.5 mmHg. Aortic valve peak gradient measures 8.6 mmHg. Aortic valve area, by VTI measures 1.62 cm. Pulmonic Valve: The pulmonic valve was not well visualized. Pulmonic valve regurgitation is not visualized. No evidence of pulmonic stenosis. Aorta: The aortic root is normal in size and structure and aortic dilatation noted. There is mild dilatation of the ascending aorta, measuring 37 mm. Venous: The inferior vena cava is dilated in size with greater than 50% respiratory variability, suggesting right atrial pressure of 8 mmHg. IAS/Shunts: No atrial level shunt detected by color flow Doppler.  LEFT VENTRICLE PLAX 2D LVIDd:         6.50 cm   Diastology LVIDs:         5.40 cm   LV e' lateral:   7.83 cm/s LV PW:         1.10 cm   LV E/e' lateral: 16.2 LV IVS:        0.90 cm LVOT diam:     2.10 cm LV SV:         46 LV SV Index:   21 LVOT Area:     3.46 cm  RIGHT VENTRICLE RV Basal diam:  3.55 cm RV Mid diam:    3.30 cm RV S prime:     11.40 cm/s TAPSE (M-mode): 1.9 cm LEFT ATRIUM              Index        RIGHT ATRIUM           Index LA diam:        6.00 cm  2.68 cm/m   RA Area:     30.60 cm LA Vol (A2C):   117.0 ml 52.32 ml/m  RA Volume:   109.00 ml 48.75 ml/m LA Vol (A4C):  111.0 ml 49.64 ml/m LA Biplane Vol: 118.0 ml 52.77 ml/m  AORTIC VALVE                     PULMONIC VALVE AV Area (Vmax):    1.71 cm      PV Vmax:       0.67 m/s AV Area (Vmean):   1.58 cm      PV Peak grad:  1.8 mmHg AV Area (VTI):     1.62 cm AV Vmax:           147.00 cm/s AV Vmean:          106.000 cm/s AV VTI:            0.285 m AV Peak Grad:      8.6 mmHg AV Mean Grad:      5.5 mmHg LVOT Vmax:         72.40 cm/s LVOT Vmean:        48.450 cm/s LVOT VTI:           0.133 m LVOT/AV VTI ratio: 0.47  AORTA Ao Root diam: 3.60 cm Ao Asc diam:  3.70 cm MITRAL VALVE                TRICUSPID VALVE MV Area (PHT): 3.76 cm     TR Peak grad:   33.9 mmHg MV Peak grad:  9.7 mmHg     TR Vmax:        291.00 cm/s MV Mean grad:  3.0 mmHg MV Vmax:       1.56 m/s     SHUNTS MV Vmean:      66.8 cm/s    Systemic VTI:  0.13 m MV Decel Time: 202 msec     Systemic Diam: 2.10 cm MV E velocity: 127.00 cm/s Carlyle Dolly MD Electronically signed by Carlyle Dolly MD Signature Date/Time: 08/28/2021/1:15:22 PM    Final     Cardiac Studies     Patient Profile       Perry Hunter is a 71 y.o. male with a hx of chronic sysotlic HF, CAD, afib who is being seen 08/28/2021 for the evaluation of SOB at the request of Dr Manuella Ghazi.  Assessment & Plan    1.Acute on chronic systolic HF - Jan 4696 echo LVE 20-25%, grade III dd, severe RV dysfunction,  - 08/2020 RHC/LHC: prox RCA 99%, mid LA D 40%, prox LAD 30%, mid LCX 100% and distal 90% - from notes thought secondary to CAD, afib/tachymediated, and perhaps EtOH.  -historically medical therapy has been limited by low bp's     -presented with shock, iniitally thought as septic shock managed by primary team - aggressive IVFs, +4L by charting - BNP up to 1346 and f/u CXR with pulm edema - COOX yesterday 69%, today 62%   - started on IV lasix yesterday. I/Os incomplete, roughly negatve 1.1 L yesterday. Still net positive for admission. REnal function is stable. Continue IV lasix today.  - with normal coox and appropriate diuresis would not need consideration for inotropic therapy.      2.Shock - lactic acid 7.3-->4.4-->2.5-->1.4 -Pro calc 0.27-->0.28-->0.32-->0.14 - multiorgnan failure with AKI, transaminitis, AMS - COOX checked later in admission once bp's had stabilized, 62-69%   -initially managed as septic shock, broad spectrum and aggressive IVFs. Have not found clear source.  - transiently on norepi now off, continues on his  chronic home midodrine for chronic hypotension - BP's and lactic acidosis have improved -  shock has resolved.      3.AKI - admitted with Cr 1.71, baseline 1.2 - likely secondary to shock, hypotensin - Cr has normalized.     4.Chronic hypotension - he is on high dose midodrine at home.    5.Hypercapneic respiratory failure - improved with bipap, now off. Just on 2L [place] - ongoing wheezing, wrote for 3 scheduled doses of duoneb today   6.Afib - eliquis on hold, on lovenox in ICU setting - on just oral digoxin at home for rate control, rates are reasonable at this time.       For questions or updates, please contact [place] Please consult [email or web address] for contact info under        Signed, Carlyle Dolly, MD  08/29/2021, 8:56 AM

## 2021-08-29 NOTE — Assessment & Plan Note (Addendum)
-  Sepsis has been ruled out -Patient presented with SIRS /cardiogenic shock at time of admission -No source of infection found -SIRS features has resolved -At this moment transitioning to full comfort care and symptomatic management only.

## 2021-08-29 NOTE — Progress Notes (Addendum)
Progress Note   Patient: Perry Hunter AVW:[id number] DOB: April 20, 1951 DOA: 08/24/2021     5 DOS: the patient was seen and examined on 08/29/2021   Brief hospital course: Perry Hunter is a 71 y.o. male with a history of diabetes, CHF, hypertension, COPD, paroxysmal atrial fibrillation on anticoagulation, BPH, obstructive sleep apnea not on CPAP.  Patient brought to the hospital for increasing confusion, shortness of breath, fever.  Patient was admitted with initial concerns for septic shock with unknown source with associated metabolic encephalopathy and AKI.  However, it appears that he may not have initially been septic and simply presented with shock-possibly cardiogenic.  No source of infection has been found and therefore antibiotics have been discontinued 1/31.  He was thought to have possible acute cholecystitis during the course of this hospitalization as well, however HIDA scan has come back negative.  He now appears quite volume overloaded and likely has acute on chronic systolic CHF exacerbation and has been started on IV Lasix with cardiology following.  Assessment and Plan: * Acute on chronic systolic CHF (congestive heart failure) (HCC) -Ejection fraction 20-25% -Follow cardiology service recommendation -Continue to follow daily weights and strict I's and O's -Diet has been advanced to heart healthy/low-sodium. -Continue IV diuresis and follow clinical response.  Type 2 diabetes mellitus with complication, with long-term current use of insulin (HCC) -currently with hypoglycemia in the setting of decrease oral intake while using long term insulin. -following rec's by diabetes coordinator will discontinue long acting insulin for now -continue SSI and follow CBG's fluctuation now that his diet is advancing.  Physical deconditioning -Patient has been seen by physical therapy with recommendations for patient to go to short-term rehab at the skilled nursing facility at time of  discharge. -Continue acute care.  Acute metabolic encephalopathy- (present on admission) -Resolved -In the setting of metabolic encephalopathy and hypercapnia at time of admission. -Continue to wean oxygen supplementation of and continue providing constant reorientation.  AKI (acute kidney injury) ([place])- (present on admission) -Resolved -In the setting of hypotension and decreased perfusion -Currently stabilized and within normal limits -Continue to follow renal function stability especially in the setting of acute diuresis.  Elevated LFTs- (present on admission) -In the setting of hepatic congestion and hypoperfusion with initial shock presentation. -LFTs normalizing -HIDA scan negative  Sepsis ([place])- (present on admission) - Sepsis has been ruled out -Patient presented with SIRS /cardiogenic shock at time of admission -No source of infection found -SIRS features has resolved; patient without antibiotics for the last 48 hours.  PAF (paroxysmal atrial fibrillation) ([place])- (present on admission) -Rate well controlled -Continue Eliquis for secondary prevention -Continue to follow cardiology service for further recommendations. -Continue the use of digoxin.  COPD (chronic obstructive pulmonary disease) ([place])- (present on admission) -Stable overall; no wheezing appreciated on exam. -Continue as needed bronchodilators and home nebulizer management.  GERD (gastroesophageal reflux disease)- (present on admission) - Continue PPI  Essential hypertension- (present on admission) - Initially presented with cardiogenic shock and antihypertensive agents were held. -Transient use of peripheral pressors was required. -Currently stable and tolerating diuresis. -Continue to follow vital signs.    Subjective: Patient reports breathing is slightly better, no nausea, no vomiting, no chest pain.  Still having difficulty expressing himself in full sentences, feeling weak/deconditioned and  complaining of mild orthopnea.  Physical Exam: Vitals:   08/29/21 0900 08/29/21 1000 08/29/21 1124 08/29/21 1309  BP: 123/75 (!) 134/98    Pulse: 91 (!) 103 87   Resp: (!) 23 19 (!)  30   Temp:   98.1 F (36.7 C)   TempSrc:   Oral   SpO2: 100% 94% 99% 98%  Weight:      Height:       General exam: Alert, awake, oriented x 3, currently using 2 L nasal cannula supplementation to maintain O2 sats; denies chest pain, no fever, no nausea or vomiting.  Expressing mild difficulty with urinary initiation.   Respiratory system: Fine crackles at the bases, no using accessory muscles, positive rhonchi.  Good saturation on 2 L supplementation. Cardiovascular system:RRR. No murmurs, rubs, gallops.  No JVD; 2+ edema appreciated bilaterally. Gastrointestinal system: Abdomen is nondistended, soft and nontender. No organomegaly or masses felt. Normal bowel sounds heard. Central nervous system: Alert and oriented. No focal neurological deficits. Extremities: No cyanosis or clubbing. Skin: No petechiae. Psychiatry: Judgement and insight appear normal. Mood & affect appropriate.    Data Reviewed: Chest x-ray has demonstrated vascular congestion; patient 2D echo ejection fraction 20 to 25% with global hypokinesis and elevated left atrial pressure.  No significant valvular disorder appreciated.  Potassium 3.2 stable renal function normal sodium level and normal magnesium.  Family Communication: Daughter at bedside.  Disposition: Status is: Inpatient  Remains inpatient appropriate because: Still requiring IV diuresis to stabilize exacerbation of heart failure condition.  Planned Discharge Destination: Skilled nursing facility   Total critical care time: 55 minutes  Critical care time was exclusive of separately billable procedures and treating other patients.  Critical care was necessary to treat or prevent imminent or life-threatening deterioration.  Critical care was time spent personally by  me on the following activities: development of treatment plan with patient and/or surrogate as well as nursing, discussions with consultants, evaluation of patient's response to treatment, examination of patient, obtaining history from patient or surrogate, ordering and performing treatments and interventions, ordering and review of laboratory studies, ordering and review of radiographic studies, pulse oximetry and re-evaluation of patient's condition.    Author: Barton Dubois, MD 08/29/2021 2:01 PM  For on call review [email or web address].

## 2021-08-29 NOTE — Assessment & Plan Note (Addendum)
-  No using accessory muscles; positive rhonchi and expiratory wheezing appreciated. -At this point patient unable to properly use his home inhalers and declining the use of nebulizer management. -Will continue to offer as needed medication and starting to use the use of dilaudid for air hunger and SOB.

## 2021-08-29 NOTE — Assessment & Plan Note (Addendum)
-  Resolved -At this moment no further blood draws will be done -Focusing on comfort care and symptomatic management.

## 2021-08-29 NOTE — Assessment & Plan Note (Addendum)
-  Rate well controlled -Stopping Eliquis and digoxin -Plan is to transition to full comfort and symptomatic management only.

## 2021-08-29 NOTE — Assessment & Plan Note (Addendum)
-   Sliding scale insulin and CBGs monitoring will be discontinued. -Plan is to focus on comfort care and symptomatic management.

## 2021-08-29 NOTE — Assessment & Plan Note (Addendum)
-  Intermittently experiencing sundowning; most likely having some component of hypercapnic buildup episode. -Patient has continue refusing the use of CPAP/BiPAP.  At this moment even refusing nebulizer management. -Long discussion with family has triggered decision to pursued symptomatic management and comfort care only. -Low-dose Seroquel will be continue to assist with encephalopathy.  Agitation and restlessness will be tackle with the use of low-dose Haldol.

## 2021-08-29 NOTE — Assessment & Plan Note (Addendum)
-   Initially presented with cardiogenic shock and antihypertensive agents were held. -Transient use of peripheral pressors was required. -Blood pressure overall stable. -Transitioning to full comfort care and symptomatic management only.

## 2021-08-29 NOTE — Assessment & Plan Note (Addendum)
-  Ejection fraction 20-25% -After long discussion with family members decision has been made to pursue comfort care and symptomatic management only. -All medications not intended to provide comfort has been discontinued.

## 2021-08-29 NOTE — Assessment & Plan Note (Addendum)
-  In the setting of hepatic congestion and hypoperfusion with initial shock presentation. -LFTs normalizing -HIDA scan negative -Transitioning to full comfort care and symptomatic management only; will not pursuit any further blood draws.

## 2021-08-29 NOTE — Progress Notes (Signed)
Physical Therapy Treatment Patient Details Name: Perry Hunter MRN: [id number] DOB: 12/22/1950 Today's Date: 08/29/2021   History of Present Illness Perry Hunter is a 71 y.o. male presents with increasing confusion, shortness of breath, fever. Pt admitted 1/27 with sepsis. PMH: diabetes, CHF, hypertension, COPD, paroxysmal atrial fibrillation on anticoagulation, BPH, obstructive sleep apnea not on CPAP.    PT Comments    Pt with min-mod A for bed mobility and transfer  training.  Pt slow labored movements.  Cueing for hand assistance with bed rails to assist with supine to sitting and handplacement on bed to assist with standing.  Pt able to ambulate safely to chair with use of RW, slow labored cadence.  EOS pt left in chair with call bell within reach and daughter/granddaughter present in room.  RN aware of status.    Recommendations for follow up therapy are one component of a multi-disciplinary discharge planning process, led by the attending physician.  Recommendations may be updated based on patient status, additional functional criteria and insurance authorization.  Follow Up Recommendations  Skilled nursing-short term rehab (<3 hours/day)     Assistance Recommended at Discharge Frequent or constant Supervision/Assistance  Patient can return home with the following A lot of help with walking and/or transfers;A lot of help with bathing/dressing/bathroom;Assistance with cooking/housework;Assist for transportation;Help with stairs or ramp for entrance   Equipment Recommendations  Rolling walker (2 wheels)    Recommendations for Other Services       Precautions / Restrictions Precautions Precautions: Fall Restrictions Weight Bearing Restrictions: No     Mobility  Bed Mobility Overal bed mobility: Modified Independent Bed Mobility: Supine to Sit     Supine to sit: Mod assist, HOB elevated     General bed mobility comments: increased time, VC for sequencing, inches BLE over  to EOB, pt pulling on therapist's hand to upright trunk into sitting    Transfers Overall transfer level: Needs assistance Equipment used: Rolling walker (2 wheels)   Sit to Stand: Min assist           General transfer comment: cueing for hand placement for safe STS, min-mod A to steady from rising from EOB, increased time    Ambulation/Gait Ambulation/Gait assistance: Min assist Gait Distance (Feet): 10 Feet Assistive device: Rolling walker (2 wheels) Gait Pattern/deviations: Decreased step length - right, Decreased step length - left, Decreased stride length Gait velocity: decreased     General Gait Details: unsteady, use of RW for safety   Stairs             Wheelchair Mobility    Modified Rankin (Stroke Patients Only)       Balance                                            Cognition Arousal/Alertness: Awake/alert Behavior During Therapy: WFL for tasks assessed/performed Overall Cognitive Status: Within Functional Limits for tasks assessed                                          Exercises General Exercises - Lower Extremity Ankle Circles/Pumps: AROM, Strengthening, Both, 10 reps Long Arc Quad: AROM, Strengthening, Both, 10 reps Hip Flexion/Marching: Both, 10 reps, Seated Toe Raises: 10 reps, Seated, Strengthening Heel Raises: 10 reps, Seated, Strengthening  General Comments        Pertinent Vitals/Pain Pain Assessment Pain Assessment: No/denies pain    Home Living                          Prior Function            PT Goals (current goals can now be found in the care plan section)      Frequency    Min 3X/week      PT Plan Discharge plan needs to be updated    Co-evaluation              AM-PAC PT "6 Clicks" Mobility   Outcome Measure  Help needed turning from your back to your side while in a flat bed without using bedrails?: A Little Help needed moving from lying on  your back to sitting on the side of a flat bed without using bedrails?: A Lot Help needed moving to and from a bed to a chair (including a wheelchair)?: A Little Help needed standing up from a chair using your arms (e.g., wheelchair or bedside chair)?: A Little Help needed to walk in hospital room?: Total Help needed climbing 3-5 steps with a railing? : Total 6 Click Score: 13    End of Session Equipment Utilized During Treatment: Gait belt Activity Tolerance: Patient tolerated treatment well;Patient limited by fatigue Patient left: in chair;with call bell/phone within reach;with family/visitor present (Daughter and granddaughter present in room) Nurse Communication: Mobility status PT Visit Diagnosis: Unsteadiness on feet (R26.81);Other abnormalities of gait and mobility (R26.89);Muscle weakness (generalized) (M62.81)     Time: 1030-1055 PT Time Calculation (min) (ACUTE ONLY): 25 min  Charges:                        Ihor Austin, LPTA/CLT; CBIS [phone]  Aldona Lento 08/29/2021, 11:20 AM

## 2021-08-29 NOTE — Assessment & Plan Note (Addendum)
Continue PPI ?

## 2021-08-29 NOTE — Assessment & Plan Note (Addendum)
-  Patient has been seen by physical therapy with recommendations for patient to go to short-term rehab at [facility]. -Condition has further deteriorates and at this moment there are no appears stable to pursue rehabilitation.  After discussion with family decision has been made to transition to comfort care and symptomatic management only. -TOC has been consulted for residential hospice placement.

## 2021-08-30 DIAGNOSIS — J9601 Acute respiratory failure with hypoxia: Secondary | ICD-10-CM

## 2021-08-30 DIAGNOSIS — E118 Type 2 diabetes mellitus with unspecified complications: Secondary | ICD-10-CM

## 2021-08-30 LAB — GLUCOSE, CAPILLARY
Glucose-Capillary: 103 mg/dL — ABNORMAL HIGH (ref 70–99)
Glucose-Capillary: 130 mg/dL — ABNORMAL HIGH (ref 70–99)
Glucose-Capillary: 161 mg/dL — ABNORMAL HIGH (ref 70–99)
Glucose-Capillary: 72 mg/dL (ref 70–99)
Glucose-Capillary: 86 mg/dL (ref 70–99)
Glucose-Capillary: 90 mg/dL (ref 70–99)
Glucose-Capillary: 97 mg/dL (ref 70–99)

## 2021-08-30 LAB — BLOOD GAS, ARTERIAL
Acid-Base Excess: 8.5 mmol/L — ABNORMAL HIGH (ref 0.0–2.0)
Bicarbonate: 31.9 mmol/L — ABNORMAL HIGH (ref 20.0–28.0)
Drawn by: 27733
FIO2: 28
O2 Saturation: 97.3 %
Patient temperature: 36.6
pCO2 arterial: 51.3 mmHg — ABNORMAL HIGH (ref 32.0–48.0)
pH, Arterial: 7.424 (ref 7.350–7.450)
pO2, Arterial: 89.2 mmHg (ref 83.0–108.0)

## 2021-08-30 LAB — MAGNESIUM: Magnesium: 2 mg/dL (ref 1.7–2.4)

## 2021-08-30 NOTE — Assessment & Plan Note (Addendum)
-  Continue modified carbohydrate diet -Continue sliding scale insulin. -considering starting SGLT-2 inhibitor; will follow CBG's

## 2021-08-30 NOTE — Plan of Care (Signed)
°  Problem: Education: Goal: Knowledge of General Education information will improve Description: Including pain rating scale, medication(s)/side effects and non-pharmacologic comfort measures Outcome: Not Progressing   Problem: Health Behavior/Discharge Planning: Goal: Ability to manage health-related needs will improve Outcome: Not Progressing   Problem: Clinical Measurements: Goal: Ability to maintain clinical measurements within normal limits will improve Outcome: Progressing Goal: Will remain free from infection Outcome: Progressing Goal: Diagnostic test results will improve Outcome: Progressing Goal: Respiratory complications will improve Outcome: Progressing Goal: Cardiovascular complication will be avoided Outcome: Progressing   Problem: Activity: Goal: Risk for activity intolerance will decrease Outcome: Not Progressing   Problem: Nutrition: Goal: Adequate nutrition will be maintained Outcome: Progressing   Problem: Coping: Goal: Level of anxiety will decrease Outcome: Progressing   Problem: Elimination: Goal: Will not experience complications related to bowel motility Outcome: Progressing Goal: Will not experience complications related to urinary retention Outcome: Progressing   Problem: Pain Managment: Goal: General experience of comfort will improve Outcome: Progressing   Problem: Safety: Goal: Ability to remain free from injury will improve Outcome: Progressing   Problem: Skin Integrity: Goal: Risk for impaired skin integrity will decrease Outcome: Not Progressing

## 2021-08-30 NOTE — Plan of Care (Signed)

## 2021-08-30 NOTE — Progress Notes (Signed)
Patient placed in bilateral safety mitts, after 2 successful IV removals, 2 successful telemetry lead displacement, and 2 removals of malewicks. Patient rendered incontinence care and positioned for comfort.

## 2021-08-30 NOTE — Progress Notes (Signed)
Patient already off BIPAP. Unit still at bedside.

## 2021-08-30 NOTE — Progress Notes (Addendum)
**Note De-identified  Obfuscation** ABG collected and delivered to lab ?

## 2021-08-30 NOTE — Progress Notes (Signed)
Progress Note  Patient Name: Perry Hunter Date of Encounter: 08/30/2021  [facility] Cardiologist: None   Subjective   Yesterday was more alert, more himself, this morning mittens in place, confusion, was altered.  Appears sleepy.  Inpatient Medications    Scheduled Meds:  Chlorhexidine Gluconate Cloth  6 each Topical Q0600   digoxin  0.0625 mg Oral Daily   enoxaparin (LOVENOX) injection  100 mg Subcutaneous Q12H   finasteride  5 mg Oral Daily   furosemide  40 mg Intravenous Q12H   insulin aspart  0-9 Units Subcutaneous Q4H   midodrine  15 mg Oral TID WC   pantoprazole (PROTONIX) IV  40 mg Intravenous Q12H   potassium chloride  20 mEq Oral Daily   sucralfate  1 g Oral QID   tamsulosin  0.4 mg Oral QPC supper   traZODone  50 mg Oral QHS   umeclidinium bromide  1 puff Inhalation Daily   Continuous Infusions:  sodium chloride 10 mL/hr at 08/29/21 0438   PRN Meds: albuterol, HYDROcodone-acetaminophen, LORazepam, ondansetron **OR** ondansetron (ZOFRAN) IV   Vital Signs    Vitals:   08/29/21 2121 08/29/21 2257 08/29/21 2300 08/30/21 0421  BP: 114/62   105/60  Pulse: (!) 54 (!) 109  (!) 110  Resp: 16 19    Temp: 98.4 F (36.9 C)   97.8 F (36.6 C)  TempSrc: Oral   Oral  SpO2: 97% 99%  94%  Weight:   106.1 kg   Height:   5\' 8"  (1.727 m)     Intake/Output Summary (Last 24 hours) at 08/30/2021 0160 Last data filed at 08/30/2021 0100 Gross per 24 hour  Intake 0 ml  Output 2600 ml  Net -2600 ml   Last 3 Weights 08/29/2021 08/29/2021 08/28/2021  Weight (lbs) 233 lb 14.5 oz 236 lb 12.4 oz 247 lb 2.2 oz  Weight (kg) 106.1 kg 107.4 kg 112.1 kg      Telemetry    Atrial fibrillation under reasonable rate control- Personally Reviewed  ECG    No new, prior atrial fibrillation- Personally Reviewed  Physical Exam   GEN: Ill-appearing, laying in bed confused.   Neck: No JVD Cardiac: IRR, no murmurs, rubs, or gallops.  Respiratory: Clear to auscultation  bilaterally. GI: Soft, nontender, non-distended  MS: 2+ lower extremity edema; No deformity. Neuro:  Nonfocal, moving all extremities Psych: Confusion  Labs    High Sensitivity Troponin:  No results for input(s): TROPONINIHS in the last 720 hours.   Chemistry Recent Labs  Lab 08/27/21 0243 08/28/21 0354 08/29/21 0347 08/30/21 0521  NA 133* 133* 135  --   K 4.6 4.1 3.2*  --   CL 100 100 99  --   CO2 26 29 30   --   GLUCOSE 85 47* 86  --   BUN 38* 31* 25*  --   CREATININE 1.51* 1.06 1.09  --   CALCIUM 7.8* 7.5* 7.8*  --   MG 1.9 2.1 1.8 2.0  PROT 6.5 6.4* 6.4*  --   ALBUMIN 2.7* 2.6* 2.7*  --   AST 413* 273* 167*  --   ALT 207* 173* 132*  --   ALKPHOS 103 102 100  --   BILITOT 1.6* 1.4* 1.6*  --   GFRNONAA 49* >60 >60  --   ANIONGAP 7 4* 6  --     Lipids No results for input(s): CHOL, TRIG, HDL, LABVLDL, LDLCALC, CHOLHDL in the last 168 hours.  Hematology Recent Labs  Lab 08/27/21 0243 08/28/21 0354 08/29/21 0347  WBC 9.1 8.7 9.2  RBC 5.00 5.01 4.86  HGB 8.3* 7.8* 7.7*  HCT 30.1* 29.8* 28.7*  MCV 60.2* 59.5* 59.1*  MCH 16.6* 15.6* 15.8*  MCHC 27.6* 26.2* 26.8*  RDW 23.6* 23.6* 23.4*  PLT 290 360 397   Thyroid  Recent Labs  Lab 08/24/21 1610  TSH 1.380    BNP Recent Labs  Lab 08/28/21 0354  BNP 1,346.0*    DDimer No results for input(s): DDIMER in the last 168 hours.   Radiology    ECHOCARDIOGRAM COMPLETE  Result Date: 08/28/2021    ECHOCARDIOGRAM REPORT   Patient Name:   Perry Hunter Date of Exam: 08/28/2021 Medical Rec #:  [id number]     Height:       68.0 in Accession #:    [id number]    Weight:       247.1 lb Date of Birth:  January 04, 1951     BSA:          2.236 m Patient Age:    71 years      BP:           140/92 mmHg Patient Gender: M             HR:           90 bpm. Exam Location:  [facility] Procedure: 2D Echo, Cardiac Doppler, Color Doppler and Intracardiac            Opacification Agent Indications:    CHF  History:        Patient has prior  history of Echocardiogram examinations, most                 recent 08/26/2020. CHF, COPD, Arrythmias:Atrial Fibrillation;                 Risk Factors:Hypertension, Diabetes, Dyslipidemia and Former                 Smoker.  Sonographer:    Wenda Low Referring Phys: [id number] [facility]  Sonographer Comments: Patient is morbidly obese. Image acquisition challenging due to COPD. IMPRESSIONS  1. Left ventricular ejection fraction, by estimation, is 20 to 25%. The left ventricle has severely decreased function. The left ventricle demonstrates global hypokinesis. The left ventricular internal cavity size was moderately dilated. Left ventricular diastolic parameters are indeterminate. Elevated left atrial pressure.  2. Right ventricular systolic function is mildly reduced. The right ventricular size is moderately enlarged. There is moderately elevated pulmonary artery systolic pressure.  3. Left atrial size was severely dilated.  4. Right atrial size was severely dilated.  5. Moderate pericardial effusion. The pericardial effusion is circumferential. There is no evidence of cardiac tamponade.  6. The mitral valve is normal in structure. Trivial mitral valve regurgitation. No evidence of mitral stenosis.  7. The aortic valve is tricuspid. There is moderate calcification of the aortic valve. There is moderate thickening of the aortic valve. Aortic valve regurgitation is mild.  8. Aortic dilatation noted. There is mild dilatation of the ascending aorta, measuring 37 mm.  9. The inferior vena cava is dilated in size with >50% respiratory variability, suggesting right atrial pressure of 8 mmHg. FINDINGS  Left Ventricle: Left ventricular ejection fraction, by estimation, is 20 to 25%. The left ventricle has severely decreased function. The left ventricle demonstrates global hypokinesis. Definity contrast agent was given IV to delineate the left ventricular endocardial borders. The left ventricular internal cavity size  was moderately dilated. There is no left ventricular hypertrophy. Left ventricular diastolic parameters are indeterminate. Elevated left atrial pressure. Right Ventricle: The right ventricular size is moderately enlarged. Right vetricular wall thickness was not well visualized. Right ventricular systolic function is mildly reduced. There is moderately elevated pulmonary artery systolic pressure. The tricuspid regurgitant velocity is 2.91 m/s, and with an assumed right atrial pressure of 15 mmHg, the estimated right ventricular systolic pressure is 38.7 mmHg. Left Atrium: Left atrial size was severely dilated. Right Atrium: Right atrial size was severely dilated. Pericardium: A moderately sized pericardial effusion is present. The pericardial effusion is circumferential. There is no evidence of cardiac tamponade. Mitral Valve: The mitral valve is normal in structure. There is mild thickening of the mitral valve leaflet(s). There is mild calcification of the mitral valve leaflet(s). Mild mitral annular calcification. Trivial mitral valve regurgitation. No evidence  of mitral valve stenosis. MV peak gradient, 9.7 mmHg. The mean mitral valve gradient is 3.0 mmHg. Tricuspid Valve: The tricuspid valve is not well visualized. Tricuspid valve regurgitation is mild . No evidence of tricuspid stenosis. Aortic Valve: The aortic valve is tricuspid. There is moderate calcification of the aortic valve. There is moderate thickening of the aortic valve. Aortic valve regurgitation is mild. Aortic valve mean gradient measures 5.5 mmHg. Aortic valve peak gradient measures 8.6 mmHg. Aortic valve area, by VTI measures 1.62 cm. Pulmonic Valve: The pulmonic valve was not well visualized. Pulmonic valve regurgitation is not visualized. No evidence of pulmonic stenosis. Aorta: The aortic root is normal in size and structure and aortic dilatation noted. There is mild dilatation of the ascending aorta, measuring 37 mm. Venous: The inferior  vena cava is dilated in size with greater than 50% respiratory variability, suggesting right atrial pressure of 8 mmHg. IAS/Shunts: No atrial level shunt detected by color flow Doppler.  LEFT VENTRICLE PLAX 2D LVIDd:         6.50 cm   Diastology LVIDs:         5.40 cm   LV e' lateral:   7.83 cm/s LV PW:         1.10 cm   LV E/e' lateral: 16.2 LV IVS:        0.90 cm LVOT diam:     2.10 cm LV SV:         46 LV SV Index:   21 LVOT Area:     3.46 cm  RIGHT VENTRICLE RV Basal diam:  3.55 cm RV Mid diam:    3.30 cm RV S prime:     11.40 cm/s TAPSE (M-mode): 1.9 cm LEFT ATRIUM              Index        RIGHT ATRIUM           Index LA diam:        6.00 cm  2.68 cm/m   RA Area:     30.60 cm LA Vol (A2C):   117.0 ml 52.32 ml/m  RA Volume:   109.00 ml 48.75 ml/m LA Vol (A4C):   111.0 ml 49.64 ml/m LA Biplane Vol: 118.0 ml 52.77 ml/m  AORTIC VALVE                     PULMONIC VALVE AV Area (Vmax):    1.71 cm      PV Vmax:       0.67 m/s AV Area (Vmean):   1.58 cm  PV Peak grad:  1.8 mmHg AV Area (VTI):     1.62 cm AV Vmax:           147.00 cm/s AV Vmean:          106.000 cm/s AV VTI:            0.285 m AV Peak Grad:      8.6 mmHg AV Mean Grad:      5.5 mmHg LVOT Vmax:         72.40 cm/s LVOT Vmean:        48.450 cm/s LVOT VTI:          0.133 m LVOT/AV VTI ratio: 0.47  AORTA Ao Root diam: 3.60 cm Ao Asc diam:  3.70 cm MITRAL VALVE                TRICUSPID VALVE MV Area (PHT): 3.76 cm     TR Peak grad:   33.9 mmHg MV Peak grad:  9.7 mmHg     TR Vmax:        291.00 cm/s MV Mean grad:  3.0 mmHg MV Vmax:       1.56 m/s     SHUNTS MV Vmean:      66.8 cm/s    Systemic VTI:  0.13 m MV Decel Time: 202 msec     Systemic Diam: 2.10 cm MV E velocity: 127.00 cm/s Carlyle Dolly MD Electronically signed by Carlyle Dolly MD Signature Date/Time: 08/28/2021/1:15:22 PM    Final     Cardiac Studies   Echo EF 20 to 25%  Patient Profile     71 y.o. male here with acute on chronic systolic heart failure, coronary artery  disease, persistent atrial fibrillation, shock, severe anemia, acute kidney injury, chronic hypotension  Assessment & Plan    Acute on chronic systolic heart failure - Echo EF 20% severe RV dysfunction as well combination RV as well as LV failure. - Occluded circumflex proximal RCA 99% mid LAD 40% - Combination ischemic cardiomyopathy as well as enhancement from atrial fibrillation tachycardia and alcohol - Has had difficulties with hypotension precluding goal-directed medical therapy. - Hydrated originally with 4 L thought to be septic shock..  Chest x-ray demonstrated pulmonary edema.  Co. ox 62 on 2/1.  With significantly reduced ejection fraction this is quite a reasonable number and may point towards initial suspicion of potentially septic component. -IV Lasix was started 2 days ago and continued.  Yesterday out 2.6 L.  Creatinine stable at 1.09, labs this morning pending. -I am comfortable continuing with IV Lasix at this point with midodrine use as well as digoxin to improve contractility and blood pressure.  Norepinephrine is off.  Shock - Originally lactic acid 7.3 with procalcitonin peak of 0.32.  Had multiorgan failure with AKI transaminitis acute mental status changes.  Co. ox as stated above in the 62 to 69% range which is high for someone with ejection fraction of 20%.  Agree with initial consideration of septic shock.  No clear source at this point.  Off of norepinephrine continue with midodrine.  Blood pressures continue to improve.  Encephalopathy - More confusion today.  Mittens in place.  Concerning.  Question if antibiotics are needed.  Per primary team.  Acute kidney injury - Admitted with creatinine 1.7 baseline is around 1.2 currently 1.09 yesterday.  Excellent.  Secondary to shock and hypotension.  Chronic hypotension - Likely result of his cardiomyopathy.  His regimen includes midodrine at home.  This makes  goal-directed medical therapy for his heart failure  challenging.  Hypercapnic respiratory failure - BiPAP originally.  DuoNebs.  Persistent atrial fibrillation - On just oral digoxin for rate control.  Seems to be doing a reasonable job.  Chronic anticoagulation - Convert to Eliquis when comfortable.  Now on Lovenox.  Severe anemia - Hemoglobin 7.7.  No obvious source of bleeding.  Continue to monitor.  Last year at this time hemoglobin was 9.0.  Fairly chronic likely secondary to chronic illness.    For questions or updates, please contact [place] Please consult [email or web address] for contact info under        Signed, Candee Furbish, MD  08/30/2021, 8:33 AM

## 2021-08-30 NOTE — Progress Notes (Signed)
Patient has refused Bipap for tonight.  Stated that MD told him that he did not have to wear it.

## 2021-08-30 NOTE — Progress Notes (Signed)
Progress Note   Patient: Perry Hunter MWN:[id number] DOB: Dec 16, 1950 DOA: 08/24/2021     6 DOS: the patient was seen and examined on 08/30/2021   Brief hospital course: Jacksen Isip is a 71 y.o. male with a history of diabetes, CHF, hypertension, COPD, paroxysmal atrial fibrillation on anticoagulation, BPH, obstructive sleep apnea not on CPAP.  Patient brought to the hospital for increasing confusion, shortness of breath, fever.  Patient was admitted with initial concerns for septic shock with unknown source with associated metabolic encephalopathy and AKI.  However, it appears that he may not have initially been septic and simply presented with shock-possibly cardiogenic.  No source of infection has been found and therefore antibiotics have been discontinued 1/31.  He was thought to have possible acute cholecystitis during the course of this hospitalization as well, however HIDA scan has come back negative.  He now appears quite volume overloaded and likely has acute on chronic systolic CHF exacerbation and has been started on IV Lasix with cardiology following.  Assessment and Plan: * Acute on chronic systolic CHF (congestive heart failure) (HCC) -Ejection fraction 20-25% -Follow cardiology service recommendation -Continue to follow daily weights and strict I's and O's -Diet has been advanced to heart healthy/low-sodium. -Continue IV diuresis and follow clinical response.  Type 2 diabetes mellitus with complication, with long-term current use of insulin (HCC) -currently with hypoglycemia in the setting of decrease oral intake while using long term insulin. -following rec's by diabetes coordinator will discontinue long acting insulin for now -continue SSI and follow CBG's fluctuation now that his diet is advancing.  Physical deconditioning -Patient has been seen by physical therapy with recommendations for patient to go to short-term rehab at the skilled nursing facility at time of  discharge. -Continue acute care.  Acute metabolic encephalopathy- (present on admission) -Resolved -In the setting of metabolic encephalopathy and hypercapnia at time of admission. -Continue to wean oxygen supplementation of and continue providing constant reorientation.  AKI (acute kidney injury) ([place])- (present on admission) -Resolved -In the setting of hypotension and decreased perfusion -Currently stabilized and within normal limits -Continue to follow renal function stability especially in the setting of acute diuresis.  Elevated LFTs- (present on admission) -In the setting of hepatic congestion and hypoperfusion with initial shock presentation. -LFTs normalizing -HIDA scan negative  Sepsis ([place])- (present on admission) - Sepsis has been ruled out -Patient presented with SIRS /cardiogenic shock at time of admission -No source of infection found -SIRS features has resolved; patient without antibiotics for the last 72 hours.  PAF (paroxysmal atrial fibrillation) ([place])- (present on admission) -Rate well controlled -Continue Eliquis for secondary prevention -Continue to follow cardiology service for further recommendations. -Continue the use of digoxin.  Diabetes mellitus ([place]) - Continue modified carbohydrate diet -Continue sliding scale insulin.  COPD (chronic obstructive pulmonary disease) ([place])- (present on admission) -Stable overall; no wheezing appreciated on exam. -Continue as needed bronchodilators and home nebulizer management. -Continue to follow respiratory status and wean off oxygen supplementation as tolerated.  Goal is for O2 sat above 90%.  GERD (gastroesophageal reflux disease)- (present on admission) - Continue PPI  Essential hypertension- (present on admission) - Initially presented with cardiogenic shock and antihypertensive agents were held. -Transient use of peripheral pressors was required. -Currently stable and tolerating diuresis. -Continue to  follow vital signs.    Subjective:  Patient reports no chest pain, no nausea, no vomiting.  Still short winded and having complaints of mild orthopnea.  Denies nausea, vomiting, dysuria or any other complaints.  Improved urine output after Flomax initiation.  Physical Exam: Vitals:   08/30/21 0421 08/30/21 1025 08/30/21 1038 08/30/21 1236  BP: 105/60  130/83 120/74  Pulse: (!) 110  (!) 106 94  Resp:   18 20  Temp: 97.8 F (36.6 C)  97.9 F (36.6 C) 98.4 F (36.9 C)  TempSrc: Oral  Oral Oral  SpO2: 94% 96% 97% 98%  Weight:      Height:      General exam: Alert, awake, oriented x 2, earlier experiencing confusion and disorientation.  Nursing reporting overnight removing IVs, telemetry and demonstrating difficulties following commands. Respiratory system: Short winded with minimal activity, fine crackles at the bases.  Positive scattered rhonchi.  Good saturation on 2 L supplementation. Cardiovascular system: Irregular.  No rubs, no gallops, no JVD on exam.   Gastrointestinal system: Abdomen is nondistended, soft and nontender. No organomegaly or masses felt. Normal bowel sounds heard. Central nervous system: Alert and oriented. No focal neurological deficits. Extremities: No cyanosis or clubbing.  2+ edema appreciated on his extremities bilaterally. Skin: No petechiae. Psychiatry: Mood & affect appears to be stable currently.  Data Reviewed: Chest x-ray has demonstrated vascular congestion; patient 2D echo ejection fraction 20 to 25% with global hypokinesis and elevated left atrial pressure.  No significant valvular disorder appreciated.  ABG demonstrating a pH of 7.42, CO2 51.3 PO2 89.2 HCO3 31.9  Magnesium within normal limits.  Telemetry demonstrating adequate rate controlled atrial fibrillation.  Family Communication: Daughter at bedside.  Disposition: Status is: Inpatient  Remains inpatient appropriate because: Still requiring IV diuresis to stabilize exacerbation of  heart failure condition.  Planned Discharge Destination: Skilled nursing facility once medically stable.   Author: Barton Dubois, MD 08/30/2021 6:06 PM  For on call review [email or web address].

## 2021-08-31 LAB — BASIC METABOLIC PANEL
Anion gap: 8 (ref 5–15)
BUN: 21 mg/dL (ref 8–23)
CO2: 32 mmol/L (ref 22–32)
Calcium: 8 mg/dL — ABNORMAL LOW (ref 8.9–10.3)
Chloride: 98 mmol/L (ref 98–111)
Creatinine, Ser: 1.15 mg/dL (ref 0.61–1.24)
GFR, Estimated: >60 mL/min (ref >60)
Glucose, Bld: 108 mg/dL — ABNORMAL HIGH (ref 70–99)
Potassium: 3.2 mmol/L — ABNORMAL LOW (ref 3.5–5.1)
Sodium: 138 mmol/L (ref 135–145)

## 2021-08-31 LAB — GLUCOSE, CAPILLARY
Glucose-Capillary: 119 mg/dL — ABNORMAL HIGH (ref 70–99)
Glucose-Capillary: 111 mg/dL — ABNORMAL HIGH (ref 70–99)
Glucose-Capillary: 146 mg/dL — ABNORMAL HIGH (ref 70–99)
Glucose-Capillary: 175 mg/dL — ABNORMAL HIGH (ref 70–99)
Glucose-Capillary: 95 mg/dL (ref 70–99)

## 2021-08-31 MED ORDER — PANTOPRAZOLE SODIUM 40 MG PO TBEC
40.0000 mg | DELAYED_RELEASE_TABLET | Freq: Two times a day (BID) | ORAL | Status: DC
  Administered 2021-08-31 – 2021-09-04 (×7): 40 mg via ORAL
  Filled 2021-08-31 (×8): qty 1

## 2021-08-31 MED ORDER — POLYETHYLENE GLYCOL 3350 17 G PO PACK
17.0000 g | PACK | Freq: Every day | ORAL | Status: DC
  Administered 2021-08-31 – 2021-09-04 (×3): 17 g via ORAL
  Filled 2021-08-31 (×4): qty 1

## 2021-08-31 MED ORDER — APIXABAN 5 MG PO TABS
5.0000 mg | ORAL_TABLET | Freq: Two times a day (BID) | ORAL | Status: DC
  Administered 2021-08-31 – 2021-09-02 (×3): 5 mg via ORAL
  Filled 2021-08-31 (×4): qty 1

## 2021-08-31 MED ORDER — DOCUSATE SODIUM 100 MG PO CAPS
100.0000 mg | ORAL_CAPSULE | Freq: Two times a day (BID) | ORAL | Status: DC
  Administered 2021-08-31 – 2021-09-04 (×7): 100 mg via ORAL
  Filled 2021-08-31 (×8): qty 1

## 2021-08-31 MED ORDER — POTASSIUM CHLORIDE CRYS ER 20 MEQ PO TBCR
20.0000 meq | EXTENDED_RELEASE_TABLET | Freq: Two times a day (BID) | ORAL | Status: DC
  Administered 2021-08-31 – 2021-09-02 (×4): 20 meq via ORAL
  Filled 2021-08-31 (×5): qty 1

## 2021-08-31 NOTE — Progress Notes (Signed)
Applied bilateral safety mitts to patient for increased agitation and restlessness exhibited by pulling off telemetry leads and attempting to remove IV. PRN for agitation administered with little effectiveness noted.

## 2021-08-31 NOTE — Plan of Care (Signed)

## 2021-08-31 NOTE — TOC Progression Note (Addendum)
Transition of Care [facility]) - Progression Note    Patient Details  Name: Rolly Magri MRN: [id number] Date of Birth: [birth year]  [facility] CM/SW Contact  Ihor Gully, LCSW Phone Number: 08/31/2021, 11:43 AM  Clinical Narrative:    Per Ebony Hail at [facility] they are in network with the insurance company and auth request should proceed.  They can accept patient after he is 48 hours mitts free. Attending notified.    Expected Discharge Plan: [place] Barriers to Discharge: Continued Medical Work up  Expected Discharge Plan and Services Expected Discharge Plan: [place] In-house Referral: Clinical Social Work   Post Acute Care Choice: [place] arrangements for the past 2 months: [place]: RN, PT [place] Agency: [place] ([name]) Date [place]: 08/27/21 Time [place]: What [place] Representative spoke with at [place]: [place] Determinants of Health ([place]) Interventions    Readmission Risk Interventions Readmission Risk Prevention Plan 08/27/2021  Transportation Screening Complete  Medication Review Press photographer) Complete  HRI or [place] Complete  SW Recovery Care/Counseling Consult Complete  [place] Not Applicable  Some recent data might be hidden

## 2021-08-31 NOTE — Progress Notes (Addendum)
Progress Note  Patient Name: Perry Hunter Date of Encounter: 08/31/2021  [facility] Cardiologist: Previously followed by Dr. Haroldine Laws but no outpatient follow-up since 09/2020  Subjective   Still with dyspnea at rest. No chest pain or palpitations. Planning to discharge to SNF over the weekend per discussion with the Hospitalist.   Inpatient Medications    Scheduled Meds:  Chlorhexidine Gluconate Cloth  6 each Topical Q0600   digoxin  0.0625 mg Oral Daily   enoxaparin (LOVENOX) injection  100 mg Subcutaneous Q12H   finasteride  5 mg Oral Daily   furosemide  40 mg Intravenous Q12H   insulin aspart  0-9 Units Subcutaneous Q4H   midodrine  15 mg Oral TID WC   pantoprazole (PROTONIX) IV  40 mg Intravenous Q12H   potassium chloride  20 mEq Oral BID   sucralfate  1 g Oral QID   tamsulosin  0.4 mg Oral QPC supper   traZODone  50 mg Oral QHS   umeclidinium bromide  1 puff Inhalation Daily   Continuous Infusions:  sodium chloride 10 mL/hr at 08/29/21 0438   PRN Meds: albuterol, HYDROcodone-acetaminophen, LORazepam, ondansetron **OR** ondansetron (ZOFRAN) IV   Vital Signs    Vitals:   08/30/21 1236 08/30/21 2037 08/31/21 0356 08/31/21 0830  BP: 120/74 105/64 107/71   Pulse: 94 (!) 102 98   Resp: 20 20    Temp: 98.4 F (36.9 C) 98 F (36.7 C) (!) 97.5 F (36.4 C)   TempSrc: Oral Oral Oral   SpO2: 98% 94% (!) 89% 96%  Weight:      Height:        Intake/Output Summary (Last 24 hours) at 08/31/2021 0959 Last data filed at 08/31/2021 0947 Gross per 24 hour  Intake 1116 ml  Output 1300 ml  Net -184 ml   Last 3 Weights 08/29/2021 08/29/2021 08/28/2021  Weight (lbs) 233 lb 14.5 oz 236 lb 12.4 oz 247 lb 2.2 oz  Weight (kg) 106.1 kg 107.4 kg 112.1 kg      Telemetry    Atrial fibrillation, HR in 80's to low-100's. 7 beats NSVT.  - Personally Reviewed  ECG    No new tracings.   Physical Exam   GEN: Elderly male appearing in no acute distress.   Neck: No  JVD Cardiac: Irregularly irregular, no murmurs, rubs, or gallops.  Respiratory: Expiratory wheezing along upper lung fields. On supplemental oxygen.  GI: Soft, nontender, non-distended  MS: 2+ pitting edema bilaterally; No deformity. Neuro:  Nonfocal  Psych: Normal affect   Labs    High Sensitivity Troponin:  No results for input(s): TROPONINIHS in the last 720 hours.   Chemistry Recent Labs  Lab 08/27/21 0243 08/28/21 0354 08/29/21 0347 08/30/21 0521 08/31/21 0502  NA 133* 133* 135  --  138  K 4.6 4.1 3.2*  --  3.2*  CL 100 100 99  --  98  CO2 26 29 30   --  32  GLUCOSE 85 47* 86  --  108*  BUN 38* 31* 25*  --  21  CREATININE 1.51* 1.06 1.09  --  1.15  CALCIUM 7.8* 7.5* 7.8*  --  8.0*  MG 1.9 2.1 1.8 2.0  --   PROT 6.5 6.4* 6.4*  --   --   ALBUMIN 2.7* 2.6* 2.7*  --   --   AST 413* 273* 167*  --   --   ALT 207* 173* 132*  --   --   ALKPHOS 103 102  100  --   --   BILITOT 1.6* 1.4* 1.6*  --   --   GFRNONAA 49* >60 >60  --  >60  ANIONGAP 7 4* 6  --  8    Lipids No results for input(s): CHOL, TRIG, HDL, LABVLDL, LDLCALC, CHOLHDL in the last 168 hours.  Hematology Recent Labs  Lab 08/27/21 0243 08/28/21 0354 08/29/21 0347  WBC 9.1 8.7 9.2  RBC 5.00 5.01 4.86  HGB 8.3* 7.8* 7.7*  HCT 30.1* 29.8* 28.7*  MCV 60.2* 59.5* 59.1*  MCH 16.6* 15.6* 15.8*  MCHC 27.6* 26.2* 26.8*  RDW 23.6* 23.6* 23.4*  PLT 290 360 397   Thyroid  Recent Labs  Lab 08/24/21 1610  TSH 1.380    BNP Recent Labs  Lab 08/28/21 0354  BNP 1,346.0*    DDimer No results for input(s): DDIMER in the last 168 hours.   Radiology    No results found.  Cardiac Studies   Echocardiogram: 08/28/2021 IMPRESSIONS     1. Left ventricular ejection fraction, by estimation, is 20 to 25%. The  left ventricle has severely decreased function. The left ventricle  demonstrates global hypokinesis. The left ventricular internal cavity size  was moderately dilated. Left  ventricular diastolic  parameters are indeterminate. Elevated left atrial  pressure.   2. Right ventricular systolic function is mildly reduced. The right  ventricular size is moderately enlarged. There is moderately elevated  pulmonary artery systolic pressure.   3. Left atrial size was severely dilated.   4. Right atrial size was severely dilated.   5. Moderate pericardial effusion. The pericardial effusion is  circumferential. There is no evidence of cardiac tamponade.   6. The mitral valve is normal in structure. Trivial mitral valve  regurgitation. No evidence of mitral stenosis.   7. The aortic valve is tricuspid. There is moderate calcification of the  aortic valve. There is moderate thickening of the aortic valve. Aortic  valve regurgitation is mild.   8. Aortic dilatation noted. There is mild dilatation of the ascending  aorta, measuring 37 mm.   9. The inferior vena cava is dilated in size with >50% respiratory  variability, suggesting right atrial pressure of 8 mmHg.   Patient Profile     71 y.o. male w/ PMH of HFrEF (EF 20-25% in 07/2020 and felt to be secondary to CAD, tachycardia and ETOH use), CAD (s/p cath in 07/2020 showing CTO of RCA and LCx and medical management recommended), permanent atrial fibrillation, chronic hypotension and Stage 3 CKD who was admitted with likely septic shock and acute CHF exacerbation.    Assessment & Plan    1. Acute HFrEF - Repeat echo this admission shows his EF is at 20-25% which is similar to prior imaging with reduced RV function. Also noted to have a moderate pericardial effusion. Co-ox was at 62 on most recent check and felt to be in an acceptable range given his cardiomyopathy.  - He has been receiving IV Lasix 40mg  BID with I&O's not fully recorded. Weight peaked at 247 lbs this admission and down to 233 on 08/29/2021. Will ask for weight to be obtained today. Creatinine stable at 1.15 but K+ low at 3.2 (replacement already ordered). GDMT has been limited due  to his hypotension. Currently on Midodrine 15mg  TID and Digoxin 0.0625 mg daily. Could consider adding SGLT-2 inhibitor prior to discharge.  2. Shock - Felt to be most consistent with septic shock on admission with lactic acid at 7.3. Off pressor support  and on PO Midodrine. Management per the admitting team.   3. Persistent Atrial Fibrillation - Rates have been in the 80's to low-100's. Remains on Digoxin 0.0625mg  daily as BP has not allowed for BB therapy and no CCB given his cardiomyopathy.  - Continue Eliquis 5mg  BID for anticoagulation.   4. CAD - Prior cath in 07/2020 showed CTO of RCA and LCx and medical management recommended. No ASA given the need for anticoagulation and BP does not allow for BB therapy. Statin therapy initially held due to LFT's. Will check LFT's with AM labs and would restart his statin if they have normalized.   5. AKI - Creatinine peaked at 1.82 this admission. Improved to 1.15 today. Continue to follow with diuresis.   6. Chronic Hypotension - BP stable over the past 24 hours, at 107/71 on most recent check. Remains on Midodrine 15mg  TID.    For questions or updates, please contact [place] Please consult [email or web address] for contact info under        Signed, Erma Heritage, PA-C  08/31/2021, 9:59 AM    Patient seen and examined   I agree with findings as noted above by B Strader  Pt currently sitting in chair   APpears fairly comfortable  Neck  No JVD appreciable in chair Lungs  with wheezes, rhonchi Cardiac exam   Irreg irreg   No S3  Abd is soft Ext with 1-2+ LE edema bilaterally  \  Pt with severe LV dysfunction, mild RV dysfunction   Moderate pericardial effusion on 1/31   Continues on IV lasix    VOlume is still increased   Would continue IV diuresis   Continue IV lasix    Strict I/O (patient hsa water bottle that is being refilled at bedside)  1500 CC per day  CMET in morning     Continue rate control and Eliquis  Taper Midodrine as  bp allows to decrease afterload   Would try 10 tid  Pt is DNR.  Dorris Carnes  MD

## 2021-08-31 NOTE — TOC Progression Note (Signed)
Transition of Care [facility]) - Progression Note    Patient Details  Name: York Valliant MRN: [id number] Date of Birth: [birth year]  Transition of Care [facility] CM/SW Contact  Ihor Gully, [place] Phone Number: 08/31/2021, 4:56 PM  Clinical Narrative:    Authorization is approved:  [id number] SNF 08/31/2021 09/01/2021-09/04/2021 Approved 4/4/0 09/04/2021  Expected Discharge Plan: [place] Barriers to Discharge: Continued Medical Work up  Expected Discharge Plan and Services Expected Discharge Plan: [place] In-house Referral: Clinical Social Work   Post Acute Care Choice: [place] arrangements for the past 2 months: [place]: RN, PT [facility] Agency: [place] ([place]) Date HH Agency Contacted: 08/27/21 Time [place]: [place] Representative spoke with at [place]: [place] Determinants of Health ([place]) Interventions    Readmission Risk Interventions Readmission Risk Prevention Plan 08/27/2021  Transportation Screening Complete  Medication Review Press photographer) Complete  HRI or [place] Complete  SW Recovery Care/Counseling Consult Complete  [place] Not Applicable  Some recent data might be hidden

## 2021-08-31 NOTE — Progress Notes (Signed)
Progress Note   Patient: Perry Hunter [id number]:[id number] DOB: [birth year] DOA: 08/24/2021     7 DOS: the patient was seen and examined on 08/31/2021   Brief hospital course: Perry Hunter is a 71 y.o. male with a history of diabetes, CHF, hypertension, COPD, paroxysmal atrial fibrillation on anticoagulation, BPH, obstructive sleep apnea not on CPAP.  Patient brought to the hospital for increasing confusion, shortness of breath, fever.  Patient was admitted with initial concerns for septic shock with unknown source with associated metabolic encephalopathy and AKI.  However, it appears that he may not have initially been septic and simply presented with shock-possibly cardiogenic.  No source of infection has been found and therefore antibiotics have been discontinued 1/31.  He was thought to have possible acute cholecystitis during the course of this hospitalization as well, however HIDA scan has come back negative.  He now appears quite volume overloaded and likely has acute on chronic systolic CHF exacerbation and has been started on IV Lasix with cardiology following.  Assessment and Plan: * Acute on chronic systolic CHF (congestive heart failure) (HCC) -Ejection fraction 20-25% -Continue to follow cardiology service recommendations. -Continue to follow daily weights and strict I's and O's -Continue IV diuresis for another 24 hours and follow clinical response. -After discussing with cardiology patient might benefit of starting SGLT-2 inhibitor prior to discharge. -Continue the use of digoxin -Unable to add beta-blocker therapy given hypotension.  Type 2 diabetes mellitus with complication, with long-term current use of insulin (HCC) -currently with hypoglycemia in the setting of decrease oral intake while using long term insulin. -following rec's by diabetes coordinator will discontinue long acting insulin for now -continue SSI and follow CBG's fluctuation now that his diet is advancing. -as  mentioned above planning to start SGLT-2 inhibitor   Physical deconditioning -Patient has been seen by physical therapy with recommendations for patient to go to short-term rehab at the skilled nursing facility at time of discharge. -Continue acute care. -patient need 48 hours with the use mittens prior to discharge to SNF.  Acute metabolic encephalopathy- (present on admission) -Resolving for the most part; patient with intermittent confusion status (maybe sundowning). -In the setting of metabolic encephalopathy and hypercapnia at time of admission. -Continue to wean oxygen supplementation off and continue providing constant reorientation.  AKI (acute kidney injury) ([place])- (present on admission) -Resolved -In the setting of hypotension and decreased perfusion -Currently stabilized and Cr within normal limits -Continue to follow renal function stability especially in the setting of acute diuresis. -advise to maintain adequate hydration.  Elevated LFTs- (present on admission) -In the setting of hepatic congestion and hypoperfusion with initial shock presentation. -LFTs normalizing -HIDA scan negative -continue supportive care; holding hepatotoxic agents currently.  Sepsis ([place])- (present on admission) -Sepsis has been ruled out -Patient presented with SIRS /cardiogenic shock at time of admission -No source of infection found -SIRS features has resolved; patient without antibiotics for the last 96 hours.  PAF (paroxysmal atrial fibrillation) (Perry Hunter)- (present on admission) -Rate well controlled -Continue Eliquis for secondary prevention -Continue to follow cardiology service for further recommendations. -Continue the use of digoxin.  Diabetes mellitus (Perry Hunter) -Continue modified carbohydrate diet -Continue sliding scale insulin. -considering starting SGLT-2 inhibitor; will follow CBG's  COPD (chronic obstructive pulmonary disease) (Perry Hunter)- (present on admission) -Stable overall;  mild expiratory wheezing appreciated on exam. -Continue as needed bronchodilators and home nebulizer/maintenance inhaler management. -Continue to follow respiratory status and wean off oxygen supplementation as tolerated.   -Goal is for O2 sat above  90%. -Currently using 1-2 L nasal cannula supplementation.  GERD (gastroesophageal reflux disease)- (present on admission) -Continue PPI  Essential hypertension- (present on admission) - Initially presented with cardiogenic shock and antihypertensive agents were held. -Transient use of peripheral pressors was required. -Currently stable and tolerating diuresis. -Continue to follow vital signs; overall soft but stable. -Heart healthy diet has been ordered.    Subjective:  Afebrile, no chest pain, no nausea, no vomiting, no palpitations.  Reports breathing continues to improve.  Less swelling appreciated on exam.  Using 1-2 L nasal [place] mentation.  Physical Exam: Vitals:   08/30/21 2037 08/31/21 0356 08/31/21 0830 08/31/21 1003  BP: 105/64 107/71    Pulse: (!) 102 98  79  Resp: 20     Temp: 98 F (36.7 C) (!) 97.5 F (36.4 C)    TempSrc: Oral Oral    SpO2: 94% (!) 89% 96%   Weight:      Height:       General exam: Alert, awake, oriented x 3, following commands appropriately.  Overnight with episode of confusion and sundowning.  No chest pain, no nausea, no vomiting. Respiratory system: Mild expiratory wheezing and rhonchi appreciated. Decreased breath sounds at the bases.  No using accessory muscles. Cardiovascular system: Rate controlled, no rubs, no gallops, no JVD on exam.   Gastrointestinal system: Abdomen is nondistended, soft and nontender. No organomegaly or masses felt. Normal bowel sounds heard. Central nervous system: Alert and oriented. No focal neurological deficits. Extremities: No cyanosis or clubbing; 2+ edema appreciated bilaterally. Skin: No petechiae. Psychiatry: Mood & affect appropriate.    Data  Reviewed: Chest x-ray has demonstrated vascular congestion; patient 2D echo ejection fraction 20 to 25% with global hypokinesis and elevated left atrial pressure.  No significant valvular disorder appreciated.  ABG demonstrating a pH of 7.42, CO2 51.3 PO2 89.2 HCO3 31.9  Magnesium within normal limits.  Telemetry demonstrating adequate rate controlled atrial fibrillation.  Family Communication: Daughter at bedside.  Disposition: Status is: Inpatient  Remains inpatient appropriate because: Still requiring IV diuresis to stabilize exacerbation of heart failure condition.  Planned Discharge Destination: Skilled nursing facility once medically stable.   Author: Barton Dubois, MD 08/31/2021 3:08 PM  For on call review [email or web address].

## 2021-09-01 LAB — GLUCOSE, CAPILLARY
Glucose-Capillary: 113 mg/dL — ABNORMAL HIGH (ref 70–99)
Glucose-Capillary: 110 mg/dL — ABNORMAL HIGH (ref 70–99)
Glucose-Capillary: 104 mg/dL — ABNORMAL HIGH (ref 70–99)
Glucose-Capillary: 105 mg/dL — ABNORMAL HIGH (ref 70–99)
Glucose-Capillary: 123 mg/dL — ABNORMAL HIGH (ref 70–99)
Glucose-Capillary: 120 mg/dL — ABNORMAL HIGH (ref 70–99)

## 2021-09-01 LAB — BLOOD GAS, ARTERIAL
Acid-Base Excess: 15.1 mmol/L — ABNORMAL HIGH (ref 0.0–2.0)
Bicarbonate: 38.1 mmol/L — ABNORMAL HIGH (ref 20.0–28.0)
Drawn by: 22179
FIO2: 28
O2 Saturation: 93.8 %
Patient temperature: 37
pCO2 arterial: 66.7 mmHg (ref 32.0–48.0)
pH, Arterial: 7.402 (ref 7.350–7.450)
pO2, Arterial: 70.4 mmHg — ABNORMAL LOW (ref 83.0–108.0)

## 2021-09-01 LAB — COMPREHENSIVE METABOLIC PANEL
ALT: 61 U/L — ABNORMAL HIGH (ref 0–44)
AST: 48 U/L — ABNORMAL HIGH (ref 15–41)
Albumin: 2.6 g/dL — ABNORMAL LOW (ref 3.5–5.0)
Alkaline Phosphatase: 93 U/L (ref 38–126)
Anion gap: 7 (ref 5–15)
BUN: 17 mg/dL (ref 8–23)
CO2: 38 mmol/L — ABNORMAL HIGH (ref 22–32)
Calcium: 8.1 mg/dL — ABNORMAL LOW (ref 8.9–10.3)
Chloride: 95 mmol/L — ABNORMAL LOW (ref 98–111)
Creatinine, Ser: 1.06 mg/dL (ref 0.61–1.24)
GFR, Estimated: >60 mL/min (ref >60)
Glucose, Bld: 101 mg/dL — ABNORMAL HIGH (ref 70–99)
Potassium: 3 mmol/L — ABNORMAL LOW (ref 3.5–5.1)
Sodium: 140 mmol/L (ref 135–145)
Total Bilirubin: 1.7 mg/dL — ABNORMAL HIGH (ref 0.3–1.2)
Total Protein: 6.4 g/dL — ABNORMAL LOW (ref 6.5–8.1)

## 2021-09-01 LAB — CBC
HCT: 28.1 % — ABNORMAL LOW (ref 39.0–52.0)
Hemoglobin: 7.6 g/dL — ABNORMAL LOW (ref 13.0–17.0)
MCH: 15.7 pg — ABNORMAL LOW (ref 26.0–34.0)
MCHC: 27 g/dL — ABNORMAL LOW (ref 30.0–36.0)
MCV: 58.2 fL — ABNORMAL LOW (ref 80.0–100.0)
Platelets: 520 10*3/uL — ABNORMAL HIGH (ref 150–400)
RBC: 4.83 MIL/uL (ref 4.22–5.81)
RDW: 23.9 % — ABNORMAL HIGH (ref 11.5–15.5)
WBC: 7.1 10*3/uL (ref 4.0–10.5)
nRBC: 1.5 % — ABNORMAL HIGH (ref 0.0–0.2)

## 2021-09-01 MED ORDER — ENSURE ENLIVE PO LIQD
237.0000 mL | Freq: Two times a day (BID) | ORAL | Status: DC
  Administered 2021-09-01 – 2021-09-04 (×5): 237 mL via ORAL

## 2021-09-01 MED ORDER — BUDESONIDE 0.5 MG/2ML IN SUSP
0.5000 mg | Freq: Two times a day (BID) | RESPIRATORY_TRACT | Status: DC
  Administered 2021-09-01: 0.5 mg via RESPIRATORY_TRACT
  Filled 2021-09-01 (×2): qty 2

## 2021-09-01 MED ORDER — ARFORMOTEROL TARTRATE 15 MCG/2ML IN NEBU
15.0000 ug | INHALATION_SOLUTION | Freq: Two times a day (BID) | RESPIRATORY_TRACT | Status: DC
  Administered 2021-09-01: 15 ug via RESPIRATORY_TRACT
  Filled 2021-09-01 (×2): qty 2

## 2021-09-01 MED ORDER — QUETIAPINE FUMARATE 25 MG PO TABS
12.5000 mg | ORAL_TABLET | Freq: Two times a day (BID) | ORAL | Status: DC
  Administered 2021-09-01 – 2021-09-04 (×6): 12.5 mg via ORAL
  Filled 2021-09-01 (×6): qty 1

## 2021-09-01 MED ORDER — ADULT MULTIVITAMIN W/MINERALS CH
1.0000 | ORAL_TABLET | Freq: Every day | ORAL | Status: DC
  Administered 2021-09-01 – 2021-09-02 (×2): 1 via ORAL
  Filled 2021-09-01 (×2): qty 1

## 2021-09-01 NOTE — Progress Notes (Signed)
Pts granddaughter called for a update

## 2021-09-01 NOTE — Progress Notes (Addendum)
Pt refuse to take breathing treatment medicine in cup but patient will not keep mask on and refuse to allow RT to put on. RT tried to talk patient into doing treatment but not happening. Nurse informed

## 2021-09-01 NOTE — Progress Notes (Signed)
Date and time results received: 09/01/21 1605   Test:pc02 Critical Value: 66.7  Name of Provider Notified: Dr. Dyann Kief  Waiting for follow up from [facility] page.

## 2021-09-01 NOTE — Progress Notes (Signed)
Pts granddaughter called for update.

## 2021-09-01 NOTE — Progress Notes (Signed)
Progress Note   Patient: Perry Hunter YYT:[id number] DOB: February 03, 1951 DOA: 08/24/2021     8 DOS: the patient was seen and examined on 09/01/2021   Brief hospital course: Deforest Maiden is a 71 y.o. male with a history of diabetes, CHF, hypertension, COPD, paroxysmal atrial fibrillation on anticoagulation, BPH, obstructive sleep apnea not on CPAP.  Patient brought to the hospital for increasing confusion, shortness of breath, fever.  Patient was admitted with initial concerns for septic shock with unknown source with associated metabolic encephalopathy and AKI.  However, it appears that he may not have initially been septic and simply presented with shock-possibly cardiogenic.  No source of infection has been found and therefore antibiotics have been discontinued 1/31.  He was thought to have possible acute cholecystitis during the course of this hospitalization as well, however HIDA scan has come back negative.  He now appears quite volume overloaded and likely has acute on chronic systolic CHF exacerbation and has been started on IV Lasix with cardiology following.  Assessment and Plan: * Acute on chronic systolic CHF (congestive heart failure) (HCC) -Ejection fraction 20-25% -Continue to follow cardiology service recommendations. -Continue to follow daily weights and strict I's and O's -Continue IV diuresis for another 24 hours; as patient is currently unable to take oral medications with current mentation.  -After discussing with cardiology patient might benefit of starting SGLT-2 inhibitor prior to discharge. -Continue the use of digoxin -Unable to add beta-blocker therapy given hypotension.  Type 2 diabetes mellitus with complication, with long-term current use of insulin (HCC) -currently with hypoglycemia in the setting of decrease oral intake while using long term insulin. -following rec's by diabetes coordinator will discontinue long acting insulin for now -continue SSI and follow CBG's  fluctuation now that his diet is advancing. -as mentioned above planning to start SGLT-2 inhibitor   Physical deconditioning -Patient has been seen by physical therapy with recommendations for patient to go to short-term rehab at the skilled nursing facility at time of discharge. -Continue acute care. -patient need 48 hours without the use of mittens prior to discharge to SNF.  Acute metabolic encephalopathy- (present on admission) -Intermittently experiencing sundowning; overnight with significant amount of Ativan required for restlessness and agitation. -Having concerns for hypercapnia at this point.  Will check ABG. -Patient presented at time of admission with metabolic encephalopathy and hypercapnia. -Continue to wean oxygen supplementation off and continue providing constant reorientation. -Avoid the use of benzodiazepines. -Low-dose Seroquel will be added to assist with sundowning and encephalopathy.  AKI (acute kidney injury) ([place])- (present on admission) -Resolved -In the setting of hypotension and decreased perfusion -Currently stabilized and Cr within normal limits -Continue to follow renal function stability especially in the setting of acute diuresis. -advise to maintain adequate hydration.  Elevated LFTs- (present on admission) -In the setting of hepatic congestion and hypoperfusion with initial shock presentation. -LFTs normalizing -HIDA scan negative -continue supportive care; holding hepatotoxic agents currently.  Sepsis ([place])- (present on admission) -Sepsis has been ruled out -Patient presented with SIRS /cardiogenic shock at time of admission -No source of infection found -SIRS features has resolved; patient without antibiotics for the last 96 hours.  PAF (paroxysmal atrial fibrillation) ([place])- (present on admission) -Rate well controlled -Continue Eliquis for secondary prevention -Continue to follow cardiology service for further recommendations. -Continue  the use of digoxin.  Diabetes mellitus ([place]) -Continue modified carbohydrate diet -Continue sliding scale insulin. -considering starting SGLT-2 inhibitor; will follow CBG's  COPD (chronic obstructive pulmonary disease) ([place])- (present on  admission) -No using accessory muscles; positive rhonchi and expiratory wheezing on exam. -Given current mentation unable to properly use inhaler; will provide nebulizer management. -Continue to follow respiratory status and wean off oxygen supplementation as tolerated.   -Goal is for O2 sat above 90%. -Currently using 1-2 L nasal cannula supplementation.  GERD (gastroesophageal reflux disease)- (present on admission) -Continue PPI  Essential hypertension- (present on admission) - Initially presented with cardiogenic shock and antihypertensive agents were held. -Transient use of peripheral pressors was required. -Currently stable and tolerating diuresis. -Continue to follow vital signs; overall soft but stable. -Heart healthy diet has been ordered.   Prognosis is guarded currently; patient is DNR and CODE STATUS has confusion with daughter and granddaughter at bedside.  Will follow response and continue clinical support.  Subjective:  Afebrile, no chest pain, no nausea or vomiting.  Patient is confused, disoriented, very somnolent and unable to follow commands.  Significantly restless overnight requiring 4 doses of Ativan.  Currently unsafe to take oral meds or diet.  Physical Exam: Vitals:   09/01/21 0700 09/01/21 0800 09/01/21 1254 09/01/21 1321  BP:  111/74 113/72   Pulse:   (!) 101   Resp:  20 18   Temp:    98.5 F (36.9 C)  TempSrc:    Oral  SpO2:  93% 98%   Weight: 101.4 kg     Height:       General exam: Confused, somnolent, unable to follow commands and currently no eating or drinking.  Patient received 4 doses of Ativan throughout the night secondary to restlessness. Respiratory system: Positive rhonchi and expiratory wheezing  appreciated on exam.  No using accessory muscle. Cardiovascular system: Rate controlled, no rubs, no gallops, no JVD. Gastrointestinal system: Abdomen is nondistended, soft and nontender. No organomegaly or masses felt. Normal bowel sounds heard. Central nervous system: Moving 4 limbs spontaneously; examination not able to be completed properly with current mentation. Extremities: No cyanosis or clubbing; 2+ edema appreciated bilaterally. Skin: No petechiae. Psychiatry: Unable to properly assess with current mentation.  Data Reviewed: Chest x-ray has demonstrated vascular congestion; patient 2D echo ejection fraction 20 to 25% with global hypokinesis and elevated left atrial pressure.  No significant valvular disorder appreciated.  ABG : Ordered and pending.  Telemetry demonstrating adequate rate controlled atrial fibrillation.  Family Communication: Daughter at bedside.  Disposition: Status is: Inpatient  Remains inpatient appropriate because: Still requiring IV diuresis to stabilize exacerbation of heart failure condition.  Planned Discharge Destination: Skilled nursing facility once medically stable.   Author: Barton Dubois, MD 09/01/2021 1:46 PM  For on call review [email or web address].

## 2021-09-01 NOTE — Progress Notes (Signed)
PT has not needed mittens or any type of restraint today. Pt has been able to be redirected. PT was also able to eat dinner and take night time medication.

## 2021-09-01 NOTE — Discharge Instructions (Signed)

## 2021-09-01 NOTE — Progress Notes (Signed)
Pt is now alert and was ambulated to the chair. Pt was cleaned up and peri care preformed. MD has ordered Cpap at night.

## 2021-09-01 NOTE — Progress Notes (Signed)
Pt disoriented x4 this morning.  Pt keeps pulling off tele and O2. Pt was situated in the bed and is resting. IV medication given will try again when pt is alert with PO medication.

## 2021-09-01 NOTE — Progress Notes (Signed)
Pt unable to generate adequate NIF for use of [employer] DPI this morning.

## 2021-09-01 NOTE — Progress Notes (Signed)
PT is currently not alert enough to take oral medications, MD notified, will try again later.

## 2021-09-01 NOTE — Progress Notes (Signed)
Nurse removed o2 pt was at 97% on 2L. PT dropped to 85% on RA 2L reapplied.

## 2021-09-01 NOTE — Progress Notes (Signed)
Pts granddaughter called for update

## 2021-09-01 NOTE — Progress Notes (Signed)
Initial Nutrition Assessment  DOCUMENTATION CODES:   Obesity unspecified  INTERVENTION:  -Ensure Enlive po BID, each supplement provides 350 kcal and 20 grams of protein. -MVI with minerals daily -education materials on CHF/low sodium diet provided. RD will follow-up as able/as appropriate to reinforce/re-attempt education  NUTRITION DIAGNOSIS:   Inadequate oral intake related to lethargy/confusion as evidenced by other (comment) (per RN report).  GOAL:   Patient will meet greater than or equal to 90% of their needs  MONITOR:   PO intake, Supplement acceptance, Labs, Weight trends, I & O's  REASON FOR ASSESSMENT:   Consult Diet education  ASSESSMENT:   Pt with PMH significant for CHF, HTN, COPD, DM, Afib, BPH, OSA not on CPAP admitted with acute on chronic CHF exacerbation  Consulted for diet education. RD working remotely and unable to reach pt via phone. Per RN, pt not alert enough at this time. Will place education material in AVS discharge summary and will re-attempt education as able/as appropriate.   Will use IBW to estimate needs at this time as pt has deep pitting edema to RLE and generalized moderate pitting edema per RN assessment. Additionally, RD unable to reach pt to obtain UBW/EDW.   PO Intake: 0-100%  (~54% avg meal intake)  UOP: 3x unmeasured occurrences x24 hours I/O: +397ml since admit  Medications: colace, IV lasix, SSI Q4H, protonix, miralax, klor-con, carafate Labs: Recent Labs  Lab 08/28/21 0354 08/29/21 0347 08/30/21 0521 08/31/21 0502 09/01/21 0448  NA 133* 135  --  138 140  K 4.1 3.2*  --  3.2* 3.0*  CL 100 99  --  98 95*  CO2 29 30  --  32 38*  BUN 31* 25*  --  21 17  CREATININE 1.06 1.09  --  1.15 1.06  CALCIUM 7.5* 7.8*  --  8.0* 8.1*  MG 2.1 1.8 2.0  --   --   GLUCOSE 47* 86  --  108* 101*  CBGs: 95-175 x24 hours    NUTRITION - FOCUSED PHYSICAL EXAM: Unable to perform at this time. Will attempt at follow-up.   Diet Order:    Diet Order             Diet Heart Room service appropriate? Yes; Fluid consistency: Thin; Fluid restriction: 1500 mL Fluid  Diet effective now                   EDUCATION NEEDS:   Not appropriate for education at this time  Skin:  Skin Assessment: Skin Integrity Issues: Skin Integrity Issues:: Other (Comment) Other: laceration R thigh  Last BM:  2/3  Height:   Ht Readings from Last 1 Encounters:  08/29/21 5\' 8"  (1.727 m)    Weight:   Wt Readings from Last 1 Encounters:  09/01/21 101.4 kg    Ideal Body Weight:  70 kg  BMI:  Body mass index is 33.99 kg/m.  Estimated Nutritional Needs:   Kcal:  2050-2250  Protein:  125-140 grams  Fluid:  >2L     Theone Stanley., MS, RD, LDN (she/her/hers) RD pager number and weekend/on-call pager number located in [place].

## 2021-09-02 LAB — BASIC METABOLIC PANEL
Anion gap: 7 (ref 5–15)
BUN: 16 mg/dL (ref 8–23)
CO2: 40 mmol/L — ABNORMAL HIGH (ref 22–32)
Calcium: 8 mg/dL — ABNORMAL LOW (ref 8.9–10.3)
Chloride: 95 mmol/L — ABNORMAL LOW (ref 98–111)
Creatinine, Ser: 1.06 mg/dL (ref 0.61–1.24)
GFR, Estimated: >60 mL/min (ref >60)
Glucose, Bld: 82 mg/dL (ref 70–99)
Potassium: 2.9 mmol/L — ABNORMAL LOW (ref 3.5–5.1)
Sodium: 142 mmol/L (ref 135–145)

## 2021-09-02 LAB — GLUCOSE, CAPILLARY
Glucose-Capillary: 100 mg/dL — ABNORMAL HIGH (ref 70–99)
Glucose-Capillary: 123 mg/dL — ABNORMAL HIGH (ref 70–99)
Glucose-Capillary: 145 mg/dL — ABNORMAL HIGH (ref 70–99)
Glucose-Capillary: 86 mg/dL (ref 70–99)
Glucose-Capillary: 94 mg/dL (ref 70–99)

## 2021-09-02 MED ORDER — GLYCOPYRROLATE 0.2 MG/ML IJ SOLN
0.2000 mg | INTRAMUSCULAR | Status: DC | PRN
  Administered 2021-09-03: 0.2 mg via INTRAVENOUS
  Filled 2021-09-02: qty 1

## 2021-09-02 MED ORDER — GLYCOPYRROLATE 1 MG PO TABS
1.0000 mg | ORAL_TABLET | ORAL | Status: DC | PRN
  Administered 2021-09-04: 1 mg via ORAL
  Filled 2021-09-02: qty 1

## 2021-09-02 MED ORDER — HALOPERIDOL LACTATE 2 MG/ML PO CONC: 0.5000 mg | ORAL | Status: DC | PRN

## 2021-09-02 MED ORDER — ONDANSETRON 4 MG PO TBDP: 4.0000 mg | ORAL_TABLET | Freq: Four times a day (QID) | ORAL | Status: DC | PRN

## 2021-09-02 MED ORDER — ONDANSETRON HCL 4 MG/2ML IJ SOLN: 4.0000 mg | Freq: Four times a day (QID) | INTRAMUSCULAR | Status: DC | PRN

## 2021-09-02 MED ORDER — POTASSIUM CHLORIDE CRYS ER 20 MEQ PO TBCR
40.0000 meq | EXTENDED_RELEASE_TABLET | ORAL | Status: AC
  Administered 2021-09-02 (×2): 40 meq via ORAL
  Filled 2021-09-02 (×3): qty 2

## 2021-09-02 MED ORDER — HALOPERIDOL 0.5 MG PO TABS
0.5000 mg | ORAL_TABLET | ORAL | Status: DC | PRN
  Administered 2021-09-03 – 2021-09-04 (×3): 0.5 mg via ORAL
  Filled 2021-09-02 (×3): qty 1

## 2021-09-02 MED ORDER — HALOPERIDOL LACTATE 5 MG/ML IJ SOLN
0.5000 mg | INTRAMUSCULAR | Status: DC | PRN
  Administered 2021-09-03: 0.5 mg via INTRAVENOUS
  Filled 2021-09-02: qty 1

## 2021-09-02 MED ORDER — BIOTENE DRY MOUTH MT LIQD: 15.0000 mL | OROMUCOSAL | Status: DC | PRN

## 2021-09-02 MED ORDER — ACETAMINOPHEN 650 MG RE SUPP: 650.0000 mg | Freq: Four times a day (QID) | RECTAL | Status: DC | PRN

## 2021-09-02 MED ORDER — ACETAMINOPHEN 325 MG PO TABS: 650.0000 mg | ORAL_TABLET | Freq: Four times a day (QID) | ORAL | Status: DC | PRN

## 2021-09-02 MED ORDER — HYDROMORPHONE HCL 1 MG/ML IJ SOLN
1.0000 mg | INTRAMUSCULAR | Status: DC | PRN
  Administered 2021-09-02 – 2021-09-04 (×8): 1 mg via INTRAVENOUS
  Filled 2021-09-02 (×8): qty 1

## 2021-09-02 MED ORDER — GLYCOPYRROLATE 0.2 MG/ML IJ SOLN: 0.2000 mg | INTRAMUSCULAR | Status: DC | PRN

## 2021-09-02 MED ORDER — POLYVINYL ALCOHOL 1.4 % OP SOLN: 1.0000 [drp] | Freq: Four times a day (QID) | OPHTHALMIC | Status: DC | PRN

## 2021-09-02 NOTE — Progress Notes (Signed)
Progress Note   Patient: Perry Hunter PNT:[id number] DOB: 02/06/1951 DOA: 08/24/2021     9 DOS: the patient was seen and examined on 09/02/2021   Brief hospital course: Perry Hunter is a 71 y.o. male with a history of diabetes, CHF, hypertension, COPD, paroxysmal atrial fibrillation on anticoagulation, BPH, obstructive sleep apnea not on CPAP.  Patient brought to the hospital for increasing confusion, shortness of breath, fever.  Patient was admitted with initial concerns for septic shock with unknown source with associated metabolic encephalopathy and AKI.  However, it appears that he may not have initially been septic and simply presented with shock-possibly cardiogenic.  No source of infection has been found and therefore antibiotics have been discontinued 1/31.  He was thought to have possible acute cholecystitis during the course of this hospitalization as well, however HIDA scan has come back negative.  He now appears quite volume overloaded and likely has acute on chronic systolic CHF exacerbation and has been started on IV Lasix with cardiology following.  Assessment and Plan: * Acute on chronic systolic CHF (congestive heart failure) (HCC) -Ejection fraction 20-25% -After long discussion with family members decision has been made to pursue comfort care and symptomatic management only. -All medications not intended to provide comfort has been discontinued.  Type 2 diabetes mellitus with complication, with long-term current use of insulin (HCC) - Sliding scale insulin and CBGs monitoring will be discontinued. -Plan is to focus on comfort care and symptomatic management.  Physical deconditioning -Patient has been seen by physical therapy with recommendations for patient to go to short-term rehab at [facility] -Condition has further deteriorates and at this moment there are no appears stable to pursue rehabilitation.  After discussion with family decision has been made to transition to comfort  care and symptomatic management only. -TOC has been consulted for residential hospice placement.  Acute metabolic encephalopathy- (present on admission) -Intermittently experiencing sundowning; most likely having some component of hypercapnic buildup episode. -Patient has continue refusing the use of CPAP/BiPAP.  At this moment even refusing nebulizer management. -Long discussion with family has triggered decision to pursued symptomatic management and comfort care only. -Low-dose Seroquel will be continue to assist with encephalopathy.  Agitation and restlessness will be tackle with the use of low-dose Haldol.  AKI (acute kidney injury) ([place])- (present on admission) -Resolved -At this moment no further blood draws will be done -Focusing on comfort care and symptomatic management.  Elevated LFTs- (present on admission) -In the setting of hepatic congestion and hypoperfusion with initial shock presentation. -LFTs normalizing -HIDA scan negative -Transitioning to full comfort care and symptomatic management only; will not pursuit any further blood draws.  Sepsis ([place])- (present on admission) -Sepsis has been ruled out -Patient presented with SIRS /cardiogenic shock at time of admission -No source of infection found -SIRS features has resolved -At this moment transitioning to full comfort care and symptomatic management only.  PAF (paroxysmal atrial fibrillation) ([place])- (present on admission) -Rate well controlled -Stopping Eliquis and digoxin -Plan is to transition to full comfort and symptomatic management only.  Diabetes mellitus ([place]) -Continue modified carbohydrate diet -Continue sliding scale insulin. -considering starting SGLT-2 inhibitor; will follow CBG's  COPD (chronic obstructive pulmonary disease) ([place])- (present on admission) -No using accessory muscles; positive rhonchi and expiratory wheezing appreciated. -At this point patient unable to properly use his home  inhalers and declining the use of nebulizer management. -Will continue to offer as needed medication and starting to use the use of dilaudid for air hunger and  SOB.   GERD (gastroesophageal reflux disease)- (present on admission) -Continue PPI  Essential hypertension- (present on admission) - Initially presented with cardiogenic shock and antihypertensive agents were held. -Transient use of peripheral pressors was required. -Blood pressure overall stable. -Transitioning to full comfort care and symptomatic management only.   Ethics/social: -Prognosis is very poor at this point; patient has started noted to be engaging into eating drinking or taking his medications in order to recover.  Long discussion with family members at bedside with decision to focus on comfort care and symptomatic management only.  TOC has been contacted to pursued residential hospice placement.   -Patient is DNR  Subjective:  Some decrease in his oral intake has been reported; continue to have intermittent fluctuation in mental status (with episode of agitation and restlessness).  Patient denies chest pain, reports intermittent coughing spells without the ability to clear airways.  Desaturating when not on oxygen supplementation.  Refusing medications intermittently and also respiratory treatment and CPAP/BiPAP.  Patient expressed that he would like to be comfortable and would like to go home.  Physical Exam: Vitals:   09/01/21 2231 09/02/21 0541 09/02/21 0731 09/02/21 1457  BP:  107/69  98/60  Pulse:  74  91  Resp:  18 18 20   Temp:  98.3 F (36.8 C)  98 F (36.7 C)  TempSrc:  Oral  Oral  SpO2: 98% 99% (!) 88% 99%  Weight:  101.2 kg    Height:       General exam: Alert, awake, oriented x 2 (oriented to person and place only; demonstrating poor insight.  Expressing that he would like to go home and just be comfortable.  No chest pain, no nausea, no vomiting.  Reporting difficulty breathing and having coughing  spells that he cannot debride off.  Per nursing report continue to have intermittent fluctuation in his mentation especially at nighttime; has been refusing medications and specifically CPAP/BiPAP. Respiratory system: Positive expiratory wheezing, diffuse rhonchi, no using accessory muscle. Cardiovascular system: Rate controlled, irregular rhythm; no rubs, no gallops, no JVD on exam. Gastrointestinal system: Abdomen is nondistended, soft and nontender. No organomegaly or masses felt. Normal bowel sounds heard. Central nervous system: Moving 4 limbs spontaneously; no focal deficit.  Limited examination due to altered mentation. Extremities: No cyanosis or clubbing; 2+ edema appreciated bilaterally. Skin: No petechiae. Psychiatry: Judgement and insight appear impaired in the setting of acute encephalopathic pathway.  Unable to properly assess.  During my examination mood was stable.  Data Reviewed: Chest x-ray has demonstrated vascular congestion; patient 2D echo ejection fraction 20 to 25% with global hypokinesis and elevated left atrial pressure.  No significant valvular disorder appreciated.  ABG : In the demonstrating CO2 in the 66 range; patient has declined the use of CPAP or BiPAP.  Stable renal function.  Potassium 2.9  Telemetry has been discontinued at this point.  Family Communication: Granddaughter and her husband at bedside.  Disposition: Status is: Inpatient  Remains inpatient appropriate because: Extensive goals of care and advance care planning discussion with family at bedside decision has been made to transition to comfort care and symptomatic management only.  Life expectancy less than 4-6 weeks.  TOC has been contacted for assistance with residential hospice.  Planned Discharge Destination: residential hospice.  Author: Barton Dubois, MD 09/02/2021 2:58 PM  For on call review [email or web address].

## 2021-09-02 NOTE — Progress Notes (Signed)
Patient stated "I can't breathe'" yet refused to take breathing treatment. He did reluctantly allow the [place] to be replaced, RA SpO2 88%.

## 2021-09-03 LAB — GLUCOSE, CAPILLARY: Glucose-Capillary: 134 mg/dL — ABNORMAL HIGH (ref 70–99)

## 2021-09-03 MED ORDER — ALBUTEROL SULFATE (2.5 MG/3ML) 0.083% IN NEBU
2.5000 mg | INHALATION_SOLUTION | Freq: Four times a day (QID) | RESPIRATORY_TRACT | Status: DC
  Administered 2021-09-03 – 2021-09-04 (×3): 2.5 mg via RESPIRATORY_TRACT
  Filled 2021-09-03 (×3): qty 3

## 2021-09-03 MED ORDER — ALBUTEROL SULFATE (2.5 MG/3ML) 0.083% IN NEBU
3.0000 mL | INHALATION_SOLUTION | Freq: Four times a day (QID) | RESPIRATORY_TRACT | Status: DC | PRN
  Administered 2021-09-03: 3 mL via RESPIRATORY_TRACT
  Filled 2021-09-03: qty 3

## 2021-09-03 NOTE — Progress Notes (Signed)
Nutrition Brief Note  Chart reviewed. Pt now transitioning to comfort care.  No further nutrition interventions planned at this time.  Please re-consult as needed.   Allie Ranie Chinchilla, RDN, LDN Clinical Nutrition  

## 2021-09-03 NOTE — Progress Notes (Signed)
Made follow up visit today with patient and granddaughter. Found patient somewhat agitated and confused. Granddaughter Mechele Claude was tearful today as she shared about Dvontae's rapid decline over the last few days. She shared that if [facility] did not have availability then she believes that with her mother, husband, and sisters help Rondey could be cared for at home with Hospice at home. I supported her in this and provided blessing an opportunity for life review. Mechele Claude remains grateful for support and this Chaplain will remain available in order to provide spiritual support and to assess for spiritual need.   Rev. Bennie Pierini, M.Div [phone]

## 2021-09-03 NOTE — TOC Progression Note (Signed)
Transition of Care [facility]) - Progression Note    Patient Details  Name: Perry Hunter MRN: [id number] Date of Birth: 1950/10/28  Transition of Care [facility]) CM/SW Contact  Ihor Gully, LCSW Phone Number: 09/03/2021, 1:01 PM  Clinical Narrative:    [name]. [name] with [name] spoke with patient's granddaughter and equipment has been ordered. Granddaughter requests that equipment be delivered to the home on 2/7.   Expected Discharge Plan: [place] Barriers to Discharge: Continued Medical Work up  Expected Discharge Plan and Services Expected Discharge Plan: [place] In-house Referral: Clinical Social Work   Post Acute Care Choice: [name] arrangements for the past 2 months: [place]: RN, PT [name] Agency: [name] ([name]) Date [place]: 08/27/21 Time [place]: [place] Representative spoke with at [place]: [place] Determinants of Health ([place]) Interventions    Readmission Risk Interventions Readmission Risk Prevention Plan 08/27/2021  Transportation Screening Complete  Medication Review Press photographer) Complete  HRI or [place] Complete  SW Recovery Care/Counseling Consult Complete  [place] Not Applicable  Some recent data might be hidden

## 2021-09-03 NOTE — TOC Progression Note (Signed)
Transition of Care [facility]) - Progression Note    Patient Details  Name: Perry Hunter MRN: [id number] Date of Birth: 02-Dec-1950  Transition of Care [facility]) CM/SW Contact  Ihor Gully, LCSW Phone Number: 09/03/2021, 10:02 AM  Clinical Narrative:    Patient has transitioned to comfort care. [facility]. If no beds are available, family wants to go home with hospice.  Per attending, referral made to [facility].   Expected Discharge Plan: [place] Barriers to Discharge: Continued Medical Work up  Expected Discharge Plan and Services Expected Discharge Plan: [place] In-house Referral: Clinical Social Work   Post Acute Care Choice: [place] arrangements for the past 2 months: [place]: RN, PT [place] Agency: [place] ([name]) Date [place]: 08/27/21 Time [place]: [place] Representative spoke with at [place]: [place] Determinants of Health ([place]) Interventions    Readmission Risk Interventions Readmission Risk Prevention Plan 08/27/2021  Transportation Screening Complete  Medication Review Press photographer) Complete  HRI or [place] Complete  SW Recovery Care/Counseling Consult Complete  [place] Not Applicable  Some recent data might be hidden

## 2021-09-03 NOTE — Progress Notes (Signed)
Progress Note   Patient: Perry Hunter MBW:[id number] DOB: 12-03-1950 DOA: 08/24/2021     10 DOS: the patient was seen and examined on 09/03/2021   Brief hospital course: Perry Hunter is a 71 y.o. male with a history of diabetes, CHF, hypertension, COPD, paroxysmal atrial fibrillation on anticoagulation, BPH, obstructive sleep apnea not on CPAP.  Patient brought to the hospital for increasing confusion, shortness of breath, fever.  Patient was admitted with initial concerns for septic shock with unknown source with associated metabolic encephalopathy and AKI.  However, it appears that he may not have initially been septic and simply presented with shock-possibly cardiogenic.  No source of infection has been found and therefore antibiotics have been discontinued 1/31.  He was thought to have possible acute cholecystitis during the course of this hospitalization as well, however HIDA scan has come back negative.  He now appears quite volume overloaded and likely has acute on chronic systolic CHF exacerbation and has been started on IV Lasix with cardiology following.  Assessment and Plan: * Acute on chronic systolic CHF (congestive heart failure) (HCC) -Ejection fraction 20-25% -After long discussion with family members decision has been made to pursue comfort care and symptomatic management only. -All medications not intended to provide comfort has been discontinued.  Type 2 diabetes mellitus with complication, with long-term current use of insulin (HCC) - Sliding scale insulin and CBGs monitoring will be discontinued. -Plan is to focus on comfort care and symptomatic management.  Physical deconditioning -Patient has been seen by physical therapy with recommendations for patient to go to short-term rehab at [facility]. -Condition has further deteriorates and at this moment there are no appears stable to pursue rehabilitation.  After discussion with family decision has been made to transition to comfort  care and symptomatic management only. -TOC has been consulted for residential hospice placement.  Acute metabolic encephalopathy- (present on admission) -Intermittently experiencing sundowning; most likely having some component of hypercapnic buildup episode. -Patient has continue refusing the use of CPAP/BiPAP.  At this moment even refusing nebulizer management. -Long discussion with family has triggered decision to pursued symptomatic management and comfort care only. -Low-dose Seroquel will be continue to assist with encephalopathy.  Agitation and restlessness will be tackle with the use of low-dose Haldol.  AKI (acute kidney injury) ([place])- (present on admission) -Resolved -At this moment no further blood draws will be done -Focusing on comfort care and symptomatic management.  Elevated LFTs- (present on admission) -In the setting of hepatic congestion and hypoperfusion with initial shock presentation. -LFTs normalizing -HIDA scan negative -Transitioning to full comfort care and symptomatic management only; will not pursuit any further blood draws.  Sepsis ([place])- (present on admission) -Sepsis has been ruled out -Patient presented with SIRS /cardiogenic shock at time of admission -No source of infection found -SIRS features has resolved -At this moment transitioning to full comfort care and symptomatic management only.  PAF (paroxysmal atrial fibrillation) ([place])- (present on admission) -Rate well controlled -Stopping Eliquis and digoxin -Plan is to transition to full comfort and symptomatic management only.  COPD (chronic obstructive pulmonary disease) ([place])- (present on admission) -No using accessory muscles; positive rhonchi and expiratory wheezing appreciated. -At this point patient unable to properly use his home inhalers and declining the use of nebulizer management. -Will continue to offer as needed medication and starting to use the use of dilaudid for air hunger and  SOB.   GERD (gastroesophageal reflux disease)- (present on admission) -Continue PPI  Essential hypertension- (present on admission) -  Initially presented with cardiogenic shock and antihypertensive agents were held. -Transient use of peripheral pressors was required. -Blood pressure overall stable. -Transitioning to full comfort care and symptomatic management only.   Diabetes mellitus (HCC)-resolved as of 09/03/2021 -Continue modified carbohydrate diet -Continue sliding scale insulin. -considering starting SGLT-2 inhibitor; will follow CBG's  Ethics/social: -Prognosis is very poor -Will continue comfort care and symptomatic management -Patient is DNR -Given lack of hospice beds in [place] family has opted to request needed equipment to take patient home with hospice. -TOC involved and helping with discharge plans.  Subjective:  Afebrile, slightly more comfortable today; still intermittently confused and having short winded sensation with minimal activity.  Decreased oral intake.  No complaining of pain.  Physical Exam: Vitals:   09/02/21 2131 09/03/21 0445 09/03/21 0839 09/03/21 1718  BP: 122/73 114/72    Pulse: 83 87    Resp: 19 17    Temp: 97.9 F (36.6 C) 97.9 F (36.6 C)    TempSrc: Oral Oral    SpO2: 100% 99% 98% 98%  Weight:      Height:       General exam: Alert, awake, oriented x 2, intermittently confused.  But appears to be more comfortable today. Respiratory system: Positive rhonchi bilaterally; mild expiratory wheezing appreciated.  No using accessory muscle. Cardiovascular system:RRR. No murmurs, rubs, gallops. Gastrointestinal system: Abdomen is nondistended, soft and nontender. No organomegaly or masses felt. Normal bowel sounds heard. Central nervous system: No focal deficits.  Moving 4 limbs spontaneously. Extremities: No cyanosis or clubbing;-2+ edema bilateral. Skin: No petechiae. Psychiatry: Judgement and insight appear impaired currently.   Stable mood during examination.   Data Reviewed: No further data to review as patient has been transitioned to full comfort care.  There is no more images or blood work anticipated.  Family Communication: Granddaughter and daughter updated at bedside.  Disposition: Status is: Inpatient  Overall prognosis continues to be poor.  Life expectancy less than 4 weeks. Given decrease transportation and social support and family members residential hospice will be only accepted if there is a bed in [place]; and there is no currently beds.  Family would like to take patient home with hospice once they receive appropriate equipment to care for him.  Hopefully discharge to 723.  Author: Barton Dubois, MD 09/03/2021 6:34 PM  For on call review [email or web address].

## 2021-09-04 DIAGNOSIS — Z515 Encounter for palliative care: Secondary | ICD-10-CM

## 2021-09-04 DIAGNOSIS — R531 Weakness: Secondary | ICD-10-CM

## 2021-09-04 MED ORDER — ALBUTEROL SULFATE (2.5 MG/3ML) 0.083% IN NEBU: 3.0000 mL | INHALATION_SOLUTION | Freq: Four times a day (QID) | RESPIRATORY_TRACT | 1 refills | Status: AC | PRN

## 2021-09-04 MED ORDER — HYOSCYAMINE SULFATE 0.125 MG SL SUBL: 0.1250 mg | SUBLINGUAL_TABLET | SUBLINGUAL | 0 refills | Status: AC | PRN

## 2021-09-04 MED ORDER — ONDANSETRON 8 MG PO TBDP: 8.0000 mg | ORAL_TABLET | Freq: Three times a day (TID) | ORAL | 0 refills | Status: AC | PRN

## 2021-09-04 MED ORDER — DOCUSATE SODIUM 100 MG PO CAPS: 100.0000 mg | ORAL_CAPSULE | Freq: Two times a day (BID) | ORAL | 0 refills | Status: AC

## 2021-09-04 MED ORDER — MORPHINE SULFATE (CONCENTRATE) 20 MG/ML PO SOLN: 10.0000 mg | ORAL | 0 refills | Status: AC | PRN

## 2021-09-04 MED ORDER — QUETIAPINE FUMARATE 25 MG PO TABS: 12.5000 mg | ORAL_TABLET | Freq: Two times a day (BID) | ORAL | 0 refills | Status: AC

## 2021-09-04 NOTE — TOC Transition Note (Addendum)
Transition of Care [facility]) - CM/SW Discharge Note   Patient Details  Name: Perry Hunter MRN: [id number] Date of Birth: [birth year]  [facility]) CM/SW Contact:  Salome Arnt, LCSW Phone Number: 09/04/2021, 12:09 PM   Clinical Narrative:  Pt d/c today and will return home with hospice. Per family, DME has been delivered. Hospice aware. Pt will transport via [facility]. RN updated. LCSW spoke with pt's daughter who confirmed address and that family will be present upon arrival.     Final next level of care: Home w Hospice Care Barriers to Discharge: Barriers Resolved   Patient Goals and CMS Choice Patient states their goals for this hospitalization and ongoing recovery are:: return home   Choice offered to / list presented to : Adult Children  Discharge Placement                  Name of family member notified: daughterOlivia Mackie Patient and family notified of of transfer: 09/04/21  Discharge Plan and Services In-house Referral: Clinical Social Work   Post Acute Care Choice: Home Health            DME Agency: [facility] Date DME Agency Contacted: 09/04/21   Representative spoke with at DME Agency: By hospice HH Arranged: RN, PT [place] Agency: [place] ([place]) Date HH Agency Contacted: 08/27/21 Time [place]: [place] Representative spoke with at [place]: [place] ([place]) Interventions     Readmission Risk Interventions Readmission Risk Prevention Plan 08/27/2021  Transportation Screening Complete  Medication Review Press photographer) Complete  HRI or [place] Complete  SW Recovery Care/Counseling Consult Complete  [place] Not Applicable  Some recent data might be hidden

## 2021-09-04 NOTE — Discharge Summary (Addendum)
Physician Discharge Summary   Patient: Perry Hunter MRN: [id number] DOB: 12-Nov-1950  Admit date:     08/24/2021  Discharge date: 09/04/21  Discharge Physician: Barton Dubois   PCP: Neale Burly, MD   Recommendations at discharge:  Comfort care and symptomatic management. No future hospitalizations if possible. Patient has been discharged home with hospice.  Discharge Diagnoses: Principal Problem:   Acute on chronic systolic CHF (congestive heart failure) (HCC) Active Problems:   Essential hypertension   GERD (gastroesophageal reflux disease)   COPD (chronic obstructive pulmonary disease) (HCC)   PAF (paroxysmal atrial fibrillation) (HCC)   Sepsis (HCC)   Elevated LFTs   AKI (acute kidney injury) ([place])   Acute metabolic encephalopathy   Physical deconditioning   Type 2 diabetes mellitus with complication, with long-term current use of insulin (HCC)   Weakness   End of life care  Brief hospital admission narrative Perry Hunter is a 71 y.o. male with a history of diabetes, CHF, hypertension, COPD, paroxysmal atrial fibrillation on anticoagulation, BPH, obstructive sleep apnea not on CPAP.  Patient brought to the hospital for increasing confusion, shortness of breath, fever.  Patient was admitted with initial concerns for septic shock with unknown source with associated metabolic encephalopathy and AKI.  However, it appears that he may not have initially been septic and simply presented with shock-possibly cardiogenic.  No source of infection has been found and therefore antibiotics have been discontinued 1/31.  He was thought to have possible acute cholecystitis during the course of this hospitalization as well, however HIDA scan has come back negative.  He now appears quite volume overloaded and likely has acute on chronic systolic CHF exacerbation and has been started on IV Lasix with cardiology following.  Assessment and Plan: * Acute on chronic systolic CHF (congestive heart  failure) (HCC) -Ejection fraction 20-25% -After long discussion with family members decision has been made to pursue comfort care and symptomatic management only. -All medications not intended to provide comfort has been discontinued.  Type 2 diabetes mellitus with complication, with long-term current use of insulin (HCC) - Sliding scale insulin and CBGs monitoring will be discontinued. -Plan is to focus on comfort care and symptomatic management.  Physical deconditioning -Patient has been seen by physical therapy with recommendations for patient to go to short-term rehab at [facility]. -Condition has further deteriorates and at this moment there are no appears stable to pursue rehabilitation.  After discussion with family decision has been made to transition to comfort care and symptomatic management only. -TOC has been consulted for residential hospice placement.  Acute metabolic encephalopathy- (present on admission) -Intermittently experiencing sundowning; most likely having some component of hypercapnic buildup episode. -Patient has continue refusing the use of CPAP/BiPAP.  At this moment even refusing nebulizer management. -Long discussion with family has triggered decision to pursued symptomatic management and comfort care only. -Low-dose Seroquel will be continue to assist with encephalopathy.  Agitation and restlessness will be tackle with the use of low-dose Haldol.  AKI (acute kidney injury) ([place])- (present on admission) -Resolved -At this moment no further blood draws will be done -Focusing on comfort care and symptomatic management.  Elevated LFTs- (present on admission) -In the setting of hepatic congestion and hypoperfusion with initial shock presentation. -LFTs normalizing -HIDA scan negative -Transitioning to full comfort care and symptomatic management only; will not pursuit any further blood draws.  Sepsis ([place])- (present on admission) -Sepsis has been ruled out -Patient  presented with SIRS /cardiogenic shock at time of admission -  No source of infection found -SIRS features has resolved -At this moment transitioning to full comfort care and symptomatic management only.  PAF (paroxysmal atrial fibrillation) ([place])- (present on admission) -Rate well controlled -Stopping Eliquis and digoxin -Plan is to transition to full comfort and symptomatic management only.  COPD (chronic obstructive pulmonary disease) ([place])- (present on admission) -No using accessory muscles; positive rhonchi and expiratory wheezing appreciated. -At this point patient unable to properly use his home inhalers and declining the use of nebulizer management. -Will continue to offer as needed medication and starting to use the use of dilaudid for air hunger and SOB.  GERD (gastroesophageal reflux disease)- (present on admission) -Continue PPI  Essential hypertension- (present on admission) - Initially presented with cardiogenic shock and antihypertensive agents were held. -Transient use of peripheral pressors was required. -Blood pressure overall stable. -Transitioning to full comfort care and symptomatic management only.  Ethics/social: -Prognosis is very poor -Will continue comfort care and symptomatic management -Patient is DNR -Given lack of hospice beds in [place] family has opted to request needed equipment to take patient home with hospice services. -TOC involved and helping with discharge plans.  Anemia of chronic disease -No overt bleeding -Last hemoglobin check was 7.6 -No planning for future blood draws or transfusion. -Planning for full comfort care and symptomatic management only.  Consultants: Cardiology service, bilateral of care and hospice. Procedures performed: See below for x-ray reports. Disposition: Patient has been discharged home with hospice care. Diet recommendation: Comfort feeding or pain attention to sodium intake.  DISCHARGE MEDICATION: Allergies  as of 09/04/2021   No Known Allergies      Medication List     STOP taking these medications    amoxicillin-clavulanate 875-125 MG tablet Commonly known as: AUGMENTIN   Digoxin 62.5 MCG Tabs   Eliquis 5 MG Tabs tablet Generic drug: apixaban   ferrous sulfate 325 (65 FE) MG tablet   Fish Oil 1000 MG Caps   fluticasone furoate-vilanterol 200-25 MCG/INH Aepb Commonly known as: BREO ELLIPTA   folic acid 1 MG tablet Commonly known as: FOLVITE   furosemide 40 MG tablet Commonly known as: LASIX   HYDROcodone-acetaminophen 5-325 MG tablet Commonly known as: NORCO/VICODIN   insulin detemir 100 UNIT/ML injection Commonly known as: LEVEMIR   metFORMIN 500 MG tablet Commonly known as: GLUCOPHAGE   midodrine 5 MG tablet Commonly known as: PROAMATINE   ondansetron 4 MG tablet Commonly known as: ZOFRAN   potassium chloride SA 20 MEQ tablet Commonly known as: KLOR-CON M   rosuvastatin 20 MG tablet Commonly known as: CRESTOR   sitaGLIPtin 100 MG tablet Commonly known as: JANUVIA   sucralfate 1 GM/10ML suspension Commonly known as: CARAFATE   thiamine 100 MG tablet   Vitamin D3 25 MCG (1000 UT) Caps       TAKE these medications    acetaminophen 650 MG CR tablet Commonly known as: TYLENOL Take 1,300 mg by mouth daily.   docusate sodium 100 MG capsule Commonly known as: COLACE Take 1 capsule (100 mg total) by mouth 2 (two) times daily.   finasteride 5 MG tablet Commonly known as: Proscar Take 1 tablet (5 mg total) by mouth daily.   hyoscyamine 0.125 MG SL tablet Commonly known as: LEVSIN SL Place 1 tablet (0.125 mg total) under the tongue every 4 (four) hours as needed (excess oral secretions).   morphine 20 MG/ML concentrated solution Commonly known as: ROXANOL Take 0.5 mLs (10 mg total) by mouth every 4 (four) hours as needed for  severe pain, anxiety or shortness of breath. May give sublingually if needed.   ondansetron 8 MG disintegrating  tablet Commonly known as: ZOFRAN-ODT Take 1 tablet (8 mg total) by mouth every 8 (eight) hours as needed for nausea or vomiting.   pantoprazole 40 MG tablet Commonly known as: PROTONIX Take 1 tablet (40 mg total) by mouth 2 (two) times daily.   ProAir HFA 108 (90 Base) MCG/ACT inhaler Generic drug: albuterol Inhale 2 puffs into the lungs 4 (four) times daily as needed for shortness of breath. What changed: Another medication with the same name was added. Make sure you understand how and when to take each.   albuterol (2.5 MG/3ML) 0.083% nebulizer solution Commonly known as: PROVENTIL Inhale 3 mLs into the lungs every 6 (six) hours as needed for shortness of breath or wheezing. What changed: You were already taking a medication with the same name, and this prescription was added. Make sure you understand how and when to take each.   QUEtiapine 25 MG tablet Commonly known as: SEROQUEL Take 0.5 tablets (12.5 mg total) by mouth 2 (two) times daily.   tamsulosin 0.4 MG Caps capsule Commonly known as: FLOMAX Take 0.8 mg by mouth every evening.   umeclidinium bromide 62.5 MCG/INH Aepb Commonly known as: INCRUSE ELLIPTA Inhale 1 puff into the lungs daily.               Durable Medical Equipment  (From admission, onward)           Start     Ordered   09/04/21 1358  For home use only DME Nebulizer machine  Once       Question Answer Comment  Patient needs a nebulizer to treat with the following condition COPD (chronic obstructive pulmonary disease) ([place])   Length of Need 6 Months      09/04/21 1357            Contact information for follow-up providers     [facility] Follow [facility].   Specialty: Home Health Services Why: Will contact you to schedule home health visits.             Contact information for after-discharge care     [facility] .   Service: Skilled Nursing Contact  information: [address] [place] [phone]                     Discharge Exam: Danley Danker Weights   08/31/21 1003 09/01/21 0700 09/02/21 0541  Weight: 105.1 kg 101.4 kg 101.2 kg   General exam: Chronically ill in appearance; good saturation on oxygen supplementation (2 L).  In no acute distress.  Appears comfortable. Respiratory system: Positive rhonchi bilaterally; mild expiratory wheezing appreciated.  No using accessory muscle. Cardiovascular system:RRR. No murmurs, rubs, gallops.  No JVD appreciated on exam. Gastrointestinal system: Abdomen is nondistended, soft and nontender. No organomegaly or masses felt. Normal bowel sounds heard. Central nervous system: No focal deficits.  Patient was very somnolent on examination. Extremities: No cyanosis or clubbing;1-2+ edema bilateral. Skin: No petechiae. Psychiatry: Unable to properly assess on today's exam; patient was somnolent.    Condition at discharge: stable  The results of significant diagnostics from this hospitalization (including imaging, microbiology, ancillary and laboratory) are listed below for reference.   Imaging Studies: CT CHEST ABDOMEN PELVIS W CONTRAST  Result Date: 08/24/2021 CLINICAL DATA:  Sepsis, worsening mental status, confusion  EXAM: CT CHEST, ABDOMEN, AND PELVIS WITH CONTRAST TECHNIQUE: Multidetector CT imaging of the chest, abdomen and pelvis was performed following the standard protocol during bolus administration of intravenous contrast. RADIATION DOSE REDUCTION: This exam was performed according to the departmental dose-optimization program which includes automated exposure control, adjustment of the mA and/or kV according to patient size and/or use of iterative reconstruction technique. CONTRAST:  59mL OMNIPAQUE IOHEXOL 300 MG/ML  SOLN COMPARISON:  08/24/2021, 08/30/2020 FINDINGS: CT CHEST FINDINGS Cardiovascular: The heart is enlarged, with trace pericardial effusion.  There is marked atherosclerosis of the coronary vasculature. No evidence of thoracic aortic aneurysm or dissection. Moderate atherosclerosis of the aortic arch. Mediastinum/Nodes: Precarinal adenopathy measuring up to 17 mm in short axis. Subcentimeter lymph nodes are seen elsewhere within the mediastinum. No hilar or axillary adenopathy. Thyroid, trachea, and esophagus are grossly unremarkable. Lungs/Pleura: There are small bilateral pleural effusions, right greater than left. No acute airspace disease. No pneumothorax. Central airways are patent. Musculoskeletal: No acute or destructive bony lesions. Reconstructed images demonstrate no additional findings. CT ABDOMEN PELVIS FINDINGS Hepatobiliary: No focal liver abnormality is seen. No gallstones, gallbladder wall thickening, or biliary dilatation. Pancreas: Unremarkable. No pancreatic ductal dilatation or surrounding inflammatory changes. Spleen: Normal in size without focal abnormality. Adrenals/Urinary Tract: Adrenal glands are unremarkable. Kidneys are normal, without renal calculi, focal lesion, or hydronephrosis. Bladder is unremarkable. Stomach/Bowel: No bowel obstruction or ileus. Normal appendix right lower quadrant. No bowel wall thickening or inflammatory change. Vascular/Lymphatic: Aortic atherosclerosis. No enlarged abdominal or pelvic lymph nodes. Reproductive: Prostate is unremarkable. Other: Trace ascites most pronounced in the upper abdomen. No free intraperitoneal gas. No abdominal wall hernia. Musculoskeletal: No acute or destructive bony lesions. Reconstructed images demonstrate no additional findings. IMPRESSION: Chest: 1. Cardiomegaly, with trace pericardial effusion. 2. Small bilateral pleural effusions, right greater the left. 3. Mediastinal lymphadenopathy. 4. Aortic Atherosclerosis (ICD10-I70.0). Coronary artery atherosclerosis. Abdomen/pelvis: 1. Trace ascites. 2. Otherwise no acute intra-abdominal or intrapelvic process. 3.  Aortic  Atherosclerosis (ICD10-I70.0). Electronically Signed   By: Randa Ngo M.D.   On: 08/24/2021 17:49   NM Hepato W/EF  Result Date: 08/27/2021 CLINICAL DATA:  Abdominal pain. EXAM: NUCLEAR MEDICINE HEPATOBILIARY IMAGING WITH GALLBLADDER EF TECHNIQUE: Sequential images of the abdomen were obtained out to 60 minutes following intravenous administration of radiopharmaceutical. After oral ingestion of Ensure, gallbladder ejection fraction was determined. At 60 min, normal ejection fraction is greater than 33%. RADIOPHARMACEUTICALS:  mCi Tc-108m  Choletec IV COMPARISON:  None. FINDINGS: Prompt uptake and biliary excretion of activity by the liver is seen. Gallbladder activity is visualized, consistent with patency of cystic duct. Biliary activity passes into small bowel, consistent with patent common bile duct. Calculated gallbladder ejection fraction is 57%. (Normal gallbladder ejection fraction with Ensure is greater than 33%.) IMPRESSION: 1. Normal gallbladder ejection fraction. 2. Patent cystic duct and common bile duct. Electronically Signed   By: Suzy Bouchard M.D.   On: 08/27/2021 16:57   DG CHEST PORT 1 VIEW  Result Date: 08/25/2021 CLINICAL DATA:  Edema. HX of asthma, diabetes and hypertension. EXAM: PORTABLE CHEST 1 VIEW COMPARISON:  08/24/2021 and older studies. FINDINGS: Moderate to marked enlargement of the cardiopericardial silhouette, stable. Persistent vascular congestion and interstitial thickening, without convincing change from the previous day's study. Additional opacity at the lung bases is consistent with small effusions, more evident on the right. No pneumothorax. Left internal jugular central venous line is stable. IMPRESSION: 1. No convincing change from the previous day's study. 2. Enlarged cardiopericardial silhouette consistent  with a combination of cardiomegaly and pericardial fluid as noted on the previous day's CT. 3. Right greater than left pleural effusions and vascular  congestion with interstitial thickening. Findings support congestive heart failure and interstitial edema. Electronically Signed   By: Lajean Manes M.D.   On: 08/25/2021 09:40   DG Chest Port 1 View  Result Date: 08/24/2021 CLINICAL DATA:  Possible sepsis.  Altered mental status. EXAM: PORTABLE CHEST 1 VIEW COMPARISON:  09/05/2020 FINDINGS: Marked enlargement of the cardiopericardial silhouette, increased from prior. Pulmonary vascular congestion with diffuse bilateral interstitial prominence. Aortic atherosclerosis. No pleural effusion or pneumothorax. IMPRESSION: 1. Marked enlargement of the cardiopericardial silhouette, increased from prior. Pericardial effusion not excluded. 2. Pulmonary vascular congestion with diffuse bilateral interstitial prominence, likely representing mild edema. Electronically Signed   By: Davina Poke D.O.   On: 08/24/2021 16:30   DG Chest Port 1V same Day  Result Date: 08/24/2021 CLINICAL DATA:  Internal jugular catheter placement EXAM: PORTABLE CHEST 1 VIEW COMPARISON:  08/24/2021 FINDINGS: Frontal view of the chest demonstrates interval placement of a left internal jugular catheter tip overlying superior vena cava. The cardiac silhouette remains enlarged. Persistent central vascular congestion without airspace disease. The bilateral effusions seen on recent CT are not well visualized. No pneumothorax. Right costophrenic angle is excluded by collimation. IMPRESSION: 1. No complication after left internal jugular catheter placement. 2. Persistent central vascular congestion. 3. Cardiomegaly. Electronically Signed   By: Randa Ngo M.D.   On: 08/24/2021 22:49   ECHOCARDIOGRAM COMPLETE  Result Date: 08/28/2021    ECHOCARDIOGRAM REPORT   Patient Name:   ELDRIGE PITKIN Date of Exam: 08/28/2021 Medical Rec #:  [id number]     Height:       68.0 in Accession #:    [id number]    Weight:       247.1 lb Date of Birth:  02/17/1951     BSA:          2.236 m Patient Age:    51 years       BP:           140/92 mmHg Patient Gender: M             HR:           90 bpm. Exam Location:  [facility] Procedure: 2D Echo, Cardiac Doppler, Color Doppler and Intracardiac            Opacification Agent Indications:    CHF  History:        Patient has prior history of Echocardiogram examinations, most                 recent 08/26/2020. CHF, COPD, Arrythmias:Atrial Fibrillation;                 Risk Factors:Hypertension, Diabetes, Dyslipidemia and Former                 Smoker.  Sonographer:    Wenda Low Referring Phys: [id number] Willard D [facility]  Sonographer Comments: Patient is morbidly obese. Image acquisition challenging due to COPD. IMPRESSIONS  1. Left ventricular ejection fraction, by estimation, is 20 to 25%. The left ventricle has severely decreased function. The left ventricle demonstrates global hypokinesis. The left ventricular internal cavity size was moderately dilated. Left ventricular diastolic parameters are indeterminate. Elevated left atrial pressure.  2. Right ventricular systolic function is mildly reduced. The right ventricular size is moderately enlarged. There is moderately elevated pulmonary artery systolic pressure.  3. Left  atrial size was severely dilated.  4. Right atrial size was severely dilated.  5. Moderate pericardial effusion. The pericardial effusion is circumferential. There is no evidence of cardiac tamponade.  6. The mitral valve is normal in structure. Trivial mitral valve regurgitation. No evidence of mitral stenosis.  7. The aortic valve is tricuspid. There is moderate calcification of the aortic valve. There is moderate thickening of the aortic valve. Aortic valve regurgitation is mild.  8. Aortic dilatation noted. There is mild dilatation of the ascending aorta, measuring 37 mm.  9. The inferior vena cava is dilated in size with >50% respiratory variability, suggesting right atrial pressure of 8 mmHg. FINDINGS  Left Ventricle: Left ventricular ejection fraction, by  estimation, is 20 to 25%. The left ventricle has severely decreased function. The left ventricle demonstrates global hypokinesis. Definity contrast agent was given IV to delineate the left ventricular endocardial borders. The left ventricular internal cavity size was moderately dilated. There is no left ventricular hypertrophy. Left ventricular diastolic parameters are indeterminate. Elevated left atrial pressure. Right Ventricle: The right ventricular size is moderately enlarged. Right vetricular wall thickness was not well visualized. Right ventricular systolic function is mildly reduced. There is moderately elevated pulmonary artery systolic pressure. The tricuspid regurgitant velocity is 2.91 m/s, and with an assumed right atrial pressure of 15 mmHg, the estimated right ventricular systolic pressure is 70.6 mmHg. Left Atrium: Left atrial size was severely dilated. Right Atrium: Right atrial size was severely dilated. Pericardium: A moderately sized pericardial effusion is present. The pericardial effusion is circumferential. There is no evidence of cardiac tamponade. Mitral Valve: The mitral valve is normal in structure. There is mild thickening of the mitral valve leaflet(s). There is mild calcification of the mitral valve leaflet(s). Mild mitral annular calcification. Trivial mitral valve regurgitation. No evidence  of mitral valve stenosis. MV peak gradient, 9.7 mmHg. The mean mitral valve gradient is 3.0 mmHg. Tricuspid Valve: The tricuspid valve is not well visualized. Tricuspid valve regurgitation is mild . No evidence of tricuspid stenosis. Aortic Valve: The aortic valve is tricuspid. There is moderate calcification of the aortic valve. There is moderate thickening of the aortic valve. Aortic valve regurgitation is mild. Aortic valve mean gradient measures 5.5 mmHg. Aortic valve peak gradient measures 8.6 mmHg. Aortic valve area, by VTI measures 1.62 cm. Pulmonic Valve: The pulmonic valve was not well  visualized. Pulmonic valve regurgitation is not visualized. No evidence of pulmonic stenosis. Aorta: The aortic root is normal in size and structure and aortic dilatation noted. There is mild dilatation of the ascending aorta, measuring 37 mm. Venous: The inferior vena cava is dilated in size with greater than 50% respiratory variability, suggesting right atrial pressure of 8 mmHg. IAS/Shunts: No atrial level shunt detected by color flow Doppler.  LEFT VENTRICLE PLAX 2D LVIDd:         6.50 cm   Diastology LVIDs:         5.40 cm   LV e' lateral:   7.83 cm/s LV PW:         1.10 cm   LV E/e' lateral: 16.2 LV IVS:        0.90 cm LVOT diam:     2.10 cm LV SV:         46 LV SV Index:   21 LVOT Area:     3.46 cm  RIGHT VENTRICLE RV Basal diam:  3.55 cm RV Mid diam:    3.30 cm RV S prime:  11.40 cm/s TAPSE (M-mode): 1.9 cm LEFT ATRIUM              Index        RIGHT ATRIUM           Index LA diam:        6.00 cm  2.68 cm/m   RA Area:     30.60 cm LA Vol (A2C):   117.0 ml 52.32 ml/m  RA Volume:   109.00 ml 48.75 ml/m LA Vol (A4C):   111.0 ml 49.64 ml/m LA Biplane Vol: 118.0 ml 52.77 ml/m  AORTIC VALVE                     PULMONIC VALVE AV Area (Vmax):    1.71 cm      PV Vmax:       0.67 m/s AV Area (Vmean):   1.58 cm      PV Peak grad:  1.8 mmHg AV Area (VTI):     1.62 cm AV Vmax:           147.00 cm/s AV Vmean:          106.000 cm/s AV VTI:            0.285 m AV Peak Grad:      8.6 mmHg AV Mean Grad:      5.5 mmHg LVOT Vmax:         72.40 cm/s LVOT Vmean:        48.450 cm/s LVOT VTI:          0.133 m LVOT/AV VTI ratio: 0.47  AORTA Ao Root diam: 3.60 cm Ao Asc diam:  3.70 cm MITRAL VALVE                TRICUSPID VALVE MV Area (PHT): 3.76 cm     TR Peak grad:   33.9 mmHg MV Peak grad:  9.7 mmHg     TR Vmax:        291.00 cm/s MV Mean grad:  3.0 mmHg MV Vmax:       1.56 m/s     SHUNTS MV Vmean:      66.8 cm/s    Systemic VTI:  0.13 m MV Decel Time: 202 msec     Systemic Diam: 2.10 cm MV E velocity: 127.00 cm/s  Carlyle Dolly MD Electronically signed by Carlyle Dolly MD Signature Date/Time: 08/28/2021/1:15:22 PM    Final    US Abdomen Limited RUQ (LIVER/GB)  Result Date: 08/26/2021 CLINICAL DATA:  Transaminitis. EXAM: ULTRASOUND ABDOMEN LIMITED RIGHT UPPER QUADRANT COMPARISON:  August 24, 2021.  August 27, 2020. FINDINGS: Gallbladder: No cholelithiasis is noted. Moderate to severe gallbladder wall thickening is noted measuring 7 mm. Mild ascites is noted. Common bile duct: Diameter: 6 mm which is within normal limits. Liver: No focal lesion identified. Mildly increased echogenicity of hepatic parenchyma is noted suggesting hepatic steatosis or other diffuse hepatocellular disease. Portal vein is patent on color Doppler imaging with normal direction of blood flow towards the liver. Other: None. IMPRESSION: Mildly increased echogenicity of hepatic parenchyma is noted suggesting hepatic steatosis or other diffuse hepatocellular disease. Moderate to severe gallbladder wall thickening is noted without cholelithiasis. It is uncertain if this is due to cholecystitis or adjacent hepatocellular disease. Mild ascites is noted in the right upper quadrant. No definite biliary dilatation is noted. Electronically Signed   By: Marijo Conception M.D.   On: 08/26/2021 11:53    Microbiology: Results for  orders placed or performed during the hospital encounter of 08/24/21  Blood Culture (routine x 2)     Status: None   Collection Time: 08/24/21  4:02 PM   Specimen: Right Antecubital; Blood  Result Value Ref Range Status   Specimen Description   Final    RIGHT ANTECUBITAL BOTTLES DRAWN AEROBIC AND ANAEROBIC   Special Requests Blood Culture adequate volume  Final   Culture   Final    NO GROWTH 5 DAYS Performed at [facility], [address] [place], [place] [address]    Report Status 08/29/2021 FINAL  Final  Blood Culture (routine x 2)     Status: None   Collection Time: 08/24/21  4:07 PM   Specimen: BLOOD LEFT FOREARM   Result Value Ref Range Status   Specimen Description   Final    BLOOD LEFT FOREARM BOTTLES DRAWN AEROBIC AND ANAEROBIC   Special Requests   Final    Blood Culture results may not be optimal due to an excessive volume of blood received in culture bottles   Culture   Final    NO GROWTH 5 DAYS Performed at [facility], [address] [place], [place] [address]    Report Status 08/29/2021 FINAL  Final  Resp Panel by RT-PCR (Flu A&B, Covid) Nasopharyngeal Swab     Status: None   Collection Time: 08/24/21  5:52 PM   Specimen: Nasopharyngeal Swab; Nasopharyngeal(NP) swabs in vial transport medium  Result Value Ref Range Status   SARS Coronavirus 2 by RT PCR NEGATIVE NEGATIVE Final    Comment: (NOTE) SARS-CoV-2 target nucleic acids are NOT DETECTED.  The SARS-CoV-2 RNA is generally detectable in upper respiratory specimens during the acute phase of infection. The lowest concentration of SARS-CoV-2 viral copies this assay can detect is 138 copies/mL. A negative result does not preclude SARS-Cov-2 infection and should not be used as the sole basis for treatment or other patient management decisions. A negative result may occur with  improper specimen collection/handling, submission of specimen other than nasopharyngeal swab, presence of viral mutation(s) within the areas targeted by this assay, and inadequate number of viral copies(<138 copies/mL). A negative result must be combined with clinical observations, patient history, and epidemiological information. The expected result is Negative.  Fact Sheet for Patients:  EntrepreneurPulse.com.au  Fact Sheet for Healthcare Providers:  IncredibleEmployment.be  This test is no t yet approved or cleared by the [place] FDA and  has been authorized for detection and/or diagnosis of SARS-CoV-2 by FDA under an Emergency Use Authorization (EUA). This EUA will remain  in effect (meaning this test can be  used) for the duration of the COVID-19 declaration under Section 564(b)(1) of the Act, 21 U.S.C.section 360bbb-3(b)(1), unless the authorization is terminated  or revoked sooner.       Influenza A by PCR NEGATIVE NEGATIVE Final   Influenza B by PCR NEGATIVE NEGATIVE Final    Comment: (NOTE) The Xpert Xpress SARS-CoV-2/FLU/RSV plus assay is intended as an aid in the diagnosis of influenza from Nasopharyngeal swab specimens and should not be used as a sole basis for treatment. Nasal washings and aspirates are unacceptable for Xpert Xpress SARS-CoV-2/FLU/RSV testing.  Fact Sheet for Patients: EntrepreneurPulse.com.au  Fact Sheet for Healthcare Providers: IncredibleEmployment.be  This test is not yet approved or cleared by the [place] FDA and has been authorized for detection and/or diagnosis of SARS-CoV-2 by FDA under an Emergency Use Authorization (EUA). This EUA will remain in effect (meaning this test can be  used) for the duration of the COVID-19 declaration under Section 564(b)(1) of the Act, 21 U.S.C. section 360bbb-3(b)(1), unless the authorization is terminated or revoked.  Performed at [facility], [address], [place], [place] [address]   MRSA Next Gen by PCR, Nasal     Status: Abnormal   Collection Time: 08/24/21  8:20 PM   Specimen: Nasal Mucosa; Nasal Swab  Result Value Ref Range Status   MRSA by PCR Next Gen DETECTED (A) NOT DETECTED Final    Comment: RESULT CALLED TO, READ BACK BY AND VERIFIED WITH: TINAJERO,E @ 4782 ON 08/25/21 BY JUW (NOTE) The GeneXpert MRSA Assay (FDA approved for NASAL specimens only), is one component of a comprehensive MRSA colonization surveillance program. It is not intended to diagnose MRSA infection nor to guide or monitor treatment for MRSA infections. Test performance is not FDA approved in patients less than 1 years old. Performed at [facility], [address] [place], [place]  [address]   Urine Culture     Status: None   Collection Time: 08/25/21 12:28 AM   Specimen: In/Out Cath Urine  Result Value Ref Range Status   Specimen Description   Final    IN/OUT CATH URINE Performed at [facility], [address] [place], [place] [address]    Special Requests   Final    NONE Performed at [facility], [address] [place], [place] [address]    Culture   Final    NO GROWTH Performed at [facility], [place] [address] [place], [place] [address]    Report Status 08/26/2021 FINAL  Final    Labs: CBC: Recent Labs  Lab 08/29/21 0347 09/01/21 0448  WBC 9.2 7.1  HGB 7.7* 7.6*  HCT 28.7* 28.1*  MCV 59.1* 58.2*  PLT 397 841*   Basic Metabolic Panel: Recent Labs  Lab 08/29/21 0347 08/30/21 0521 08/31/21 0502 09/01/21 0448 09/02/21 0600  NA 135  --  138 140 142  K 3.2*  --  3.2* 3.0* 2.9*  CL 99  --  98 95* 95*  CO2 30  --  32 38* 40*  GLUCOSE 86  --  108* 101* 82  BUN 25*  --  21 17 16   CREATININE 1.09  --  1.15 1.06 1.06  CALCIUM 7.8*  --  8.0* 8.1* 8.0*  MG 1.8 2.0  --   --   --    Liver Function Tests: Recent Labs  Lab 08/29/21 0347 09/01/21 0448  AST 167* 48*  ALT 132* 61*  ALKPHOS 100 93  BILITOT 1.6* 1.7*  PROT 6.4* 6.4*  ALBUMIN 2.7* 2.6*   CBG: Recent Labs  Lab 09/02/21 0042 09/02/21 0350 09/02/21 0748 09/02/21 1148 09/02/21 1648  GLUCAP 100* 86 94 123* 145*    Discharge time spent: greater than 30 minutes.  Signed: Barton Dubois, MD [facility] 09/04/2021
